# Patient Record
Sex: Female | Born: 1955 | Race: White | Hispanic: No | Smoking: Never smoker
Health system: Southern US, Community
[De-identification: ages and names within clinical notes are randomized; demographics above are authoritative.]

## PROBLEM LIST (undated history)

## (undated) DIAGNOSIS — C801 Malignant (primary) neoplasm, unspecified: Secondary | ICD-10-CM

## (undated) DIAGNOSIS — I10 Essential (primary) hypertension: Secondary | ICD-10-CM

## (undated) DIAGNOSIS — F329 Major depressive disorder, single episode, unspecified: Secondary | ICD-10-CM

## (undated) DIAGNOSIS — G459 Transient cerebral ischemic attack, unspecified: Secondary | ICD-10-CM

## (undated) DIAGNOSIS — M8580 Other specified disorders of bone density and structure, unspecified site: Secondary | ICD-10-CM

## (undated) DIAGNOSIS — F1910 Other psychoactive substance abuse, uncomplicated: Secondary | ICD-10-CM

## (undated) DIAGNOSIS — H9190 Unspecified hearing loss, unspecified ear: Secondary | ICD-10-CM

## (undated) DIAGNOSIS — F419 Anxiety disorder, unspecified: Secondary | ICD-10-CM

## (undated) DIAGNOSIS — F32A Depression, unspecified: Secondary | ICD-10-CM

## (undated) HISTORY — PX: BUNIONECTOMY: SHX129

## (undated) HISTORY — DX: Transient cerebral ischemic attack, unspecified: G45.9

## (undated) HISTORY — DX: Major depressive disorder, single episode, unspecified: F32.9

## (undated) HISTORY — DX: Other specified disorders of bone density and structure, unspecified site: M85.80

## (undated) HISTORY — DX: Anxiety disorder, unspecified: F41.9

## (undated) HISTORY — DX: Other psychoactive substance abuse, uncomplicated: F19.10

## (undated) HISTORY — DX: Depression, unspecified: F32.A

## (undated) HISTORY — PX: BREAST ENHANCEMENT SURGERY: SHX7

## (undated) HISTORY — DX: Malignant (primary) neoplasm, unspecified: C80.1

## (undated) HISTORY — PX: TONSILLECTOMY: SUR1361

## (undated) HISTORY — DX: Unspecified hearing loss, unspecified ear: H91.90

## (undated) HISTORY — DX: Essential (primary) hypertension: I10

## (undated) HISTORY — PX: NOSE SURGERY: SHX723

---

## 1986-07-10 HISTORY — PX: AUGMENTATION MAMMAPLASTY: SUR837

## 2000-02-10 ENCOUNTER — Encounter: Admission: RE | Admit: 2000-02-10 | Discharge: 2000-02-10 | Payer: Self-pay | Admitting: *Deleted

## 2000-02-10 ENCOUNTER — Encounter: Payer: Self-pay | Admitting: *Deleted

## 2000-08-13 ENCOUNTER — Encounter (HOSPITAL_COMMUNITY): Payer: Self-pay | Admitting: Dentistry

## 2000-08-13 ENCOUNTER — Observation Stay (HOSPITAL_COMMUNITY): Admission: AD | Admit: 2000-08-13 | Discharge: 2000-08-14 | Payer: Self-pay | Admitting: Family Medicine

## 2001-04-07 ENCOUNTER — Emergency Department (HOSPITAL_COMMUNITY): Admission: EM | Admit: 2001-04-07 | Discharge: 2001-04-07 | Payer: Self-pay

## 2002-02-20 ENCOUNTER — Encounter: Admission: RE | Admit: 2002-02-20 | Discharge: 2002-02-20 | Payer: Self-pay | Admitting: Obstetrics and Gynecology

## 2002-02-20 ENCOUNTER — Encounter: Payer: Self-pay | Admitting: Obstetrics and Gynecology

## 2002-08-21 ENCOUNTER — Inpatient Hospital Stay (HOSPITAL_COMMUNITY): Admission: EM | Admit: 2002-08-21 | Discharge: 2002-08-23 | Payer: Self-pay

## 2002-09-19 ENCOUNTER — Inpatient Hospital Stay (HOSPITAL_COMMUNITY): Admission: EM | Admit: 2002-09-19 | Discharge: 2002-09-23 | Payer: Self-pay | Admitting: Psychiatry

## 2002-10-30 ENCOUNTER — Inpatient Hospital Stay (HOSPITAL_COMMUNITY): Admission: EM | Admit: 2002-10-30 | Discharge: 2002-11-03 | Payer: Self-pay | Admitting: Psychiatry

## 2002-11-14 ENCOUNTER — Inpatient Hospital Stay (HOSPITAL_COMMUNITY): Admission: AD | Admit: 2002-11-14 | Discharge: 2002-11-17 | Payer: Self-pay | Admitting: Family Medicine

## 2003-03-10 ENCOUNTER — Encounter: Payer: Self-pay | Admitting: Obstetrics and Gynecology

## 2003-03-10 ENCOUNTER — Encounter: Admission: RE | Admit: 2003-03-10 | Discharge: 2003-03-10 | Payer: Self-pay | Admitting: Obstetrics and Gynecology

## 2004-03-17 ENCOUNTER — Encounter: Admission: RE | Admit: 2004-03-17 | Discharge: 2004-03-17 | Payer: Self-pay | Admitting: Obstetrics and Gynecology

## 2004-05-23 ENCOUNTER — Ambulatory Visit: Payer: Self-pay | Admitting: Family Medicine

## 2004-05-26 ENCOUNTER — Encounter: Admission: RE | Admit: 2004-05-26 | Discharge: 2004-05-26 | Payer: Self-pay | Admitting: Internal Medicine

## 2005-01-16 ENCOUNTER — Ambulatory Visit: Payer: Self-pay | Admitting: Family Medicine

## 2005-03-30 ENCOUNTER — Encounter: Admission: RE | Admit: 2005-03-30 | Discharge: 2005-03-30 | Payer: Self-pay | Admitting: Obstetrics and Gynecology

## 2005-04-25 ENCOUNTER — Ambulatory Visit: Payer: Self-pay | Admitting: Family Medicine

## 2005-04-28 ENCOUNTER — Emergency Department (HOSPITAL_COMMUNITY): Admission: EM | Admit: 2005-04-28 | Discharge: 2005-04-28 | Payer: Self-pay | Admitting: Emergency Medicine

## 2005-05-01 ENCOUNTER — Ambulatory Visit (HOSPITAL_COMMUNITY): Admission: RE | Admit: 2005-05-01 | Discharge: 2005-05-01 | Payer: Self-pay | Admitting: Otolaryngology

## 2005-05-01 ENCOUNTER — Ambulatory Visit (HOSPITAL_BASED_OUTPATIENT_CLINIC_OR_DEPARTMENT_OTHER): Admission: RE | Admit: 2005-05-01 | Discharge: 2005-05-01 | Payer: Self-pay | Admitting: Otolaryngology

## 2005-05-04 ENCOUNTER — Ambulatory Visit: Payer: Self-pay | Admitting: Family Medicine

## 2005-05-11 ENCOUNTER — Emergency Department (HOSPITAL_COMMUNITY): Admission: EM | Admit: 2005-05-11 | Discharge: 2005-05-11 | Payer: Self-pay | Admitting: Emergency Medicine

## 2005-05-22 ENCOUNTER — Ambulatory Visit: Payer: Self-pay | Admitting: Family Medicine

## 2005-06-05 ENCOUNTER — Ambulatory Visit: Payer: Self-pay | Admitting: Family Medicine

## 2005-06-21 ENCOUNTER — Ambulatory Visit: Payer: Self-pay | Admitting: Family Medicine

## 2005-11-29 ENCOUNTER — Ambulatory Visit: Payer: Self-pay | Admitting: Internal Medicine

## 2005-12-05 ENCOUNTER — Encounter: Payer: Self-pay | Admitting: Family Medicine

## 2005-12-05 ENCOUNTER — Ambulatory Visit: Payer: Self-pay | Admitting: Family Medicine

## 2005-12-05 ENCOUNTER — Other Ambulatory Visit: Admission: RE | Admit: 2005-12-05 | Discharge: 2005-12-05 | Payer: Self-pay | Admitting: Family Medicine

## 2005-12-13 ENCOUNTER — Ambulatory Visit: Payer: Self-pay | Admitting: Family Medicine

## 2006-03-14 ENCOUNTER — Ambulatory Visit: Payer: Self-pay | Admitting: Family Medicine

## 2006-04-02 ENCOUNTER — Encounter: Admission: RE | Admit: 2006-04-02 | Discharge: 2006-04-02 | Payer: Self-pay | Admitting: Family Medicine

## 2007-04-08 ENCOUNTER — Encounter: Admission: RE | Admit: 2007-04-08 | Discharge: 2007-04-08 | Payer: Self-pay | Admitting: Family Medicine

## 2007-04-11 ENCOUNTER — Encounter (INDEPENDENT_AMBULATORY_CARE_PROVIDER_SITE_OTHER): Payer: Self-pay | Admitting: *Deleted

## 2007-10-22 ENCOUNTER — Ambulatory Visit: Payer: Self-pay | Admitting: Family Medicine

## 2007-10-22 ENCOUNTER — Other Ambulatory Visit: Admission: RE | Admit: 2007-10-22 | Discharge: 2007-10-22 | Payer: Self-pay | Admitting: Family Medicine

## 2007-10-22 ENCOUNTER — Encounter: Payer: Self-pay | Admitting: Family Medicine

## 2007-10-22 DIAGNOSIS — I10 Essential (primary) hypertension: Secondary | ICD-10-CM | POA: Insufficient documentation

## 2007-10-23 ENCOUNTER — Encounter (INDEPENDENT_AMBULATORY_CARE_PROVIDER_SITE_OTHER): Payer: Self-pay | Admitting: *Deleted

## 2007-10-24 ENCOUNTER — Encounter (INDEPENDENT_AMBULATORY_CARE_PROVIDER_SITE_OTHER): Payer: Self-pay | Admitting: *Deleted

## 2007-10-28 ENCOUNTER — Encounter (INDEPENDENT_AMBULATORY_CARE_PROVIDER_SITE_OTHER): Payer: Self-pay | Admitting: *Deleted

## 2007-10-28 ENCOUNTER — Ambulatory Visit: Payer: Self-pay | Admitting: Gastroenterology

## 2007-11-04 ENCOUNTER — Encounter: Payer: Self-pay | Admitting: Gastroenterology

## 2007-11-04 ENCOUNTER — Ambulatory Visit: Payer: Self-pay | Admitting: Gastroenterology

## 2007-11-04 ENCOUNTER — Encounter: Payer: Self-pay | Admitting: Family Medicine

## 2007-11-05 ENCOUNTER — Encounter: Payer: Self-pay | Admitting: Gastroenterology

## 2007-11-08 ENCOUNTER — Telehealth: Payer: Self-pay | Admitting: Gastroenterology

## 2007-11-11 ENCOUNTER — Ambulatory Visit: Payer: Self-pay | Admitting: Family Medicine

## 2007-11-19 ENCOUNTER — Telehealth (INDEPENDENT_AMBULATORY_CARE_PROVIDER_SITE_OTHER): Payer: Self-pay | Admitting: *Deleted

## 2007-11-19 LAB — CONVERTED CEMR LAB
ALT: 23 units/L (ref 0–35)
Albumin: 4.2 g/dL (ref 3.5–5.2)
Alkaline Phosphatase: 53 units/L (ref 39–117)
Bilirubin, Direct: 0.1 mg/dL (ref 0.0–0.3)
GGT: 96 units/L — ABNORMAL HIGH (ref 7–51)

## 2007-11-23 LAB — CONVERTED CEMR LAB
Hep A IgM: NEGATIVE
Hep B C IgM: NEGATIVE

## 2007-11-25 ENCOUNTER — Encounter (INDEPENDENT_AMBULATORY_CARE_PROVIDER_SITE_OTHER): Payer: Self-pay | Admitting: *Deleted

## 2007-12-03 ENCOUNTER — Ambulatory Visit: Payer: Self-pay | Admitting: Gastroenterology

## 2007-12-03 DIAGNOSIS — R933 Abnormal findings on diagnostic imaging of other parts of digestive tract: Secondary | ICD-10-CM | POA: Insufficient documentation

## 2008-02-26 ENCOUNTER — Ambulatory Visit: Payer: Self-pay | Admitting: Family Medicine

## 2008-04-08 ENCOUNTER — Encounter: Admission: RE | Admit: 2008-04-08 | Discharge: 2008-04-08 | Payer: Self-pay | Admitting: Family Medicine

## 2008-04-13 ENCOUNTER — Encounter (INDEPENDENT_AMBULATORY_CARE_PROVIDER_SITE_OTHER): Payer: Self-pay | Admitting: *Deleted

## 2008-12-02 ENCOUNTER — Telehealth: Payer: Self-pay | Admitting: Family Medicine

## 2009-04-05 ENCOUNTER — Telehealth: Payer: Self-pay | Admitting: Family Medicine

## 2009-04-09 ENCOUNTER — Encounter: Admission: RE | Admit: 2009-04-09 | Discharge: 2009-04-09 | Payer: Self-pay | Admitting: Family Medicine

## 2009-04-12 ENCOUNTER — Telehealth: Payer: Self-pay | Admitting: Family Medicine

## 2009-04-21 ENCOUNTER — Ambulatory Visit: Payer: Self-pay | Admitting: Family Medicine

## 2009-04-21 DIAGNOSIS — R809 Proteinuria, unspecified: Secondary | ICD-10-CM | POA: Insufficient documentation

## 2009-04-23 ENCOUNTER — Telehealth: Payer: Self-pay | Admitting: Family Medicine

## 2009-04-23 LAB — CONVERTED CEMR LAB
ALT: 43 units/L — ABNORMAL HIGH (ref 0–35)
Alkaline Phosphatase: 68 units/L (ref 39–117)
BUN: 6 mg/dL (ref 6–23)
Basophils Absolute: 0 10*3/uL (ref 0.0–0.1)
Basophils Relative: 0.7 % (ref 0.0–3.0)
Eosinophils Absolute: 0.1 10*3/uL (ref 0.0–0.7)
Eosinophils Relative: 3.9 % (ref 0.0–5.0)
GFR calc non Af Amer: 79.97 mL/min (ref >60)
Lymphocytes Relative: 32.9 % (ref 12.0–46.0)
MCV: 110.7 fL — ABNORMAL HIGH (ref 78.0–100.0)
Monocytes Absolute: 0.6 10*3/uL (ref 0.1–1.0)
Neutro Abs: 1.2 10*3/uL — ABNORMAL LOW (ref 1.4–7.7)
Sodium: 135 meq/L (ref 135–145)
Total Bilirubin: 0.7 mg/dL (ref 0.3–1.2)
Total CHOL/HDL Ratio: 2
Triglycerides: 72 mg/dL (ref 0.0–149.0)
VLDL: 14.4 mg/dL (ref 0.0–40.0)

## 2009-04-30 ENCOUNTER — Encounter: Payer: Self-pay | Admitting: Family Medicine

## 2009-10-22 ENCOUNTER — Ambulatory Visit: Payer: Self-pay | Admitting: Family Medicine

## 2009-10-22 ENCOUNTER — Telehealth (INDEPENDENT_AMBULATORY_CARE_PROVIDER_SITE_OTHER): Payer: Self-pay | Admitting: *Deleted

## 2009-10-22 DIAGNOSIS — R300 Dysuria: Secondary | ICD-10-CM | POA: Insufficient documentation

## 2009-10-22 LAB — CONVERTED CEMR LAB
Glucose, Urine, Semiquant: NEGATIVE
Ketones, urine, test strip: NEGATIVE
Urobilinogen, UA: 0.2
pH: 5

## 2009-10-23 ENCOUNTER — Encounter: Payer: Self-pay | Admitting: Family Medicine

## 2009-10-25 ENCOUNTER — Telehealth (INDEPENDENT_AMBULATORY_CARE_PROVIDER_SITE_OTHER): Payer: Self-pay | Admitting: *Deleted

## 2009-11-02 ENCOUNTER — Encounter (INDEPENDENT_AMBULATORY_CARE_PROVIDER_SITE_OTHER): Payer: Self-pay | Admitting: *Deleted

## 2009-11-02 ENCOUNTER — Other Ambulatory Visit: Admission: RE | Admit: 2009-11-02 | Discharge: 2009-11-02 | Payer: Self-pay | Admitting: Family Medicine

## 2009-11-02 ENCOUNTER — Ambulatory Visit: Payer: Self-pay | Admitting: Family Medicine

## 2009-11-02 LAB — HM PAP SMEAR

## 2009-11-04 ENCOUNTER — Encounter (INDEPENDENT_AMBULATORY_CARE_PROVIDER_SITE_OTHER): Payer: Self-pay | Admitting: *Deleted

## 2009-11-09 ENCOUNTER — Ambulatory Visit: Payer: Self-pay | Admitting: Family Medicine

## 2009-11-10 ENCOUNTER — Telehealth (INDEPENDENT_AMBULATORY_CARE_PROVIDER_SITE_OTHER): Payer: Self-pay | Admitting: *Deleted

## 2009-11-10 LAB — CONVERTED CEMR LAB
ALT: 37 units/L — ABNORMAL HIGH (ref 0–35)
AST: 71 units/L — ABNORMAL HIGH (ref 0–37)
Albumin: 5.1 g/dL (ref 3.5–5.2)
BUN: 4 mg/dL — ABNORMAL LOW (ref 6–23)
CO2: 28 meq/L (ref 19–32)
Calcium: 10.2 mg/dL (ref 8.4–10.5)
Direct LDL: 135.9 mg/dL
Eosinophils Relative: 3.1 % (ref 0.0–5.0)
GFR calc non Af Amer: 93.09 mL/min (ref >60)
Glucose, Bld: 81 mg/dL (ref 70–99)
HCT: 35.3 % — ABNORMAL LOW (ref 36.0–46.0)
Monocytes Absolute: 0.5 10*3/uL (ref 0.1–1.0)
Monocytes Relative: 10.4 % (ref 3.0–12.0)
Neutro Abs: 2.8 10*3/uL (ref 1.4–7.7)
Potassium: 5.2 meq/L — ABNORMAL HIGH (ref 3.5–5.1)
RBC: 3.23 M/uL — ABNORMAL LOW (ref 3.87–5.11)
RDW: 13.4 % (ref 11.5–14.6)
Sodium: 132 meq/L — ABNORMAL LOW (ref 135–145)
Total CHOL/HDL Ratio: 2
Total Protein: 7.8 g/dL (ref 6.0–8.3)
VLDL: 11.4 mg/dL (ref 0.0–40.0)
WBC: 4.4 10*3/uL — ABNORMAL LOW (ref 4.5–10.5)

## 2009-11-11 ENCOUNTER — Ambulatory Visit: Payer: Self-pay | Admitting: Family Medicine

## 2009-11-15 ENCOUNTER — Encounter (INDEPENDENT_AMBULATORY_CARE_PROVIDER_SITE_OTHER): Payer: Self-pay | Admitting: *Deleted

## 2010-04-14 ENCOUNTER — Encounter: Admission: RE | Admit: 2010-04-14 | Discharge: 2010-04-14 | Payer: Self-pay | Admitting: Family Medicine

## 2010-07-29 ENCOUNTER — Encounter: Payer: Self-pay | Admitting: Family Medicine

## 2010-08-07 LAB — CONVERTED CEMR LAB
BUN: 5 mg/dL — ABNORMAL LOW (ref 6–23)
Basophils Absolute: 0 10*3/uL (ref 0.0–0.1)
Creatinine, Ser: 0.7 mg/dL (ref 0.4–1.2)
Direct LDL: 115.1 mg/dL
Eosinophils Absolute: 0.1 10*3/uL (ref 0.0–0.7)
Eosinophils Relative: 2.4 % (ref 0.0–5.0)
Glucose, Bld: 74 mg/dL (ref 70–99)
HCT: 39.6 % (ref 36.0–46.0)
HDL: 138.2 mg/dL (ref >39.0)
Lymphocytes Relative: 15.4 % (ref 12.0–46.0)
Monocytes Relative: 5.9 % (ref 3.0–12.0)
Neutro Abs: 3.2 10*3/uL (ref 1.4–7.7)
Neutrophils Relative %: 76.3 % (ref 43.0–77.0)
Nitrite: NEGATIVE
Potassium: 4.7 meq/L (ref 3.5–5.1)
Sodium: 132 meq/L — ABNORMAL LOW (ref 135–145)
Specific Gravity, Urine: <1.005
Total Bilirubin: 0.8 mg/dL (ref 0.3–1.2)
VLDL: 12 mg/dL (ref 0–40)
WBC Urine, dipstick: NEGATIVE
WBC: 4.3 10*3/uL — ABNORMAL LOW (ref 4.5–10.5)
pH: 7

## 2010-08-09 NOTE — Progress Notes (Signed)
Summary: Lab Results   Phone Note Outgoing Call   Call placed by: Army Fossa CMA,  October 25, 2009 1:35 PM Summary of Call: Regarding lab results, LMTCB:  + UTI treated with Cipro Signed by Loreen Freud DO on 10/25/2009 at 12:58 PM   Follow-up for Phone Call        Pt is aware. Army Fossa CMA  October 26, 2009 4:01 PM

## 2010-08-09 NOTE — Letter (Signed)
Summary: Results Follow up Letter   at [place]  [address] [place], [place] [address]   Phone: [phone]  Fax: [phone]    11/04/2009 MRN: [id number]  [name] [address] [place], [place]  [address]  Dear [name],  The following are the results of your recent test(s):  Test         Result    Pap Smear:        Normal __X___  Not Normal _____ Comments: ______________________________________________________ Cholesterol: LDL(Bad cholesterol):         Your goal is less than:         HDL (Good cholesterol):       Your goal is more than: Comments:  ______________________________________________________ Mammogram:        Normal _____  Not Normal _____ Comments:  ___________________________________________________________________ Hemoccult:        Normal _____  Not normal _______ Comments:    _____________________________________________________________________ Other Tests:    We routinely do not discuss normal results over the telephone.  If you desire a copy of the results, or you have any questions about this information we can discuss them at your next office visit.   Sincerely,    Army Fossa CMA  November 04, 2009 2:30 PM

## 2010-08-09 NOTE — Progress Notes (Signed)
Summary: PT GOING TO LAB  Phone Note Call from Patient   Caller: Patient Summary of Call: pt left VM that she thinks that she may have a UTI and would like to drop sample off and get a antibiotics rx. left message to call  office.....................Marland KitchenFelecia Deloach CMA  October 22, 2009 9:31 AM   Follow-up for Phone Call        PATIENT TRIED TO LEAVE A URINE SAMPLE IN A PILL CONTAINER---TOLD HER SHE NEEDED TO GO TO LAB TO LEAVE A SAMPLE IN A STERILE CONTAINER---SAID SHE COULD NOT EVEN GO TO LAB, THAT SHE HAD TO GET TO WORK--SAID SHE WOULD RETURN THIS AFTERNOON IF SHE COULD--WAS TOLD THAT, IF SHE WANTED ANTIBIOTIC, SHE WOULD NEED TO SEE DOCTOR TODAY; IF NOT, SAMPLE WOULD BE CULTURED AND SHE WOULD GET A CALL NEXT WEEK WITH RESULTS   Follow-up by: Jerolyn Shin,  October 22, 2009 3:51 PM  Additional Follow-up for Phone Call Additional follow up Details #1::        PATIENT CAME IN AT 3:40PM AND WAS ADDED TO THE LAB SCHEDULE FOR A URINE CULTURE--LEFT CONTACT PHONE = [phone]   Additional Follow-up by: Jerolyn Shin,  October 22, 2009 3:56 PM    Additional Follow-up for Phone Call Additional follow up Details #2::    pt had OV due to UA results...............Marland KitchenFelecia Deloach CMA  October 22, 2009 5:29 PM

## 2010-08-09 NOTE — Progress Notes (Signed)
Summary: Lab Results (lmom 5/4,5/6, 6/7)  Phone Note Outgoing Call   Call placed by: Army Fossa CMA,  Nov 10, 2009 10:27 AM Reason for Call: Discuss lab or test results Summary of Call: Regarding lab results, tried to call pt no answer, no machine:  overall good-----HDL ( good cholesterol) excellent----  protects heart LDL ( bad cholesterol)  ----ideally should be < 100 LFT elevated----any alcohol or tylenol or other otc meds--herbal meds?----- if yes stop--- recheck 2 weeks   790.4  hep, ggt, acute hep, bmp Signed by Loreen Freud DO on 11/10/2009 at 10:16 AM  Follow-up for Phone Call        [facility]. Army Fossa CMA  Nov 12, 2009 8:52 AM left message on machine for pt to return call Shary Decamp  Nov 13, 2009 11:47 AM    Additional Follow-up for Phone Call Additional follow up Details #1::        mailed pt a letter to contact office. Army Fossa CMA  Nov 15, 2009 11:43 AM

## 2010-08-09 NOTE — Letter (Signed)
Summary: Unable To Reach-Consult Scheduled  [facility] at [place]  [address] [place], [place] [address]   Phone: [phone]  Fax: [phone]    11/15/2009 MRN: [id number]    Dear Ms. [name],   We have been unable to reach you by phone.  Please contact our office with an updated phone number.      Thank you,  Army Fossa CMA  Nov 15, 2009 11:44 AM

## 2010-08-09 NOTE — Assessment & Plan Note (Signed)
Summary: pap smear/kdc   Vital Signs:  Patient profile:   55 year old female Weight:      108 pounds Pulse rate:   82 / minute Pulse rhythm:   regular BP sitting:   118 / 80  (left arm) Cuff size:   regular  Vitals Entered By: Army Fossa CMA (November 02, 2009 10:14 AM) CC: Pap only   History of Present Illness: Pt here for pap only----  no labs pt is not fasting.    Preventive Screening-Counseling & Management  Alcohol-Tobacco     Alcohol drinks/day: <1     Alcohol type: 2-3 beers on weekend     Alcohol Counseling: to STOP drinking     Smoking Status: never  Caffeine-Diet-Exercise     Caffeine use/day: 1     Does Patient Exercise: yes     Type of exercise: gym--treadmill, weights     Times/week: 7  Hep-HIV-STD-Contraception     HIV Risk: no     Dental Visit-last 6 months yes     Dental Care Counseling: to seek dental care; no dental care within six months     SBE monthly: no     SBE Education/Counseling: to perform regular SBE  Safety-Violence-Falls     Seat Belt Use: 100      Drug Use:  never.    Current Medications (verified): 1)  Zestril 10 Mg Tabs (Lisinopril) .Marland Kitchen.. 1 By Mouth Once Daily  Allergies: 1)  ! * Codiene  Past History:  Past Medical History: Last updated: 10/22/2007 Hypertension  Past Surgical History: Last updated: 10/22/2007 nasal surgery -- epistaxis  Family History: Last updated: 11/02/2009 Family History High cholesterol Family History Hypertension No FH of Colon Cancer: Family History Kidney disease---mom  Social History: Last updated: 10/22/2007 Occupation:[employer] Never Smoked Alcohol use-yes Drug use-no Regular exercise-yes  Risk Factors: Alcohol Use: <1 (11/02/2009) Caffeine Use: 1 (11/02/2009) Exercise: yes (11/02/2009)  Risk Factors: Smoking Status: never (11/02/2009)  Family History: Reviewed history from 12/03/2007 and no changes required. Family History High  cholesterol Family History Hypertension No FH of Colon Cancer: Family History Kidney disease---mom  Social History: Reviewed history from 10/22/2007 and no changes required. Occupation:[employer] Never Smoked Alcohol use-yes Drug use-no Regular exercise-yes Dental Care w/in 6 mos.:  yes Drug Use:  never  Review of Systems      See HPI  Physical Exam  General:  Well-developed,well-nourished,in no acute distress; alert,appropriate and cooperative throughout examination Neck:  No deformities, masses, or tenderness noted.no carotid bruits.   Chest Wall:  No deformities, masses, or tenderness noted. Breasts:  No mass, nodules, thickening, tenderness, bulging, retraction, inflamation, nipple discharge or skin changes noted.   Lungs:  Normal respiratory effort, chest expands symmetrically. Lungs are clear to auscultation, no crackles or wheezes. Heart:  normal rate and no murmur.   Abdomen:  Bowel sounds positive,abdomen soft and non-tender without masses, organomegaly or hernias noted. Rectal:  No external abnormalities noted. Normal sphincter tone. No rectal masses or tenderness. Genitalia:  Pelvic Exam:        External: normal female genitalia without lesions or masses        Vagina: normal without lesions or masses        Cervix: normal without lesions or masses        Adnexa: normal bimanual exam without masses or fullness        Uterus: normal by palpation  Pap smear: performed Extremities:  No clubbing, cyanosis, edema, or deformity noted with normal full range of motion of all joints.   Skin:  Intact without suspicious lesions or rashes Psych:  Oriented X3 and normally interactive.     Impression & Recommendations:  Problem # 1:  ROUTINE GYNECOLOGICAL EXAMINATION (ICD-V72.31) ghm utd check fasting labs  Problem # 2:  HYPERTENSION (ICD-401.9)  Her updated medication list for this problem includes:    Zestril 10 Mg Tabs (Lisinopril)  .Marland Kitchen... 1 by mouth once daily  BP today: 118/80 Prior BP: 130/86 (10/22/2009)  Labs Reviewed: K+: 4.3 (04/21/2009) Creat: : 0.8 (04/21/2009)   Chol: 277 (04/21/2009)   HDL: 111.60 (04/21/2009)   LDL: DEL (10/22/2007)   TG: 72.0 (04/21/2009)  Complete Medication List: 1)  Zestril 10 Mg Tabs (Lisinopril) .Marland Kitchen.. 1 by mouth once daily  Other Orders: TwinRix 1ml ( Hep A&B Adult dose) ([address]) Admin 1st Vaccine (60454)  Patient Instructions: 1)  v70.0  401.9   cbcd, hep, lipid, tsh, bmp--fasting labs        Immunizations Administered:  TwinRix # 1:    Vaccine Type: TwinRix    Site: left deltoid    Mfr: GlaxoSmithKline    Dose: 1.0 ml    Route: IM    Given by: Army Fossa CMA    Exp. Date: 09/18/2011    Lot #: [id number]   Immunizations Administered:  TwinRix # 1:    Vaccine Type: TwinRix    Site: left deltoid    Mfr: GlaxoSmithKline    Dose: 1.0 ml    Route: IM    Given by: Army Fossa CMA    Exp. Date: 09/18/2011    Lot #: [id number]

## 2010-08-09 NOTE — Letter (Signed)
Summary: [facility]: Immunoassay Fecal Occult Blood (iFOB) Order Form  [facility] at [place]  [address] [place], [place] [address]   Phone: [phone]  Fax: [phone]      [facility]: Immunoassay Fecal Occult Blood (iFOB) Order Form   November 02, 2009 MRN: [id number]   MARYLYNNE KEELIN 1955/08/26   Physicican Name:______Yvonne Lowne,DO___________________  Diagnosis Code:____v76.51______________________      Army Fossa CMA

## 2010-08-09 NOTE — Assessment & Plan Note (Signed)
Summary: PER RIGINA, PT HAS UTI///SPH   Vital Signs:  Patient profile:   55 year old female Weight:      109 pounds Pulse rate:   80 / minute Pulse rhythm:   regular BP sitting:   130 / 86  (left arm) Cuff size:   regular  Vitals Entered By: Army Fossa CMA (October 22, 2009 4:06 PM) CC: Pt here for UTI- UA is in lab visit. She is urinating more frequently and burning. No discharge. , Dysuria Comments Culture has been sent. She had a large amount of blood.   History of Present Illness:  Dysuria      This is a 55 year old woman who presents with Dysuria.  The symptoms began 3 days ago.  The patient complains of burning with urination and urinary frequency, but denies urgency, hematuria, vaginal discharge, vaginal itching, vaginal sores, and penile discharge.  The patient denies the following associated symptoms: nausea, vomiting, fever, shaking chills, flank pain, abdominal pain, back pain, pelvic pain, and arthralgias.  The patient denies the following risk factors: diabetes, prior antibiotics, immunosuppression, history of GU anomaly, history of pyelonephritis, pregnancy, history of STD, and analgesic abuse.  History is significant for no urinary tract problems.    Allergies: 1)  ! * Codiene  Past History:  Past medical, surgical, family and social histories (including risk factors) reviewed for relevance to current acute and chronic problems.  Past Medical History: Reviewed history from 10/22/2007 and no changes required. Hypertension  Past Surgical History: Reviewed history from 10/22/2007 and no changes required. nasal surgery -- epistaxis  Family History: Reviewed history from 12/03/2007 and no changes required. Family History High cholesterol Family History Hypertension No FH of Colon Cancer:  Social History: Reviewed history from 10/22/2007 and no changes required. Occupation:[employer] Never Smoked Alcohol use-yes Drug  use-no Regular exercise-yes  Review of Systems      See HPI  Physical Exam  General:  Well-developed,well-nourished,in no acute distress; alert,appropriate and cooperative throughout examination Abdomen:  + suprapubic tenderness no flank pain Psych:  Oriented X3 and normally interactive.     Impression & Recommendations:  Problem # 1:  DYSURIA (ICD-788.1)  Her updated medication list for this problem includes:    Cipro 500 Mg Tabs (Ciprofloxacin hcl) .Marland Kitchen... 1 by mouth two times a day  Orders: UA Dipstick w/o Micro (manual) (04540)  Encouraged to push clear liquids, get enough rest, and take acetaminophen as needed. To be seen in 10 days if no improvement, sooner if worse.  Complete Medication List: 1)  Zestril 10 Mg Tabs (Lisinopril) .Marland Kitchen.. 1 by mouth once daily 2)  Cipro 500 Mg Tabs (Ciprofloxacin hcl) .Marland Kitchen.. 1 by mouth two times a day Prescriptions: CIPRO 500 MG TABS (CIPROFLOXACIN HCL) 1 by mouth two times a day  #10 x 0   Entered and Authorized by:   Loreen Freud DO   Signed by:   Loreen Freud DO on 10/22/2009   Method used:   Electronically to        [facility] (retail)       [address]       [place], [place]  [address]       Ph: [phone]       Fax: [phone]   RxID:   [id number]

## 2010-08-25 NOTE — Letter (Signed)
Summary: [facility]  [facility]   Imported By: Maryln Gottron 08/15/2010 12:58:48  _____________________________________________________________________  External Attachment:    Type:   Image     Comment:   External Document

## 2010-10-13 ENCOUNTER — Other Ambulatory Visit: Payer: Self-pay | Admitting: Family Medicine

## 2010-11-25 NOTE — H&P (Signed)
[name], [name]                          ACCOUNT NO.:  [id number]   MEDICAL RECORD NO.:  [id number]                   PATIENT TYPE:  IPS   LOCATION:  0508                                 FACILITY:  [facility]   PHYSICIAN:  Geoffery Lyons, M.D.                   DATE OF BIRTH:  07-15-1955   DATE OF ADMISSION:  10/30/2002  DATE OF DISCHARGE:                         PSYCHIATRIC ADMISSION ASSESSMENT   IDENTIFYING INFORMATION:  A 55 year old divorced white female, voluntarily  admitted on October 30, 2002.   HISTORY OF PRESENT ILLNESS:  The patient presents with a history of alcohol  abuse.  She relapsed after 2 weeks of being detoxed in February 2004.  She  states she came for help because she has been having problems with vomiting  and a decreased appetite with a 10 pound weight loss over the past 6 months.  She states she drinks when she is stressed.  She has been having problems  with her ex-husband and no job, although she is going to school for medical  coding.  She is motivated to stay sober.  She wants to attend ADS program.  She denies any depression, feels anxious.  No suicidal or homicidal ideation  or psychosis, currently feeling nauseated.   PAST PSYCHIATRIC HISTORY:  Was here 2 months ago for alcohol detox, first  detox, and no suicide attempt.   SOCIAL HISTORY:  She is a 55 year old divorced white female, divorced for 10  years, has 2 children ages 56 and 49.  She lives with her children.  She has  joint custody with her husband.  Unemployed for 2 years.  She was a Advertising account executive.  No legal problems.   FAMILY HISTORY:  Unknown.   ALCOHOL DRUG HISTORY:  She is a nonsmoker.  Her last drink was on Thursday  a.m. with no blackouts.  She has been drinking since the age of 36.  No  DUIs, no drug use.   PAST MEDICAL HISTORY:  Primary care Herley Bernardini is Dr. Ruthine Dose at [facility].  Medical problems are hypertension.   MEDICATIONS:  Reports she has been on Atenolol but has  been noncompliant  with the medication.  Her pharmacy was called to resume medications, but the  patient has not been on medication since January 2004.  The patient reports  that she took a benzodiazepine to help her sleep prior.   DRUG ALLERGIES:  CODEINE.   PHYSICAL EXAMINATION:  Done at [facility] Emergency Department.  The  patient appears as a well-nourished female in no acute distress, nicely  dressed.  CBC:  RBC was 3.68, MCV was 104, neutrophils were 84, lymphocytes 8.   MENTAL STATUS EXAM:  She is an alert, nicely dressed, middle-aged female,  cooperative, fair eye contact.  Speech is good.  Mood is anxious and the  patient feels nausea.  She also appears somewhat anxious.  Thought processes  are coherent with no evidence of psychosis.  Cognitive function intact.  Judgment is fair, insight is limited.   ADMISSION DIAGNOSES:   AXIS I:  Alcohol abuse, rule out dependence.   AXIS II:  Deferred.   AXIS III:  Hypertension.   AXIS IV:  Problems with primary support group, occupation and other  psychosocial problems.   AXIS V:  Current is 35, estimated this past year 77.   PLAN:  Voluntary admission for alcohol abuse and dependence.  Contract for  safety, check every 15 minutes.  Will initiate the low-dose Librium to detox  safely, contact pharmacy to clarify medications.  Encourage fluids.  Will  offer an antidepressant to decrease depressive and anxious symptoms.  The  patient remains uncertain as to whether she needs medications.  Medication  compliance was discussed.  Remain alcohol free.  To attend ADS.   TENTATIVE LENGTH OF CARE:  3-4 days.      Landry Corporal, N.P.                       Geoffery Lyons, M.D.    JO/MEDQ  D:  10/31/2002  T:  11/03/2002  Job:  [id number]

## 2010-11-25 NOTE — H&P (Signed)
NAME:  [name], [name]                          ACCOUNT NO.:  [id number]   MEDICAL RECORD NO.:  [id number]                   PATIENT TYPE:  EMS   LOCATION:  MAJO                                 FACILITY:  [facility]   PHYSICIAN:  John C. Madilyn Fireman, M.D.                 DATE OF BIRTH:  1956/01/23   DATE OF ADMISSION:  08/21/2002  DATE OF DISCHARGE:                                HISTORY & PHYSICAL   CHIEF COMPLAINT:  Vomiting blood.   HISTORY OF ILLNESS:  The patient is a 55 year old white female who presents  with intermittent vomiting for one week with blood seen in her vomitus for  two days, and more frank hematemesis in the last 24 hours.  She denies any  weakness, dizziness, lightheadedness, or near syncope.  She also denies any  melena, hematochezia, or abdominal pain, and has not had any prior history  of GI bleeding.  She vomited frank blood twice in the emergency room.   PAST MEDICAL HISTORY:  Hypertension.   SURGERIES:  Foot surgery x2, a broken arm repair as a child.   MEDICATIONS:  1. Atenolol 50 mg daily.  2. Hydrochlorothiazide 25 mg daily.  3. She does take ibuprofen about five times a week for headache.  4. She takes occasional Alka-Seltzer.   SOCIAL HISTORY:  The patient is divorced.  She is unemployed.  She has two  children.  She denies tobacco use, and states she drinks about two drinks 3-  4 nights a week.   FAMILY HISTORY:  Mother and father in generally good health.  No family  history of GI malignancy or peptic ulcer disease or chronic liver disease.   ALLERGIES:  CODEINE AND AMOXICILLIN.   PHYSICAL EXAMINATION:  GENERAL:  Well-developed, well-nourished, white  female in no acute distress.  There is an NG tube in the left nostril with  some dark red blood in it.  VITAL SIGNS:  Blood pressure 151/99, temperature 97.9, heart rate 90,  respirations 20.  HEENT:  Unremarkable.  HEART:  Regular rate and rhythm without murmur.  LUNGS:  Clear.  ABDOMEN:  Soft,  nondistended, with normoactive bowel sounds.  No  hepatosplenomegaly, mass, or guarding.   LABORATORY DATA:  PT 13.2, hemoglobin 13.5, hematocrit 39, platelets  230,000.  BUN 10, creatinine 0.9, SGOT 115, SGPT 67, bilirubin 2.4, alkaline  phosphatase 97, amylase 64.    IMPRESSION:  Upper gastrointestinal bleeding, non-destabilizing at present.   PLAN:  Will proceed with EGD.  The patient will probably need admission.                                               John C. Madilyn Fireman, M.D.    JCH/MEDQ  D:  08/21/2002  T:  08/21/2002  Job:  [id number]   cc:   Angelena Sole, M.D. [facility]

## 2010-11-25 NOTE — Op Note (Signed)
NAMEMAYLEEN, BORRERO                ACCOUNT NO.:  [id number]   MEDICAL RECORD NO.:  [id number]          PATIENT TYPE:  AMB   LOCATION:  DSC                          FACILITY:  [facility]   PHYSICIAN:  Christopher E. Ezzard Standing, M.D.DATE OF BIRTH:  1956/05/06   DATE OF PROCEDURE:  05/01/2005  DATE OF DISCHARGE:                                 OPERATIVE REPORT   PREOPERATIVE DIAGNOSIS:  Recurrent right-sided epistaxis.   POSTOPERATIVE DIAGNOSIS:  Recurrent right-sided epistaxis.   OPERATION:  Endoscopic cauterization of right nasal epistaxis.   SURGEON:  Kristine Garbe. Ezzard Standing, M.D.   ANESTHESIA:  General endotracheal anesthesia.   COMPLICATIONS:  None.   BRIEF CLINICAL NOTE:  Cherice Glennie is a 55 year old female who has had  recurrent right-sided nose bleeds for the last week.  She was seen in the  office initially six days ago and had an area __________ on the septum  cauterized but then had recurrent posterior bleeding and had a Merocel pack  placed.  She has continued to have some intermittent bleeding around the  Merocel pack and is taken to the operating room at this time for removal of  nasal packing and cauterization of epistaxis.  Of note, her hemoglobin at  the time of surgery is 12.9.   DESCRIPTION OF PROCEDURE:  After adequate endotracheal anesthesia, the  Merocel pack was removed.  There were a few areas of bleeding along the  anterior septum anteriorly and then also around the middle turbinate more  posteriorly.  The middle meatus was examined with a 30 degree scope and did  not find any obvious site of bleeding from the middle meatus.  The inferior  turbinate had a few areas of just small oozing from where the Merocel pack  was removed and these were cauterized with suction cautery.  There was also  some bleeding around the middle turbinate.  I could not adequately visualize  the superior nasal turbinate or high posteriorly or superiorly because of  the narrowness between  the middle turbinate and the septum.  I did not  identify any active bleeding coming from this region.  The area of bleeding  around the middle turbinate was cauterized with suction cautery as was the  anterior septal area also cauterized with suction cautery.  This completed  the procedure.  Brendia was awoken from anesthesia and transferred to the  recovery room postoperatively doing well.   DISPOSITION:  Shaniqwa is discharged home later this morning.  Will have her  follow up in my office in four days for recheck.  She is instructed not to  blow her nose for the next three days.  Instructed on Tylenol p.r.n. pain.  Will continue with her blood pressure medication, Atenolol.           ______________________________  Kristine Garbe. Ezzard Standing, M.D.     CEN/MEDQ  D:  05/01/2005  T:  05/01/2005  Job:  [id number]   cc:   Loreen Freud, M.D.  [address]  [place] [address]

## 2010-11-25 NOTE — Op Note (Signed)
NAME:  [name], [name]                          ACCOUNT NO.:  [id number]   MEDICAL RECORD NO.:  [id number]                   PATIENT TYPE:  EMS   LOCATION:  MAJO                                 FACILITY:  [facility]   PHYSICIAN:  John C. Madilyn Fireman, M.D.                 DATE OF BIRTH:  [birth year]   DATE OF PROCEDURE:  08/21/2002  DATE OF DISCHARGE:                                 OPERATIVE REPORT   PROCEDURE:  Esophagogastroduodenoscopy with cautery of bleeding vessel.   INDICATION FOR PROCEDURE:  Upper GI bleeding.   PROCEDURE IN DETAIL:  The patient was placed in the left lateral decubitus  position and placed on the pulse monitor with continuous low-flow oxygen  delivered by nasal cannula.  She was sedated with 100 mcg of IV fentanyl and  10 mg of IV Versed.  The Olympus video endoscope was advanced under direct  vision through the oropharynx and the esophagus.  The esophagus was straight  and of normal caliber at the squamocolumnar line at 38 cm.  There was active  bleeding near the GE junction.  I passed this area and went into the stomach  where there was a small amount of clotted blood in the stomach which was  suctioned away, and there were no underlying lesions.  The fundus, body,  antrum and pylorus all appeared normal.  The duodenum was entered and both  the bulb and second portion were well inspected and appeared to be within  normal limits.  The scope was withdrawn back to the GE junction.  There was  active bleeding just above the GE junction that appeared to be from a  Mallory-Weiss tear, although there was a small bulge there; and I could not  rule out an isolated varix but felt this was more likely from a tear.  I  injected epinephrine into the area, a total of 3 mL, but the bleeding did  not stop.  I then directed an Endoclip toward the area where there appeared  to be active bleeding, but it misfired and did not stop the bleeding.  A  second Endoclip was closer to the area  of active bleeding, but the bleeding  still persisted.  A third Endoclip was deployed, and it seemed to deploy  just below the bleeding site, and it continued to bleed.  At this point, I  considered that this might possibly represent an isolated varix, although I  saw no other varices; but due to the clips, it did not appear possible to  band this area, and I simply decided to terminate the procedure and to re-  inspect the area in approximately 6-12 hours to see if it was still  bleeding.  The scope was then withdrawn, and the patient returned to the  recovery room in stable condition.  She tolerated the procedure well, and  there were no immediate complications.  IMPRESSION:  Active bleeding at the gastroesophageal junction presumably  from Mallory-Weiss tear, can not rule out a vascular lesion.   PLAN:  Supportive care and will repeat EGD in approximately 6-12 hours.                                               John C. Madilyn Fireman, M.D.    JCH/MEDQ  D:  08/21/2002  T:  08/21/2002  Job:  [id number]   cc:   Angelena Sole, M.D. [facility]

## 2010-11-25 NOTE — Discharge Summary (Signed)
[name], [name]                          ACCOUNT NO.:  [id number]   MEDICAL RECORD NO.:  [id number]                   PATIENT TYPE:  IPS   LOCATION:  0508                                 FACILITY[facility]  [facility]   PHYSICIAN:  Geoffery Lyons, M.D.                   DATE OF BIRTH:  30-Dec-1955   DATE OF ADMISSION:  10/30/2002  DATE OF DISCHARGE:  11/03/2002                                 DISCHARGE SUMMARY   CHIEF COMPLAINT AND PRESENT ILLNESS:  This was the second admission to [facility] for this 55 year old divorced white female,  voluntarily admitted.  History of alcohol abuse.  Relapsed after two weeks  of being detoxed.  Came for help.  Had been having problem with vomiting and  a decrease appetite with a 10-pound weight loss.  Has had a difficult time  with her ex-husband and no job.  Is going to school for medical coding.  She  is motivated to stay sober.  Wanted to go to [facility] program.   PAST PSYCHIATRIC HISTORY:  Was here two months prior to this admission for  alcohol detox.   ALCOHOL/DRUG HISTORY:  Nonsmoker.  Last drink Saturday night before this  admission.  Drinking since the age of 19.  No drug use.   PAST MEDICAL HISTORY:  Hypertension.   MEDICATIONS:  Was on atenolol.  Has not been compliant.  Took  benzodiazepines to help her sleep terror.   PHYSICAL EXAMINATION:  Performed and failed to show any acute findings.   MENTAL STATUS EXAM:  The patient is an alert, nicely-dressed, middle-aged  female.  Cooperative.  Fair eye contact.  Speech is good.  Goal-oriented.  Mood is anxious and feeling nauseated.  Aggressive and anxious.  Thought  processes are coherent.  No evidence of psychosis.  Cognition well-  preserved.   ADMISSION DIAGNOSES:   AXIS I:  Alcohol abuse; rule out dependence.   AXIS II:  Deferred.   AXIS III:  Hypertension.   AXIS IV:  Moderate.   AXIS V:  Global Assessment of Functioning upon admission 35; highest Global  Assessment of  Functioning in the last year 65.   LABORATORY DATA:  Thyroid profile within normal limits.  Other findings  within normal limits.   HOSPITAL COURSE:  She was admitted and started intensive individual and  group psychotherapy.  She was detoxified using Librium.  She was given some  trazodone for sleep, some Protonix for her stomach.  Initially more  Phenergan was needed; later she needed less and less.  Continued to  experience nausea, vomiting, tremors, not sleeping well.  Worked on a  relapse prevention plan.  The detoxification went uneventfully.  There was  some tremulousness.  Blood pressure decreased.  She continued to evidence  anxiety.  Slowly, she started sleeping better but continued to be nauseated.  Decrease in the tremors.  Continued  the detox and, on November 03, 2002, she  was better, had worked on herself, on coping skills, her relapse prevention  plan.  No suicidal ideation.  No homicidal ideation.  Was going to [facility] for  further treatment.   DISCHARGE DIAGNOSES:   AXIS I:  1. Major depression.  2. Alcohol dependence.   AXIS II:  No diagnosis.   AXIS III:  Hypertension.   AXIS IV:  Moderate.   AXIS V:  Global Assessment of Functioning upon discharge 60.   DISCHARGE MEDICATIONS:  1. Protonix 40 mg per day.  2. Trazodone 50 mg at bedtime for sleep.   FOLLOW UP:  [facility] outpatient clinic.                                               Geoffery Lyons, M.D.    IL/MEDQ  D:  11/26/2002  T:  11/26/2002  Job:  [id number]

## 2010-11-25 NOTE — Consult Note (Signed)
NAMEORENA, CAVAZOS                ACCOUNT NO.:  [id number]   MEDICAL RECORD NO.:  [id number]          PATIENT TYPE:  EMS   LOCATION:  MAJO                         FACILITY:  [facility]   PHYSICIAN:  Kristine Garbe. Ezzard Standing, M.D.DATE OF BIRTH:  1955-07-11   DATE OF CONSULTATION:  05/11/2005  DATE OF DISCHARGE:  05/11/2005                                   CONSULTATION   REASON FOR EMERGENCY ROOM CONSULTATION:  Right-sided epistaxis.   BRIEF HISTORY:  [name] is a 55 year old female who has had a history  of nosebleeds, especially from the right side.  She was taken to the  operating room and cauterized about a week and a half ago and earlier today  developed some bleeding from the right side of her nose that has been  persistent, and she presents to the emergency room because of persistent  right-sided epistaxis.   On exam in the ER, she had a large amount of blood clot in both nasal  passages.  This was cleaned from the nose first.  The area of bleeding  seemed to be just at the posterior portion of the anterior septal  cauterization anteriorly or just posteriorly superior to the area of  cauterization.  This was recauterized with silver nitrate in the emergency  room and the nose was packed with a Merocel pack.   IMPRESSION:  Right-sided epistaxis from midposterior right septal vessel.   RECOMMENDATIONS:  This was cauterized and packed in the emergency room.  We  will have her follow up in the office in four days to have the nasal packing  removed.           ______________________________  Kristine Garbe Ezzard Standing, M.D.     CEN/MEDQ  D:  05/12/2005  T:  05/12/2005  Job:  [id number]
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
## 2010-11-25 NOTE — Op Note (Signed)
NAME:  [name], [name]                          ACCOUNT NO.:  [id number]   MEDICAL RECORD NO.:  [id number]                   PATIENT TYPE:  INP   LOCATION:  3313                                 FACILITY:  [facility]   PHYSICIAN:  John C. Madilyn Fireman, M.D.                 DATE OF BIRTH:  1955/07/24   DATE OF PROCEDURE:  08/21/2002  DATE OF DISCHARGE:                                 OPERATIVE REPORT   PROCEDURE:  Esophagoscopy.   INDICATIONS FOR PROCEDURE:  The patient had upper GI bleeding and underwent  an EGD about 8 hours ago, had active bleeding at the GE junction, which did  not cease after epinephrine injection and three Endoclip placements.  This  lesion was felt to be a Mallory-Weiss tear, but a small vascular lesion  could not be ruled out.  It was decided to go back for a second look to see  if the bleeding had stopped.  She has been clinically stable since the first  endoscopy.   PROCEDURE IN DETAIL:  The patient was placed in the left lateral decubitus  position and placed on the pulse monitor with continuous low-flow oxygen,  delivered by nasal cannula.  She was sedated with 100 mcg IV fentanyl and 8  mg IV Versed.  The Olympus video endoscope was advanced under direct vision  into the lower pharynx and esophagus.  Due the previous bleeding and the  clips placed, I advanced very cautiously to the GE junction.  I saw no blood  in the proximal or middle esophagus.  One clip was seen proximally, and I  carefully advanced the scope further, and at the GE junction, there was some  clotted blood that was adherent to the mucosa, but there was no active  oozing. There was still some old blood in the stomach, which I visualized  briefly and then withdrew the scope.  I saw a more distal clip at the level  of the clot, and with water lavage, there appeared to be no active bleeding  around it.  The third clip that was placed was not seen, and it may have  fallen off.  After satisfying myself  that there was no active bleeding at  the GE junction, I decided to terminate the procedure.  The scope was then  withdrawn, and the patient returned to the recovery room in stable  condition.  She tolerated the procedure well, and there were no immediate  complications.   IMPRESSION:  No active bleeding from the gastroesophageal junction,  definitive previous presumed Mallory-Weiss tear.   PLAN:  Continue supportive care and anti-peptic medication, and we will  discontinue her octreotide.  John C. Madilyn Fireman, M.D.    JCH/MEDQ  D:  08/21/2002  T:  08/21/2002  Job:  [id number]

## 2010-11-25 NOTE — Discharge Summary (Signed)
NAME:  [name], [name]                          ACCOUNT NO.:  [id number]   MEDICAL RECORD NO.:  [id number]                   PATIENT TYPE:  IPS   LOCATION:  0503                                 FACILITY:  [facility]   PHYSICIAN:  Geoffery Lyons, M.D.                   DATE OF BIRTH:  03/25/1956   DATE OF ADMISSION:  09/19/2002  DATE OF DISCHARGE:  09/23/2002                                 DISCHARGE SUMMARY   CHIEF COMPLAINT AND PRESENT ILLNESS:  This was the first admission to [facility] for this 55 year old white divorced female  voluntarily admitted.  Presented to the emergency room requesting help.  Severe vomiting with blood alcohol of 50.  Referred by emergency room  physician.  Last drank on September 17, 2002.  Claimed no more than two glasses  of wine for last 2-3 weeks but she has been drinking since age 54.  Increasing since she lost her job in 2002.  Much worse in the past six  months secondary to conflict with the husband.  Endorsed shakes when she  tried to quit.   PAST PSYCHIATRIC HISTORY:  Denies any prior mood problems.   ALCOHOL/DRUG HISTORY:  Ongoing use of alcohol.  Denies any other substances.   PAST MEDICAL HISTORY:  Hypertension, Mallory-Weiss tears.   MEDICATIONS:  Atenolol 50 mg in the morning, hydrochlorothiazide 25 mg.   PHYSICAL EXAMINATION:  Performed and failed to show any acute findings.   MENTAL STATUS EXAM:  Small, petite, tremulous female with cool, moist palms,  in full detox.  Alert and anxious.  Tremulous voice.  Mood depressed.  Strong denial of the extent of her alcohol use.  Thought processes with  minimization, denial.  No suicidal ideation.  No homicidal ideation.  No  psychosis.  Cognition well-preserved.   ADMISSION DIAGNOSES:   AXIS I:  Alcohol dependence.   AXIS II:  No diagnosis.   AXIS III:  1. Hypertension.  2. History of Mallory-Weiss tears.   AXIS IV:  Moderate.   AXIS V:  Global Assessment of Functioning upon  admission 25; highest Global  Assessment of Functioning in the last year 62.   HOSPITAL COURSE:  She was admitted and started intensive individual and  group psychotherapy.  She was maintained on her atenolol,  hydrochlorothiazide.  She was given Librium for detox.  Continued to  minimize the amount of alcohol she was drinking.  Claimed that she got  depressed due to the loss of her job.  Labs did show increased SGOT,  increased SGPT and increased MCV.  Gradually, she started admitting that she  was drinking more than she cared to admit.  Mood was depressed.  Affect was  depressed and anxious.  Thoughts were feeling very overwhelmed.  Minimizing  initially the amount of alcohol but later more accurate in terms of the way  she was drinking.  A family session with the parents turned positive.  They  were supportive.  She continued to be detoxed.  Continued to experience  difficulty with sleep.  She was willing to come to [facility] once she was  discharged.  On September 23, 2002, she was in full contact with reality.  Fully  detoxed.  No suicidal ideation.  No homicidal ideation.  She was willing to  come to the [facility] and continue to work on long-term abstinence.  We had  worked on a relapse prevention plan.  She had increased understanding and  insight in terms of alcohol dependency and the need to abstain.   DISCHARGE DIAGNOSES:   AXIS I:  Alcohol dependence.   AXIS II:  No diagnosis.   AXIS III:  1. Hypertension.  2. History of Mallory-Weiss tears.   AXIS IV:  Moderate.   AXIS V:  Global Assessment of Functioning upon discharge 55.   DISCHARGE MEDICATIONS:  1. Protonix 40 mg daily.  2. Trazodone 50 mg at bedtime for sleep.   [facility]                                               Geoffery Lyons, M.D.    IL/MEDQ  D:  10/20/2002  T:  10/21/2002  Job:  [id number]

## 2010-11-25 NOTE — Discharge Summary (Signed)
[name], [name]                          ACCOUNT NO.:  [id number]   MEDICAL RECORD NO.:  [id number]                   PATIENT TYPE:  INP   LOCATION:  0483                                 FACILITY:  [facility]   PHYSICIAN:  Rene Paci, M.D. [facility]          DATE OF BIRTH:  1956-06-27   DATE OF ADMISSION:  11/14/2002  DATE OF DISCHARGE:  11/17/2002                                 DISCHARGE SUMMARY   DISCHARGE DIAGNOSES:  1. Alcohol withdrawal and delirium tremens, resolved.  2. Nausea and vomiting secondary to above.  3. Hypokalemia secondary to above, resolved.   DISCHARGE MEDICATIONS:  1. Ativan 1 mg tablets 1/2 to full tablet q.12 h. p.r.n. anxiety, nerves, or     agitation symptoms; one tablet p.o. q.h.s. p.r.n. insomnia symptoms; max     3 tablets p.o. daily.  2. The patient is also encouraged to take folic acid 1 mg daily, plus     multivitamin, plus thiamin 100 mg p.o. daily.   DISPOSITION:  The patient is being discharged from [facility]  today to follow up with [facility] outpatient therapy for continued alcohol rehab.  This has been arranged by her family and is not a coordinated facility to  facility transfer.  The patient was evaluated by psychiatry while inpatient  during her withdrawal who recommended this outpatient therapy followup.  The  patient will also call her primary care physician, Loreen Freud, M.D. for  follow up in one to two weeks completing rehab therapy or as needed for  further problems as they arise.   CONDITION ON DISCHARGE:  Medically stable and improved.   BRIEF HOSPITAL COURSE:  This is a pleasant 55 year old woman with several  admissions in the past for alcohol withdrawal symptoms and dehydration who  presented to office day of admission secondary to recurrent DT's.  The  patient's father prompted the patient's office evaluation and after a  discussion with the outpatient physician it was determined that the patient  would be better  served as an inpatient on a Librium protocol.  She was  accordingly admitted to [facility] and begun on a Librium  protocol.  Psychiatry consult was obtained who recommended continuation of  benzodiazepines over the 72 hours and then discharge to [facility].  Arrangements  directly for discharge to [facility] could not be arranged secondary to insurance  issues.  The patient's father has arranged for the patient to be admitted to  [facility] as an outpatient for the same rehab therapy treatment.  This patient is  now on p.r.n. medications only and it is felt that she may continue her  Ativan p.r.n. as stated above for symptoms of nervousness and shakiness to  continue her rehab as an outpatient.  The patient is also welcome to return  to the emergency room if there are issues with establishment of [facility]  rehabilitation as planned.  The patient is tolerating p.o.  oral.  Her nausea  and vomiting has resolved and all electrolytes are normal.                                               Rene Paci, M.D. [facility]    VL/MEDQ  D:  11/17/2002  T:  11/17/2002  Job:  [id number]

## 2010-11-25 NOTE — Discharge Summary (Signed)
[place]. [facility]  Patient:    [name], [name]                       MRN: [id number] Adm. Date:  [id number] Disc. Date: [id number] Attending:  Angelena Sole CC:         Angelena Sole, M.D. [facility]   Discharge Summary  24-HOUR EVALUATION  ADMISSION DIAGNOSIS:  Dehydration.  HISTORY OF PRESENT ILLNESS:  The patient is a 55 year old white female being treated for one week with amoxicillin for bronchitis.  She has been vomiting three to four times daily for the past five days and has had four to five loose watery stools daily.  The patient has not been able to take any p.o. solids and very few fluids.  In the office, she was noted by Angelena Sole, M.D., to have nausea.  She was shaky and not feeling well.  She felt that her face was swollen.  She had no shortness of breath.  It was felt that her bronchitis was improving.  In the office, the patient was Phenergan.  She was given Benadryl 50 mg IM for facial swelling and given 1 L of normal saline, but she continued to be shaky and ill.  For this reason, she was admitted for IV hydration.  PAST SURGICAL HISTORY:  Complex knee and foot surgery.  PAST MEDICAL HISTORY:  Notable for hypertension.  DRUG ALLERGIES:  CODEINE.  MEDICATIONS AT ADMISSION: 1. Atenolol 50 mg daily. 2. OCPs daily. 3. Multivitamins daily.  ADMITTING PHYSICAL EXAMINATION:  Per Angelena Sole, M.D., the blood pressure was 180/100, respirations were 18, and the heart rate was 80.  HEENT:  Exam was unremarkable.  Her mucous membranes were dry.  She had no exudates.  She had no sinus tenderness.  NECK:  Supple.  There were no nodes.  HEART:  The heart rate was regular with an S1 and S2.  The patients supine bp was 180/100 with a heart rate of 80.  Standing, it was 180/100 with a heart rate of 84.  LUNGS:  Clear to auscultation and percussion.  HOSPITAL COURSE:  The patient was admitted to the hospital and started on IV fluids.  On  August 13, 2000, her sodium was 121, her potassium was 3.3, and her chloride was 83.  The glucose was 91, BUN 6, and creatinine 0.9.  The SGOT was 113 and the SGPT was 84.  CBC with a white count of 6900, hemoglobin 13.8, hematocrit 39.4, and MCV 100.3.  She had 69% segs, 15% lymphs, and 9% monos.  The patient was doing well after 24 hours of IV hydration.  Her initial labs were consistent with significant fluid depletion with low potassium and sodium.  Repeat labs were ordered, but are pending at the time of the discharge dictation.  I suspect they will be significantly improved with the patient having received significant amounts of IV normal saline.  DISCHARGE PHYSICAL EXAMINATION:  The temperature was not recorded, blood pressure 132/78, heart rate 77, respirations 16, and O2 saturation was 97% on room air.  HEENT:  Exam was unremarkable.  CHEST:  Clear with good breath sounds.  ABDOMEN:  Soft with no tenderness.  DISPOSITION:  The patient is discharged home.  DISCHARGE MEDICATIONS:  She is to take potassium 20 mEq daily.  FOLLOW-UP:  The patient is instructed to see Angelena Sole, M.D., in one week for follow-up.  Would recommend that she have  a BMET and liver function studies at that time to make sure laboratory has returned to normal.  The patient does have Phenergan at home which she may take on a p.r.n. basis.  She does take lorazepam at home, which she may continue.  CONDITION ON DISCHARGE:  The patients condition at the time of discharge dictation is stable and improved. DD:  08/14/00 TD:  08/15/00 Job: [id number]

## 2011-04-17 ENCOUNTER — Other Ambulatory Visit: Payer: Self-pay | Admitting: Family Medicine

## 2011-04-17 DIAGNOSIS — Z1231 Encounter for screening mammogram for malignant neoplasm of breast: Secondary | ICD-10-CM

## 2011-04-19 ENCOUNTER — Ambulatory Visit: Payer: Self-pay

## 2011-08-02 ENCOUNTER — Ambulatory Visit: Payer: Self-pay

## 2011-08-09 ENCOUNTER — Encounter: Payer: Self-pay | Admitting: Family Medicine

## 2011-08-09 ENCOUNTER — Ambulatory Visit
Admission: RE | Admit: 2011-08-09 | Discharge: 2011-08-09 | Disposition: A | Discharge status: Home / Self Care | Payer: 59 | Source: Ambulatory Visit | Attending: Family Medicine | Admitting: Family Medicine

## 2011-08-09 DIAGNOSIS — Z1231 Encounter for screening mammogram for malignant neoplasm of breast: Secondary | ICD-10-CM

## 2011-09-04 ENCOUNTER — Encounter: Payer: Self-pay | Admitting: Family Medicine

## 2011-09-05 ENCOUNTER — Encounter: Payer: Self-pay | Admitting: Family Medicine

## 2011-09-13 ENCOUNTER — Other Ambulatory Visit (HOSPITAL_COMMUNITY)
Admission: RE | Admit: 2011-09-13 | Discharge: 2011-09-13 | Disposition: A | Discharge status: Home / Self Care | Payer: 59 | Source: Ambulatory Visit | Attending: Family Medicine | Admitting: Family Medicine

## 2011-09-13 ENCOUNTER — Encounter: Payer: Self-pay | Admitting: Family Medicine

## 2011-09-13 ENCOUNTER — Ambulatory Visit (INDEPENDENT_AMBULATORY_CARE_PROVIDER_SITE_OTHER): Payer: 59 | Admitting: Family Medicine

## 2011-09-13 VITALS — BP 130/80 | HR 81 | Temp 98.7°F | Ht 60.5 in | Wt 111.0 lb

## 2011-09-13 DIAGNOSIS — F1011 Alcohol abuse, in remission: Secondary | ICD-10-CM

## 2011-09-13 DIAGNOSIS — Z0000 Encounter for general adult medical examination without abnormal findings: Secondary | ICD-10-CM

## 2011-09-13 DIAGNOSIS — R319 Hematuria, unspecified: Secondary | ICD-10-CM

## 2011-09-13 DIAGNOSIS — Z124 Encounter for screening for malignant neoplasm of cervix: Secondary | ICD-10-CM

## 2011-09-13 DIAGNOSIS — I10 Essential (primary) hypertension: Secondary | ICD-10-CM

## 2011-09-13 DIAGNOSIS — Z01419 Encounter for gynecological examination (general) (routine) without abnormal findings: Secondary | ICD-10-CM | POA: Insufficient documentation

## 2011-09-13 LAB — HEPATIC FUNCTION PANEL
AST: 23 U/L (ref 0–37)
Alkaline Phosphatase: 61 U/L (ref 39–117)
Bilirubin, Direct: 0.1 mg/dL (ref 0.0–0.3)

## 2011-09-13 LAB — BASIC METABOLIC PANEL
Calcium: 9.7 mg/dL (ref 8.4–10.5)
Chloride: 95 mEq/L — ABNORMAL LOW (ref 96–112)
Creatinine, Ser: 0.8 mg/dL (ref 0.4–1.2)
GFR: 82.82 mL/min (ref >60.00)
Glucose, Bld: 75 mg/dL (ref 70–99)

## 2011-09-13 LAB — CBC WITH DIFFERENTIAL/PLATELET
Basophils Absolute: 0.1 10*3/uL (ref 0.0–0.1)
Basophils Relative: 1 % (ref 0.0–3.0)
Eosinophils Relative: 2.9 % (ref 0.0–5.0)
Hemoglobin: 13.6 g/dL (ref 12.0–15.0)
Lymphs Abs: 1.1 10*3/uL (ref 0.7–4.0)
Monocytes Relative: 11.7 % (ref 3.0–12.0)
Neutro Abs: 3.6 10*3/uL (ref 1.4–7.7)
RBC: 3.98 Mil/uL (ref 3.87–5.11)
RDW: 12 % (ref 11.5–14.6)
WBC: 5.6 10*3/uL (ref 4.5–10.5)

## 2011-09-13 LAB — POCT URINALYSIS DIPSTICK
Glucose, UA: NEGATIVE
Ketones, UA: NEGATIVE
Nitrite, UA: NEGATIVE
Urobilinogen, UA: 0.2
pH, UA: 7

## 2011-09-13 LAB — LIPID PANEL
Cholesterol: 199 mg/dL (ref 0–200)
HDL: 70 mg/dL (ref >39.00)
Total CHOL/HDL Ratio: 3
VLDL: 11 mg/dL (ref 0.0–40.0)

## 2011-09-13 LAB — TSH: TSH: 0.91 u[IU]/mL (ref 0.35–5.50)

## 2011-09-13 MED ORDER — LISINOPRIL 10 MG PO TABS: ORAL_TABLET | ORAL | Status: DC

## 2011-09-13 NOTE — Progress Notes (Signed)
Subjective:     [name] is a 56 y.o. female and is here for a comprehensive physical exam. The patient reports no problems.  History   Social History  . Marital Status: Divorced    Spouse Name: N/A    Number of Children: N/A  . Years of Education: N/A   Occupational History  . [employer]    Social History Main Topics  . Smoking status: Never Smoker   . Smokeless tobacco: Never Used  . Alcohol Use: No     no drink in 3 weeks  . Drug Use: No  . Sexually Active: Not Currently -- Female partner(s)   Other Topics Concern  . Not on file   Social History Narrative   Exercise--- gyn 5x a week   Health Maintenance  Topic Date Due  . Influenza Vaccine  04/14/2012  . Mammogram  08/08/2013  . Tetanus/tdap  10/20/2013  . Pap Smear  09/13/2014  . Colonoscopy  11/03/2017    The following portions of the patient's history were reviewed and updated as appropriate: allergies, current medications, past family history, past medical history, past social history, past surgical history and problem list.  Review of Systems Review of Systems  Constitutional: Negative for activity change, appetite change and fatigue.  HENT: Negative for hearing loss, congestion, tinnitus and ear discharge.  dentist q28m Eyes: Negative for visual disturbance (see optho q1y -- vision corrected to 20/20 with glasses).  Respiratory: Negative for cough, chest tightness and shortness of breath.   Cardiovascular: Negative for chest pain, palpitations and leg swelling.  Gastrointestinal: Negative for abdominal pain, diarrhea, constipation and abdominal distention.  Genitourinary: Negative for urgency, frequency, decreased urine volume and difficulty urinating.  Musculoskeletal: Negative for back pain, arthralgias and gait problem.  Skin: Negative for color change, pallor and rash.  Neurological: Negative for dizziness, light-headedness, numbness and headaches.  Hematological: Negative for adenopathy. Does not  bruise/bleed easily.  Psychiatric/Behavioral: Negative for suicidal ideas, confusion, sleep disturbance, self-injury, dysphoric mood, decreased concentration and agitation.       Objective:    BP 130/80  Pulse 81  Temp(Src) 98.7 F (37.1 C) (Oral)  Ht 5' 0.5" (1.537 m)  Wt 111 lb (50.349 kg)  BMI 21.32 kg/m2  SpO2 99% General appearance: alert, cooperative, appears stated age and no distress Head: Normocephalic, without obvious abnormality, atraumatic Eyes: conjunctivae/corneas clear. PERRL, EOM's intact. Fundi benign. Ears: normal TM's and external ear canals both ears Nose: Nares normal. Septum midline. Mucosa normal. No drainage or sinus tenderness. Throat: lips, mucosa, and tongue normal; teeth and gums normal Neck: no adenopathy, supple, symmetrical, trachea midline and thyroid not enlarged, symmetric, no tenderness/mass/nodules Back: symmetric, no curvature. ROM normal. No CVA tenderness. Lungs: clear to auscultation bilaterally Breasts: normal appearance, no masses or tenderness, + implants Heart: regular rate and rhythm, S1, S2 normal, no murmur, click, rub or gallop Abdomen: soft, non-tender; bowel sounds normal; no masses,  no organomegaly Pelvic: cervix normal in appearance, external genitalia normal, no adnexal masses or tenderness, no cervical motion tenderness, rectovaginal septum normal, uterus normal size, shape, and consistency and vagina normal without discharge--pap done, heme neg brown stool Extremities: extremities normal, atraumatic, no cyanosis or edema Pulses: 2+ and symmetric Skin: Skin color, texture, turgor normal. No rashes or lesions Lymph nodes: Cervical, supraclavicular, and axillary nodes normal. Neurologic: Alert and oriented X 3, normal strength and tone. Normal symmetric reflexes. Normal coordination and gait psych--+ flat affect    Assessment:    Healthy  female exam.     Htn-- con't meds Hx etoh abuse---- con't AA,  Pt quit again 3 weeks  ago  Plan:  ghm utd Check fasting labs    See After Visit Summary for Counseling Recommendations

## 2011-09-13 NOTE — Progress Notes (Signed)
Addended by: Jerrika Ledlow D on: 09/13/2011 01:32 PM   Modules accepted: Orders  

## 2011-09-13 NOTE — Patient Instructions (Signed)
Preventive Care for Adults, Female A healthy lifestyle and preventive care can promote health and wellness. Preventive health guidelines for women include the following key practices.  A routine yearly physical is a good way to check with your caregiver about your health and preventive screening. It is a chance to share any concerns and updates on your health, and to receive a thorough exam.   Visit your dentist for a routine exam and preventive care every 6 months. Brush your teeth twice a day and floss once a day. Good oral hygiene prevents tooth decay and gum disease.   The frequency of eye exams is based on your age, health, family medical history, use of contact lenses, and other factors. Follow your caregiver's recommendations for frequency of eye exams.   Eat a healthy diet. Foods like vegetables, fruits, whole grains, low-fat dairy products, and lean protein foods contain the nutrients you need without too many calories. Decrease your intake of foods high in solid fats, added sugars, and salt. Eat the right amount of calories for you.Get information about a proper diet from your caregiver, if necessary.   Regular physical exercise is one of the most important things you can do for your health. Most adults should get at least 150 minutes of moderate-intensity exercise (any activity that increases your heart rate and causes you to sweat) each week. In addition, most adults need muscle-strengthening exercises on 2 or more days a week.   Maintain a healthy weight. The body mass index (BMI) is a screening tool to identify possible weight problems. It provides an estimate of body fat based on height and weight. Your caregiver can help determine your BMI, and can help you achieve or maintain a healthy weight.For adults 20 years and older:   A BMI below 18.5 is considered underweight.   A BMI of 18.5 to 24.9 is normal.   A BMI of 25 to 29.9 is considered overweight.   A BMI of 30 and above is  considered obese.   Maintain normal blood lipids and cholesterol levels by exercising and minimizing your intake of saturated fat. Eat a balanced diet with plenty of fruit and vegetables. Blood tests for lipids and cholesterol should begin at age 20 and be repeated every 5 years. If your lipid or cholesterol levels are high, you are over 50, or you are at high risk for heart disease, you may need your cholesterol levels checked more frequently.Ongoing high lipid and cholesterol levels should be treated with medicines if diet and exercise are not effective.   If you smoke, find out from your caregiver how to quit. If you do not use tobacco, do not start.   If you are pregnant, do not drink alcohol. If you are breastfeeding, be very cautious about drinking alcohol. If you are not pregnant and choose to drink alcohol, do not exceed 1 drink per day. One drink is considered to be 12 ounces (355 mL) of beer, 5 ounces (148 mL) of wine, or 1.5 ounces (44 mL) of liquor.   Avoid use of street drugs. Do not share needles with anyone. Ask for help if you need support or instructions about stopping the use of drugs.   High blood pressure causes heart disease and increases the risk of stroke. Your blood pressure should be checked at least every 1 to 2 years. Ongoing high blood pressure should be treated with medicines if weight loss and exercise are not effective.   If you are 55 to 55   years old, ask your caregiver if you should take aspirin to prevent strokes.   Diabetes screening involves taking a blood sample to check your fasting blood sugar level. This should be done once every 3 years, after age 45, if you are within normal weight and without risk factors for diabetes. Testing should be considered at a younger age or be carried out more frequently if you are overweight and have at least 1 risk factor for diabetes.   Breast cancer screening is essential preventive care for women. You should practice "breast  self-awareness." This means understanding the normal appearance and feel of your breasts and may include breast self-examination. Any changes detected, no matter how small, should be reported to a caregiver. Women in their 20s and 30s should have a clinical breast exam (CBE) by a caregiver as part of a regular health exam every 1 to 3 years. After age 40, women should have a CBE every year. Starting at age 40, women should consider having a mammography (breast X-ray test) every year. Women who have a family history of breast cancer should talk to their caregiver about genetic screening. Women at a high risk of breast cancer should talk to their caregivers about having magnetic resonance imaging (MRI) and a mammography every year.   The Pap test is a screening test for cervical cancer. A Pap test can show cell changes on the cervix that might become cervical cancer if left untreated. A Pap test is a procedure in which cells are obtained and examined from the lower end of the uterus (cervix).   Women should have a Pap test starting at age 21.   Between ages 21 and 29, Pap tests should be repeated every 2 years.   Beginning at age 30, you should have a Pap test every 3 years as long as the past 3 Pap tests have been normal.   Some women have medical problems that increase the chance of getting cervical cancer. Talk to your caregiver about these problems. It is especially important to talk to your caregiver if a new problem develops soon after your last Pap test. In these cases, your caregiver may recommend more frequent screening and Pap tests.   The above recommendations are the same for women who have or have not gotten the vaccine for human papillomavirus (HPV).   If you had a hysterectomy for a problem that was not cancer or a condition that could lead to cancer, then you no longer need Pap tests. Even if you no longer need a Pap test, a regular exam is a good idea to make sure no other problems are  starting.   If you are between ages 65 and 70, and you have had normal Pap tests going back 10 years, you no longer need Pap tests. Even if you no longer need a Pap test, a regular exam is a good idea to make sure no other problems are starting.   If you have had past treatment for cervical cancer or a condition that could lead to cancer, you need Pap tests and screening for cancer for at least 20 years after your treatment.   If Pap tests have been discontinued, risk factors (such as a new sexual partner) need to be reassessed to determine if screening should be resumed.   The HPV test is an additional test that may be used for cervical cancer screening. The HPV test looks for the virus that can cause the cell changes on the cervix.   The cells collected during the Pap test can be tested for HPV. The HPV test could be used to screen women aged 30 years and older, and should be used in women of any age who have unclear Pap test results. After the age of 30, women should have HPV testing at the same frequency as a Pap test.   Colorectal cancer can be detected and often prevented. Most routine colorectal cancer screening begins at the age of 50 and continues through age 75. However, your caregiver may recommend screening at an earlier age if you have risk factors for colon cancer. On a yearly basis, your caregiver may provide home test kits to check for hidden blood in the stool. Use of a small camera at the end of a tube, to directly examine the colon (sigmoidoscopy or colonoscopy), can detect the earliest forms of colorectal cancer. Talk to your caregiver about this at age 50, when routine screening begins. Direct examination of the colon should be repeated every 5 to 10 years through age 75, unless early forms of pre-cancerous polyps or small growths are found.   Hepatitis C blood testing is recommended for all people born from 1945 through 1965 and any individual with known risks for hepatitis C.    Practice safe sex. Use condoms and avoid high-risk sexual practices to reduce the spread of sexually transmitted infections (STIs). STIs include gonorrhea, chlamydia, syphilis, trichomonas, herpes, HPV, and human immunodeficiency virus (HIV). Herpes, HIV, and HPV are viral illnesses that have no cure. They can result in disability, cancer, and death. Sexually active women aged 25 and younger should be checked for chlamydia. Older women with new or multiple partners should also be tested for chlamydia. Testing for other STIs is recommended if you are sexually active and at increased risk.   Osteoporosis is a disease in which the bones lose minerals and strength with aging. This can result in serious bone fractures. The risk of osteoporosis can be identified using a bone density scan. Women ages 65 and over and women at risk for fractures or osteoporosis should discuss screening with their caregivers. Ask your caregiver whether you should take a calcium supplement or vitamin D to reduce the rate of osteoporosis.   Menopause can be associated with physical symptoms and risks. Hormone replacement therapy is available to decrease symptoms and risks. You should talk to your caregiver about whether hormone replacement therapy is right for you.   Use sunscreen with sun protection factor (SPF) of 30 or more. Apply sunscreen liberally and repeatedly throughout the day. You should seek shade when your shadow is shorter than you. Protect yourself by wearing long sleeves, pants, a wide-brimmed hat, and sunglasses year round, whenever you are outdoors.   Once a month, do a whole body skin exam, using a mirror to look at the skin on your back. Notify your caregiver of new moles, moles that have irregular borders, moles that are larger than a pencil eraser, or moles that have changed in shape or color.   Stay current with required immunizations.   Influenza. You need a dose every fall (or winter). The composition of  the flu vaccine changes each year, so being vaccinated once is not enough.   Pneumococcal polysaccharide. You need 1 to 2 doses if you smoke cigarettes or if you have certain chronic medical conditions. You need 1 dose at age 65 (or older) if you have never been vaccinated.   Tetanus, diphtheria, pertussis (Tdap, Td). Get 1 dose of   Tdap vaccine if you are younger than age 65, are over 65 and have contact with an infant, are a healthcare worker, are pregnant, or simply want to be protected from whooping cough. After that, you need a Td booster dose every 10 years. Consult your caregiver if you have not had at least 3 tetanus and diphtheria-containing shots sometime in your life or have a deep or dirty wound.   HPV. You need this vaccine if you are a woman age 26 or younger. The vaccine is given in 3 doses over 6 months.   Measles, mumps, rubella (MMR). You need at least 1 dose of MMR if you were born in 1957 or later. You may also need a second dose.   Meningococcal. If you are age 19 to 21 and a first-year college student living in a residence hall, or have one of several medical conditions, you need to get vaccinated against meningococcal disease. You may also need additional booster doses.   Zoster (shingles). If you are age 60 or older, you should get this vaccine.   Varicella (chickenpox). If you have never had chickenpox or you were vaccinated but received only 1 dose, talk to your caregiver to find out if you need this vaccine.   Hepatitis A. You need this vaccine if you have a specific risk factor for hepatitis A virus infection or you simply wish to be protected from this disease. The vaccine is usually given as 2 doses, 6 to 18 months apart.   Hepatitis B. You need this vaccine if you have a specific risk factor for hepatitis B virus infection or you simply wish to be protected from this disease. The vaccine is given in 3 doses, usually over 6 months.  Preventive Services /  Frequency Ages 19 to 39  Blood pressure check.** / Every 1 to 2 years.   Lipid and cholesterol check.** / Every 5 years beginning at age 20.   Clinical breast exam.** / Every 3 years for women in their 20s and 30s.   Pap test.** / Every 2 years from ages 21 through 29. Every 3 years starting at age 30 through age 65 or 70 with a history of 3 consecutive normal Pap tests.   HPV screening.** / Every 3 years from ages 30 through ages 65 to 70 with a history of 3 consecutive normal Pap tests.   Hepatitis C blood test.** / For any individual with known risks for hepatitis C.   Skin self-exam. / Monthly.   Influenza immunization.** / Every year.   Pneumococcal polysaccharide immunization.** / 1 to 2 doses if you smoke cigarettes or if you have certain chronic medical conditions.   Tetanus, diphtheria, pertussis (Tdap, Td) immunization. / A one-time dose of Tdap vaccine. After that, you need a Td booster dose every 10 years.   HPV immunization. / 3 doses over 6 months, if you are 26 and younger.   Measles, mumps, rubella (MMR) immunization. / You need at least 1 dose of MMR if you were born in 1957 or later. You may also need a second dose.   Meningococcal immunization. / 1 dose if you are age 19 to 21 and a first-year college student living in a residence hall, or have one of several medical conditions, you need to get vaccinated against meningococcal disease. You may also need additional booster doses.   Varicella immunization.** / Consult your caregiver.   Hepatitis A immunization.** / Consult your caregiver. 2 doses, 6 to 18 months   apart.   Hepatitis B immunization.** / Consult your caregiver. 3 doses usually over 6 months.  Ages 40 to 64  Blood pressure check.** / Every 1 to 2 years.   Lipid and cholesterol check.** / Every 5 years beginning at age 20.   Clinical breast exam.** / Every year after age 40.   Mammogram.** / Every year beginning at age 40 and continuing for as  long as you are in good health. Consult with your caregiver.   Pap test.** / Every 3 years starting at age 30 through age 65 or 70 with a history of 3 consecutive normal Pap tests.   HPV screening.** / Every 3 years from ages 30 through ages 65 to 70 with a history of 3 consecutive normal Pap tests.   Fecal occult blood test (FOBT) of stool. / Every year beginning at age 50 and continuing until age 75. You may not need to do this test if you get a colonoscopy every 10 years.   Flexible sigmoidoscopy or colonoscopy.** / Every 5 years for a flexible sigmoidoscopy or every 10 years for a colonoscopy beginning at age 50 and continuing until age 75.   Hepatitis C blood test.** / For all people born from 1945 through 1965 and any individual with known risks for hepatitis C.   Skin self-exam. / Monthly.   Influenza immunization.** / Every year.   Pneumococcal polysaccharide immunization.** / 1 to 2 doses if you smoke cigarettes or if you have certain chronic medical conditions.   Tetanus, diphtheria, pertussis (Tdap, Td) immunization.** / A one-time dose of Tdap vaccine. After that, you need a Td booster dose every 10 years.   Measles, mumps, rubella (MMR) immunization. / You need at least 1 dose of MMR if you were born in 1957 or later. You may also need a second dose.   Varicella immunization.** / Consult your caregiver.   Meningococcal immunization.** / Consult your caregiver.   Hepatitis A immunization.** / Consult your caregiver. 2 doses, 6 to 18 months apart.   Hepatitis B immunization.** / Consult your caregiver. 3 doses, usually over 6 months.  Ages 65 and over  Blood pressure check.** / Every 1 to 2 years.   Lipid and cholesterol check.** / Every 5 years beginning at age 20.   Clinical breast exam.** / Every year after age 40.   Mammogram.** / Every year beginning at age 40 and continuing for as long as you are in good health. Consult with your caregiver.   Pap test.** /  Every 3 years starting at age 30 through age 65 or 70 with a 3 consecutive normal Pap tests. Testing can be stopped between 65 and 70 with 3 consecutive normal Pap tests and no abnormal Pap or HPV tests in the past 10 years.   HPV screening.** / Every 3 years from ages 30 through ages 65 or 70 with a history of 3 consecutive normal Pap tests. Testing can be stopped between 65 and 70 with 3 consecutive normal Pap tests and no abnormal Pap or HPV tests in the past 10 years.   Fecal occult blood test (FOBT) of stool. / Every year beginning at age 50 and continuing until age 75. You may not need to do this test if you get a colonoscopy every 10 years.   Flexible sigmoidoscopy or colonoscopy.** / Every 5 years for a flexible sigmoidoscopy or every 10 years for a colonoscopy beginning at age 50 and continuing until age 75.   Hepatitis   C blood test.** / For all people born from 1945 through 1965 and any individual with known risks for hepatitis C.   Osteoporosis screening.** / A one-time screening for women ages 65 and over and women at risk for fractures or osteoporosis.   Skin self-exam. / Monthly.   Influenza immunization.** / Every year.   Pneumococcal polysaccharide immunization.** / 1 dose at age 65 (or older) if you have never been vaccinated.   Tetanus, diphtheria, pertussis (Tdap, Td) immunization. / A one-time dose of Tdap vaccine if you are over 65 and have contact with an infant, are a healthcare worker, or simply want to be protected from whooping cough. After that, you need a Td booster dose every 10 years.   Varicella immunization.** / Consult your caregiver.   Meningococcal immunization.** / Consult your caregiver.   Hepatitis A immunization.** / Consult your caregiver. 2 doses, 6 to 18 months apart.   Hepatitis B immunization.** / Check with your caregiver. 3 doses, usually over 6 months.  ** Family history and personal history of risk and conditions may change your caregiver's  recommendations. Document Released: 08/22/2001 Document Revised: 06/15/2011 Document Reviewed: 11/21/2010 [employer] Patient Information 2012 [employer]. 

## 2011-09-15 LAB — URINE CULTURE: Colony Count: 5000

## 2011-09-18 ENCOUNTER — Telehealth: Payer: Self-pay

## 2011-09-18 NOTE — Telephone Encounter (Signed)
Patient returning your call regarding results

## 2011-09-19 NOTE — Telephone Encounter (Signed)
msg left to call the office     KP 

## 2012-01-25 DIAGNOSIS — H524 Presbyopia: Secondary | ICD-10-CM | POA: Insufficient documentation

## 2012-02-29 DIAGNOSIS — H5210 Myopia, unspecified eye: Secondary | ICD-10-CM | POA: Insufficient documentation

## 2012-06-14 ENCOUNTER — Telehealth: Payer: Self-pay | Admitting: *Deleted

## 2012-06-14 ENCOUNTER — Encounter: Payer: Self-pay | Admitting: Family

## 2012-06-14 ENCOUNTER — Ambulatory Visit (INDEPENDENT_AMBULATORY_CARE_PROVIDER_SITE_OTHER): Payer: 59 | Admitting: Family

## 2012-06-14 ENCOUNTER — Ambulatory Visit (HOSPITAL_BASED_OUTPATIENT_CLINIC_OR_DEPARTMENT_OTHER)
Admission: RE | Admit: 2012-06-14 | Discharge: 2012-06-14 | Disposition: A | Discharge status: Home / Self Care | Payer: 59 | Source: Ambulatory Visit | Attending: Family | Admitting: Family

## 2012-06-14 VITALS — BP 150/92 | HR 82 | Temp 99.2°F | Resp 16 | Wt 113.1 lb

## 2012-06-14 DIAGNOSIS — R062 Wheezing: Secondary | ICD-10-CM | POA: Insufficient documentation

## 2012-06-14 DIAGNOSIS — I10 Essential (primary) hypertension: Secondary | ICD-10-CM

## 2012-06-14 DIAGNOSIS — R05 Cough: Secondary | ICD-10-CM

## 2012-06-14 DIAGNOSIS — R059 Cough, unspecified: Secondary | ICD-10-CM

## 2012-06-14 DIAGNOSIS — J40 Bronchitis, not specified as acute or chronic: Secondary | ICD-10-CM

## 2012-06-14 MED ORDER — ALBUTEROL SULFATE HFA 108 (90 BASE) MCG/ACT IN AERS: 2.0000 | INHALATION_SPRAY | Freq: Four times a day (QID) | RESPIRATORY_TRACT | Status: DC | PRN

## 2012-06-14 MED ORDER — AZITHROMYCIN 250 MG PO TABS: ORAL_TABLET | ORAL | Status: DC

## 2012-06-14 NOTE — Progress Notes (Signed)
  Subjective:    Patient ID: [name], female    DOB: May 02, 1956, 56 y.o.   MRN: [id number]  HPI  [name] is a 56 yr old female who presents today with chief complaint of sore throat. Started 3 days ago.  Now has chest congestion (coughing up green/yellow).  Mild myalgia.  Sore throat is resolved.  Energy is poor.  She did have flu shot this season >1 month ago.  She reports some sick contacts at work.    Review of Systems See HPI  Past Medical History  Diagnosis Date  . Hypertension   . Anxiety   . Depression   . Substance abuse     History   Social History  . Marital Status: Divorced    Spouse Name: N/A    Number of Children: N/A  . Years of Education: N/A   Occupational History  . [employer]    Social History Main Topics  . Smoking status: Never Smoker   . Smokeless tobacco: Never Used  . Alcohol Use: No     Comment: no drink in 3 weeks  . Drug Use: No  . Sexually Active: Not Currently -- Female partner(s)   Other Topics Concern  . Not on file   Social History Narrative   Exercise--- gyn 5x a week    Past Surgical History  Procedure Date  . Nose surgery     Epistaxis    Family History  Problem Relation Age of Onset  . Hypertension Mother   . Kidney disease Mother     dialysis  . Hyperlipidemia Father     Allergies  Allergen Reactions  . Codeine     REACTION: insomnia and anxious    Current Outpatient Prescriptions on File Prior to Visit  Medication Sig Dispense Refill  . lisinopril (PRINIVIL,ZESTRIL) 10 MG tablet 1 po qd  90 tablet  3    BP 150/92  Pulse 82  Temp 99.2 F (37.3 C) (Oral)  Resp 16  Wt 113 lb 1.3 oz (51.293 kg)  SpO2 99%       Objective:   Physical Exam  Constitutional: She is oriented to person, place, and time. She appears well-developed and well-nourished. No distress.  HENT:  Head: Normocephalic and atraumatic.  Right Ear: Tympanic membrane and ear canal normal.  Left Ear: Tympanic membrane and ear  canal normal.  Mouth/Throat: Posterior oropharyngeal erythema present. No oropharyngeal exudate or posterior oropharyngeal edema.  Cardiovascular: Normal rate and regular rhythm.   No murmur heard. Pulmonary/Chest: Effort normal and breath sounds normal. No respiratory distress. She has no wheezes. She has no rales. She exhibits no tenderness.  Musculoskeletal: She exhibits no edema.  Neurological: She is alert and oriented to person, place, and time.  Psychiatric: She has a normal mood and affect. Her behavior is normal. Judgment and thought content normal.          Assessment & Plan:

## 2012-06-14 NOTE — Assessment & Plan Note (Signed)
CXR is negative for pneumonia.  Left detailed message on answering machine re: neg chest x-ray and that we sent rx for zithromax to her pharmacy to treat her for bronchitis.

## 2012-06-14 NOTE — Telephone Encounter (Signed)
Received call from pt requesting cxr results. Per verbal from Provider, advised pt that cxr negative for pneumonia. She has sent zpack and albuterol to treat her for bronchitis. Pt voices understanding.

## 2012-06-14 NOTE — Assessment & Plan Note (Signed)
BP is up today. She reports + compliance with lisinopril, white coat htn and use of sudafed a few days ago.  I have asked her to follow up with Dr. Laury Axon in 1 month for BP check.

## 2012-06-14 NOTE — Patient Instructions (Addendum)
Please complete your chest x-ray on the first floor.  Call if symptoms worsen or if no improvement in 2-3 days. Follow up with Dr. Laury Axon in 1 month for BP check.

## 2012-07-15 ENCOUNTER — Other Ambulatory Visit: Payer: Self-pay | Admitting: Family Medicine

## 2012-07-15 DIAGNOSIS — Z1231 Encounter for screening mammogram for malignant neoplasm of breast: Secondary | ICD-10-CM

## 2012-07-15 DIAGNOSIS — Z9882 Breast implant status: Secondary | ICD-10-CM

## 2012-08-21 ENCOUNTER — Ambulatory Visit
Admission: RE | Admit: 2012-08-21 | Discharge: 2012-08-21 | Disposition: A | Discharge status: Home / Self Care | Payer: 59 | Source: Ambulatory Visit | Attending: Family Medicine | Admitting: Family Medicine

## 2012-08-21 DIAGNOSIS — Z9882 Breast implant status: Secondary | ICD-10-CM

## 2012-08-21 DIAGNOSIS — Z1231 Encounter for screening mammogram for malignant neoplasm of breast: Secondary | ICD-10-CM

## 2012-09-12 ENCOUNTER — Other Ambulatory Visit: Payer: Self-pay | Admitting: Family Medicine

## 2012-09-24 DIAGNOSIS — Z8669 Personal history of other diseases of the nervous system and sense organs: Secondary | ICD-10-CM | POA: Insufficient documentation

## 2012-09-24 DIAGNOSIS — H179 Unspecified corneal scar and opacity: Secondary | ICD-10-CM | POA: Insufficient documentation

## 2012-12-04 ENCOUNTER — Other Ambulatory Visit: Payer: Self-pay | Admitting: Family Medicine

## 2012-12-04 NOTE — Telephone Encounter (Signed)
Letter mailed to schedule CPE.    KP 

## 2013-02-15 ENCOUNTER — Other Ambulatory Visit: Payer: Self-pay | Admitting: Family Medicine

## 2013-03-17 ENCOUNTER — Encounter: Payer: 59 | Admitting: Family Medicine

## 2013-03-27 ENCOUNTER — Telehealth: Payer: Self-pay

## 2013-03-27 NOTE — Telephone Encounter (Signed)
LVM for call back

## 2013-03-28 ENCOUNTER — Ambulatory Visit (INDEPENDENT_AMBULATORY_CARE_PROVIDER_SITE_OTHER): Payer: BC Managed Care – PPO | Admitting: Family Medicine

## 2013-03-28 ENCOUNTER — Encounter: Payer: Self-pay | Admitting: Family Medicine

## 2013-03-28 ENCOUNTER — Other Ambulatory Visit (HOSPITAL_COMMUNITY)
Admission: RE | Admit: 2013-03-28 | Discharge: 2013-03-28 | Disposition: A | Discharge status: Home / Self Care | Payer: BC Managed Care – PPO | Source: Ambulatory Visit | Attending: Family Medicine | Admitting: Family Medicine

## 2013-03-28 VITALS — BP 120/76 | HR 79 | Temp 98.1°F | Ht 61.0 in | Wt 109.8 lb

## 2013-03-28 DIAGNOSIS — Z01419 Encounter for gynecological examination (general) (routine) without abnormal findings: Secondary | ICD-10-CM | POA: Insufficient documentation

## 2013-03-28 DIAGNOSIS — I10 Essential (primary) hypertension: Secondary | ICD-10-CM

## 2013-03-28 DIAGNOSIS — Z0000 Encounter for general adult medical examination without abnormal findings: Secondary | ICD-10-CM

## 2013-03-28 DIAGNOSIS — Z23 Encounter for immunization: Secondary | ICD-10-CM

## 2013-03-28 DIAGNOSIS — Z124 Encounter for screening for malignant neoplasm of cervix: Secondary | ICD-10-CM

## 2013-03-28 DIAGNOSIS — Z1151 Encounter for screening for human papillomavirus (HPV): Secondary | ICD-10-CM | POA: Insufficient documentation

## 2013-03-28 DIAGNOSIS — Z1331 Encounter for screening for depression: Secondary | ICD-10-CM

## 2013-03-28 LAB — HEPATIC FUNCTION PANEL
ALT: 12 U/L (ref 0–35)
Albumin: 4.5 g/dL (ref 3.5–5.2)
Total Protein: 7.3 g/dL (ref 6.0–8.3)

## 2013-03-28 LAB — CBC WITH DIFFERENTIAL/PLATELET
Basophils Absolute: 0 10*3/uL (ref 0.0–0.1)
Eosinophils Absolute: 0.2 10*3/uL (ref 0.0–0.7)
Hemoglobin: 14 g/dL (ref 12.0–15.0)
Lymphocytes Relative: 25.5 % (ref 12.0–46.0)
Monocytes Relative: 9.4 % (ref 3.0–12.0)
Neutro Abs: 3.1 10*3/uL (ref 1.4–7.7)
Neutrophils Relative %: 60.7 % (ref 43.0–77.0)
RBC: 4.01 Mil/uL (ref 3.87–5.11)
RDW: 12 % (ref 11.5–14.6)

## 2013-03-28 LAB — MICROALBUMIN / CREATININE URINE RATIO
Creatinine,U: 51.5 mg/dL
Microalb Creat Ratio: 0.4 mg/g (ref 0.0–30.0)
Microalb, Ur: 0.2 mg/dL (ref 0.0–1.9)

## 2013-03-28 LAB — POCT URINALYSIS DIPSTICK
Bilirubin, UA: NEGATIVE
Blood, UA: NEGATIVE
Leukocytes, UA: NEGATIVE
Nitrite, UA: NEGATIVE
Protein, UA: NEGATIVE
Urobilinogen, UA: 0.2
pH, UA: 6.5

## 2013-03-28 LAB — BASIC METABOLIC PANEL
Calcium: 9.7 mg/dL (ref 8.4–10.5)
Creatinine, Ser: 0.8 mg/dL (ref 0.4–1.2)
GFR: 82.35 mL/min (ref >60.00)
Glucose, Bld: 83 mg/dL (ref 70–99)
Sodium: 130 mEq/L — ABNORMAL LOW (ref 135–145)

## 2013-03-28 LAB — LDL CHOLESTEROL, DIRECT: Direct LDL: 127.6 mg/dL

## 2013-03-28 LAB — LIPID PANEL
Cholesterol: 205 mg/dL — ABNORMAL HIGH (ref 0–200)
HDL: 64.6 mg/dL (ref >39.00)
Triglycerides: 72 mg/dL (ref 0.0–149.0)

## 2013-03-28 MED ORDER — LISINOPRIL 10 MG PO TABS: ORAL_TABLET | ORAL | Status: DC

## 2013-03-28 NOTE — Addendum Note (Signed)
Addended by: Arnette Norris on: 03/28/2013 01:42 PM   Modules accepted: Orders

## 2013-03-28 NOTE — Assessment & Plan Note (Signed)
Stable   meds refilled

## 2013-03-28 NOTE — Telephone Encounter (Signed)
Patient did not return previsit call.

## 2013-03-28 NOTE — Patient Instructions (Addendum)
Preventive Care for Adults, Female A healthy lifestyle and preventive care can promote health and wellness. Preventive health guidelines for women include the following key practices.  A routine yearly physical is a good way to check with your caregiver about your health and preventive screening. It is a chance to share any concerns and updates on your health, and to receive a thorough exam.  Visit your dentist for a routine exam and preventive care every 6 months. Brush your teeth twice a day and floss once a day. Good oral hygiene prevents tooth decay and gum disease.  The frequency of eye exams is based on your age, health, family medical history, use of contact lenses, and other factors. Follow your caregiver's recommendations for frequency of eye exams.  Eat a healthy diet. Foods like vegetables, fruits, whole grains, low-fat dairy products, and lean protein foods contain the nutrients you need without too many calories. Decrease your intake of foods high in solid fats, added sugars, and salt. Eat the right amount of calories for you.Get information about a proper diet from your caregiver, if necessary.  Regular physical exercise is one of the most important things you can do for your health. Most adults should get at least 150 minutes of moderate-intensity exercise (any activity that increases your heart rate and causes you to sweat) each week. In addition, most adults need muscle-strengthening exercises on 2 or more days a week.  Maintain a healthy weight. The body mass index (BMI) is a screening tool to identify possible weight problems. It provides an estimate of body fat based on height and weight. Your caregiver can help determine your BMI, and can help you achieve or maintain a healthy weight.For adults 20 years and older:  A BMI below 18.5 is considered underweight.  A BMI of 18.5 to 24.9 is normal.  A BMI of 25 to 29.9 is considered overweight.  A BMI of 30 and above is  considered obese.  Maintain normal blood lipids and cholesterol levels by exercising and minimizing your intake of saturated fat. Eat a balanced diet with plenty of fruit and vegetables. Blood tests for lipids and cholesterol should begin at age 20 and be repeated every 5 years. If your lipid or cholesterol levels are high, you are over 50, or you are at high risk for heart disease, you may need your cholesterol levels checked more frequently.Ongoing high lipid and cholesterol levels should be treated with medicines if diet and exercise are not effective.  If you smoke, find out from your caregiver how to quit. If you do not use tobacco, do not start.  If you are pregnant, do not drink alcohol. If you are breastfeeding, be very cautious about drinking alcohol. If you are not pregnant and choose to drink alcohol, do not exceed 1 drink per day. One drink is considered to be 12 ounces (355 mL) of beer, 5 ounces (148 mL) of wine, or 1.5 ounces (44 mL) of liquor.  Avoid use of street drugs. Do not share needles with anyone. Ask for help if you need support or instructions about stopping the use of drugs.  High blood pressure causes heart disease and increases the risk of stroke. Your blood pressure should be checked at least every 1 to 2 years. Ongoing high blood pressure should be treated with medicines if weight loss and exercise are not effective.  If you are 55 to 56 years old, ask your caregiver if you should take aspirin to prevent strokes.  Diabetes   screening involves taking a blood sample to check your fasting blood sugar level. This should be done once every 3 years, after age 45, if you are within normal weight and without risk factors for diabetes. Testing should be considered at a younger age or be carried out more frequently if you are overweight and have at least 1 risk factor for diabetes.  Breast cancer screening is essential preventive care for women. You should practice "breast  self-awareness." This means understanding the normal appearance and feel of your breasts and may include breast self-examination. Any changes detected, no matter how small, should be reported to a caregiver. Women in their 20s and 30s should have a clinical breast exam (CBE) by a caregiver as part of a regular health exam every 1 to 3 years. After age 40, women should have a CBE every year. Starting at age 40, women should consider having a mammography (breast X-ray test) every year. Women who have a family history of breast cancer should talk to their caregiver about genetic screening. Women at a high risk of breast cancer should talk to their caregivers about having magnetic resonance imaging (MRI) and a mammography every year.  The Pap test is a screening test for cervical cancer. A Pap test can show cell changes on the cervix that might become cervical cancer if left untreated. A Pap test is a procedure in which cells are obtained and examined from the lower end of the uterus (cervix).  Women should have a Pap test starting at age 21.  Between ages 21 and 29, Pap tests should be repeated every 2 years.  Beginning at age 30, you should have a Pap test every 3 years as long as the past 3 Pap tests have been normal.  Some women have medical problems that increase the chance of getting cervical cancer. Talk to your caregiver about these problems. It is especially important to talk to your caregiver if a new problem develops soon after your last Pap test. In these cases, your caregiver may recommend more frequent screening and Pap tests.  The above recommendations are the same for women who have or have not gotten the vaccine for human papillomavirus (HPV).  If you had a hysterectomy for a problem that was not cancer or a condition that could lead to cancer, then you no longer need Pap tests. Even if you no longer need a Pap test, a regular exam is a good idea to make sure no other problems are  starting.  If you are between ages 65 and 70, and you have had normal Pap tests going back 10 years, you no longer need Pap tests. Even if you no longer need a Pap test, a regular exam is a good idea to make sure no other problems are starting.  If you have had past treatment for cervical cancer or a condition that could lead to cancer, you need Pap tests and screening for cancer for at least 20 years after your treatment.  If Pap tests have been discontinued, risk factors (such as a new sexual partner) need to be reassessed to determine if screening should be resumed.  The HPV test is an additional test that may be used for cervical cancer screening. The HPV test looks for the virus that can cause the cell changes on the cervix. The cells collected during the Pap test can be tested for HPV. The HPV test could be used to screen women aged 30 years and older, and should   be used in women of any age who have unclear Pap test results. After the age of 30, women should have HPV testing at the same frequency as a Pap test.  Colorectal cancer can be detected and often prevented. Most routine colorectal cancer screening begins at the age of 50 and continues through age 75. However, your caregiver may recommend screening at an earlier age if you have risk factors for colon cancer. On a yearly basis, your caregiver may provide home test kits to check for hidden blood in the stool. Use of a small camera at the end of a tube, to directly examine the colon (sigmoidoscopy or colonoscopy), can detect the earliest forms of colorectal cancer. Talk to your caregiver about this at age 50, when routine screening begins. Direct examination of the colon should be repeated every 5 to 10 years through age 75, unless early forms of pre-cancerous polyps or small growths are found.  Hepatitis C blood testing is recommended for all people born from 1945 through 1965 and any individual with known risks for hepatitis C.  Practice  safe sex. Use condoms and avoid high-risk sexual practices to reduce the spread of sexually transmitted infections (STIs). STIs include gonorrhea, chlamydia, syphilis, trichomonas, herpes, HPV, and human immunodeficiency virus (HIV). Herpes, HIV, and HPV are viral illnesses that have no cure. They can result in disability, cancer, and death. Sexually active women aged 25 and younger should be checked for chlamydia. Older women with new or multiple partners should also be tested for chlamydia. Testing for other STIs is recommended if you are sexually active and at increased risk.  Osteoporosis is a disease in which the bones lose minerals and strength with aging. This can result in serious bone fractures. The risk of osteoporosis can be identified using a bone density scan. Women ages 65 and over and women at risk for fractures or osteoporosis should discuss screening with their caregivers. Ask your caregiver whether you should take a calcium supplement or vitamin D to reduce the rate of osteoporosis.  Menopause can be associated with physical symptoms and risks. Hormone replacement therapy is available to decrease symptoms and risks. You should talk to your caregiver about whether hormone replacement therapy is right for you.  Use sunscreen with sun protection factor (SPF) of 30 or more. Apply sunscreen liberally and repeatedly throughout the day. You should seek shade when your shadow is shorter than you. Protect yourself by wearing long sleeves, pants, a wide-brimmed hat, and sunglasses year round, whenever you are outdoors.  Once a month, do a whole body skin exam, using a mirror to look at the skin on your back. Notify your caregiver of new moles, moles that have irregular borders, moles that are larger than a pencil eraser, or moles that have changed in shape or color.  Stay current with required immunizations.  Influenza. You need a dose every fall (or winter). The composition of the flu vaccine  changes each year, so being vaccinated once is not enough.  Pneumococcal polysaccharide. You need 1 to 2 doses if you smoke cigarettes or if you have certain chronic medical conditions. You need 1 dose at age 65 (or older) if you have never been vaccinated.  Tetanus, diphtheria, pertussis (Tdap, Td). Get 1 dose of Tdap vaccine if you are younger than age 65, are over 65 and have contact with an infant, are a healthcare worker, are pregnant, or simply want to be protected from whooping cough. After that, you need a Td   booster dose every 10 years. Consult your caregiver if you have not had at least 3 tetanus and diphtheria-containing shots sometime in your life or have a deep or dirty wound.  HPV. You need this vaccine if you are a woman age 26 or younger. The vaccine is given in 3 doses over 6 months.  Measles, mumps, rubella (MMR). You need at least 1 dose of MMR if you were born in 1957 or later. You may also need a second dose.  Meningococcal. If you are age 19 to 21 and a first-year college student living in a residence hall, or have one of several medical conditions, you need to get vaccinated against meningococcal disease. You may also need additional booster doses.  Zoster (shingles). If you are age 60 or older, you should get this vaccine.  Varicella (chickenpox). If you have never had chickenpox or you were vaccinated but received only 1 dose, talk to your caregiver to find out if you need this vaccine.  Hepatitis A. You need this vaccine if you have a specific risk factor for hepatitis A virus infection or you simply wish to be protected from this disease. The vaccine is usually given as 2 doses, 6 to 18 months apart.  Hepatitis B. You need this vaccine if you have a specific risk factor for hepatitis B virus infection or you simply wish to be protected from this disease. The vaccine is given in 3 doses, usually over 6 months. Preventive Services / Frequency Ages 19 to 39  Blood  pressure check.** / Every 1 to 2 years.  Lipid and cholesterol check.** / Every 5 years beginning at age 20.  Clinical breast exam.** / Every 3 years for women in their 20s and 30s.  Pap test.** / Every 2 years from ages 21 through 29. Every 3 years starting at age 30 through age 65 or 70 with a history of 3 consecutive normal Pap tests.  HPV screening.** / Every 3 years from ages 30 through ages 65 to 70 with a history of 3 consecutive normal Pap tests.  Hepatitis C blood test.** / For any individual with known risks for hepatitis C.  Skin self-exam. / Monthly.  Influenza immunization.** / Every year.  Pneumococcal polysaccharide immunization.** / 1 to 2 doses if you smoke cigarettes or if you have certain chronic medical conditions.  Tetanus, diphtheria, pertussis (Tdap, Td) immunization. / A one-time dose of Tdap vaccine. After that, you need a Td booster dose every 10 years.  HPV immunization. / 3 doses over 6 months, if you are 26 and younger.  Measles, mumps, rubella (MMR) immunization. / You need at least 1 dose of MMR if you were born in 1957 or later. You may also need a second dose.  Meningococcal immunization. / 1 dose if you are age 19 to 21 and a first-year college student living in a residence hall, or have one of several medical conditions, you need to get vaccinated against meningococcal disease. You may also need additional booster doses.  Varicella immunization.** / Consult your caregiver.  Hepatitis A immunization.** / Consult your caregiver. 2 doses, 6 to 18 months apart.  Hepatitis B immunization.** / Consult your caregiver. 3 doses usually over 6 months. Ages 40 to 64  Blood pressure check.** / Every 1 to 2 years.  Lipid and cholesterol check.** / Every 5 years beginning at age 20.  Clinical breast exam.** / Every year after age 40.  Mammogram.** / Every year beginning at age 40   and continuing for as long as you are in good health. Consult with your  caregiver.  Pap test.** / Every 3 years starting at age 30 through age 65 or 70 with a history of 3 consecutive normal Pap tests.  HPV screening.** / Every 3 years from ages 30 through ages 65 to 70 with a history of 3 consecutive normal Pap tests.  Fecal occult blood test (FOBT) of stool. / Every year beginning at age 50 and continuing until age 75. You may not need to do this test if you get a colonoscopy every 10 years.  Flexible sigmoidoscopy or colonoscopy.** / Every 5 years for a flexible sigmoidoscopy or every 10 years for a colonoscopy beginning at age 50 and continuing until age 75.  Hepatitis C blood test.** / For all people born from 1945 through 1965 and any individual with known risks for hepatitis C.  Skin self-exam. / Monthly.  Influenza immunization.** / Every year.  Pneumococcal polysaccharide immunization.** / 1 to 2 doses if you smoke cigarettes or if you have certain chronic medical conditions.  Tetanus, diphtheria, pertussis (Tdap, Td) immunization.** / A one-time dose of Tdap vaccine. After that, you need a Td booster dose every 10 years.  Measles, mumps, rubella (MMR) immunization. / You need at least 1 dose of MMR if you were born in 1957 or later. You may also need a second dose.  Varicella immunization.** / Consult your caregiver.  Meningococcal immunization.** / Consult your caregiver.  Hepatitis A immunization.** / Consult your caregiver. 2 doses, 6 to 18 months apart.  Hepatitis B immunization.** / Consult your caregiver. 3 doses, usually over 6 months. Ages 65 and over  Blood pressure check.** / Every 1 to 2 years.  Lipid and cholesterol check.** / Every 5 years beginning at age 20.  Clinical breast exam.** / Every year after age 40.  Mammogram.** / Every year beginning at age 40 and continuing for as long as you are in good health. Consult with your caregiver.  Pap test.** / Every 3 years starting at age 30 through age 65 or 70 with a 3  consecutive normal Pap tests. Testing can be stopped between 65 and 70 with 3 consecutive normal Pap tests and no abnormal Pap or HPV tests in the past 10 years.  HPV screening.** / Every 3 years from ages 30 through ages 65 or 70 with a history of 3 consecutive normal Pap tests. Testing can be stopped between 65 and 70 with 3 consecutive normal Pap tests and no abnormal Pap or HPV tests in the past 10 years.  Fecal occult blood test (FOBT) of stool. / Every year beginning at age 50 and continuing until age 75. You may not need to do this test if you get a colonoscopy every 10 years.  Flexible sigmoidoscopy or colonoscopy.** / Every 5 years for a flexible sigmoidoscopy or every 10 years for a colonoscopy beginning at age 50 and continuing until age 75.  Hepatitis C blood test.** / For all people born from 1945 through 1965 and any individual with known risks for hepatitis C.  Osteoporosis screening.** / A one-time screening for women ages 65 and over and women at risk for fractures or osteoporosis.  Skin self-exam. / Monthly.  Influenza immunization.** / Every year.  Pneumococcal polysaccharide immunization.** / 1 dose at age 65 (or older) if you have never been vaccinated.  Tetanus, diphtheria, pertussis (Tdap, Td) immunization. / A one-time dose of Tdap vaccine if you are over   65 and have contact with an infant, are a healthcare worker, or simply want to be protected from whooping cough. After that, you need a Td booster dose every 10 years.  Varicella immunization.** / Consult your caregiver.  Meningococcal immunization.** / Consult your caregiver.  Hepatitis A immunization.** / Consult your caregiver. 2 doses, 6 to 18 months apart.  Hepatitis B immunization.** / Check with your caregiver. 3 doses, usually over 6 months. ** Family history and personal history of risk and conditions may change your caregiver's recommendations. Document Released: 08/22/2001 Document Revised: 09/18/2011  Document Reviewed: 11/21/2010 [employer] Patient Information 2014 [employer].  

## 2013-03-28 NOTE — Progress Notes (Signed)
Subjective:     [name] is a 57 y.o. female and is here for a comprehensive physical exam. The patient reports no problems.  History   Social History  . Marital Status: Divorced    Spouse Name: N/A    Number of Children: N/A  . Years of Education: N/A   Occupational History  . [employer]    Social History Main Topics  . Smoking status: Never Smoker   . Smokeless tobacco: Never Used  . Alcohol Use: No     Comment: rare-- 2 x a month  . Drug Use: No  . Sexual Activity: Not Currently    Partners: Male   Other Topics Concern  . Not on file   Social History Narrative   Exercise--- gym 6x a week   Health Maintenance  Topic Date Due  . Tetanus/tdap  10/20/2013  . Influenza Vaccine  02/07/2014  . Mammogram  08/21/2014  . Pap Smear  03/28/2016  . Colonoscopy  11/03/2017    The following portions of the patient's history were reviewed and updated as appropriate:  She  has a past medical history of Hypertension; Anxiety; Depression; and Substance abuse. She  does not have any pertinent problems on file. She  has past surgical history that includes Nose surgery. Her family history includes Alzheimer's disease in her mother; Hyperlipidemia in her father; Hypertension in her mother; Kidney disease in her mother. She  reports that she has never smoked. She has never used smokeless tobacco. She reports that she does not drink alcohol or use illicit drugs. She has a current medication list which includes the following prescription(s): lisinopril. No current outpatient prescriptions on file prior to visit.   No current facility-administered medications on file prior to visit.   She is allergic to codeine..  Review of Systems Review of Systems  Constitutional: Negative for activity change, appetite change and fatigue.  HENT: positive for hearing loss,  Neg congestion, tinnitus and ear discharge.  dentist q57m Eyes: Negative for visual disturbance (see optho q1y --  vision corrected to 20/20 with glasses).  Respiratory: Negative for cough, chest tightness and shortness of breath.   Cardiovascular: Negative for chest pain, palpitations and leg swelling.  Gastrointestinal: Negative for abdominal pain, diarrhea, constipation and abdominal distention.  Genitourinary: Negative for urgency, frequency, decreased urine volume and difficulty urinating.  Musculoskeletal: Negative for back pain, arthralgias and gait problem.  Skin: Negative for color change, pallor and rash.  Neurological: Negative for dizziness, light-headedness, numbness and headaches.  Hematological: Negative for adenopathy. Does not bruise/bleed easily.  Psychiatric/Behavioral: Negative for suicidal ideas, confusion, sleep disturbance, self-injury, dysphoric mood, decreased concentration and agitation.       Objective:    BP 120/76  Pulse 79  Temp(Src) 98.1 F (36.7 C) (Oral)  Ht 5\' 1"  (1.549 m)  Wt 109 lb 12.8 oz (49.805 kg)  BMI 20.76 kg/m2 General appearance: alert, cooperative, appears stated age and no distress Head: Normocephalic, without obvious abnormality, atraumatic Eyes: conjunctivae/corneas clear. PERRL, EOM's intact. Fundi benign. Ears: normal TM's and external ear canals both ears Nose: Nares normal. Septum midline. Mucosa normal. No drainage or sinus tenderness. Throat: lips, mucosa, and tongue normal; teeth and gums normal Neck: no adenopathy, no carotid bruit, no JVD, supple, symmetrical, trachea midline and thyroid not enlarged, symmetric, no tenderness/mass/nodules Back: symmetric, no curvature. ROM normal. No CVA tenderness. Lungs: clear to auscultation bilaterally Breasts: normal appearance, no masses or tenderness Heart: regular rate and rhythm, S1, S2 normal,  no murmur, click, rub or gallop Abdomen: soft, non-tender; bowel sounds normal; no masses,  no organomegaly Pelvic: cervix normal in appearance, external genitalia normal, no adnexal masses or tenderness,  no cervical motion tenderness, rectovaginal septum normal, uterus normal size, shape, and consistency, vagina normal without discharge and pap done--rectal heme - brown stool Extremities: extremities normal, atraumatic, no cyanosis or edema Pulses: 2+ and symmetric Skin: Skin color, texture, turgor normal. No rashes or lesions Lymph nodes: Cervical, supraclavicular, and axillary nodes normal. Neurologic: Alert and oriented X 3, normal strength and tone. Normal symmetric reflexes. Normal coordination and gait Psych-- no depression, anxiety      Assessment:    Healthy female exam.      Plan:    ghm utd Check labs See After Visit Summary for Counseling Recommendations

## 2013-05-07 ENCOUNTER — Encounter: Payer: BC Managed Care – PPO | Admitting: *Deleted

## 2013-05-07 NOTE — Progress Notes (Deleted)
Patient was unable to receive TB test today due to her not able to return on the day that it is suppose to be read. Patient states that she will reschedule for one day next week.

## 2013-05-12 ENCOUNTER — Ambulatory Visit (INDEPENDENT_AMBULATORY_CARE_PROVIDER_SITE_OTHER): Payer: BC Managed Care – PPO | Admitting: *Deleted

## 2013-05-12 DIAGNOSIS — IMO0001 Reserved for inherently not codable concepts without codable children: Secondary | ICD-10-CM

## 2013-05-12 DIAGNOSIS — A184 Tuberculosis of skin and subcutaneous tissue: Secondary | ICD-10-CM

## 2013-05-15 ENCOUNTER — Encounter: Payer: Self-pay | Admitting: *Deleted

## 2013-08-06 ENCOUNTER — Other Ambulatory Visit: Payer: Self-pay

## 2013-08-06 DIAGNOSIS — Z1231 Encounter for screening mammogram for malignant neoplasm of breast: Secondary | ICD-10-CM

## 2013-09-08 ENCOUNTER — Ambulatory Visit
Admission: RE | Admit: 2013-09-08 | Discharge: 2013-09-08 | Disposition: A | Discharge status: Home / Self Care | Payer: BC Managed Care – PPO | Source: Ambulatory Visit

## 2013-09-08 ENCOUNTER — Other Ambulatory Visit: Payer: Self-pay

## 2013-09-08 DIAGNOSIS — Z1231 Encounter for screening mammogram for malignant neoplasm of breast: Secondary | ICD-10-CM

## 2014-02-16 ENCOUNTER — Telehealth: Payer: Self-pay

## 2014-02-16 NOTE — Telephone Encounter (Signed)
F/u with her tomorrow-- if she was put on meds-- f/u here in 2--3 weeks -- if no meds started F/u here in next 2 days

## 2014-02-16 NOTE — Telephone Encounter (Signed)
RN from [facility] called to tell us about [name] blood pressure readings while she was having her physical for new job. Blood Pressure machine 178/104, 171/105 and manual was 180/108, she also stated that it was high couple weeks ago when she had some dental surgery. I spoke with Caryl Pina and she told me to see if any appointments were available for [name] and if there were not any to send her to Urgent Care. There were not any appointments so she is going Urgent Care

## 2014-02-16 NOTE — Telephone Encounter (Signed)
FYI for MD

## 2014-02-17 ENCOUNTER — Telehealth: Payer: Self-pay | Admitting: Family Medicine

## 2014-02-17 NOTE — Telephone Encounter (Signed)
[phone]   Pt returned call.  Please call back before 12:10.   If not, patient gets off work at 2:30.

## 2014-02-17 NOTE — Telephone Encounter (Signed)
Pt is requesting a letter for pre employment regarding blood pressure stating she is okay to return back to work, pt was seen in urgent care 02/16/14 due to her blood pressure being so high. Please advise

## 2014-02-17 NOTE — Telephone Encounter (Signed)
MSG left to call the office      KP 

## 2014-02-17 NOTE — Telephone Encounter (Signed)
Spoke with patient and she can only come in after 2:30, apt scheduled 02/27/14 at 2:30. KP

## 2014-02-17 NOTE — Telephone Encounter (Signed)
Spoke with patient and she can only come in after 2:30, apt scheduled 02/27/14 at 2:30.     KP

## 2014-02-17 NOTE — Telephone Encounter (Signed)
Rosalita Chessman, DO at 02/16/2014 5:40 PM     Status: Signed        F/u with her tomorrow-- if she was put on meds-- f/u here in 2--3 weeks -- if no meds started  F/u here in next 2 days

## 2014-02-17 NOTE — Telephone Encounter (Signed)
The patient has not been seen since 03/2013 so she would need to get a letter from the urgent care or we would be more than happy to see her for a BP follow up.     KP

## 2014-02-26 ENCOUNTER — Telehealth: Payer: Self-pay

## 2014-02-26 NOTE — Telephone Encounter (Signed)
At scheduled for tomorrow.     KP

## 2014-02-26 NOTE — Telephone Encounter (Signed)
Received BMP results from [facility] and the the patient's sodium was low. Dr.Lowne wants to repeat her BMP for low sodium. MSG left to call the office    KP

## 2014-02-27 ENCOUNTER — Encounter: Payer: Self-pay | Admitting: Family Medicine

## 2014-02-27 ENCOUNTER — Telehealth: Payer: Self-pay

## 2014-02-27 ENCOUNTER — Ambulatory Visit (INDEPENDENT_AMBULATORY_CARE_PROVIDER_SITE_OTHER): Payer: BC Managed Care – PPO | Admitting: Family Medicine

## 2014-02-27 VITALS — BP 174/100 | HR 77 | Temp 98.0°F | Wt 111.4 lb

## 2014-02-27 DIAGNOSIS — E871 Hypo-osmolality and hyponatremia: Secondary | ICD-10-CM

## 2014-02-27 DIAGNOSIS — I10 Essential (primary) hypertension: Secondary | ICD-10-CM

## 2014-02-27 MED ORDER — LISINOPRIL 20 MG PO TABS: 20.0000 mg | ORAL_TABLET | Freq: Every day | ORAL | Status: DC

## 2014-02-27 MED ORDER — LISINOPRIL 40 MG PO TABS: 40.0000 mg | ORAL_TABLET | Freq: Every day | ORAL | Status: DC

## 2014-02-27 NOTE — Progress Notes (Signed)
  Subjective:    Patient here for follow-up of elevated blood pressure.  She is exercising and is adherent to a low-salt diet.  Blood pressure is not well controlled at home. Cardiac symptoms: none. Patient denies: chest pain, chest pressure/discomfort, claudication, dyspnea, exertional chest pressure/discomfort, fatigue, irregular heart beat, lower extremity edema, near-syncope, orthopnea, palpitations, paroxysmal nocturnal dyspnea, syncope and tachypnea. Cardiovascular risk factors: hypertension. Use of agents associated with hypertension: none. History of target organ damage: none.  The following portions of the patient's history were reviewed and updated as appropriate: allergies, current medications, past family history, past medical history, past social history, past surgical history and problem list.  Review of Systems Pertinent items are noted in HPI.     Objective:    BP 174/100  Pulse 77  Temp(Src) 98 F (36.7 C) (Oral)  Wt 111 lb 6.4 oz (50.531 kg)  SpO2 97% General appearance: alert, cooperative, appears stated age and no distress Neck: no adenopathy, supple, symmetrical, trachea midline and thyroid not enlarged, symmetric, no tenderness/mass/nodules Lungs: clear to auscultation bilaterally Heart: S1, S2 normal Extremities: extremities normal, atraumatic, no cyanosis or edema    Assessment:    Hypertension, . Evidence of target organ damage: none.    Plan:    Medication: increase to lisinopril 40 mg. Dietary sodium restriction. Regular aerobic exercise. Check blood pressures 2-3 times weekly and record. Follow up: 2 weeks and as needed.   1. Essential hypertension  - Basic metabolic panel - lisinopril (PRINIVIL,ZESTRIL) 40 MG tablet; Take 1 tablet (40 mg total) by mouth daily.  Dispense: 90 tablet; Refill: 3  2. Hyponatremia  Secondary to drinking a lot of water prior to the test and takeing a diuretic - Basic metabolic panel

## 2014-02-27 NOTE — Progress Notes (Signed)
Pre visit review using our clinic review tool, if applicable. No additional management support is needed unless otherwise documented below in the visit note. 

## 2014-02-27 NOTE — Telephone Encounter (Signed)
Spoke with Nicki Reaper and he will cancel the 20 mg Rx.      KP

## 2014-02-27 NOTE — Telephone Encounter (Signed)
Message copied by Ewing Schlein on Fri Feb 27, 2014  3:50 PM ------      Message from: Rosalita Chessman      Created: Fri Feb 27, 2014  3:23 PM       Please cancel lisinopril 20 mg at [facility] [address] ------

## 2014-02-27 NOTE — Patient Instructions (Signed)

## 2014-02-28 LAB — BASIC METABOLIC PANEL
BUN: 9 mg/dL (ref 6–23)
CALCIUM: 9.6 mg/dL (ref 8.4–10.5)
CO2: 26 meq/L (ref 19–32)
CREATININE: 0.73 mg/dL (ref 0.50–1.10)
Chloride: 93 mEq/L — ABNORMAL LOW (ref 96–112)
Glucose, Bld: 82 mg/dL (ref 70–99)
Potassium: 4.3 mEq/L (ref 3.5–5.3)
Sodium: 130 mEq/L — ABNORMAL LOW (ref 135–145)

## 2014-03-02 ENCOUNTER — Telehealth: Payer: Self-pay | Admitting: Family Medicine

## 2014-03-02 NOTE — Telephone Encounter (Signed)
Relevant patient education assigned to patient using Emmi. ° °

## 2014-03-13 ENCOUNTER — Encounter: Payer: Self-pay | Admitting: Family Medicine

## 2014-03-13 ENCOUNTER — Ambulatory Visit (INDEPENDENT_AMBULATORY_CARE_PROVIDER_SITE_OTHER): Payer: BC Managed Care – PPO | Admitting: Family Medicine

## 2014-03-13 VITALS — BP 142/96 | HR 81 | Temp 98.3°F | Wt 112.2 lb

## 2014-03-13 DIAGNOSIS — I10 Essential (primary) hypertension: Secondary | ICD-10-CM

## 2014-03-13 DIAGNOSIS — Z23 Encounter for immunization: Secondary | ICD-10-CM

## 2014-03-13 NOTE — Progress Notes (Signed)
Pre visit review using our clinic review tool, if applicable. No additional management support is needed unless otherwise documented below in the visit note. 

## 2014-03-13 NOTE — Progress Notes (Signed)
  Subjective:    Patient here for follow-up of elevated blood pressure.  She is exercising and is adherent to a low-salt diet.  Blood pressure is well controlled at home. Cardiac symptoms: none. Patient denies: chest pain, chest pressure/discomfort, claudication, dyspnea, exertional chest pressure/discomfort, fatigue, irregular heart beat, lower extremity edema, near-syncope, orthopnea, palpitations, paroxysmal nocturnal dyspnea, syncope and tachypnea. Cardiovascular risk factors: hypertension. Use of agents associated with hypertension: none. History of target organ damage: none.  The following portions of the patient's history were reviewed and updated as appropriate: allergies, current medications, past family history, past medical history, past social history, past surgical history and problem list.  Review of Systems Pertinent items are noted in HPI.     Objective:    BP 142/96  Pulse 81  Temp(Src) 98.3 F (36.8 C) (Oral)  Wt 112 lb 3.4 oz (50.9 kg)  SpO2 99% General appearance: alert, cooperative, appears stated age and no distress Neck: no adenopathy, supple, symmetrical, trachea midline and thyroid not enlarged, symmetric, no tenderness/mass/nodules Lungs: clear to auscultation bilaterally Heart: S1, S2 normal Extremities: extremities normal, atraumatic, no cyanosis or edema    Assessment:    Hypertension, normal blood pressure slightly high. Evidence of target organ damage: none.    Plan:    Medication: no change. Dietary sodium restriction. Regular aerobic exercise. Follow up: 3 months and as needed.

## 2014-03-13 NOTE — Addendum Note (Signed)
Addended by: Ewing Schlein on: 03/13/2014 08:46 AM   Modules accepted: Orders

## 2014-03-13 NOTE — Patient Instructions (Signed)

## 2014-03-17 ENCOUNTER — Other Ambulatory Visit: Payer: Self-pay

## 2014-03-17 DIAGNOSIS — I10 Essential (primary) hypertension: Secondary | ICD-10-CM

## 2014-03-20 ENCOUNTER — Other Ambulatory Visit: Payer: Self-pay

## 2014-03-20 DIAGNOSIS — I10 Essential (primary) hypertension: Secondary | ICD-10-CM

## 2014-03-20 MED ORDER — LISINOPRIL 40 MG PO TABS: 40.0000 mg | ORAL_TABLET | Freq: Every day | ORAL | Status: DC

## 2014-04-09 DIAGNOSIS — C801 Malignant (primary) neoplasm, unspecified: Secondary | ICD-10-CM

## 2014-04-09 HISTORY — DX: Malignant (primary) neoplasm, unspecified: C80.1

## 2014-05-13 ENCOUNTER — Encounter: Payer: Self-pay | Admitting: Family Medicine

## 2014-05-21 ENCOUNTER — Encounter: Payer: BC Managed Care – PPO | Admitting: Family Medicine

## 2014-05-29 ENCOUNTER — Other Ambulatory Visit (HOSPITAL_COMMUNITY)
Admission: RE | Admit: 2014-05-29 | Discharge: 2014-05-29 | Disposition: A | Discharge status: Home / Self Care | Payer: BC Managed Care – PPO | Source: Ambulatory Visit | Attending: Family Medicine | Admitting: Family Medicine

## 2014-05-29 ENCOUNTER — Encounter: Payer: Self-pay | Admitting: Family Medicine

## 2014-05-29 ENCOUNTER — Ambulatory Visit (INDEPENDENT_AMBULATORY_CARE_PROVIDER_SITE_OTHER): Payer: BC Managed Care – PPO | Admitting: Family Medicine

## 2014-05-29 VITALS — BP 170/106 | HR 74 | Temp 98.0°F | Ht 61.0 in | Wt 112.8 lb

## 2014-05-29 DIAGNOSIS — Z1151 Encounter for screening for human papillomavirus (HPV): Secondary | ICD-10-CM | POA: Insufficient documentation

## 2014-05-29 DIAGNOSIS — Z111 Encounter for screening for respiratory tuberculosis: Secondary | ICD-10-CM

## 2014-05-29 DIAGNOSIS — R319 Hematuria, unspecified: Secondary | ICD-10-CM

## 2014-05-29 DIAGNOSIS — Z01419 Encounter for gynecological examination (general) (routine) without abnormal findings: Secondary | ICD-10-CM | POA: Insufficient documentation

## 2014-05-29 DIAGNOSIS — Z0000 Encounter for general adult medical examination without abnormal findings: Secondary | ICD-10-CM

## 2014-05-29 DIAGNOSIS — Z124 Encounter for screening for malignant neoplasm of cervix: Secondary | ICD-10-CM

## 2014-05-29 DIAGNOSIS — Z23 Encounter for immunization: Secondary | ICD-10-CM

## 2014-05-29 DIAGNOSIS — I10 Essential (primary) hypertension: Secondary | ICD-10-CM

## 2014-05-29 DIAGNOSIS — R8290 Unspecified abnormal findings in urine: Secondary | ICD-10-CM

## 2014-05-29 LAB — BASIC METABOLIC PANEL
BUN: 10 mg/dL (ref 6–23)
CALCIUM: 10 mg/dL (ref 8.4–10.5)
CO2: 26 mEq/L (ref 19–32)
Chloride: 92 mEq/L — ABNORMAL LOW (ref 96–112)
Creatinine, Ser: 0.8 mg/dL (ref 0.4–1.2)
GFR: 83.25 mL/min (ref >60.00)
GLUCOSE: 84 mg/dL (ref 70–99)
Potassium: 4.3 mEq/L (ref 3.5–5.1)
SODIUM: 126 meq/L — AB (ref 135–145)

## 2014-05-29 LAB — CBC WITH DIFFERENTIAL/PLATELET
BASOS ABS: 0 10*3/uL (ref 0.0–0.1)
Basophils Relative: 0.6 % (ref 0.0–3.0)
Eosinophils Absolute: 0.1 10*3/uL (ref 0.0–0.7)
Eosinophils Relative: 1 % (ref 0.0–5.0)
HCT: 41.7 % (ref 36.0–46.0)
HEMOGLOBIN: 14.1 g/dL (ref 12.0–15.0)
LYMPHS PCT: 18 % (ref 12.0–46.0)
Lymphs Abs: 1.3 10*3/uL (ref 0.7–4.0)
MCHC: 33.8 g/dL (ref 30.0–36.0)
MCV: 103.5 fl — ABNORMAL HIGH (ref 78.0–100.0)
MONOS PCT: 9.7 % (ref 3.0–12.0)
Monocytes Absolute: 0.7 10*3/uL (ref 0.1–1.0)
NEUTROS ABS: 5 10*3/uL (ref 1.4–7.7)
NEUTROS PCT: 70.7 % (ref 43.0–77.0)
Platelets: 290 10*3/uL (ref 150.0–400.0)
RBC: 4.03 Mil/uL (ref 3.87–5.11)
RDW: 12.3 % (ref 11.5–15.5)
WBC: 7.1 10*3/uL (ref 4.0–10.5)

## 2014-05-29 LAB — MICROALBUMIN / CREATININE URINE RATIO
CREATININE, U: 17.5 mg/dL
MICROALB UR: 1 mg/dL (ref 0.0–1.9)
Microalb Creat Ratio: 5.7 mg/g (ref 0.0–30.0)

## 2014-05-29 LAB — HEPATIC FUNCTION PANEL
ALK PHOS: 61 U/L (ref 39–117)
ALT: 18 U/L (ref 0–35)
AST: 28 U/L (ref 0–37)
Albumin: 4.8 g/dL (ref 3.5–5.2)
BILIRUBIN DIRECT: 0 mg/dL (ref 0.0–0.3)
BILIRUBIN TOTAL: 1 mg/dL (ref 0.2–1.2)
Total Protein: 7.6 g/dL (ref 6.0–8.3)

## 2014-05-29 LAB — POCT URINALYSIS DIPSTICK
Bilirubin, UA: NEGATIVE
Glucose, UA: NEGATIVE
Ketones, UA: NEGATIVE
NITRITE UA: NEGATIVE
PH UA: 6
PROTEIN UA: NEGATIVE
Spec Grav, UA: 1.015
Urobilinogen, UA: 0.2

## 2014-05-29 LAB — LIPID PANEL
CHOL/HDL RATIO: 3
Cholesterol: 204 mg/dL — ABNORMAL HIGH (ref 0–200)
HDL: 77.8 mg/dL (ref >39.00)
LDL CALC: 106 mg/dL — AB (ref 0–99)
NONHDL: 126.2
Triglycerides: 101 mg/dL (ref 0.0–149.0)
VLDL: 20.2 mg/dL (ref 0.0–40.0)

## 2014-05-29 LAB — TSH: TSH: 0.98 u[IU]/mL (ref 0.35–4.50)

## 2014-05-29 MED ORDER — METOPROLOL SUCCINATE ER 25 MG PO TB24: 25.0000 mg | ORAL_TABLET | Freq: Every day | ORAL | Status: DC

## 2014-05-29 NOTE — Addendum Note (Signed)
Addended by: Harl Bowie on: 05/29/2014 04:08 PM   Modules accepted: Orders

## 2014-05-29 NOTE — Progress Notes (Signed)
Pre visit review using our clinic review tool, if applicable. No additional management support is needed unless otherwise documented below in the visit note. 

## 2014-05-29 NOTE — Progress Notes (Signed)
Subjective:     [name] is a 58 y.o. female and is here for a comprehensive physical exam. The patient reports no problems.  History   Social History  . Marital Status: Divorced    Spouse Name: N/A    Number of Children: N/A  . Years of Education: N/A   Occupational History  . [employer]    Social History Main Topics  . Smoking status: Never Smoker   . Smokeless tobacco: Never Used  . Alcohol Use: No     Comment: rare-- 2 x a month  . Drug Use: No  . Sexual Activity:    Partners: Male   Other Topics Concern  . Not on file   Social History Narrative   Exercise--- gym 6x a week   Health Maintenance  Topic Date Due  . MAMMOGRAM  08/21/2014  . INFLUENZA VACCINE  02/08/2015  . PAP SMEAR  03/28/2016  . COLONOSCOPY  11/03/2017  . TETANUS/TDAP  05/29/2024    The following portions of the patient's history were reviewed and updated as appropriate:  She  has a past medical history of Hypertension; Anxiety; Depression; Substance abuse; and Cancer (04/2014). She  does not have any pertinent problems on file. She  has past surgical history that includes Nose surgery. Her family history includes Alzheimer's disease in her mother; Hyperlipidemia in her father; Hypertension in her mother; Kidney disease in her mother. She  reports that she has never smoked. She has never used smokeless tobacco. She reports that she does not drink alcohol or use illicit drugs. She has a current medication list which includes the following prescription(s): lisinopril and metoprolol succinate. Current Outpatient Prescriptions on File Prior to Visit  Medication Sig Dispense Refill  . lisinopril (PRINIVIL,ZESTRIL) 40 MG tablet Take 1 tablet (40 mg total) by mouth daily. 90 tablet 3   No current facility-administered medications on file prior to visit.   She is allergic to codeine..  Review of Systems Review of Systems  Constitutional: Negative for activity change, appetite change and  fatigue.  HENT: Negative for hearing loss, congestion, tinnitus and ear discharge.  dentist q1m Eyes: Negative for visual disturbance (see optho q1y -- vision corrected to 20/20 with glasses).  Respiratory: Negative for cough, chest tightness and shortness of breath.   Cardiovascular: Negative for chest pain, palpitations and leg swelling.  Gastrointestinal: Negative for abdominal pain, diarrhea, constipation and abdominal distention.  Genitourinary: Negative for urgency, frequency, decreased urine volume and difficulty urinating.  Musculoskeletal: Negative for back pain, arthralgias and gait problem.  Skin: Negative for color change, pallor and rash.  Neurological: Negative for dizziness, light-headedness, numbness and headaches.  Hematological: Negative for adenopathy. Does not bruise/bleed easily.  Psychiatric/Behavioral: Negative for suicidal ideas, confusion, sleep disturbance, self-injury, dysphoric mood, decreased concentration and agitation.       Objective:    BP 170/106 mmHg  Pulse 74  Temp(Src) 98 F (36.7 C) (Oral)  Ht 5\' 1"  (1.549 m)  Wt 112 lb 12.8 oz (51.166 kg)  BMI 21.32 kg/m2  SpO2 100% General appearance: alert, cooperative, appears stated age and no distress Head: Normocephalic, without obvious abnormality, atraumatic Eyes: conjunctivae/corneas clear. PERRL, EOM's intact. Fundi benign. Ears: normal TM's and external ear canals both ears Nose: Nares normal. Septum midline. Mucosa normal. No drainage or sinus tenderness. Throat: lips, mucosa, and tongue normal; teeth and gums normal Neck: no adenopathy, no carotid bruit, no JVD, supple, symmetrical, trachea midline and thyroid not enlarged, symmetric, no tenderness/mass/nodules Back:  symmetric, no curvature. ROM normal. No CVA tenderness. Lungs: clear to auscultation bilaterally Breasts: normal appearance, no masses or tenderness Heart: S1, S2 normal Abdomen: soft, non-tender; bowel sounds normal; no masses,   no organomegaly Pelvic: cervix normal in appearance, external genitalia normal, no adnexal masses or tenderness, no cervical motion tenderness, uterus normal size, shape, and consistency, vagina normal without discharge and pap done Extremities: extremities normal, atraumatic, no cyanosis or edema Pulses: 2+ and symmetric Skin: Skin color, texture, turgor normal. No rashes or lesions Lymph nodes: Cervical, supraclavicular, and axillary nodes normal. Neurologic: Alert and oriented X 3, normal strength and tone. Normal symmetric reflexes. Normal coordination and gait Psych-- no depression      Assessment:    Healthy female exam.       Plan:    ghm utd Check labs See After Visit Summary for Counseling Recommendations    1. Need for diphtheria-tetanus-pertussis (Tdap) vaccine, adult/adolescent   - Tdap vaccine greater than or equal to 7yo IM  2. Screening-pulmonary TB   - TB Skin Test  3. Essential hypertension Add toprol  - metoprolol succinate (TOPROL-XL) 25 MG 24 hr tablet; Take 1 tablet (25 mg total) by mouth daily.  Dispense: 90 tablet; Refill: 3 - Basic metabolic panel - CBC with Differential - Hepatic function panel - Lipid panel - POCT urinalysis dipstick - Microalbumin / creatinine urine ratio - TSH  4. Preventative health care  - Basic metabolic panel - CBC with Differential - Hepatic function panel - Lipid panel - POCT urinalysis dipstick - Microalbumin / creatinine urine ratio - TSH - Cytology - PAP  5. Screening for malignant neoplasm of cervix   - Cytology - PAP

## 2014-05-29 NOTE — Patient Instructions (Signed)
Preventive Care for Adults A healthy lifestyle and preventive care can promote health and wellness. Preventive health guidelines for women include the following key practices.  A routine yearly physical is a good way to check with your health care provider about your health and preventive screening. It is a chance to share any concerns and updates on your health and to receive a thorough exam.  Visit your dentist for a routine exam and preventive care every 6 months. Brush your teeth twice a day and floss once a day. Good oral hygiene prevents tooth decay and gum disease.  The frequency of eye exams is based on your age, health, family medical history, use of contact lenses, and other factors. Follow your health care provider's recommendations for frequency of eye exams.  Eat a healthy diet. Foods like vegetables, fruits, whole grains, low-fat dairy products, and lean protein foods contain the nutrients you need without too many calories. Decrease your intake of foods high in solid fats, added sugars, and salt. Eat the right amount of calories for you.Get information about a proper diet from your health care provider, if necessary.  Regular physical exercise is one of the most important things you can do for your health. Most adults should get at least 150 minutes of moderate-intensity exercise (any activity that increases your heart rate and causes you to sweat) each week. In addition, most adults need muscle-strengthening exercises on 2 or more days a week.  Maintain a healthy weight. The body mass index (BMI) is a screening tool to identify possible weight problems. It provides an estimate of body fat based on height and weight. Your health care provider can find your BMI and can help you achieve or maintain a healthy weight.For adults 20 years and older:  A BMI below 18.5 is considered underweight.  A BMI of 18.5 to 24.9 is normal.  A BMI of 25 to 29.9 is considered overweight.  A BMI of  30 and above is considered obese.  Maintain normal blood lipids and cholesterol levels by exercising and minimizing your intake of saturated fat. Eat a balanced diet with plenty of fruit and vegetables. Blood tests for lipids and cholesterol should begin at age 76 and be repeated every 5 years. If your lipid or cholesterol levels are high, you are over 50, or you are at high risk for heart disease, you may need your cholesterol levels checked more frequently.Ongoing high lipid and cholesterol levels should be treated with medicines if diet and exercise are not working.  If you smoke, find out from your health care provider how to quit. If you do not use tobacco, do not start.  Lung cancer screening is recommended for adults aged 22-80 years who are at high risk for developing lung cancer because of a history of smoking. A yearly low-dose CT scan of the lungs is recommended for people who have at least a 30-pack-year history of smoking and are a current smoker or have quit within the past 15 years. A pack year of smoking is smoking an average of 1 pack of cigarettes a day for 1 year (for example: 1 pack a day for 30 years or 2 packs a day for 15 years). Yearly screening should continue until the smoker has stopped smoking for at least 15 years. Yearly screening should be stopped for people who develop a health problem that would prevent them from having lung cancer treatment.  If you are pregnant, do not drink alcohol. If you are breastfeeding,  be very cautious about drinking alcohol. If you are not pregnant and choose to drink alcohol, do not have more than 1 drink per day. One drink is considered to be 12 ounces (355 mL) of beer, 5 ounces (148 mL) of wine, or 1.5 ounces (44 mL) of liquor.  Avoid use of street drugs. Do not share needles with anyone. Ask for help if you need support or instructions about stopping the use of drugs.  High blood pressure causes heart disease and increases the risk of  stroke. Your blood pressure should be checked at least every 1 to 2 years. Ongoing high blood pressure should be treated with medicines if weight loss and exercise do not work.  If you are 75-52 years old, ask your health care provider if you should take aspirin to prevent strokes.  Diabetes screening involves taking a blood sample to check your fasting blood sugar level. This should be done once every 3 years, after age 15, if you are within normal weight and without risk factors for diabetes. Testing should be considered at a younger age or be carried out more frequently if you are overweight and have at least 1 risk factor for diabetes.  Breast cancer screening is essential preventive care for women. You should practice "breast self-awareness." This means understanding the normal appearance and feel of your breasts and may include breast self-examination. Any changes detected, no matter how small, should be reported to a health care provider. Women in their 58s and 30s should have a clinical breast exam (CBE) by a health care provider as part of a regular health exam every 1 to 3 years. After age 16, women should have a CBE every year. Starting at age 53, women should consider having a mammogram (breast X-ray test) every year. Women who have a family history of breast cancer should talk to their health care provider about genetic screening. Women at a high risk of breast cancer should talk to their health care providers about having an MRI and a mammogram every year.  Breast cancer gene (BRCA)-related cancer risk assessment is recommended for women who have family members with BRCA-related cancers. BRCA-related cancers include breast, ovarian, tubal, and peritoneal cancers. Having family members with these cancers may be associated with an increased risk for harmful changes (mutations) in the breast cancer genes BRCA1 and BRCA2. Results of the assessment will determine the need for genetic counseling and  BRCA1 and BRCA2 testing.  Routine pelvic exams to screen for cancer are no longer recommended for nonpregnant women who are considered low risk for cancer of the pelvic organs (ovaries, uterus, and vagina) and who do not have symptoms. Ask your health care provider if a screening pelvic exam is right for you.  If you have had past treatment for cervical cancer or a condition that could lead to cancer, you need Pap tests and screening for cancer for at least 20 years after your treatment. If Pap tests have been discontinued, your risk factors (such as having a new sexual partner) need to be reassessed to determine if screening should be resumed. Some women have medical problems that increase the chance of getting cervical cancer. In these cases, your health care provider may recommend more frequent screening and Pap tests.  The HPV test is an additional test that may be used for cervical cancer screening. The HPV test looks for the virus that can cause the cell changes on the cervix. The cells collected during the Pap test can be  tested for HPV. The HPV test could be used to screen women aged 30 years and older, and should be used in women of any age who have unclear Pap test results. After the age of 30, women should have HPV testing at the same frequency as a Pap test.  Colorectal cancer can be detected and often prevented. Most routine colorectal cancer screening begins at the age of 50 years and continues through age 75 years. However, your health care provider may recommend screening at an earlier age if you have risk factors for colon cancer. On a yearly basis, your health care provider may provide home test kits to check for hidden blood in the stool. Use of a small camera at the end of a tube, to directly examine the colon (sigmoidoscopy or colonoscopy), can detect the earliest forms of colorectal cancer. Talk to your health care provider about this at age 50, when routine screening begins. Direct  exam of the colon should be repeated every 5-10 years through age 75 years, unless early forms of pre-cancerous polyps or small growths are found.  People who are at an increased risk for hepatitis B should be screened for this virus. You are considered at high risk for hepatitis B if:  You were born in a country where hepatitis B occurs often. Talk with your health care provider about which countries are considered high risk.  Your parents were born in a high-risk country and you have not received a shot to protect against hepatitis B (hepatitis B vaccine).  You have HIV or AIDS.  You use needles to inject street drugs.  You live with, or have sex with, someone who has hepatitis B.  You get hemodialysis treatment.  You take certain medicines for conditions like cancer, organ transplantation, and autoimmune conditions.  Hepatitis C blood testing is recommended for all people born from 1945 through 1965 and any individual with known risks for hepatitis C.  Practice safe sex. Use condoms and avoid high-risk sexual practices to reduce the spread of sexually transmitted infections (STIs). STIs include gonorrhea, chlamydia, syphilis, trichomonas, herpes, HPV, and human immunodeficiency virus (HIV). Herpes, HIV, and HPV are viral illnesses that have no cure. They can result in disability, cancer, and death.  You should be screened for sexually transmitted illnesses (STIs) including gonorrhea and chlamydia if:  You are sexually active and are younger than 24 years.  You are older than 24 years and your health care provider tells you that you are at risk for this type of infection.  Your sexual activity has changed since you were last screened and you are at an increased risk for chlamydia or gonorrhea. Ask your health care provider if you are at risk.  If you are at risk of being infected with HIV, it is recommended that you take a prescription medicine daily to prevent HIV infection. This is  called preexposure prophylaxis (PrEP). You are considered at risk if:  You are a heterosexual woman, are sexually active, and are at increased risk for HIV infection.  You take drugs by injection.  You are sexually active with a partner who has HIV.  Talk with your health care provider about whether you are at high risk of being infected with HIV. If you choose to begin PrEP, you should first be tested for HIV. You should then be tested every 3 months for as long as you are taking PrEP.  Osteoporosis is a disease in which the bones lose minerals and strength   with aging. This can result in serious bone fractures or breaks. The risk of osteoporosis can be identified using a bone density scan. Women ages 65 years and over and women at risk for fractures or osteoporosis should discuss screening with their health care providers. Ask your health care provider whether you should take a calcium supplement or vitamin D to reduce the rate of osteoporosis.  Menopause can be associated with physical symptoms and risks. Hormone replacement therapy is available to decrease symptoms and risks. You should talk to your health care provider about whether hormone replacement therapy is right for you.  Use sunscreen. Apply sunscreen liberally and repeatedly throughout the day. You should seek shade when your shadow is shorter than you. Protect yourself by wearing long sleeves, pants, a wide-brimmed hat, and sunglasses year round, whenever you are outdoors.  Once a month, do a whole body skin exam, using a mirror to look at the skin on your back. Tell your health care provider of new moles, moles that have irregular borders, moles that are larger than a pencil eraser, or moles that have changed in shape or color.  Stay current with required vaccines (immunizations).  Influenza vaccine. All adults should be immunized every year.  Tetanus, diphtheria, and acellular pertussis (Td, Tdap) vaccine. Pregnant women should  receive 1 dose of Tdap vaccine during each pregnancy. The dose should be obtained regardless of the length of time since the last dose. Immunization is preferred during the 27th-36th week of gestation. An adult who has not previously received Tdap or who does not know her vaccine status should receive 1 dose of Tdap. This initial dose should be followed by tetanus and diphtheria toxoids (Td) booster doses every 10 years. Adults with an unknown or incomplete history of completing a 3-dose immunization series with Td-containing vaccines should begin or complete a primary immunization series including a Tdap dose. Adults should receive a Td booster every 10 years.  Varicella vaccine. An adult without evidence of immunity to varicella should receive 2 doses or a second dose if she has previously received 1 dose. Pregnant females who do not have evidence of immunity should receive the first dose after pregnancy. This first dose should be obtained before leaving the health care facility. The second dose should be obtained 4-8 weeks after the first dose.  Human papillomavirus (HPV) vaccine. Females aged 13-26 years who have not received the vaccine previously should obtain the 3-dose series. The vaccine is not recommended for use in pregnant females. However, pregnancy testing is not needed before receiving a dose. If a female is found to be pregnant after receiving a dose, no treatment is needed. In that case, the remaining doses should be delayed until after the pregnancy. Immunization is recommended for any person with an immunocompromised condition through the age of 26 years if she did not get any or all doses earlier. During the 3-dose series, the second dose should be obtained 4-8 weeks after the first dose. The third dose should be obtained 24 weeks after the first dose and 16 weeks after the second dose.  Zoster vaccine. One dose is recommended for adults aged 60 years or older unless certain conditions are  present.  Measles, mumps, and rubella (MMR) vaccine. Adults born before 1957 generally are considered immune to measles and mumps. Adults born in 1957 or later should have 1 or more doses of MMR vaccine unless there is a contraindication to the vaccine or there is laboratory evidence of immunity to   each of the three diseases. A routine second dose of MMR vaccine should be obtained at least 28 days after the first dose for students attending postsecondary schools, health care workers, or international travelers. People who received inactivated measles vaccine or an unknown type of measles vaccine during 1963-1967 should receive 2 doses of MMR vaccine. People who received inactivated mumps vaccine or an unknown type of mumps vaccine before 1979 and are at high risk for mumps infection should consider immunization with 2 doses of MMR vaccine. For females of childbearing age, rubella immunity should be determined. If there is no evidence of immunity, females who are not pregnant should be vaccinated. If there is no evidence of immunity, females who are pregnant should delay immunization until after pregnancy. Unvaccinated health care workers born before 1957 who lack laboratory evidence of measles, mumps, or rubella immunity or laboratory confirmation of disease should consider measles and mumps immunization with 2 doses of MMR vaccine or rubella immunization with 1 dose of MMR vaccine.  Pneumococcal 13-valent conjugate (PCV13) vaccine. When indicated, a person who is uncertain of her immunization history and has no record of immunization should receive the PCV13 vaccine. An adult aged 19 years or older who has certain medical conditions and has not been previously immunized should receive 1 dose of PCV13 vaccine. This PCV13 should be followed with a dose of pneumococcal polysaccharide (PPSV23) vaccine. The PPSV23 vaccine dose should be obtained at least 8 weeks after the dose of PCV13 vaccine. An adult aged 19  years or older who has certain medical conditions and previously received 1 or more doses of PPSV23 vaccine should receive 1 dose of PCV13. The PCV13 vaccine dose should be obtained 1 or more years after the last PPSV23 vaccine dose.  Pneumococcal polysaccharide (PPSV23) vaccine. When PCV13 is also indicated, PCV13 should be obtained first. All adults aged 65 years and older should be immunized. An adult younger than age 65 years who has certain medical conditions should be immunized. Any person who resides in a nursing home or long-term care facility should be immunized. An adult smoker should be immunized. People with an immunocompromised condition and certain other conditions should receive both PCV13 and PPSV23 vaccines. People with human immunodeficiency virus (HIV) infection should be immunized as soon as possible after diagnosis. Immunization during chemotherapy or radiation therapy should be avoided. Routine use of PPSV23 vaccine is not recommended for American Indians, [place] Natives, or people younger than 65 years unless there are medical conditions that require PPSV23 vaccine. When indicated, people who have unknown immunization and have no record of immunization should receive PPSV23 vaccine. One-time revaccination 5 years after the first dose of PPSV23 is recommended for people aged 19-64 years who have chronic kidney failure, nephrotic syndrome, asplenia, or immunocompromised conditions. People who received 1-2 doses of PPSV23 before age 65 years should receive another dose of PPSV23 vaccine at age 65 years or later if at least 5 years have passed since the previous dose. Doses of PPSV23 are not needed for people immunized with PPSV23 at or after age 65 years.  Meningococcal vaccine. Adults with asplenia or persistent complement component deficiencies should receive 2 doses of quadrivalent meningococcal conjugate (MenACWY-D) vaccine. The doses should be obtained at least 2 months apart.  Microbiologists working with certain meningococcal bacteria, military recruits, people at risk during an outbreak, and people who travel to or live in countries with a high rate of meningitis should be immunized. A first-year college student up through age   21 years who is living in a residence hall should receive a dose if she did not receive a dose on or after her 16th birthday. Adults who have certain high-risk conditions should receive one or more doses of vaccine.  Hepatitis A vaccine. Adults who wish to be protected from this disease, have certain high-risk conditions, work with hepatitis A-infected animals, work in hepatitis A research labs, or travel to or work in countries with a high rate of hepatitis A should be immunized. Adults who were previously unvaccinated and who anticipate close contact with an international adoptee during the first 60 days after arrival in the [place] from a country with a high rate of hepatitis A should be immunized.  Hepatitis B vaccine. Adults who wish to be protected from this disease, have certain high-risk conditions, may be exposed to blood or other infectious body fluids, are household contacts or sex partners of hepatitis B positive people, are clients or workers in certain care facilities, or travel to or work in countries with a high rate of hepatitis B should be immunized.  Haemophilus influenzae type b (Hib) vaccine. A previously unvaccinated person with asplenia or sickle cell disease or having a scheduled splenectomy should receive 1 dose of Hib vaccine. Regardless of previous immunization, a recipient of a hematopoietic stem cell transplant should receive a 3-dose series 6-12 months after her successful transplant. Hib vaccine is not recommended for adults with HIV infection. Preventive Services / Frequency Ages 64 to 68 years  Blood pressure check.** / Every 1 to 2 years.  Lipid and cholesterol check.** / Every 5 years beginning at age  22.  Clinical breast exam.** / Every 3 years for women in their 88s and 53s.  BRCA-related cancer risk assessment.** / For women who have family members with a BRCA-related cancer (breast, ovarian, tubal, or peritoneal cancers).  Pap test.** / Every 2 years from ages 90 through 51. Every 3 years starting at age 21 through age 56 or 3 with a history of 3 consecutive normal Pap tests.  HPV screening.** / Every 3 years from ages 24 through ages 1 to 46 with a history of 3 consecutive normal Pap tests.  Hepatitis C blood test.** / For any individual with known risks for hepatitis C.  Skin self-exam. / Monthly.  Influenza vaccine. / Every year.  Tetanus, diphtheria, and acellular pertussis (Tdap, Td) vaccine.** / Consult your health care provider. Pregnant women should receive 1 dose of Tdap vaccine during each pregnancy. 1 dose of Td every 10 years.  Varicella vaccine.** / Consult your health care provider. Pregnant females who do not have evidence of immunity should receive the first dose after pregnancy.  HPV vaccine. / 3 doses over 6 months, if 72 and younger. The vaccine is not recommended for use in pregnant females. However, pregnancy testing is not needed before receiving a dose.  Measles, mumps, rubella (MMR) vaccine.** / You need at least 1 dose of MMR if you were born in 1957 or later. You may also need a 2nd dose. For females of childbearing age, rubella immunity should be determined. If there is no evidence of immunity, females who are not pregnant should be vaccinated. If there is no evidence of immunity, females who are pregnant should delay immunization until after pregnancy.  Pneumococcal 13-valent conjugate (PCV13) vaccine.** / Consult your health care provider.  Pneumococcal polysaccharide (PPSV23) vaccine.** / 1 to 2 doses if you smoke cigarettes or if you have certain conditions.  Meningococcal vaccine.** /  1 dose if you are age 19 to 21 years and a first-year college  student living in a residence hall, or have one of several medical conditions, you need to get vaccinated against meningococcal disease. You may also need additional booster doses.  Hepatitis A vaccine.** / Consult your health care provider.  Hepatitis B vaccine.** / Consult your health care provider.  Haemophilus influenzae type b (Hib) vaccine.** / Consult your health care provider. Ages 40 to 64 years  Blood pressure check.** / Every 1 to 2 years.  Lipid and cholesterol check.** / Every 5 years beginning at age 20 years.  Lung cancer screening. / Every year if you are aged 55-80 years and have a 30-pack-year history of smoking and currently smoke or have quit within the past 15 years. Yearly screening is stopped once you have quit smoking for at least 15 years or develop a health problem that would prevent you from having lung cancer treatment.  Clinical breast exam.** / Every year after age 40 years.  BRCA-related cancer risk assessment.** / For women who have family members with a BRCA-related cancer (breast, ovarian, tubal, or peritoneal cancers).  Mammogram.** / Every year beginning at age 40 years and continuing for as long as you are in good health. Consult with your health care provider.  Pap test.** / Every 3 years starting at age 30 years through age 65 or 70 years with a history of 3 consecutive normal Pap tests.  HPV screening.** / Every 3 years from ages 30 years through ages 65 to 70 years with a history of 3 consecutive normal Pap tests.  Fecal occult blood test (FOBT) of stool. / Every year beginning at age 50 years and continuing until age 75 years. You may not need to do this test if you get a colonoscopy every 10 years.  Flexible sigmoidoscopy or colonoscopy.** / Every 5 years for a flexible sigmoidoscopy or every 10 years for a colonoscopy beginning at age 50 years and continuing until age 75 years.  Hepatitis C blood test.** / For all people born from 1945 through  1965 and any individual with known risks for hepatitis C.  Skin self-exam. / Monthly.  Influenza vaccine. / Every year.  Tetanus, diphtheria, and acellular pertussis (Tdap/Td) vaccine.** / Consult your health care provider. Pregnant women should receive 1 dose of Tdap vaccine during each pregnancy. 1 dose of Td every 10 years.  Varicella vaccine.** / Consult your health care provider. Pregnant females who do not have evidence of immunity should receive the first dose after pregnancy.  Zoster vaccine.** / 1 dose for adults aged 60 years or older.  Measles, mumps, rubella (MMR) vaccine.** / You need at least 1 dose of MMR if you were born in 1957 or later. You may also need a 2nd dose. For females of childbearing age, rubella immunity should be determined. If there is no evidence of immunity, females who are not pregnant should be vaccinated. If there is no evidence of immunity, females who are pregnant should delay immunization until after pregnancy.  Pneumococcal 13-valent conjugate (PCV13) vaccine.** / Consult your health care provider.  Pneumococcal polysaccharide (PPSV23) vaccine.** / 1 to 2 doses if you smoke cigarettes or if you have certain conditions.  Meningococcal vaccine.** / Consult your health care provider.  Hepatitis A vaccine.** / Consult your health care provider.  Hepatitis B vaccine.** / Consult your health care provider.  Haemophilus influenzae type b (Hib) vaccine.** / Consult your health care provider. Ages 65   years and over  Blood pressure check.** / Every 1 to 2 years.  Lipid and cholesterol check.** / Every 5 years beginning at age 22 years.  Lung cancer screening. / Every year if you are aged 73-80 years and have a 30-pack-year history of smoking and currently smoke or have quit within the past 15 years. Yearly screening is stopped once you have quit smoking for at least 15 years or develop a health problem that would prevent you from having lung cancer  treatment.  Clinical breast exam.** / Every year after age 4 years.  BRCA-related cancer risk assessment.** / For women who have family members with a BRCA-related cancer (breast, ovarian, tubal, or peritoneal cancers).  Mammogram.** / Every year beginning at age 40 years and continuing for as long as you are in good health. Consult with your health care provider.  Pap test.** / Every 3 years starting at age 9 years through age 34 or 91 years with 3 consecutive normal Pap tests. Testing can be stopped between 65 and 70 years with 3 consecutive normal Pap tests and no abnormal Pap or HPV tests in the past 10 years.  HPV screening.** / Every 3 years from ages 57 years through ages 64 or 45 years with a history of 3 consecutive normal Pap tests. Testing can be stopped between 65 and 70 years with 3 consecutive normal Pap tests and no abnormal Pap or HPV tests in the past 10 years.  Fecal occult blood test (FOBT) of stool. / Every year beginning at age 15 years and continuing until age 17 years. You may not need to do this test if you get a colonoscopy every 10 years.  Flexible sigmoidoscopy or colonoscopy.** / Every 5 years for a flexible sigmoidoscopy or every 10 years for a colonoscopy beginning at age 86 years and continuing until age 71 years.  Hepatitis C blood test.** / For all people born from [birth year] through 1964 and any individual with known risks for hepatitis C.  Osteoporosis screening.** / A one-time screening for women ages 83 years and over and women at risk for fractures or osteoporosis.  Skin self-exam. / Monthly.  Influenza vaccine. / Every year.  Tetanus, diphtheria, and acellular pertussis (Tdap/Td) vaccine.** / 1 dose of Td every 10 years.  Varicella vaccine.** / Consult your health care provider.  Zoster vaccine.** / 1 dose for adults aged 61 years or older.  Pneumococcal 13-valent conjugate (PCV13) vaccine.** / Consult your health care provider.  Pneumococcal  polysaccharide (PPSV23) vaccine.** / 1 dose for all adults aged 28 years and older.  Meningococcal vaccine.** / Consult your health care provider.  Hepatitis A vaccine.** / Consult your health care provider.  Hepatitis B vaccine.** / Consult your health care provider.  Haemophilus influenzae type b (Hib) vaccine.** / Consult your health care provider. ** Family history and personal history of risk and conditions may change your health care provider's recommendations. Document Released: 08/22/2001 Document Revised: 11/10/2013 Document Reviewed: 11/21/2010 [facility] Patient Information 2015 [place], [place]. This information is not intended to replace advice given to you by your health care provider. Make sure you discuss any questions you have with your health care provider.

## 2014-05-31 ENCOUNTER — Other Ambulatory Visit: Payer: Self-pay | Admitting: Family Medicine

## 2014-05-31 DIAGNOSIS — E871 Hypo-osmolality and hyponatremia: Secondary | ICD-10-CM

## 2014-05-31 LAB — URINE CULTURE: Colony Count: >=100000

## 2014-06-01 ENCOUNTER — Other Ambulatory Visit: Payer: Self-pay

## 2014-06-01 DIAGNOSIS — E871 Hypo-osmolality and hyponatremia: Secondary | ICD-10-CM

## 2014-06-01 LAB — TB SKIN TEST
INDURATION: 0 mm
TB Skin Test: NEGATIVE

## 2014-06-02 LAB — CYTOLOGY - PAP

## 2014-06-03 ENCOUNTER — Other Ambulatory Visit: Payer: Self-pay | Admitting: Family Medicine

## 2014-06-03 DIAGNOSIS — E871 Hypo-osmolality and hyponatremia: Secondary | ICD-10-CM

## 2014-06-05 ENCOUNTER — Encounter: Payer: Self-pay | Admitting: Family Medicine

## 2014-06-16 ENCOUNTER — Encounter: Payer: Self-pay | Admitting: Family Medicine

## 2014-06-30 ENCOUNTER — Encounter: Payer: Self-pay | Admitting: Family Medicine

## 2014-06-30 ENCOUNTER — Ambulatory Visit (INDEPENDENT_AMBULATORY_CARE_PROVIDER_SITE_OTHER): Payer: BC Managed Care – PPO | Admitting: Family Medicine

## 2014-06-30 VITALS — BP 164/92 | HR 82 | Temp 99.0°F | Resp 14 | Wt 111.8 lb

## 2014-06-30 DIAGNOSIS — E871 Hypo-osmolality and hyponatremia: Secondary | ICD-10-CM

## 2014-06-30 DIAGNOSIS — I10 Essential (primary) hypertension: Secondary | ICD-10-CM

## 2014-06-30 DIAGNOSIS — Z23 Encounter for immunization: Secondary | ICD-10-CM

## 2014-06-30 LAB — BASIC METABOLIC PANEL
BUN: 9 mg/dL (ref 6–23)
CO2: 25 meq/L (ref 19–32)
CREATININE: 0.8 mg/dL (ref 0.4–1.2)
Calcium: 9.9 mg/dL (ref 8.4–10.5)
Chloride: 94 mEq/L — ABNORMAL LOW (ref 96–112)
GFR: 79.59 mL/min (ref >60.00)
Glucose, Bld: 77 mg/dL (ref 70–99)
Potassium: 5 mEq/L (ref 3.5–5.1)
Sodium: 128 mEq/L — ABNORMAL LOW (ref 135–145)

## 2014-06-30 MED ORDER — METOPROLOL SUCCINATE ER 50 MG PO TB24: 50.0000 mg | ORAL_TABLET | Freq: Every day | ORAL | Status: DC

## 2014-06-30 NOTE — Patient Instructions (Signed)
Hyponatremia  °Hyponatremia is when the amount of salt (sodium) in your blood is too low. When sodium levels are low, your cells will absorb extra water and swell. The swelling happens throughout the body, but it mostly affects the brain. Severe brain swelling (cerebral edema), seizures, or coma can happen.  °CAUSES  °· Heart, kidney, or liver problems. °· Thyroid problems. °· Adrenal gland problems. °· Severe vomiting and diarrhea. °· Certain medicines or illegal drugs. °· Dehydration. °· Drinking too much water. °· Low-sodium diet. °SYMPTOMS  °· Nausea and vomiting. °· Confusion. °· Lethargy. °· Agitation. °· Headache. °· Twitching or shaking (seizures). °· Unconsciousness. °· Appetite loss. °· Muscle weakness and cramping. °DIAGNOSIS  °Hyponatremia is identified by a simple blood test. Your caregiver will perform a history and physical exam to try to find the cause and type of hyponatremia. Other tests may be needed to measure the amount of sodium in your blood and urine. °TREATMENT  °Treatment will depend on the cause.  °· Fluids may be given through the vein (IV). °· Medicines may be used to correct the sodium imbalance. If medicines are causing the problem, they will need to be adjusted. °· Water or fluid intake may be restricted to restore proper balance. °The speed of correcting the sodium problem is very important. If the problem is corrected too fast, nerve damage (sometimes unchangeable) can happen. °HOME CARE INSTRUCTIONS  °· Only take medicines as directed by your caregiver. Many medicines can make hyponatremia worse. Discuss all your medicines with your caregiver. °· Carefully follow any recommended diet, including any fluid restrictions. °· You may be asked to repeat lab tests. Follow these directions. °· Avoid alcohol and recreational drugs. °SEEK MEDICAL CARE IF:  °· You develop worsening nausea, fatigue, headache, confusion, or weakness. °· Your original hyponatremia symptoms return. °· You have  problems following the recommended diet. °SEEK IMMEDIATE MEDICAL CARE IF:  °· You have a seizure. °· You faint. °· You have ongoing diarrhea or vomiting. °MAKE SURE YOU:  °· Understand these instructions. °· Will watch your condition. °· Will get help right away if you are not doing well or get worse. °Document Released: 06/16/2002 Document Revised: 09/18/2011 Document Reviewed: 12/11/2010 °[employer]® Patient Information ©2015 [employer]. This information is not intended to replace advice given to you by your health care provider. Make sure you discuss any questions you have with your health care provider. ° °

## 2014-06-30 NOTE — Progress Notes (Signed)
  Subjective:    Patient here for follow-up of elevated blood pressure.  She is not exercising and is not adherent to a low-salt diet because sodium is low.   Blood pressure is not well controlled at home. Cardiac symptoms: none. Patient denies: chest pain, chest pressure/discomfort, claudication, dyspnea, exertional chest pressure/discomfort, fatigue, irregular heart beat, lower extremity edema, near-syncope, orthopnea, palpitations, paroxysmal nocturnal dyspnea, syncope and tachypnea. Cardiovascular risk factors: hypertension and sedentary lifestyle. Use of agents associated with hypertension: none. History of target organ damage: none.  The following portions of the patient's history were reviewed and updated as appropriate: allergies, current medications, past family history, past medical history, past social history, past surgical history and problem list.  Review of Systems Pertinent items are noted in HPI.     Objective:    BP 164/92 mmHg  Pulse 82  Temp(Src) 99 F (37.2 C) (Oral)  Resp 14  Wt 111 lb 12.8 oz (50.712 kg)  SpO2 99% General appearance: alert, cooperative, appears stated age and no distress Neck: no adenopathy, supple, symmetrical, trachea midline and thyroid not enlarged, symmetric, no tenderness/mass/nodules Lungs: clear to auscultation bilaterally Heart: S1, S2 normal Extremities: extremities normal, atraumatic, no cyanosis or edema    Assessment:    Hypertension, elevated . Evidence of target organ damage: none.    Plan:    Medication: increase to toprol 50 mg. Check blood pressures 2-3 times weekly and record. Follow up: 3 months and as needed.    1. Hyponatremia Recheck today - Basic metabolic panel  2. Essential hypertension  - metoprolol succinate (TOPROL-XL) 50 MG 24 hr tablet; Take 1 tablet (50 mg total) by mouth daily. Take with or immediately following a meal.  Dispense: 90 tablet; Refill: 3  3. Immunization due  - Hepatitis A hepatitis B  combined vaccine IM

## 2014-06-30 NOTE — Progress Notes (Signed)
Pre visit review using our clinic review tool, if applicable. No additional management support is needed unless otherwise documented below in the visit note. 

## 2014-07-02 LAB — OSMOLALITY: OSMOLALITY: 268 mosm/kg — AB (ref 275–300)

## 2014-07-02 NOTE — Addendum Note (Signed)
Addended by: Modena Morrow D on: 07/02/2014 01:45 PM   Modules accepted: Orders

## 2014-07-10 DIAGNOSIS — G459 Transient cerebral ischemic attack, unspecified: Secondary | ICD-10-CM

## 2014-07-10 HISTORY — DX: Transient cerebral ischemic attack, unspecified: G45.9

## 2014-07-13 ENCOUNTER — Telehealth: Payer: Self-pay

## 2014-07-13 DIAGNOSIS — E871 Hypo-osmolality and hyponatremia: Secondary | ICD-10-CM

## 2014-07-13 NOTE — Telephone Encounter (Signed)
Spoke with the patient and the increase of the Metoprolol has done the trick, she said her BP is doing great. She said the bottom number is under 80 now. She said she will call back and schedule the lab apt when she knows what her schedule is.      KP

## 2014-07-13 NOTE — Telephone Encounter (Signed)
-----   Message from Rosalita Chessman, DO sent at 07/07/2014  7:25 PM EST ----- Sodium had improved slightly---how is bp? i would like to bring her back in in 4-6 weeks to recheck bmp-- hyponatremia---hopefully it will con't to improve

## 2014-07-13 NOTE — Telephone Encounter (Signed)
Great!

## 2014-08-11 ENCOUNTER — Other Ambulatory Visit: Payer: Self-pay

## 2014-08-11 ENCOUNTER — Telehealth: Payer: Self-pay | Admitting: Family Medicine

## 2014-08-11 DIAGNOSIS — E2839 Other primary ovarian failure: Secondary | ICD-10-CM

## 2014-08-11 DIAGNOSIS — R29890 Loss of height: Secondary | ICD-10-CM

## 2014-08-11 DIAGNOSIS — Z1231 Encounter for screening mammogram for malignant neoplasm of breast: Secondary | ICD-10-CM

## 2014-08-11 NOTE — Telephone Encounter (Signed)
Please advise if patient is age appropriate for BMD.      KP

## 2014-08-11 NOTE — Telephone Encounter (Signed)
Caller name: Mirakle, Tomlin Relation to pt: self  Call back number: [phone]   Reason for call:  Pt wanted to inform you she will be having her Mamo done March 3rd at [facility] and requesting a order for Bone Density.

## 2014-08-11 NOTE — Telephone Encounter (Signed)
She is postmenopausal and has hx height loss

## 2014-08-11 NOTE — Telephone Encounter (Signed)
Order has been placed. VM left making the patient aware.       KP

## 2014-08-31 ENCOUNTER — Other Ambulatory Visit: Payer: Self-pay

## 2014-09-01 ENCOUNTER — Other Ambulatory Visit (INDEPENDENT_AMBULATORY_CARE_PROVIDER_SITE_OTHER): Payer: BLUE CROSS/BLUE SHIELD

## 2014-09-01 ENCOUNTER — Ambulatory Visit (INDEPENDENT_AMBULATORY_CARE_PROVIDER_SITE_OTHER): Payer: BLUE CROSS/BLUE SHIELD

## 2014-09-01 DIAGNOSIS — E871 Hypo-osmolality and hyponatremia: Secondary | ICD-10-CM

## 2014-09-01 DIAGNOSIS — Z23 Encounter for immunization: Secondary | ICD-10-CM

## 2014-09-01 NOTE — Progress Notes (Signed)
Pre visit review using our clinic review tool, if applicable. No additional management support is needed unless otherwise documented below in the visit note. 

## 2014-09-02 LAB — BASIC METABOLIC PANEL
BUN: 11 mg/dL (ref 6–23)
CHLORIDE: 99 meq/L (ref 96–112)
CO2: 24 mEq/L (ref 19–32)
Calcium: 9.8 mg/dL (ref 8.4–10.5)
Creatinine, Ser: 0.8 mg/dL (ref 0.40–1.20)
GFR: 78.4 mL/min (ref >60.00)
Glucose, Bld: 76 mg/dL (ref 70–99)
Potassium: 4.3 mEq/L (ref 3.5–5.1)
Sodium: 132 mEq/L — ABNORMAL LOW (ref 135–145)

## 2014-09-03 ENCOUNTER — Encounter: Payer: Self-pay | Admitting: Family Medicine

## 2014-09-10 ENCOUNTER — Ambulatory Visit
Admission: RE | Admit: 2014-09-10 | Discharge: 2014-09-10 | Disposition: A | Discharge status: Home / Self Care | Payer: BLUE CROSS/BLUE SHIELD | Source: Ambulatory Visit

## 2014-09-10 DIAGNOSIS — Z1231 Encounter for screening mammogram for malignant neoplasm of breast: Secondary | ICD-10-CM

## 2014-09-11 ENCOUNTER — Ambulatory Visit
Admission: RE | Admit: 2014-09-11 | Discharge: 2014-09-11 | Disposition: A | Discharge status: Home / Self Care | Payer: BLUE CROSS/BLUE SHIELD | Source: Ambulatory Visit | Attending: Family Medicine | Admitting: Family Medicine

## 2014-09-11 DIAGNOSIS — R29890 Loss of height: Secondary | ICD-10-CM

## 2014-09-11 DIAGNOSIS — E2839 Other primary ovarian failure: Secondary | ICD-10-CM

## 2014-09-17 ENCOUNTER — Other Ambulatory Visit: Payer: Self-pay | Admitting: Family Medicine

## 2014-09-17 MED ORDER — ALENDRONATE SODIUM 70 MG PO TABS: 70.0000 mg | ORAL_TABLET | ORAL | Status: DC

## 2015-02-14 ENCOUNTER — Other Ambulatory Visit: Payer: Self-pay | Admitting: Family Medicine

## 2015-02-15 NOTE — Telephone Encounter (Signed)
90 day supply Lisinopril sent to pharmacy.  Pt is due for fasting office visit with Dr Etter Sjogren now.  Please call pt to arrange fasting f/u before further refills are due. Thanks!

## 2015-03-12 ENCOUNTER — Encounter: Payer: Self-pay | Admitting: Gastroenterology

## 2015-05-12 ENCOUNTER — Telehealth: Payer: Self-pay | Admitting: Family Medicine

## 2015-05-12 NOTE — Telephone Encounter (Signed)
Pt called requesting appt for tb skin test. Scheduled for 05/25/15.

## 2015-05-13 ENCOUNTER — Other Ambulatory Visit: Payer: Self-pay | Admitting: Family Medicine

## 2015-05-13 NOTE — Telephone Encounter (Signed)
Last seen 06/30/14. Please advise     KP

## 2015-05-13 NOTE — Telephone Encounter (Signed)
cpe is scheduled already for March 2017 (Tiff scheduled on 05/11/15)

## 2015-05-13 NOTE — Telephone Encounter (Signed)
Schedule a CPE.     KP

## 2015-05-13 NOTE — Telephone Encounter (Signed)
Ok to do but let pt know she is due for cpe

## 2015-05-25 ENCOUNTER — Ambulatory Visit: Payer: BLUE CROSS/BLUE SHIELD

## 2015-05-26 ENCOUNTER — Ambulatory Visit (INDEPENDENT_AMBULATORY_CARE_PROVIDER_SITE_OTHER): Payer: BLUE CROSS/BLUE SHIELD

## 2015-05-26 DIAGNOSIS — Z111 Encounter for screening for respiratory tuberculosis: Secondary | ICD-10-CM

## 2015-05-28 LAB — TB SKIN TEST
INDURATION: 0 mm
TB SKIN TEST: NEGATIVE

## 2015-06-13 ENCOUNTER — Inpatient Hospital Stay (HOSPITAL_COMMUNITY)
Admission: EM | Admit: 2015-06-13 | Discharge: 2015-06-15 | DRG: 069 | Disposition: A | Discharge status: Home Health | Payer: BLUE CROSS/BLUE SHIELD | Attending: Internal Medicine | Admitting: Internal Medicine

## 2015-06-13 ENCOUNTER — Emergency Department (HOSPITAL_COMMUNITY): Payer: BLUE CROSS/BLUE SHIELD

## 2015-06-13 ENCOUNTER — Inpatient Hospital Stay (HOSPITAL_COMMUNITY): Payer: BLUE CROSS/BLUE SHIELD

## 2015-06-13 ENCOUNTER — Encounter (HOSPITAL_COMMUNITY): Payer: Self-pay | Admitting: *Deleted

## 2015-06-13 DIAGNOSIS — G20 Parkinson's disease: Secondary | ICD-10-CM

## 2015-06-13 DIAGNOSIS — R270 Ataxia, unspecified: Secondary | ICD-10-CM | POA: Diagnosis present

## 2015-06-13 DIAGNOSIS — I10 Essential (primary) hypertension: Secondary | ICD-10-CM | POA: Diagnosis present

## 2015-06-13 DIAGNOSIS — F419 Anxiety disorder, unspecified: Secondary | ICD-10-CM | POA: Diagnosis present

## 2015-06-13 DIAGNOSIS — G8192 Hemiplegia, unspecified affecting left dominant side: Secondary | ICD-10-CM | POA: Diagnosis present

## 2015-06-13 DIAGNOSIS — I639 Cerebral infarction, unspecified: Secondary | ICD-10-CM | POA: Insufficient documentation

## 2015-06-13 DIAGNOSIS — R269 Unspecified abnormalities of gait and mobility: Secondary | ICD-10-CM

## 2015-06-13 DIAGNOSIS — E871 Hypo-osmolality and hyponatremia: Secondary | ICD-10-CM | POA: Diagnosis present

## 2015-06-13 DIAGNOSIS — E785 Hyperlipidemia, unspecified: Secondary | ICD-10-CM | POA: Diagnosis present

## 2015-06-13 DIAGNOSIS — G459 Transient cerebral ischemic attack, unspecified: Secondary | ICD-10-CM | POA: Diagnosis present

## 2015-06-13 DIAGNOSIS — R29898 Other symptoms and signs involving the musculoskeletal system: Secondary | ICD-10-CM

## 2015-06-13 DIAGNOSIS — G319 Degenerative disease of nervous system, unspecified: Secondary | ICD-10-CM | POA: Diagnosis present

## 2015-06-13 DIAGNOSIS — R29810 Facial weakness: Secondary | ICD-10-CM | POA: Diagnosis present

## 2015-06-13 DIAGNOSIS — G458 Other transient cerebral ischemic attacks and related syndromes: Secondary | ICD-10-CM | POA: Diagnosis not present

## 2015-06-13 DIAGNOSIS — F1010 Alcohol abuse, uncomplicated: Secondary | ICD-10-CM | POA: Diagnosis present

## 2015-06-13 DIAGNOSIS — G912 (Idiopathic) normal pressure hydrocephalus: Secondary | ICD-10-CM | POA: Diagnosis not present

## 2015-06-13 LAB — URINALYSIS, ROUTINE W REFLEX MICROSCOPIC
Bilirubin Urine: NEGATIVE
Glucose, UA: NEGATIVE mg/dL
KETONES UR: NEGATIVE mg/dL
Nitrite: NEGATIVE
PH: 7 (ref 5.0–8.0)
PROTEIN: NEGATIVE mg/dL
Specific Gravity, Urine: 1.005 (ref 1.005–1.030)

## 2015-06-13 LAB — CORTISOL: CORTISOL PLASMA: 13.2 ug/dL

## 2015-06-13 LAB — URINE MICROSCOPIC-ADD ON: BACTERIA UA: NONE SEEN

## 2015-06-13 LAB — OSMOLALITY: OSMOLALITY: 269 mosm/kg — AB (ref 275–295)

## 2015-06-13 LAB — DIFFERENTIAL
BASOS PCT: 1 %
Basophils Absolute: 0 10*3/uL (ref 0.0–0.1)
EOS ABS: 0.1 10*3/uL (ref 0.0–0.7)
Eosinophils Relative: 2 %
LYMPHS ABS: 1.4 10*3/uL (ref 0.7–4.0)
Lymphocytes Relative: 19 %
Monocytes Absolute: 0.8 10*3/uL (ref 0.1–1.0)
Monocytes Relative: 11 %
Neutro Abs: 5 10*3/uL (ref 1.7–7.7)
Neutrophils Relative %: 67 %

## 2015-06-13 LAB — CBC
HCT: 39.8 % (ref 36.0–46.0)
Hemoglobin: 13.8 g/dL (ref 12.0–15.0)
MCH: 35.7 pg — ABNORMAL HIGH (ref 26.0–34.0)
MCHC: 34.7 g/dL (ref 30.0–36.0)
MCV: 102.8 fL — ABNORMAL HIGH (ref 78.0–100.0)
Platelets: 267 10*3/uL (ref 150–400)
RBC: 3.87 MIL/uL (ref 3.87–5.11)
RDW: 11.9 % (ref 11.5–15.5)
WBC: 7.4 10*3/uL (ref 4.0–10.5)

## 2015-06-13 LAB — SODIUM, URINE, RANDOM: Sodium, Ur: 71 mmol/L

## 2015-06-13 LAB — COMPREHENSIVE METABOLIC PANEL
ALBUMIN: 4.4 g/dL (ref 3.5–5.0)
ALT: 26 U/L (ref 14–54)
AST: 43 U/L — ABNORMAL HIGH (ref 15–41)
Alkaline Phosphatase: 66 U/L (ref 38–126)
Anion gap: 10 (ref 5–15)
BILIRUBIN TOTAL: 0.8 mg/dL (ref 0.3–1.2)
BUN: 9 mg/dL (ref 6–20)
CHLORIDE: 94 mmol/L — AB (ref 101–111)
CO2: 23 mmol/L (ref 22–32)
Calcium: 9.5 mg/dL (ref 8.9–10.3)
Creatinine, Ser: 0.8 mg/dL (ref 0.44–1.00)
GFR calc Af Amer: >60 mL/min (ref >60)
GFR calc non Af Amer: >60 mL/min (ref >60)
GLUCOSE: 117 mg/dL — AB (ref 65–99)
POTASSIUM: 4 mmol/L (ref 3.5–5.1)
Sodium: 127 mmol/L — ABNORMAL LOW (ref 135–145)
Total Protein: 7.3 g/dL (ref 6.5–8.1)

## 2015-06-13 LAB — RAPID URINE DRUG SCREEN, HOSP PERFORMED
AMPHETAMINES: NOT DETECTED
BARBITURATES: NOT DETECTED
BENZODIAZEPINES: NOT DETECTED
COCAINE: NOT DETECTED
Opiates: NOT DETECTED
TETRAHYDROCANNABINOL: NOT DETECTED

## 2015-06-13 LAB — I-STAT CHEM 8, ED
BUN: 8 mg/dL (ref 6–20)
Calcium, Ion: 1.08 mmol/L — ABNORMAL LOW (ref 1.12–1.23)
Chloride: 94 mmol/L — ABNORMAL LOW (ref 101–111)
Creatinine, Ser: 0.8 mg/dL (ref 0.44–1.00)
Glucose, Bld: 116 mg/dL — ABNORMAL HIGH (ref 65–99)
HEMATOCRIT: 47 % — AB (ref 36.0–46.0)
HEMOGLOBIN: 16 g/dL — AB (ref 12.0–15.0)
Potassium: 3.9 mmol/L (ref 3.5–5.1)
Sodium: 129 mmol/L — ABNORMAL LOW (ref 135–145)
TCO2: 24 mmol/L (ref 0–100)

## 2015-06-13 LAB — I-STAT TROPONIN, ED: Troponin i, poc: 0 ng/mL (ref 0.00–0.08)

## 2015-06-13 LAB — OSMOLALITY, URINE: Osmolality, Ur: 227 mOsm/kg — ABNORMAL LOW (ref 300–900)

## 2015-06-13 LAB — APTT: APTT: 25 s (ref 24–37)

## 2015-06-13 LAB — ETHANOL: Alcohol, Ethyl (B): 49 mg/dL — ABNORMAL HIGH (ref <5)

## 2015-06-13 LAB — TSH: TSH: 2.76 u[IU]/mL (ref 0.350–4.500)

## 2015-06-13 LAB — PROTIME-INR
INR: 0.99 (ref 0.00–1.49)
Prothrombin Time: 13.3 seconds (ref 11.6–15.2)

## 2015-06-13 MED ORDER — SENNOSIDES-DOCUSATE SODIUM 8.6-50 MG PO TABS: 1.0000 | ORAL_TABLET | Freq: Every evening | ORAL | Status: DC | PRN

## 2015-06-13 MED ORDER — SODIUM CHLORIDE 0.9 % IV SOLN
INTRAVENOUS | Status: DC
  Administered 2015-06-13 – 2015-06-14 (×2): 75 mL/h via INTRAVENOUS

## 2015-06-13 MED ORDER — ASPIRIN EC 325 MG PO TBEC
325.0000 mg | DELAYED_RELEASE_TABLET | Freq: Every day | ORAL | Status: DC
  Administered 2015-06-13 – 2015-06-15 (×3): 325 mg via ORAL
  Filled 2015-06-13 (×3): qty 1

## 2015-06-13 MED ORDER — LORAZEPAM 2 MG/ML IJ SOLN
INTRAMUSCULAR | Status: AC
  Filled 2015-06-13: qty 1

## 2015-06-13 MED ORDER — LORAZEPAM 2 MG/ML IJ SOLN
1.0000 mg | Freq: Once | INTRAMUSCULAR | Status: AC
  Administered 2015-06-13: 1 mg via INTRAVENOUS

## 2015-06-13 MED ORDER — ENOXAPARIN SODIUM 40 MG/0.4ML ~~LOC~~ SOLN
40.0000 mg | SUBCUTANEOUS | Status: DC
  Administered 2015-06-13 – 2015-06-14 (×2): 40 mg via SUBCUTANEOUS
  Filled 2015-06-13 (×2): qty 0.4

## 2015-06-13 MED ORDER — HYDRALAZINE HCL 20 MG/ML IJ SOLN
10.0000 mg | Freq: Four times a day (QID) | INTRAMUSCULAR | Status: DC | PRN
  Administered 2015-06-14: 10 mg via INTRAVENOUS
  Filled 2015-06-13: qty 1

## 2015-06-13 MED ORDER — AMANTADINE HCL 100 MG PO CAPS
100.0000 mg | ORAL_CAPSULE | Freq: Two times a day (BID) | ORAL | Status: DC
  Administered 2015-06-14: 100 mg via ORAL
  Filled 2015-06-13 (×4): qty 1

## 2015-06-13 MED ORDER — ZOLPIDEM TARTRATE 5 MG PO TABS
5.0000 mg | ORAL_TABLET | Freq: Once | ORAL | Status: AC
  Administered 2015-06-13: 5 mg via ORAL
  Filled 2015-06-13: qty 1

## 2015-06-13 MED ORDER — STROKE: EARLY STAGES OF RECOVERY BOOK
Freq: Once | Status: AC
  Administered 2015-06-13: 20:00:00

## 2015-06-13 NOTE — ED Notes (Signed)
Pt alert    Swollen lt face droop    Alert oriented

## 2015-06-13 NOTE — ED Notes (Signed)
Pt passed swallow screen . i just came on duty.  When walking intoi the room  The pt had a cup oif water.  i asked if she had been drinking it and who gave it to her.  She reported some man gave her the water to drink.

## 2015-06-13 NOTE — Consult Note (Addendum)
NEURO HOSPITALIST CONSULT NOTE   Requestig physician: Dr.  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 She reported noticing left leg weakness and difficulty with gait this morning and she was doing her home health visits. Process time of this onset is unknown and she had the symptoms after she woke up and that she has been functional. No clear history of any vision or speech problems.  Patient reported having some occasional balance problems for the past several weeks denies any falls. She also reported having some mild memory problems as well but she is active and continues to work as a Chief Strategy Officer. She has urinary urgency, but denied any urinary accidents or incontinence.  Past Medical History  Diagnosis Date  . Hypertension   . Anxiety   . Depression   . Substance abuse   . Cancer ([place]) 04/2014    squamous cell carcinoma L inner thigh    Past Surgical History  Procedure Laterality Date  . Nose surgery      Epistaxis    Family History  Problem Relation Age of Onset  . Hypertension Mother   . Kidney disease Mother     dialysis  . Alzheimer's disease Mother   . Hyperlipidemia Father   . Stroke Paternal Grandmother     Social History:  reports that she has never smoked. She has never used smokeless tobacco. She reports that she does not drink alcohol or use illicit drugs.  Allergies  Allergen Reactions  . Codeine     REACTION: insomnia and anxious    MEDICATIONS:                                                                                                                      Current facility-administered medications:  .  0.9 %  sodium chloride infusion, , Intravenous, Continuous, Melton Alar, PA-C,  Last Rate: 75 mL/hr at 06/13/15 1948, 75 mL/hr at 06/13/15 1948 .  aspirin EC tablet  325 mg, 325 mg, Oral, Daily, Melton Alar, PA-C, 325 mg at 06/13/15 1635 .  enoxaparin (LOVENOX) injection 40 mg, 40 mg, Subcutaneous, Q24H, Marianne L York, PA-C, 40 mg at 06/13/15 1948 .  hydrALAZINE (APRESOLINE) injection 10 mg, 10 mg, Intravenous, Q6H PRN, Melton Alar, PA-C .  LORazepam (ATIVAN) 2 MG/ML injection, , , ,  .  senna-docusate (Senokot-S) tablet 1 tablet, 1 tablet, Oral, QHS PRN, Bobby Rumpf York, PA-C    ROS:                                                                                                                                       History obtained from the patient  General ROS: negative for - chills, fatigue, fever, night sweats, weight gain or weight loss Psychological ROS: negative for - behavioral disorder, hallucinations, memory difficulties, mood swings or suicidal ideation Ophthalmic ROS: negative for - blurry vision, double vision, eye pain or loss of vision ENT ROS: negative for - epistaxis, nasal discharge, oral lesions, sore throat, tinnitus or vertigo Allergy and Immunology ROS: negative for - hives or itchy/watery eyes Hematological and Lymphatic ROS: negative for - bleeding problems, bruising or swollen lymph nodes Endocrine ROS: negative for - galactorrhea, hair pattern changes, polydipsia/polyuria or temperature intolerance Respiratory ROS: negative for - cough, hemoptysis, shortness of breath or wheezing Cardiovascular ROS: negative for - chest pain, dyspnea on exertion, edema or irregular heartbeat Gastrointestinal ROS: negative for - abdominal pain, diarrhea, hematemesis, nausea/vomiting or stool incontinence Genito-Urinary ROS: negative for - dysuria, hematuria, incontinence or urinary frequency/urgency Musculoskeletal ROS: negative for - joint swelling or muscular weakness Neurological ROS: as noted in HPI Dermatological ROS: negative for rash and skin  lesion changes   Blood pressure 159/91, pulse 65, temperature 97.9 F (36.6 C), temperature source Oral, resp. rate 16, SpO2 97 %.   Neurologic Examination:                                                                                                        Neurological Examination Mental Status: Alert, oriented, thought content appropriate.  Speech showed hypophonia, but fluent without evidence of aphasia or dysarthria.  Able to follow 3 step commands without difficulty. Cranial Nerves: II:  Visual fields grossly normal, pupils equal, round, reactive to light and accommodation III,IV, VI: ptosis not present, extra-ocular motions intact bilaterally V,VII: smile symmetric, facial light touch sensation normal bilaterally. Has a masked face. VIII: hearing normal bilaterally IX,X: uvula rises symmetrically  XI: bilateral shoulder shrug XII: midline tongue extension Motor: Right : Upper extremity   5/5    Left:     Upper extremity   5/5  Lower extremity   5/5     Lower extremity   5/5 Moderate rigidity noted in the left upper and lower extremities, minimal rigidity in the right upper and lower extremities.  Sensory: Pinprick and light touch intact throughout, bilaterally Deep Tendon Reflexes: 3+ and symmetric throughout Plantars:Right: downgoing   Left: downgoing Cerebellar: normal finger-to-nose,.  Minimal intermittent resting tremor is seen bilaterally, slightly worse on the right Gait: She has severely reduced ground clearance, wide-based gait, suggestive for magnetic gait which is typically seen with normal pressure hydrocephalus. Moderate gait instability.    Lab Results: Basic Metabolic Panel:  Recent Labs Lab 06/13/15 1433 06/13/15 1438  NA 127* 129*  K 4.0 3.9  CL 94* 94*  CO2 23  --   GLUCOSE 117* 116*  BUN 9 8  CREATININE 0.80 0.80  CALCIUM 9.5  --     Liver Function Tests:  Recent Labs Lab 06/13/15 1433  AST 43*  ALT 26  ALKPHOS 66  BILITOT 0.8  PROT  7.3  ALBUMIN 4.4   No results for input(s): LIPASE, AMYLASE in the last 168 hours. No results for input(s): AMMONIA in the last 168 hours.  CBC:  Recent Labs Lab 06/13/15 1433 06/13/15 1438  WBC 7.4  --   NEUTROABS 5.0  --   HGB 13.8 16.0*  HCT 39.8 47.0*  MCV 102.8*  --   PLT 267  --     Cardiac Enzymes: No results for input(s): CKTOTAL, CKMB, CKMBINDEX, TROPONINI in the last 168 hours.  Lipid Panel: No results for input(s): CHOL, TRIG, HDL, CHOLHDL, VLDL, LDLCALC in the last 168 hours.  CBG: No results for input(s): GLUCAP in the last 168 hours.  Microbiology: Results for orders placed or performed in visit on 05/29/14  Urine Culture     Status: None   Collection Time: 05/29/14  4:08 PM  Result Value Ref Range Status   Colony Count >=100,000 COLONIES/ML  Final   Organism ID, Bacteria Multiple bacterial morphotypes present, none  Final   Organism ID, Bacteria predominant. Suggest appropriate recollection if   Final   Organism ID, Bacteria clinically indicated.  Final    Coagulation Studies:  Recent Labs  06/13/15 1433  LABPROT 13.3  INR 0.99    Imaging: Dg Chest 2 View  06/13/2015  CLINICAL DATA:  TIA EXAM: CHEST  2 VIEW COMPARISON:  06/14/2012 FINDINGS: Lungs are clear.  No pleural effusion or pneumothorax. The heart is normal in size. Mild degenerative changes of the visualized thoracolumbar spine. IMPRESSION: No evidence of acute cardiopulmonary disease. Electronically Signed   By: Julian Hy M.D.   On: 06/13/2015 18:16   Ct Head Wo Contrast  06/13/2015  CLINICAL DATA:  Left arm and left leg weakness today. Code stroke. Initial encounter. EXAM: CT HEAD WITHOUT CONTRAST TECHNIQUE: Contiguous axial images were obtained from the base of the skull through the vertex without intravenous contrast. COMPARISON:  Head CT 05/26/2004. FINDINGS: There is no evidence of acute intracranial hemorrhage, mass lesion, brain edema or extra-axial fluid collection. The  ventricles and subarachnoid spaces are prominent for age and mildly progressive. There is no CT evidence of acute cortical infarction. There is mildly progressive periventricular white matter disease, likely due to chronic small vessel ischemic changes. There is some encephalomalacia in the left parietal lobe. The visualized  paranasal sinuses, mastoid air cells and middle ears are clear. The calvarium is intact. IMPRESSION: Progressive atrophy and chronic small vessel ischemic changes. No CT evidence of acute stroke or hemorrhage. These results were called by telephone at the time of interpretation on 06/13/2015 at 2:56 pm to Lynwood Dawley, PA for stroke team, who verbally acknowledged these results. Electronically Signed   By: Richardean Sale M.D.   On: 06/13/2015 15:00   Mr Brain Wo Contrast  06/13/2015  CLINICAL DATA:  TIA. Hypertension. Left-sided weakness and facial droop EXAM: MRI HEAD WITHOUT CONTRAST MRA HEAD WITHOUT CONTRAST TECHNIQUE: Multiplanar, multiecho pulse sequences of the brain and surrounding structures were obtained without intravenous contrast. Angiographic images of the head were obtained using MRA technique without contrast. COMPARISON:  CT head 06/13/2015 FINDINGS: MRI HEAD FINDINGS Negative for acute infarct. Moderate atrophy.  Ventricular enlargement consistent with atrophy. Mild chronic microvascular ischemic change in the white matter and pons. Negative for hemorrhage or mass Pituitary normal in size. Normal orbit bilaterally. Mild mucosal edema in the paranasal sinuses without air-fluid level. MRA HEAD FINDINGS Left vertebral artery dominant and patent to the basilar. Left PICA patent. Right vertebral artery nondominant and ends in PICA. Basilar patent. Fetal origin right posterior cerebral artery. Hypoplastic right P1 segment. Posterior cerebral arteries are patent bilaterally. Left posterior communicating artery is patent. Internal carotid artery is patent bilaterally without  stenosis or aneurysm. Anterior and middle cerebral arteries patent bilaterally without stenosis or aneurysm. IMPRESSION: Atrophy and mild chronic microvascular ischemia Negative for acute infarct Negative MRA of the head Electronically Signed   By: Franchot Gallo M.D.   On: 06/13/2015 19:52   Mr Jodene Nam Head/brain Wo Cm  06/13/2015  CLINICAL DATA:  TIA. Hypertension. Left-sided weakness and facial droop EXAM: MRI HEAD WITHOUT CONTRAST MRA HEAD WITHOUT CONTRAST TECHNIQUE: Multiplanar, multiecho pulse sequences of the brain and surrounding structures were obtained without intravenous contrast. Angiographic images of the head were obtained using MRA technique without contrast. COMPARISON:  CT head 06/13/2015 FINDINGS: MRI HEAD FINDINGS Negative for acute infarct. Moderate atrophy.  Ventricular enlargement consistent with atrophy. Mild chronic microvascular ischemic change in the white matter and pons. Negative for hemorrhage or mass Pituitary normal in size. Normal orbit bilaterally. Mild mucosal edema in the paranasal sinuses without air-fluid level. MRA HEAD FINDINGS Left vertebral artery dominant and patent to the basilar. Left PICA patent. Right vertebral artery nondominant and ends in PICA. Basilar patent. Fetal origin right posterior cerebral artery. Hypoplastic right P1 segment. Posterior cerebral arteries are patent bilaterally. Left posterior communicating artery is patent. Internal carotid artery is patent bilaterally without stenosis or aneurysm. Anterior and middle cerebral arteries patent bilaterally without stenosis or aneurysm. IMPRESSION: Atrophy and mild chronic microvascular ischemia Negative for acute infarct Negative MRA of the head Electronically Signed   By: Franchot Gallo M.D.   On: 06/13/2015 19:52      Assessment/Plan:  59 year old female patient was brought into the ER with subjective symptoms of gait instability and left leg weakness noticed today by patient. Patient is a poor  historian. Her neurologic examination showed evidence of extrapyramidal symptoms with predominantly moderate rigidity in the left upper and lower extremities, intermittent resting tremor, significant bradykinesia and moderate gait instability with a typical magnetic gait, severely reduced ground clearance. CT of the head showed disproportionately moderate ventral megaly. 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 No evidence of an acute stroke noted clinically or radiologically. No further stroke workup is recommended.  Recommend admission to the hospitalist service for continued neuro diagnostic workup and monitoring, and physical therapy for gait and balance training for falls prevention.  MRI of the brain is recommended, which has been completed at the time of finalizing this note.  On my personal review of the MRI study, disproportionately moderate lateral and third ventricular megaly is seen, with evidence of transependymal CSF flow, which is typically seen with normal pressure hydrocephalus. Recommend neurosurgical consultation for further evaluation, and management options including shunt placement.   Recommend physical therapy to perform Tinetti balance score, which can be used for serial monitoring, if a high volume CSF tap is performed to evaluate for improvement of her gait.   Recommend starting amantadine 100 mg twice a day after breakfast and lunch to help with the extrapyramidal motor symptoms.   Neurology service will continue to follow up during her hospitalization. Please call for any further questions.

## 2015-06-13 NOTE — H&P (Signed)
[facility] History and Physical                                                                                    [name], is a 59 y.o. female  MRN: [id number]   DOB - 01/09/1956  Admit Date - 06/13/2015  Outpatient Primary MD for the patient is Nicole Koyanagi, DO  Referring Physician:  Dr. Stark Jock  Chief Complaint:   Chief Complaint  Patient presents with  . Facial Droop     HPI  [name]  is a 59 y.o. female, with hypertension, depression, history of substance abuse. 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 The patient reports feeling fine yesterday. She went to work this morning and at about 9:45 AM felt as though her left leg was stuck to the floor. She had great difficulty walking. She attempted to pick up a glass of ice water with her left arm and the water fell out of her hand.  She came to the emergency department and noticed left-sided facial droop. In the emergency department CT head is negative for acute stroke. Serologies show a sodium of 127-129, chloride 94. Urine is pending. UDS is clean.  Neurology was called by the EDP for possible code stroke.  Review of Systems  Eyes: Negative.   Respiratory: Negative.   Cardiovascular: Negative.   Gastrointestinal: Negative.   Genitourinary: Negative.   Musculoskeletal: Negative.   Skin: Positive for rash.  Neurological: Positive for dizziness, tremors, focal weakness, weakness and headaches.  Endo/Heme/Allergies: Negative.   Psychiatric/Behavioral: Negative.      Past Medical History  Past Medical History  Diagnosis Date  . Hypertension   . Anxiety   . Depression   . Substance abuse   . Cancer ([place]) 04/2014    squamous cell carcinoma L inner thigh    Past Surgical History  Procedure Laterality Date  . Nose surgery      Epistaxis      Social History Social History  Substance Use Topics  . Smoking status: Never Smoker   .  Smokeless tobacco: Never Used  . Alcohol Use: No     Comment: rare-- 2 x a month   patient lives at home alone. Is independent with ADLs. Works as a Quarry manager. Denies recreational drug use. Denies tobacco use. States she drinks maybe a glass of wine a week.   Family History Family History  Problem Relation Age of Onset  . Hypertension Mother   . Kidney disease Mother     dialysis  . Alzheimer's disease Mother   . Hyperlipidemia Father   paternal grandmother with CVA  Prior to Admission medications   Medication Sig Start Date End Date Taking? Authorizing Provider  alendronate (FOSAMAX) 70 MG tablet Take 1 tablet (70 mg total) by mouth once a week. Take with a full glass of water on an empty stomach. 09/17/14  Yes Yvonne R Lowne, DO  lisinopril (PRINIVIL,ZESTRIL) 40 MG tablet TAKE ONE TABLET BY MOUTH ONE TIME DAILY 05/13/15  Yes Alferd Apa Lowne, DO  metoprolol succinate (TOPROL-XL) 50 MG 24 hr tablet Take 1 tablet (  50 mg total) by mouth daily. Take with or immediately following a meal. 06/30/14  Yes Rosalita Chessman, DO    Allergies  Allergen Reactions  . Codeine     REACTION: insomnia and anxious    Physical Exam  Vitals  Blood pressure 175/106, pulse 71, temperature 98.2 F (36.8 C), temperature source Oral, resp. rate 18, SpO2 100 %.   General:   well-developed well-nourished female lying in bed. Noticeable tremor and left-sided facial droop   Psych:  Normal affect and insight, Not Suicidal or Homicidal, Awake Alert, Oriented X 3.  Neuro:   left-sided facial droop that does not affect her forehead, no tongue deviation, extremity strength and sensation is intact and symmetric. Patient dizzy and ataxic on standing and attempting to walk. +tremoring face and hands.  ENT:  Ears and Eyes appear Normal, Conjunctivae clear, PER. Moist oral mucosa without erythema or exudates.  Neck:  Supple, No lymphadenopathy appreciated  Respiratory:  Symmetrical chest wall movement, Good air movement  bilaterally, CTAB.  Cardiac:  RRR, No Murmurs, no LE edema noted, no JVD.    Abdomen:  Positive bowel sounds, Soft, Non tender, Non distended,  No masses appreciated  Skin:  No Cyanosis, Normal Skin Turgor, red flaky skin patches that extend from her left earlobe across her lower jaw  Extremities:  Able to move all 4. 5/5 strength in each,  no effusions.    Data Review  Wt Readings from Last 3 Encounters:  06/30/14 50.712 kg (111 lb 12.8 oz)  05/29/14 51.166 kg (112 lb 12.8 oz)  03/13/14 50.9 kg (112 lb 3.4 oz)    CBC  Recent Labs Lab 06/13/15 1433 06/13/15 1438  WBC 7.4  --   HGB 13.8 16.0*  HCT 39.8 47.0*  PLT 267  --   MCV 102.8*  --   MCH 35.7*  --   MCHC 34.7  --   RDW 11.9  --   LYMPHSABS 1.4  --   MONOABS 0.8  --   EOSABS 0.1  --   BASOSABS 0.0  --     Chemistries   Recent Labs Lab 06/13/15 1433 06/13/15 1438  NA 127* 129*  K 4.0 3.9  CL 94* 94*  CO2 23  --   GLUCOSE 117* 116*  BUN 9 8  CREATININE 0.80 0.80  CALCIUM 9.5  --   AST 43*  --   ALT 26  --   ALKPHOS 66  --   BILITOT 0.8  --       Coagulation profile  Recent Labs Lab 06/13/15 1433  INR 0.99     Urinalysis: Pending   Imaging results:   Ct Head Wo Contrast  06/13/2015  CLINICAL DATA:  Left arm and left leg weakness today. Code stroke. Initial encounter. EXAM: CT HEAD WITHOUT CONTRAST TECHNIQUE: Contiguous axial images were obtained from the base of the skull through the vertex without intravenous contrast. COMPARISON:  Head CT 05/26/2004. FINDINGS: There is no evidence of acute intracranial hemorrhage, mass lesion, brain edema or extra-axial fluid collection. The ventricles and subarachnoid spaces are prominent for age and mildly progressive. There is no CT evidence of acute cortical infarction. There is mildly progressive periventricular white matter disease, likely due to chronic small vessel ischemic changes. There is some encephalomalacia in the left parietal lobe. The  visualized paranasal sinuses, mastoid air cells and middle ears are clear. The calvarium is intact. IMPRESSION: Progressive atrophy and chronic small vessel ischemic changes. No CT evidence of  acute stroke or hemorrhage. These results were called by telephone at the time of interpretation on 06/13/2015 at 2:56 pm to Lynwood Dawley, PA for stroke team, who verbally acknowledged these results. Electronically Signed   By: Richardean Sale M.D.   On: 06/13/2015 15:00    My personal review of EKG: NSR, No  acuteST changes noted. QTC not prolonged.    Assessment & Plan  Principal Problem:   TIA (transient ischemic attack) Active Problems:   Essential hypertension   Left leg weakness   Hyponatremia   Left-sided deficits left arm and leg weakness appear to have resolved. Left-sided facial droop remains. No tongue deviation. Question TIA, possible Bell's palsy.  Patient is notably ataxic. Will admit for TIA workup. MRI/MRA/echo/carotid/PT/OT/speech evaluations are pending. We'll place on 325 mg aspirin empirically. Lipid panel will be drawn in the morning. Hold blood pressure medications to allow for permissive hypertension. Hydralazine when necessary  Hypertension Hold metoprolol and lisinopril to allow for permissive hypertension. We'll order hydralazine for systolic blood pressure greater than 200.  Hyponatremia Baseline sodium appears to be about 130 to 135. We'll check serum osmoles/urine osmoles/urine sodium. Place on gentle IV fluids.     Consultants Called:     neurology   Family Communication:      father at bedside   Code Status:    Full code  Condition:    Guarded  Potential Disposition:   To home in 24 - 48 hours.  Time spent in minutes : [place],  [place] on 06/13/2015 at 4:30 PM Between 7am to 7pm - Pager - [phone] After 7pm go to [email or web address] - password [email or web address] And look for the night coverage person covering me after hours

## 2015-06-13 NOTE — ED Provider Notes (Addendum)
59 year old female history of hypertension presents today with last known normal initially reported at 47 but patient now states it is at 11:00. She began noticing left facial droop. She presented to urgent care and was paged out as  Code stroke. She has a left facial droop and states that she was dragging her left leg. My evaluation at the bridge she has an obvious left facial droop-at this point, code stroke initiated.  Patient directly to scanner. Report from nurse that patient assigned to pod e  Pattricia Boss, MD 06/13/15 [place], MD 06/13/15 [phone]

## 2015-06-13 NOTE — ED Notes (Signed)
Per EMS: pt coming from UC with c/o left side facial droop. 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 LSN 0945 per EMS, pt states she felt her face different and dragging her left leg around 1100. Pt A&Ox4, respirations equal and unlabored, skin warm and dry

## 2015-06-13 NOTE — Progress Notes (Signed)
Received from ER via stretcher; transferred to bed; patient is alert and oriented; oriented to room and unit routine.

## 2015-06-13 NOTE — ED Provider Notes (Signed)
CSN: [id number]     Arrival date & time 06/13/15  1430 History   First MD Initiated Contact with Patient 06/13/15 1446     Chief Complaint  Patient presents with  . Facial Droop    An emergency department physician performed an initial assessment on this suspected stroke patient at 64. (Consider location/radiation/quality/duration/timing/severity/associated sxs/prior Treatment) HPI Comments: Patient is a 59 year old female with history of hypertension. She is brought by EMS for evaluation of a possible stroke. She apparently developed left leg heaviness and difficulty speaking at home this morning. EMS was called and the patient was transported here. She was said to have had a facial droop. A code stroke was initiated by EMS and patient arrived here. She tells me her symptoms began at approximately 11:00 this morning.  The history is provided by the patient.    Past Medical History  Diagnosis Date  . Hypertension   . Anxiety   . Depression   . Substance abuse   . Cancer ([place]) 04/2014    squamous cell carcinoma L inner thigh   Past Surgical History  Procedure Laterality Date  . Nose surgery      Epistaxis   Family History  Problem Relation Age of Onset  . Hypertension Mother   . Kidney disease Mother     dialysis  . Alzheimer's disease Mother   . Hyperlipidemia Father    Social History  Substance Use Topics  . Smoking status: Never Smoker   . Smokeless tobacco: Never Used  . Alcohol Use: No     Comment: rare-- 2 x a month   OB History    No data available     Review of Systems  All other systems reviewed and are negative.     Allergies  Codeine  Home Medications   Prior to Admission medications   Medication Sig Start Date End Date Taking? Authorizing Provider  alendronate (FOSAMAX) 70 MG tablet Take 1 tablet (70 mg total) by mouth once a week. Take with a full glass of water on an empty stomach. 09/17/14   Rosalita Chessman, DO  lisinopril (PRINIVIL,ZESTRIL)  40 MG tablet TAKE ONE TABLET BY MOUTH ONE TIME DAILY 05/13/15   Rosalita Chessman, DO  metoprolol succinate (TOPROL-XL) 50 MG 24 hr tablet Take 1 tablet (50 mg total) by mouth daily. Take with or immediately following a meal. 06/30/14   Alferd Apa Lowne, DO   BP 194/101 mmHg  Pulse 73  Temp(Src) 98.2 F (36.8 C) (Oral)  Resp 16  SpO2 100% Physical Exam  Constitutional: She is oriented to person, place, and time. She appears well-developed and well-nourished. No distress.  HENT:  Head: Normocephalic and atraumatic.  Neck: Normal range of motion. Neck supple.  Cardiovascular: Normal rate and regular rhythm.  Exam reveals no gallop and no friction rub.   No murmur heard. Pulmonary/Chest: Effort normal and breath sounds normal. No respiratory distress. She has no wheezes.  Abdominal: Soft. Bowel sounds are normal. She exhibits no distension. There is no tenderness.  Musculoskeletal: Normal range of motion.  Neurological: She is alert and oriented to person, place, and time. No cranial nerve deficit. Coordination normal.  She is noted to ambulate with a shuffling gait. Strength is 5 out of 5 in all 4 extremities. There is perhaps a slight facial droop on the left.  Skin: Skin is warm and dry. She is not diaphoretic.  Nursing note and vitals reviewed.   ED Course  Procedures (including critical care  time) Labs Review Labs Reviewed  ETHANOL - Abnormal; Notable for the following:    Alcohol, Ethyl (B) 49 (*)    All other components within normal limits  CBC - Abnormal; Notable for the following:    MCV 102.8 (*)    MCH 35.7 (*)    All other components within normal limits  COMPREHENSIVE METABOLIC PANEL - Abnormal; Notable for the following:    Sodium 127 (*)    Chloride 94 (*)    Glucose, Bld 117 (*)    AST 43 (*)    All other components within normal limits  I-STAT CHEM 8, ED - Abnormal; Notable for the following:    Sodium 129 (*)    Chloride 94 (*)    Glucose, Bld 116 (*)     Calcium, Ion 1.08 (*)    Hemoglobin 16.0 (*)    HCT 47.0 (*)    All other components within normal limits  PROTIME-INR  APTT  DIFFERENTIAL  URINE RAPID DRUG SCREEN, HOSP PERFORMED  URINALYSIS, ROUTINE W REFLEX MICROSCOPIC (NOT AT [facility])  I-STAT TROPOININ, ED    Imaging Review Ct Head Wo Contrast  06/13/2015  CLINICAL DATA:  Left arm and left leg weakness today. Code stroke. Initial encounter. EXAM: CT HEAD WITHOUT CONTRAST TECHNIQUE: Contiguous axial images were obtained from the base of the skull through the vertex without intravenous contrast. COMPARISON:  Head CT 05/26/2004. FINDINGS: There is no evidence of acute intracranial hemorrhage, mass lesion, brain edema or extra-axial fluid collection. The ventricles and subarachnoid spaces are prominent for age and mildly progressive. There is no CT evidence of acute cortical infarction. There is mildly progressive periventricular white matter disease, likely due to chronic small vessel ischemic changes. There is some encephalomalacia in the left parietal lobe. The visualized paranasal sinuses, mastoid air cells and middle ears are clear. The calvarium is intact. IMPRESSION: Progressive atrophy and chronic small vessel ischemic changes. No CT evidence of acute stroke or hemorrhage. These results were called by telephone at the time of interpretation on 06/13/2015 at 2:56 pm to Lynwood Dawley, PA for stroke team, who verbally acknowledged these results. Electronically Signed   By: Richardean Sale M.D.   On: 06/13/2015 15:00   I have personally reviewed and evaluated these images and lab results as part of my medical decision-making.   EKG Interpretation   Date/Time:  Sunday June 13 2015 15:20:49 EST Ventricular Rate:  68 PR Interval:  168 QRS Duration: 75 QT Interval:  389 QTC Calculation: 414 R Axis:     Text Interpretation:  Sinus rhythm Anterior infarct, old Confirmed by Haidan Nhan   MD, Asyria Kolander ([id number]) on 06/13/2015 3:25:47 PM      MDM    Final diagnoses:  None    Patient arrived here as a code stroke. She was evaluated immediately by neurology after undergoing a head CT. This shows no acute stroke, however neurology was concerned about the size of the ventricles. He suspects from her neuro exam findings that this is normal pressure hydrocephalus. He is recommending admission for MRI, physical therapy, and possibly neurosurgical consultation. I discussed the case with Patria Mane who will evaluate patient in the ER. She will be admitted to the hospitalist service.    Veryl Speak, MD 06/13/15 [phone]

## 2015-06-13 NOTE — ED Notes (Signed)
hosp at the  bs

## 2015-06-13 NOTE — ED Notes (Signed)
Report called to 5m 

## 2015-06-14 ENCOUNTER — Encounter (HOSPITAL_COMMUNITY): Payer: Self-pay | Admitting: General Practice

## 2015-06-14 ENCOUNTER — Inpatient Hospital Stay (HOSPITAL_COMMUNITY): Payer: BLUE CROSS/BLUE SHIELD

## 2015-06-14 DIAGNOSIS — I639 Cerebral infarction, unspecified: Secondary | ICD-10-CM

## 2015-06-14 DIAGNOSIS — I10 Essential (primary) hypertension: Secondary | ICD-10-CM

## 2015-06-14 DIAGNOSIS — E871 Hypo-osmolality and hyponatremia: Secondary | ICD-10-CM

## 2015-06-14 DIAGNOSIS — G459 Transient cerebral ischemic attack, unspecified: Secondary | ICD-10-CM

## 2015-06-14 LAB — LIPID PANEL
CHOL/HDL RATIO: 3.5 ratio
Cholesterol: 225 mg/dL — ABNORMAL HIGH (ref 0–200)
HDL: 65 mg/dL (ref >40)
LDL CALC: 141 mg/dL — AB (ref 0–99)
TRIGLYCERIDES: 96 mg/dL (ref <150)
VLDL: 19 mg/dL (ref 0–40)

## 2015-06-14 LAB — BASIC METABOLIC PANEL
Anion gap: 8 (ref 5–15)
BUN: 25 mg/dL — AB (ref 6–20)
CHLORIDE: 99 mmol/L — AB (ref 101–111)
CO2: 22 mmol/L (ref 22–32)
CREATININE: 0.84 mg/dL (ref 0.44–1.00)
Calcium: 9 mg/dL (ref 8.9–10.3)
GFR calc Af Amer: >60 mL/min (ref >60)
GLUCOSE: 122 mg/dL — AB (ref 65–99)
Potassium: 4 mmol/L (ref 3.5–5.1)
SODIUM: 129 mmol/L — AB (ref 135–145)

## 2015-06-14 LAB — OSMOLALITY, URINE: OSMOLALITY UR: 228 mosm/kg — AB (ref 300–900)

## 2015-06-14 MED ORDER — ZOLPIDEM TARTRATE 5 MG PO TABS
5.0000 mg | ORAL_TABLET | Freq: Every evening | ORAL | Status: DC | PRN
  Administered 2015-06-14: 5 mg via ORAL
  Filled 2015-06-14: qty 1

## 2015-06-14 MED ORDER — METOPROLOL SUCCINATE ER 25 MG PO TB24
50.0000 mg | ORAL_TABLET | Freq: Every day | ORAL | Status: DC
  Administered 2015-06-14 – 2015-06-15 (×2): 50 mg via ORAL
  Filled 2015-06-14 (×2): qty 2

## 2015-06-14 MED ORDER — LISINOPRIL 20 MG PO TABS
40.0000 mg | ORAL_TABLET | Freq: Every day | ORAL | Status: DC
  Administered 2015-06-14 – 2015-06-15 (×2): 40 mg via ORAL
  Filled 2015-06-14 (×2): qty 2

## 2015-06-14 MED ORDER — ATORVASTATIN CALCIUM 10 MG PO TABS
20.0000 mg | ORAL_TABLET | Freq: Every day | ORAL | Status: DC
  Administered 2015-06-14: 20 mg via ORAL
  Filled 2015-06-14: qty 2

## 2015-06-14 NOTE — Evaluation (Signed)
Physical Therapy Evaluation Patient Details Name: [name] MRN: [id number] DOB: Mar 16, 1956 Today's Date: 06/14/2015   History of Present Illness  Pt is an active 58 y/o female who has a PMH significant for HTN. Pt presents after an episode of L sided weakness including facial weakness. Neurosurgery was consulted and per MD MRI and CT are most consistent with ventriculomegaly ex vacuo.  Clinical Impression  Pt admitted with above diagnosis. Pt currently with functional limitations due to the deficits listed below (see PT Problem List). At the time of PT eval pt was able to perform transfers and ambulation with occasional min assist for balance and support. Pt reports she is "almost back to normal" but appears to have some continued difficulty walking. Recommended that pt return home with father so she will have some assistance for the next few days at least. 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 Pt was informed that MD will make final call on return to work. Pt will benefit from skilled PT to increase their independence and safety with mobility to allow discharge to the venue listed below.    Note: Pt was able to complete Berg Balance Scale during session with a score of 40. This is indicative of a significant risk for falls, and recommend use of SPC at d/c until balance improves.     Follow Up Recommendations Home health PT;Supervision for mobility/OOB    Equipment Recommendations  Cane    Recommendations for Other Services       Precautions / Restrictions Precautions Precautions: Fall Restrictions Weight Bearing Restrictions: No      Mobility  Bed Mobility Overal bed mobility: Modified Independent                Transfers Overall transfer level: Needs assistance Equipment used: Rolling walker (2  wheeled);None Transfers: Sit to/from Stand Sit to Stand: Supervision            Ambulation/Gait Ambulation/Gait assistance: Min guard;Min assist Ambulation Distance (Feet): 125 Feet Assistive device: Rolling walker (2 wheeled);None Gait Pattern/deviations: Step-through pattern;Decreased stride length;Wide base of support;Staggering left;Staggering right Gait velocity: Decreased Gait velocity interpretation: Below normal speed for age/gender General Gait Details: With RW, pt required cues for walker placement close to pt's body, and general safety. Without RW, pt required occasional assistance to maintain balance and recover from imbalances.   Stairs Stairs: Yes Stairs assistance: Min guard Stair Management: One rail Left;Alternating pattern;Step to pattern;Forwards Number of Stairs: 6 (2 steps x3) General stair comments: Pt was able to negotiate stairs well without assistance. Close guard provided for safety.   Wheelchair Mobility    Modified Rankin (Stroke Patients Only)       Balance Overall balance assessment: Needs assistance Sitting-balance support: Feet supported;No upper extremity supported Sitting balance-Leahy Scale: Good     Standing balance support: No upper extremity supported;During functional activity Standing balance-Leahy Scale: Poor Standing balance comment: Requires assist                 Standardized Balance Assessment Standardized Balance Assessment : Berg Balance Test Berg Balance Test Sit to Stand: Able to stand without using hands and stabilize independently Standing Unsupported: Able to stand 2 minutes with supervision Sitting with Back Unsupported but Feet Supported on Floor or Stool: Able to sit safely and securely 2 minutes Stand to Sit: Sits safely with minimal use  of hands Transfers: Able to transfer safely, minor use of hands Standing Unsupported with Eyes Closed: Able to stand 10 seconds with supervision Standing Ubsupported  with Feet Together: Able to place feet together independently but unable to hold for 30 seconds From Standing, Reach Forward with Outstretched Arm: Can reach forward >12 cm safely (5") From Standing Position, Pick up Object from Floor: Able to pick up shoe, needs supervision From Standing Position, Turn to Look Behind Over each Shoulder: Looks behind one side only/other side shows less weight shift Turn 360 Degrees: Able to turn 360 degrees safely but slowly Standing Unsupported, Alternately Place Feet on Step/Stool: Able to complete >2 steps/needs minimal assist Standing Unsupported, One Foot in Front: Able to plae foot ahead of the other independently and hold 30 seconds Standing on One Leg: Tries to lift leg/unable to hold 3 seconds but remains standing independently Total Score: 40         Pertinent Vitals/Pain Pain Assessment: No/denies pain    Home Living Family/patient expects to be discharged to:: Private residence Living Arrangements: Alone Available Help at Discharge: Family;Available 24 hours/day Type of Home: House Home Access: Level entry     Home Layout: Two level Home Equipment: Walker - 2 wheels;Cane - single point Additional Comments: Pt states she could go to her father's home where she would have 24 hour assist and no stairs to climb. He has a walk in shower but is unsure if there is a seat.    Prior Function Level of Independence: Independent               Hand Dominance   Dominant Hand: Left    Extremity/Trunk Assessment   Upper Extremity Assessment: Defer to OT evaluation           Lower Extremity Assessment: LLE deficits/detail   LLE Deficits / Details: Strength is equal 5/5 bilaterally. During functional mobility, pt appears very unsteady but unable to pinpoint if unsteadiness is on L side only or if it is bilateral.   Cervical / Trunk Assessment: Normal  Communication   Communication: No difficulties  Cognition Arousal/Alertness:  Awake/alert Behavior During Therapy: WFL for tasks assessed/performed Overall Cognitive Status: Within Functional Limits for tasks assessed                      General Comments      Exercises        Assessment/Plan    PT Assessment Patient needs continued PT services  PT Diagnosis Difficulty walking;Abnormality of gait   PT Problem List Decreased strength;Decreased range of motion;Decreased activity tolerance;Decreased balance;Decreased mobility;Decreased knowledge of use of DME;Decreased coordination;Decreased knowledge of precautions;Decreased safety awareness  PT Treatment Interventions DME instruction;Gait training;Stair training;Functional mobility training;Therapeutic activities;Therapeutic exercise;Neuromuscular re-education;Patient/family education   PT Goals (Current goals can be found in the Care Plan section) Acute Rehab PT Goals Patient Stated Goal: Get back to normal PT Goal Formulation: With patient Time For Goal Achievement: 06/28/15 Potential to Achieve Goals: Good    Frequency Min 4X/week   Barriers to discharge        Co-evaluation               End of Session Equipment Utilized During Treatment: Gait belt Activity Tolerance: Patient tolerated treatment well Patient left: in chair;with call bell/phone within reach;with chair alarm set Nurse Communication: Mobility status         Time: [id number] PT Time Calculation (min) (ACUTE ONLY): 40 min   Charges:  PT Evaluation $Initial PT Evaluation Tier I: 1 Procedure PT Treatments $Gait Training: 8-22 mins $Physical Performance Test: 8-22 mins   PT G Codes:        Rolinda Roan 2015-07-11, 12:09 PM  Rolinda Roan, PT, DPT Acute Rehabilitation Services Pager: [phone]

## 2015-06-14 NOTE — Progress Notes (Signed)
Subjective: Much improved   Exam: Filed Vitals:   06/14/15 0500 06/14/15 0940  BP: 169/90 153/95  Pulse: 67 63  Temp: 98.4 F (36.9 C) 98.4 F (36.9 C)  Resp: 18 18   Gen: In bed, NAD Resp: non-labored breathing, no acute distress Abd: soft, nt  Neuro: MS: awake, alert, interactive and appropriate CN:? assymetry of the face with left side slightly less prominent NL fold.  Motor: 5/5 in leg and arm.  Sensory:intact to LT  Pertinent Labs: Na 129  MRI reviewed - ? Mild haziness in the right basal ganglia on DWI  Impression: 59 yo F with transient left sided weakness. She still appears to have some mild deficits in walking and facial asymmetry. There is a haziness in the right basal ganglia which could be artficat, but I wonder if it represents a mostly resolved infarct.   If this did represent NPH, then I do not think that this would explain her transient left sided symptoms and therefore would recommend treating this event as TIA/stroke.   She had no complaints of walking dysfunction, memory difficulty or urinary incontenince prior to yesterday and I am therefore hesitant to pursue an NPH workup acutely. I would favor letting her recover from this acute event and then if she continues to have gait difficulty having an LP with PT evaluation pursued at that time.   Recommendations: 1) LDL 141, will need statin therapy.  2) Start antiplatelet tehrapy with ASA 3) A1C pending, treat DM if found.  4) echo, CArotid doppler.  5) will d/c amantadine, could try to look for theraputic effect at a later date when she has recovered from this acute event.  6) will follow.   Roland Rack, MD [facility] [phone]  If 7pm- 7am, please page neurology on call as listed in [place].

## 2015-06-14 NOTE — Consult Note (Signed)
Reason for Consult: Hydrocephalus Referring Physician: Dr. Narda Rutherford is an 59 y.o. female.  HPI: Patient is a 59 year old female who has past history only significant for hypertension who maintains a very active lifestyle exercising regularly who had an episode of left leg being heavy explored into her arm and ultimately involves her left face. She did notthe fascial involvement until somebody commented on it to her. She has some difficulty with weakness on the left side of her body both arm and leg with difficulty walking because of that. She now reports is significantly better her left arm and leg feel normal to her she still has left-sided facial weakness. She denies any other constitutional signs headaches nausea vomiting denies any memory difficulties incontinence or walking difficulties unrelated to this event.  Past Medical History  Diagnosis Date  . Hypertension   . Anxiety   . Depression   . Substance abuse   . Cancer ([place]) 04/2014    squamous cell carcinoma L inner thigh    Past Surgical History  Procedure Laterality Date  . Nose surgery      Epistaxis    Family History  Problem Relation Age of Onset  . Hypertension Mother   . Kidney disease Mother     dialysis  . Alzheimer's disease Mother   . Hyperlipidemia Father   . Stroke Paternal Grandmother     Social History:  reports that she has never smoked. She has never used smokeless tobacco. She reports that she does not drink alcohol or use illicit drugs.  Allergies:  Allergies  Allergen Reactions  . Codeine     REACTION: insomnia and anxious    Medications: I have reviewed the patient's current medications.  Results for orders placed or performed during the hospital encounter of 06/13/15 (from the past 48 hour(s))  Ethanol     Status: Abnormal   Collection Time: 06/13/15  2:33 PM  Result Value Ref Range   Alcohol, Ethyl (B) 49 (H) <5 mg/dL    Comment:        LOWEST DETECTABLE LIMIT  FOR SERUM ALCOHOL IS 5 mg/dL FOR MEDICAL PURPOSES ONLY   Protime-INR     Status: None   Collection Time: 06/13/15  2:33 PM  Result Value Ref Range   Prothrombin Time 13.3 11.6 - 15.2 seconds   INR 0.99 0.00 - 1.49  APTT     Status: None   Collection Time: 06/13/15  2:33 PM  Result Value Ref Range   aPTT 25 24 - 37 seconds  CBC     Status: Abnormal   Collection Time: 06/13/15  2:33 PM  Result Value Ref Range   WBC 7.4 4.0 - 10.5 K/uL   RBC 3.87 3.87 - 5.11 MIL/uL   Hemoglobin 13.8 12.0 - 15.0 g/dL   HCT 39.8 36.0 - 46.0 %   MCV 102.8 (H) 78.0 - 100.0 fL   MCH 35.7 (H) 26.0 - 34.0 pg   MCHC 34.7 30.0 - 36.0 g/dL   RDW 11.9 11.5 - 15.5 %   Platelets 267 150 - 400 K/uL  Differential     Status: None   Collection Time: 06/13/15  2:33 PM  Result Value Ref Range   Neutrophils Relative % 67 %   Neutro Abs 5.0 1.7 - 7.7 K/uL   Lymphocytes Relative 19 %   Lymphs Abs 1.4 0.7 - 4.0 K/uL   Monocytes Relative 11 %   Monocytes Absolute 0.8 0.1 - 1.0 K/uL  Eosinophils Relative 2 %   Eosinophils Absolute 0.1 0.0 - 0.7 K/uL   Basophils Relative 1 %   Basophils Absolute 0.0 0.0 - 0.1 K/uL  Comprehensive metabolic panel     Status: Abnormal   Collection Time: 06/13/15  2:33 PM  Result Value Ref Range   Sodium 127 (L) 135 - 145 mmol/L   Potassium 4.0 3.5 - 5.1 mmol/L   Chloride 94 (L) 101 - 111 mmol/L   CO2 23 22 - 32 mmol/L   Glucose, Bld 117 (H) 65 - 99 mg/dL   BUN 9 6 - 20 mg/dL   Creatinine, Ser 0.80 0.44 - 1.00 mg/dL   Calcium 9.5 8.9 - 10.3 mg/dL   Total Protein 7.3 6.5 - 8.1 g/dL   Albumin 4.4 3.5 - 5.0 g/dL   AST 43 (H) 15 - 41 U/L   ALT 26 14 - 54 U/L   Alkaline Phosphatase 66 38 - 126 U/L   Total Bilirubin 0.8 0.3 - 1.2 mg/dL   GFR calc non Af Amer >60 >60 mL/min   GFR calc Af Amer >60 >60 mL/min    Comment: (NOTE) The eGFR has been calculated using the CKD EPI equation. This calculation has not been validated in all clinical situations. eGFR's persistently <60  mL/min signify possible Chronic Kidney Disease.    Anion gap 10 5 - 15  I-stat troponin, ED (not at [facility], [facility])     Status: None   Collection Time: 06/13/15  2:37 PM  Result Value Ref Range   Troponin i, poc 0.00 0.00 - 0.08 ng/mL   Comment 3            Comment: Due to the release kinetics of cTnI, a negative result within the first hours of the onset of symptoms does not rule out myocardial infarction with certainty. If myocardial infarction is still suspected, repeat the test at appropriate intervals.   I-Stat Chem 8, ED  (not at [facility], [facility])     Status: Abnormal   Collection Time: 06/13/15  2:38 PM  Result Value Ref Range   Sodium 129 (L) 135 - 145 mmol/L   Potassium 3.9 3.5 - 5.1 mmol/L   Chloride 94 (L) 101 - 111 mmol/L   BUN 8 6 - 20 mg/dL   Creatinine, Ser 0.80 0.44 - 1.00 mg/dL   Glucose, Bld 116 (H) 65 - 99 mg/dL   Calcium, Ion 1.08 (L) 1.12 - 1.23 mmol/L   TCO2 24 0 - 100 mmol/L   Hemoglobin 16.0 (H) 12.0 - 15.0 g/dL   HCT 47.0 (H) 36.0 - 46.0 %  Urine rapid drug screen (hosp performed)not at [facility]     Status: None   Collection Time: 06/13/15  3:29 PM  Result Value Ref Range   Opiates NONE DETECTED NONE DETECTED   Cocaine NONE DETECTED NONE DETECTED   Benzodiazepines NONE DETECTED NONE DETECTED   Amphetamines NONE DETECTED NONE DETECTED   Tetrahydrocannabinol NONE DETECTED NONE DETECTED   Barbiturates NONE DETECTED NONE DETECTED    Comment:        DRUG SCREEN FOR MEDICAL PURPOSES ONLY.  IF CONFIRMATION IS NEEDED FOR ANY PURPOSE, NOTIFY LAB WITHIN 5 DAYS.        LOWEST DETECTABLE LIMITS FOR URINE DRUG SCREEN Drug Class       Cutoff (ng/mL) Amphetamine      1000 Barbiturate      200 Benzodiazepine   151 Tricyclics       761 Opiates  300 Cocaine          300 THC              50   Urinalysis, Routine w reflex microscopic (not at [facility])     Status: Abnormal   Collection Time: 06/13/15  3:29 PM  Result Value Ref Range   Color, Urine YELLOW YELLOW    APPearance CLEAR CLEAR   Specific Gravity, Urine 1.005 1.005 - 1.030   pH 7.0 5.0 - 8.0   Glucose, UA NEGATIVE NEGATIVE mg/dL   Hgb urine dipstick SMALL (A) NEGATIVE   Bilirubin Urine NEGATIVE NEGATIVE   Ketones, ur NEGATIVE NEGATIVE mg/dL   Protein, ur NEGATIVE NEGATIVE mg/dL   Nitrite NEGATIVE NEGATIVE   Leukocytes, UA SMALL (A) NEGATIVE  Urine microscopic-add on     Status: Abnormal   Collection Time: 06/13/15  3:29 PM  Result Value Ref Range   Squamous Epithelial / LPF 0-5 (A) NONE SEEN   WBC, UA 6-30 0 - 5 WBC/hpf    Comment: IN CLUMPS   RBC / HPF 0-5 0 - 5 RBC/hpf   Bacteria, UA NONE SEEN NONE SEEN  Sodium, urine, random     Status: None   Collection Time: 06/13/15  8:17 PM  Result Value Ref Range   Sodium, Ur 71 mmol/L  Osmolality, urine     Status: Abnormal   Collection Time: 06/13/15  8:17 PM  Result Value Ref Range   Osmolality, Ur 227 (L) 300 - 900 mOsm/kg  Osmolality     Status: Abnormal   Collection Time: 06/13/15  8:17 PM  Result Value Ref Range   Osmolality 269 (L) 275 - 295 mOsm/kg    Comment: Please note change in reference range.  Cortisol     Status: None   Collection Time: 06/13/15  8:17 PM  Result Value Ref Range   Cortisol, Plasma 13.2 ug/dL    Comment: (NOTE) AM    6.7 - 22.6 ug/dL PM   <10.0       ug/dL   TSH     Status: None   Collection Time: 06/13/15  8:17 PM  Result Value Ref Range   TSH 2.760 0.350 - 4.500 uIU/mL  Lipid panel     Status: Abnormal   Collection Time: 06/14/15  3:20 AM  Result Value Ref Range   Cholesterol 225 (H) 0 - 200 mg/dL   Triglycerides 96 <150 mg/dL   HDL 65 >40 mg/dL   Total CHOL/HDL Ratio 3.5 RATIO   VLDL 19 0 - 40 mg/dL   LDL Cholesterol 141 (H) 0 - 99 mg/dL    Comment:        Total Cholesterol/HDL:CHD Risk Coronary Heart Disease Risk Table                     Men   Women  1/2 Average Risk   3.4   3.3  Average Risk       5.0   4.4  2 X Average Risk   9.6   7.1  3 X Average Risk  23.4   11.0        Use  the calculated Patient Ratio above and the CHD Risk Table to determine the patient's CHD Risk.        ATP III CLASSIFICATION (LDL):  <100     mg/dL   Optimal  100-129  mg/dL   Near or Above  Optimal  130-159  mg/dL   Borderline  160-189  mg/dL   High  >190     mg/dL   Very High     Dg Chest 2 View  06/13/2015  CLINICAL DATA:  TIA EXAM: CHEST  2 VIEW COMPARISON:  06/14/2012 FINDINGS: Lungs are clear.  No pleural effusion or pneumothorax. The heart is normal in size. Mild degenerative changes of the visualized thoracolumbar spine. IMPRESSION: No evidence of acute cardiopulmonary disease. Electronically Signed   By: Julian Hy M.D.   On: 06/13/2015 18:16   Ct Head Wo Contrast  06/13/2015  CLINICAL DATA:  Left arm and left leg weakness today. Code stroke. Initial encounter. EXAM: CT HEAD WITHOUT CONTRAST TECHNIQUE: Contiguous axial images were obtained from the base of the skull through the vertex without intravenous contrast. COMPARISON:  Head CT 05/26/2004. FINDINGS: There is no evidence of acute intracranial hemorrhage, mass lesion, brain edema or extra-axial fluid collection. The ventricles and subarachnoid spaces are prominent for age and mildly progressive. There is no CT evidence of acute cortical infarction. There is mildly progressive periventricular white matter disease, likely due to chronic small vessel ischemic changes. There is some encephalomalacia in the left parietal lobe. The visualized paranasal sinuses, mastoid air cells and middle ears are clear. The calvarium is intact. IMPRESSION: Progressive atrophy and chronic small vessel ischemic changes. No CT evidence of acute stroke or hemorrhage. These results were called by telephone at the time of interpretation on 06/13/2015 at 2:56 pm to Lynwood Dawley, PA for stroke team, who verbally acknowledged these results. Electronically Signed   By: Richardean Sale M.D.   On: 06/13/2015 15:00   Mr Brain Wo  Contrast  06/13/2015  CLINICAL DATA:  TIA. Hypertension. Left-sided weakness and facial droop EXAM: MRI HEAD WITHOUT CONTRAST MRA HEAD WITHOUT CONTRAST TECHNIQUE: Multiplanar, multiecho pulse sequences of the brain and surrounding structures were obtained without intravenous contrast. Angiographic images of the head were obtained using MRA technique without contrast. COMPARISON:  CT head 06/13/2015 FINDINGS: MRI HEAD FINDINGS Negative for acute infarct. Moderate atrophy.  Ventricular enlargement consistent with atrophy. Mild chronic microvascular ischemic change in the white matter and pons. Negative for hemorrhage or mass Pituitary normal in size. Normal orbit bilaterally. Mild mucosal edema in the paranasal sinuses without air-fluid level. MRA HEAD FINDINGS Left vertebral artery dominant and patent to the basilar. Left PICA patent. Right vertebral artery nondominant and ends in PICA. Basilar patent. Fetal origin right posterior cerebral artery. Hypoplastic right P1 segment. Posterior cerebral arteries are patent bilaterally. Left posterior communicating artery is patent. Internal carotid artery is patent bilaterally without stenosis or aneurysm. Anterior and middle cerebral arteries patent bilaterally without stenosis or aneurysm. IMPRESSION: Atrophy and mild chronic microvascular ischemia Negative for acute infarct Negative MRA of the head Electronically Signed   By: Franchot Gallo M.D.   On: 06/13/2015 19:52   Mr Jodene Nam Head/brain Wo Cm  06/13/2015  CLINICAL DATA:  TIA. Hypertension. Left-sided weakness and facial droop EXAM: MRI HEAD WITHOUT CONTRAST MRA HEAD WITHOUT CONTRAST TECHNIQUE: Multiplanar, multiecho pulse sequences of the brain and surrounding structures were obtained without intravenous contrast. Angiographic images of the head were obtained using MRA technique without contrast. COMPARISON:  CT head 06/13/2015 FINDINGS: MRI HEAD FINDINGS Negative for acute infarct. Moderate atrophy.  Ventricular  enlargement consistent with atrophy. Mild chronic microvascular ischemic change in the white matter and pons. Negative for hemorrhage or mass Pituitary normal in size. Normal orbit bilaterally. Mild mucosal edema in the paranasal  sinuses without air-fluid level. MRA HEAD FINDINGS Left vertebral artery dominant and patent to the basilar. Left PICA patent. Right vertebral artery nondominant and ends in PICA. Basilar patent. Fetal origin right posterior cerebral artery. Hypoplastic right P1 segment. Posterior cerebral arteries are patent bilaterally. Left posterior communicating artery is patent. Internal carotid artery is patent bilaterally without stenosis or aneurysm. Anterior and middle cerebral arteries patent bilaterally without stenosis or aneurysm. IMPRESSION: Atrophy and mild chronic microvascular ischemia Negative for acute infarct Negative MRA of the head Electronically Signed   By: Franchot Gallo M.D.   On: 06/13/2015 19:52    Review of Systems  Constitutional: Negative.   HENT: Negative.   Eyes: Negative.   Respiratory: Negative.   Gastrointestinal: Negative.   Genitourinary: Negative.   Musculoskeletal: Positive for myalgias.  Skin: Negative.   Neurological: Positive for tingling, sensory change and focal weakness.  Psychiatric/Behavioral: Negative.    Blood pressure 153/95, pulse 63, temperature 98.4 F (36.9 C), temperature source Oral, resp. rate 18, height 5' 1"  (1.549 m), weight 51.302 kg (113 lb 1.6 oz), SpO2 100 %. Physical Exam  Constitutional: She is oriented to person, place, and time.  Neurological: She is alert and oriented to person, place, and time. She has normal strength. GCS eye subscore is 4. GCS verbal subscore is 5. GCS motor subscore is 6. She displays Babinski's sign on the right side. She displays Babinski's sign on the left side.  Reflex Scores:      Patellar reflexes are 3+ on the right side and 3+ on the left side. 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 Reflexes are brisk 3+ knee jerks but no clonus positive Babinskis bilaterally    Assessment/Plan: 59 year old female presents with an event that seems like a TIA possibly could be a seizure with a Todd's paralysis although there was no loss of consciousness associated with it. We have been consult for the question of hydrocephalus. I do not think that either her clinical presentation nor her imaging findings are consistent with hydrocephalus or normal pressure hydrocephalus. I think her MRI and CT are most consistent with ventriculomegaly ex vacuo. I see very little transependymal junction very minimal rounding of the third ventricle normal fourth commensurate with the remainder of the ventricular anatomy and her clinical presentation is in consistent with standard clinical presentation of NPH. I would not recommend any shunting or drainage procedure. However continue to work her up neurologically possibly including a spinal tap her lumbar puncture for chemistries cultures and protein levels. I see no neurosurgical intervention needed.  Sadey Yandell P 06/14/2015, 10:35 AM

## 2015-06-14 NOTE — Progress Notes (Signed)
VASCULAR LAB PRELIMINARY  PRELIMINARY  PRELIMINARY  PRELIMINARY  Carotid duplex  completed.    Preliminary report:  Bilateral:  1-39% ICA stenosis.  Vertebral artery flow is antegrade.      Mattias Walmsley, RVT 06/14/2015, 4:53 PM

## 2015-06-14 NOTE — Progress Notes (Signed)
  Echocardiogram 2D Echocardiogram has been performed.  [name] 06/14/2015, 3:36 PM

## 2015-06-14 NOTE — Progress Notes (Signed)
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 In the ED, CT head was negative for acute stroke. Admitted for stroke workup. Neurology was consulted.   Assessment/Plan:  Possible right basal ganglia stroke/infarct with left hemiparesis - Initial neurology consultation 06/13/15: Not felt to have stroke and recommended ruling out NPH - MRI brain: Reported as negative for acute infarct. Subsequent follow-up by a second neurologist on 12/5: Haziness in the right basal ganglia which could be artifact but wonders if it represents a mostly resolved infarct. NPH would not explain her transient left-sided symptoms and hence recommend treating this event is TIA/stroke. - MRA brain: Negative  - 2-D echo: Pending - Carotid Dopplers: Bilateral: 1-39 percent ICA stenosis. Vertebral artery flow is antegrade. - LDL: 141 - A1c: Pending  - Not candidate for TPA due to rapid resolution of symptoms - Aspirin for secondary prophylaxis. - PT, OT and ST evaluation - Recommend home health PT, cane. OT eval pending.  - Neurology has been discontinued amantadine  - Neurosurgery has evaluated and are not convinced off normal pressure hydrocephalus based on her clinical presentation or her imaging findings. No neurosurgical intervention recommended.   Essential hypertension  - allow for permissive hypertension.  - Resumed home metoprolol and lisinopril.  Hyponatremia, chronic - Unclear etiology. Sodium levels have fluctuated from 126-135 since 2009.  - Asymptomatic of same.  - Serum osmolarity 269. Follow urine osmolarity.   Hyperlipidemia - LDL 141, goal  <70 - Not on statin prior to admission. Started Lipitor 20 MG daily.   DVT prophylaxis: Lovenox  Code Status: Full Family Communication: none at bedside  Disposition Plan: DC home possibly 12/6    Consultants:  Neurology  Procedures:  Neurosurgery   Antibiotics:  None  Subjective: Left-sided weakness resolved. Denies any complaints. No history of urinary incontinence, memory deficits or gait imbalance.   Objective: Filed Vitals:   06/14/15 1610 06/14/15 1621 06/14/15 1731 06/14/15 1748  BP: 198/105 182/102 138/78 134/77  Pulse: 62 61 64 76  Temp:    98.2 F (36.8 C)  TempSrc:    Oral  Resp:    18  Height:      Weight:      SpO2:    100%    Intake/Output Summary (Last 24 hours) at 06/14/15 1811 Last data filed at 06/14/15 1300  Gross per 24 hour  Intake    480 ml  Output      0 ml  Net    480 ml   Filed Weights   06/13/15 2300  Weight: 51.302 kg (113 lb 1.6 oz)     Exam:  General exam: pleasant middle-aged female lying comfortably in bed.  Respiratory system: Clear. No increased work of breathing. Cardiovascular system: S1 & S2 heard, RRR. No JVD, murmurs, gallops, clicks or pedal edema. Telemetry: SB in the 50s-SR. Occasional sinus bradycardia in the 40s-probably during sleep.  Gastrointestinal system: Abdomen is nondistended, soft and nontender. Normal bowel sounds heard. Central nervous system: Alert and oriented. No focal neurological deficits. Extremities: Symmetric 5 x 5 power.   Data Reviewed: Basic Metabolic Panel:  Recent Labs Lab 06/13/15 1433 06/13/15 1438 06/14/15 1700  NA  127* 129* 129*  K 4.0 3.9 4.0  CL 94* 94* 99*  CO2 23  --  22  GLUCOSE 117* 116* 122*  BUN 9 8 25*  CREATININE 0.80 0.80 0.84  CALCIUM 9.5  --  9.0   Liver Function Tests:  Recent Labs Lab 06/13/15 1433  AST 43*  ALT 26  ALKPHOS 66  BILITOT 0.8  PROT 7.3  ALBUMIN 4.4   No results for input(s): LIPASE, AMYLASE in the last 168 hours. No results for  input(s): AMMONIA in the last 168 hours. CBC:  Recent Labs Lab 06/13/15 1433 06/13/15 1438  WBC 7.4  --   NEUTROABS 5.0  --   HGB 13.8 16.0*  HCT 39.8 47.0*  MCV 102.8*  --   PLT 267  --    Cardiac Enzymes: No results for input(s): CKTOTAL, CKMB, CKMBINDEX, TROPONINI in the last 168 hours. BNP (last 3 results) No results for input(s): PROBNP in the last 8760 hours. CBG: No results for input(s): GLUCAP in the last 168 hours.  No results found for this or any previous visit (from the past 240 hour(s)).         Studies: Dg Chest 2 View  06/13/2015  CLINICAL DATA:  TIA EXAM: CHEST  2 VIEW COMPARISON:  06/14/2012 FINDINGS: Lungs are clear.  No pleural effusion or pneumothorax. The heart is normal in size. Mild degenerative changes of the visualized thoracolumbar spine. IMPRESSION: No evidence of acute cardiopulmonary disease. Electronically Signed   By: Julian Hy M.D.   On: 06/13/2015 18:16   Ct Head Wo Contrast  06/13/2015  CLINICAL DATA:  Left arm and left leg weakness today. Code stroke. Initial encounter. EXAM: CT HEAD WITHOUT CONTRAST TECHNIQUE: Contiguous axial images were obtained from the base of the skull through the vertex without intravenous contrast. COMPARISON:  Head CT 05/26/2004. FINDINGS: There is no evidence of acute intracranial hemorrhage, mass lesion, brain edema or extra-axial fluid collection. The ventricles and subarachnoid spaces are prominent for age and mildly progressive. There is no CT evidence of acute cortical infarction. There is mildly progressive periventricular white matter disease, likely due to chronic small vessel ischemic changes. There is some encephalomalacia in the left parietal lobe. The visualized paranasal sinuses, mastoid air cells and middle ears are clear. The calvarium is intact. IMPRESSION: Progressive atrophy and chronic small vessel ischemic changes. No CT evidence of acute stroke or hemorrhage. These results were called by  telephone at the time of interpretation on 06/13/2015 at 2:56 pm to Lynwood Dawley, PA for stroke team, who verbally acknowledged these results. Electronically Signed   By: Richardean Sale M.D.   On: 06/13/2015 15:00   Mr Brain Wo Contrast  06/13/2015  CLINICAL DATA:  TIA. Hypertension. Left-sided weakness and facial droop EXAM: MRI HEAD WITHOUT CONTRAST MRA HEAD WITHOUT CONTRAST TECHNIQUE: Multiplanar, multiecho pulse sequences of the brain and surrounding structures were obtained without intravenous contrast. Angiographic images of the head were obtained using MRA technique without contrast. COMPARISON:  CT head 06/13/2015 FINDINGS: MRI HEAD FINDINGS Negative for acute infarct. Moderate atrophy.  Ventricular enlargement consistent with atrophy. Mild chronic microvascular ischemic change in the white matter and pons. Negative for hemorrhage or mass Pituitary normal in size. Normal orbit bilaterally. Mild mucosal edema in the paranasal sinuses without air-fluid level. MRA HEAD FINDINGS Left vertebral artery dominant and patent to the basilar. Left PICA patent. Right vertebral artery nondominant and ends in PICA. Basilar patent. Fetal origin right posterior cerebral artery. Hypoplastic right P1  segment. Posterior cerebral arteries are patent bilaterally. Left posterior communicating artery is patent. Internal carotid artery is patent bilaterally without stenosis or aneurysm. Anterior and middle cerebral arteries patent bilaterally without stenosis or aneurysm. IMPRESSION: Atrophy and mild chronic microvascular ischemia Negative for acute infarct Negative MRA of the head Electronically Signed   By: Franchot Gallo M.D.   On: 06/13/2015 19:52   Mr Jodene Nam Head/brain Wo Cm  06/13/2015  CLINICAL DATA:  TIA. Hypertension. Left-sided weakness and facial droop EXAM: MRI HEAD WITHOUT CONTRAST MRA HEAD WITHOUT CONTRAST TECHNIQUE: Multiplanar, multiecho pulse sequences of the brain and surrounding structures were obtained  without intravenous contrast. Angiographic images of the head were obtained using MRA technique without contrast. COMPARISON:  CT head 06/13/2015 FINDINGS: MRI HEAD FINDINGS Negative for acute infarct. Moderate atrophy.  Ventricular enlargement consistent with atrophy. Mild chronic microvascular ischemic change in the white matter and pons. Negative for hemorrhage or mass Pituitary normal in size. Normal orbit bilaterally. Mild mucosal edema in the paranasal sinuses without air-fluid level. MRA HEAD FINDINGS Left vertebral artery dominant and patent to the basilar. Left PICA patent. Right vertebral artery nondominant and ends in PICA. Basilar patent. Fetal origin right posterior cerebral artery. Hypoplastic right P1 segment. Posterior cerebral arteries are patent bilaterally. Left posterior communicating artery is patent. Internal carotid artery is patent bilaterally without stenosis or aneurysm. Anterior and middle cerebral arteries patent bilaterally without stenosis or aneurysm. IMPRESSION: Atrophy and mild chronic microvascular ischemia Negative for acute infarct Negative MRA of the head Electronically Signed   By: Franchot Gallo M.D.   On: 06/13/2015 19:52        Scheduled Meds: . aspirin EC  325 mg Oral Daily  . enoxaparin (LOVENOX) injection  40 mg Subcutaneous Q24H  . lisinopril  40 mg Oral Daily  . metoprolol succinate  50 mg Oral Daily   Continuous Infusions: . sodium chloride 75 mL/hr (06/14/15 0755)    Principal Problem:   TIA (transient ischemic attack) Active Problems:   Essential hypertension   Left leg weakness   Hyponatremia    Time spent: 30 minutes.    Vernell Leep, MD, FACP, FHM. [facility] Pager [phone]  If 7PM-7AM, please contact night-coverage [email or web address] Password [email or web address] 06/14/2015, 6:11 PM    LOS: 1 day

## 2015-06-14 NOTE — Progress Notes (Signed)
Patients father Jincy Konicek would like a call in the AM from day nurse or doctor in regard to discharge and results of Doppler and Echo done late today [phone].

## 2015-06-14 NOTE — Evaluation (Signed)
Speech Language Pathology Evaluation Patient Details Name: [name] MRN: [id number] DOB: Jun 22, 1956 Today's Date: 06/14/2015 Time: [id number] SLP Time Calculation (min) (ACUTE ONLY): 17 min  Problem List:  Patient Active Problem List   Diagnosis Date Noted  . Left leg weakness 06/13/2015  . TIA (transient ischemic attack) 06/13/2015  . Hyponatremia 06/13/2015  . Bronchitis 06/14/2012  . H/O ETOH abuse 09/13/2011  . DYSURIA 10/22/2009  . PROTEINURIA, MILD 04/21/2009  . ABNORMAL FINDINGS GI TRACT 12/03/2007  . Essential hypertension 10/22/2007   Past Medical History:  Past Medical History  Diagnosis Date  . Hypertension   . Anxiety   . Depression   . Substance abuse   . Cancer ([place]) 04/2014    squamous cell carcinoma L inner thigh   Past Surgical History:  Past Surgical History  Procedure Laterality Date  . Nose surgery      Epistaxis   HPI:  [name] is a 59 y.o. female, with hypertension, depression, history of substance abuse. 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 The patient reports feeling fine yesterday. She went to work this morning and at about 9:45 AM felt as though her left leg was stuck to the floor. She had great difficulty walking. She attempted to pick up a glass of ice water with her left arm and the water fell out of her hand. She came to the emergency department and noticed left-sided facial droop. In the emergency department CT head is negative for acute stroke. Serologies show a sodium of 127-129, chloride 94. Urine is pending. UDS is clean. Neurology was called by the EDP for possible code stroke.   Assessment / Plan / Recommendation Clinical Impression  Cognitive/linguistic and motor speech evaluation were completed.  The patient achieved a score of 28/30 on the MOCA-B.  Mild issues were noted for recall of 5 novel words given interference.  Immediate recall was good and  given semantic cue the patient was able to recall 5/5 items.  Oral motor skills and motor speech appeared to be within functional limits.  Receptive/expressive language skills appeared to be intact.   The patient was conversant at a conversational level.  Acute ST needs were not identified.  Thank you for the consult.      SLP Assessment  Patient does not need any further Speech Lanaguage Pathology Services             SLP Evaluation Prior Functioning  Cognitive/Linguistic Baseline: Within functional limits Type of Home: House Available Help at Discharge: Family;Available 24 hours/day Vocation: Full time employment (The patient works as a Consulting civil engineer.  )   Cognition  Overall Cognitive Status: Within Functional Limits for tasks assessed Arousal/Alertness: Awake/alert Orientation Level: Oriented X4 Attention: Sustained Sustained Attention: Appears intact Memory: Appears intact Awareness: Appears intact Problem Solving: Appears intact Executive Function: Sequencing Sequencing: Appears intact Safety/Judgment: Appears intact    Comprehension  Auditory Comprehension Overall Auditory Comprehension: Appears within functional limits for tasks assessed Yes/No Questions: Within Functional Limits Commands: Within Functional Limits Conversation: Complex    Expression Expression Primary Mode of Expression: Verbal Verbal Expression Overall Verbal Expression: Appears within functional limits for tasks assessed Initiation: No impairment Automatic Speech: Name;Social Response Level of Generative/Spontaneous Verbalization: Conversation Repetition: No impairment Naming: No impairment Pragmatics: No impairment Non-Verbal Means of Communication: Not applicable Written Expression Dominant Hand: Left Written Expression: Within Functional Limits   Oral / Motor Oral Motor/Sensory Function Overall Oral Motor/Sensory  Function: Within functional limits Motor Speech Overall Motor Speech: Appears  within functional limits for tasks assessed Respiration: Within functional limits Phonation: Normal Resonance: Within functional limits Articulation: Within functional limitis Intelligibility: Intelligible Motor Planning: Witnin functional limits Motor Speech Errors: Not applicable   Shelly Flatten, MA, CCC-SLP Acute Rehab SLP [phone] Lamar Sprinkles 06/14/2015, 2:02 PM

## 2015-06-15 ENCOUNTER — Telehealth: Payer: Self-pay | Admitting: Family

## 2015-06-15 DIAGNOSIS — G458 Other transient cerebral ischemic attacks and related syndromes: Secondary | ICD-10-CM

## 2015-06-15 LAB — BASIC METABOLIC PANEL
ANION GAP: 12 (ref 5–15)
BUN: 9 mg/dL (ref 6–20)
CHLORIDE: 95 mmol/L — AB (ref 101–111)
CO2: 21 mmol/L — ABNORMAL LOW (ref 22–32)
Calcium: 9.3 mg/dL (ref 8.9–10.3)
Creatinine, Ser: 0.75 mg/dL (ref 0.44–1.00)
GFR calc Af Amer: >60 mL/min (ref >60)
GFR calc non Af Amer: >60 mL/min (ref >60)
Glucose, Bld: 84 mg/dL (ref 65–99)
POTASSIUM: 4.3 mmol/L (ref 3.5–5.1)
SODIUM: 128 mmol/L — AB (ref 135–145)

## 2015-06-15 LAB — HEMOGLOBIN A1C
HEMOGLOBIN A1C: 5.2 % (ref 4.8–5.6)
Mean Plasma Glucose: 103 mg/dL

## 2015-06-15 MED ORDER — ASPIRIN 325 MG PO TBEC: 325.0000 mg | DELAYED_RELEASE_TABLET | Freq: Every day | ORAL | Status: DC

## 2015-06-15 MED ORDER — ATORVASTATIN CALCIUM 20 MG PO TABS: 20.0000 mg | ORAL_TABLET | Freq: Every day | ORAL | Status: DC

## 2015-06-15 NOTE — Discharge Summary (Signed)
Physician Discharge Summary  LUGENIA MACRI [id number] DOB: [birth year] DOA: 06/13/2015  PCP: Garnet Koyanagi, DO  Admit date: 06/13/2015 Discharge date: 06/15/2015  Time spent: Less than 30 minutes  Recommendations for Outpatient Follow-up:  1. Dr. Garnet Koyanagi, PCP in 5 days with repeat labs (BMP). 2. Neurology: Outpatient follow-up was requested from the hospital and the office should call her to set up appointment. 3. Home health PT and cane  Discharge Diagnoses:  Principal Problem:   TIA (transient ischemic attack) Active Problems:   Essential hypertension   Left leg weakness   Hyponatremia   Acute ischemic stroke [facility])   Discharge Condition: Improved & Stable  Diet recommendation: Heart healthy diet.  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 In the ED, CT head was negative for acute stroke. Admitted for stroke workup. Neurology was consulted.  Hospital Course:   Possible right basal ganglia stroke/infarct with left hemiparesis - Initial neurology consultation 06/13/15: Not felt to have stroke and recommended ruling out NPH - MRI brain: Reported as negative for acute infarct. Subsequent follow-up by a second neurologist on 12/5: Haziness in the right basal ganglia which could be artifact but wonders if it represents a mostly resolved infarct. NPH would not explain her transient left-sided symptoms and hence recommend treating this event is TIA/stroke. - MRA brain: Negative  - 2-D echo: LVEF 55-60 percent - Carotid Dopplers: Bilateral: 1-39 percent ICA stenosis. Vertebral artery flow is antegrade. - LDL: 141 - A1c: 5.2  - Not candidate for  TPA due to rapid resolution of symptoms - Aspirin for secondary prophylaxis. - PT, OT and ST evaluation - Recommend home health PT, cane. No OT recommended - Neurology has been discontinued amantadine  - Neurosurgery has evaluated and are not convinced off normal pressure hydrocephalus based on her clinical presentation or her imaging findings. No neurosurgical intervention recommended.  - As per neurology follow-up, suspect that the cause of her admission this time is vascular and would continue to treat it as such. Brain atrophy on imaging is likely secondary to history of heavy alcohol use. They recommended continued aspirin and statin therapy for secondary stroke prevention. Her gait does have some features concerning for possible parkinsonism and possibly a trial of medications in the future may be reasonable - Discussed with Dr. Leonel Ramsay: She may return to work on 06/21/15 provided she feels that her left-sided symptoms have completely resolved. She indicates that she is almost close to baseline. If not, she has been advised to follow home health PT and her PCP follow-up recommendations.  Essential hypertension  - allow for permissive hypertension.  - Resumed home metoprolol and lisinopril.  Hyponatremia, chronic - Unclear etiology. Sodium levels have fluctuated from 126-135 since 2009. Stable. - Asymptomatic of same.  - Serum osmolarity 269. Urine osmolarity: 228. ? Polydipsia. ? Ongoing alcohol abuse.? Need for fluid restriction - TSH: Normal - Patient states that she does not drink excessive amounts of water. She is aware of chronically for sodium and states that she normally consumes extra salt in her diet. She is advised to follow-up with her PCP regarding further evaluation and management as deemed necessary.  Hyperlipidemia -  LDL 141, goal <70 - Not on statin prior to admission. Started Lipitor 20 MG daily.  Alcohol use,? Abuse - She states that she drinks 2 beers up to  2-3 times per week. Noted macrocytosis on CBC.? Abuse. Moderation/abstinence counseled.   Discussed at length with patient's father at bedside after patient consented.   Consultants:  Neurology  Neurosurgery  Procedures: 2-D echo: Study Conclusions  - Left ventricle: The cavity size was normal. Wall thickness was increased in a pattern of mild LVH. Systolic function was normal. The estimated ejection fraction was in the range of 55% to 60%. Wall motion was normal; there were no regional wall motion abnormalities. Features are consistent with a pseudonormal left ventricular filling pattern, with concomitant abnormal relaxation and increased filling pressure (grade 2 diastolic dysfunction). - Aortic valve: There was no stenosis. - Mitral valve: There was no significant regurgitation. - Right ventricle: The cavity size was normal. Systolic function was normal. - Pulmonary arteries: No complete TR doppler jet so unable to estimate PA systolic pressure. - Inferior vena cava: The vessel was normal in size. The respirophasic diameter changes were in the normal range (= 50%), consistent with normal central venous pressure.  Impressions:  - Normal LV size with mild LV hypertrophy. EF 55-60%. Moderate diastolic dysfunction. Normal RV size and systolic function. No significant valvular abnormalities.  Antibiotics:  None   Discharge Exam:  Complaints: Continues to feel better. States that her left-sided weakness has almost resolved. Denies any other complaints.  Filed Vitals:   06/14/15 2149 06/15/15 0114 06/15/15 0530 06/15/15 0950  BP: 138/74 129/81 144/71 144/92  Pulse: 60 63 58 84  Temp: 98.2 F (36.8 C) 98.3 F (36.8 C) 98.5 F (36.9 C) 98.5 F (36.9 C)  TempSrc: Oral Oral Oral Oral  Resp: 18 20 20 20   Height:      Weight:      SpO2: 100% 100% 100% 100%    General exam: pleasant middle-aged female lying comfortably in bed.   Respiratory system: Clear. No increased work of breathing. Cardiovascular system: S1 & S2 heard, RRR. No JVD, murmurs, gallops, clicks or pedal edema. Telemetry: SB in the 50s-SR. Gastrointestinal system: Abdomen is nondistended, soft and nontender. Normal bowel sounds heard. Central nervous system: Alert and oriented. No focal neurological deficits. Extremities: Symmetric 5 x 5 power in all limbs except left lower extremity where grade 4+ by 5 power.  Discharge Instructions      Discharge Instructions    Ambulatory referral to Neurology    Complete by:  As directed   An appointment is requested in approximately: 4 - 6 weeks     Call MD for:    Complete by:  As directed   Persistent or worsening or new stroke like symptoms.     Diet - low sodium heart healthy    Complete by:  As directed      Increase activity slowly    Complete by:  As directed             Medication List    TAKE these medications        alendronate 70 MG tablet  Commonly known as:  FOSAMAX  Take 1 tablet (70 mg total) by mouth once a week. Take with a full glass of water on an empty stomach.     aspirin 325 MG EC tablet  Take 1 tablet (325 mg total) by mouth daily.     atorvastatin 20 MG tablet  Commonly known as:  LIPITOR  Take 1 tablet (20 mg total) by mouth daily at 6 PM.     lisinopril 40 MG tablet  Commonly known as:  PRINIVIL,ZESTRIL  TAKE ONE TABLET BY MOUTH ONE TIME DAILY     metoprolol succinate 50 MG 24 hr tablet  Commonly known as:  TOPROL-XL  Take 1 tablet (50 mg total) by mouth daily. Take with or immediately following a meal.       Follow-up Information    Follow up with Garnet Koyanagi, DO. Schedule an appointment as soon as possible for a visit in 5 days.   Specialty:  Family Medicine   Why:  To be seen with repeat labs (BMP)   Contact information:   [address] [place] [address] [phone]        The results of significant diagnostics from this  hospitalization (including imaging, microbiology, ancillary and laboratory) are listed below for reference.    Significant Diagnostic Studies: Dg Chest 2 View  06/13/2015  CLINICAL DATA:  TIA EXAM: CHEST  2 VIEW COMPARISON:  06/14/2012 FINDINGS: Lungs are clear.  No pleural effusion or pneumothorax. The heart is normal in size. Mild degenerative changes of the visualized thoracolumbar spine. IMPRESSION: No evidence of acute cardiopulmonary disease. Electronically Signed   By: Julian Hy M.D.   On: 06/13/2015 18:16   Ct Head Wo Contrast  06/13/2015  CLINICAL DATA:  Left arm and left leg weakness today. Code stroke. Initial encounter. EXAM: CT HEAD WITHOUT CONTRAST TECHNIQUE: Contiguous axial images were obtained from the base of the skull through the vertex without intravenous contrast. COMPARISON:  Head CT 05/26/2004. FINDINGS: There is no evidence of acute intracranial hemorrhage, mass lesion, brain edema or extra-axial fluid collection. The ventricles and subarachnoid spaces are prominent for age and mildly progressive. There is no CT evidence of acute cortical infarction. There is mildly progressive periventricular white matter disease, likely due to chronic small vessel ischemic changes. There is some encephalomalacia in the left parietal lobe. The visualized paranasal sinuses, mastoid air cells and middle ears are clear. The calvarium is intact. IMPRESSION: Progressive atrophy and chronic small vessel ischemic changes. No CT evidence of acute stroke or hemorrhage. These results were called by telephone at the time of interpretation on 06/13/2015 at 2:56 pm to Lynwood Dawley, PA for stroke team, who verbally acknowledged these results. Electronically Signed   By: Richardean Sale M.D.   On: 06/13/2015 15:00   Mr Brain Wo Contrast  06/13/2015  CLINICAL DATA:  TIA. Hypertension. Left-sided weakness and facial droop EXAM: MRI HEAD WITHOUT CONTRAST MRA HEAD WITHOUT CONTRAST TECHNIQUE: Multiplanar,  multiecho pulse sequences of the brain and surrounding structures were obtained without intravenous contrast. Angiographic images of the head were obtained using MRA technique without contrast. COMPARISON:  CT head 06/13/2015 FINDINGS: MRI HEAD FINDINGS Negative for acute infarct. Moderate atrophy.  Ventricular enlargement consistent with atrophy. Mild chronic microvascular ischemic change in the white matter and pons. Negative for hemorrhage or mass Pituitary normal in size. Normal orbit bilaterally. Mild mucosal edema in the paranasal sinuses without air-fluid level. MRA HEAD FINDINGS Left vertebral artery dominant and patent to the basilar. Left PICA patent. Right vertebral artery nondominant and ends in PICA. Basilar patent. Fetal origin right posterior cerebral artery. Hypoplastic right P1 segment. Posterior cerebral arteries are patent bilaterally. Left posterior communicating artery is patent. Internal carotid artery is patent bilaterally without stenosis or aneurysm. Anterior and middle cerebral arteries patent bilaterally without stenosis or aneurysm.  IMPRESSION: Atrophy and mild chronic microvascular ischemia Negative for acute infarct Negative MRA of the head Electronically Signed   By: Franchot Gallo M.D.   On: 06/13/2015 19:52   Mr Jodene Nam Head/brain Wo Cm  06/13/2015  CLINICAL DATA:  TIA. Hypertension. Left-sided weakness and facial droop EXAM: MRI HEAD WITHOUT CONTRAST MRA HEAD WITHOUT CONTRAST TECHNIQUE: Multiplanar, multiecho pulse sequences of the brain and surrounding structures were obtained without intravenous contrast. Angiographic images of the head were obtained using MRA technique without contrast. COMPARISON:  CT head 06/13/2015 FINDINGS: MRI HEAD FINDINGS Negative for acute infarct. Moderate atrophy.  Ventricular enlargement consistent with atrophy. Mild chronic microvascular ischemic change in the white matter and pons. Negative for hemorrhage or mass Pituitary normal in size. Normal orbit  bilaterally. Mild mucosal edema in the paranasal sinuses without air-fluid level. MRA HEAD FINDINGS Left vertebral artery dominant and patent to the basilar. Left PICA patent. Right vertebral artery nondominant and ends in PICA. Basilar patent. Fetal origin right posterior cerebral artery. Hypoplastic right P1 segment. Posterior cerebral arteries are patent bilaterally. Left posterior communicating artery is patent. Internal carotid artery is patent bilaterally without stenosis or aneurysm. Anterior and middle cerebral arteries patent bilaterally without stenosis or aneurysm. IMPRESSION: Atrophy and mild chronic microvascular ischemia Negative for acute infarct Negative MRA of the head Electronically Signed   By: Franchot Gallo M.D.   On: 06/13/2015 19:52    Microbiology: No results found for this or any previous visit (from the past 240 hour(s)).   Labs: Basic Metabolic Panel:  Recent Labs Lab 06/13/15 1433 06/13/15 1438 06/14/15 1700 06/15/15 0750  NA 127* 129* 129* 128*  K 4.0 3.9 4.0 4.3  CL 94* 94* 99* 95*  CO2 23  --  22 21*  GLUCOSE 117* 116* 122* 84  BUN 9 8 25* 9  CREATININE 0.80 0.80 0.84 0.75  CALCIUM 9.5  --  9.0 9.3   Liver Function Tests:  Recent Labs Lab 06/13/15 1433  AST 43*  ALT 26  ALKPHOS 66  BILITOT 0.8  PROT 7.3  ALBUMIN 4.4   No results for input(s): LIPASE, AMYLASE in the last 168 hours. No results for input(s): AMMONIA in the last 168 hours. CBC:  Recent Labs Lab 06/13/15 1433 06/13/15 1438  WBC 7.4  --   NEUTROABS 5.0  --   HGB 13.8 16.0*  HCT 39.8 47.0*  MCV 102.8*  --   PLT 267  --    Cardiac Enzymes: No results for input(s): CKTOTAL, CKMB, CKMBINDEX, TROPONINI in the last 168 hours. BNP: BNP (last 3 results) No results for input(s): BNP in the last 8760 hours.  ProBNP (last 3 results) No results for input(s): PROBNP in the last 8760 hours.  CBG: No results for input(s): GLUCAP in the last 168  hours.      Signed:  Vernell Leep, MD, FACP, FHM. [facility] Pager [phone]  If 7PM-7AM, please contact night-coverage [email or web address] Password [email or web address] 06/15/2015, 12:28 PM

## 2015-06-15 NOTE — Discharge Instructions (Signed)
Stroke Prevention Some medical conditions and behaviors are associated with an increased chance of having a stroke. You may prevent a stroke by making healthy choices and managing medical conditions. HOW CAN I REDUCE MY RISK OF HAVING A STROKE?   Stay physically active. Get at least 30 minutes of activity on most or all days.  Do not smoke. It may also be helpful to avoid exposure to secondhand smoke.  Limit alcohol use. Moderate alcohol use is considered to be:  No more than 2 drinks per day for men.  No more than 1 drink per day for nonpregnant women.  Eat healthy foods. This involves:  Eating 5 or more servings of fruits and vegetables a day.  Making dietary changes that address high blood pressure (hypertension), high cholesterol, diabetes, or obesity.  Manage your cholesterol levels.  Making food choices that are high in fiber and low in saturated fat, trans fat, and cholesterol may control cholesterol levels.  Take any prescribed medicines to control cholesterol as directed by your health care provider.  Manage your diabetes.  Controlling your carbohydrate and sugar intake is recommended to manage diabetes.  Take any prescribed medicines to control diabetes as directed by your health care provider.  Control your hypertension.  Making food choices that are low in salt (sodium), saturated fat, trans fat, and cholesterol is recommended to manage hypertension.  Ask your health care provider if you need treatment to lower your blood pressure. Take any prescribed medicines to control hypertension as directed by your health care provider.  If you are 75-81 years of age, have your blood pressure checked every 3-5 years. If you are 35 years of age or older, have your blood pressure checked every year.  Maintain a healthy weight.  Reducing calorie intake and making food choices that are low in sodium, saturated fat, trans fat, and cholesterol are recommended to manage  weight.  Stop drug abuse.  Avoid taking birth control pills.  Talk to your health care provider about the risks of taking birth control pills if you are over 67 years old, smoke, get migraines, or have ever had a blood clot.  Get evaluated for sleep disorders (sleep apnea).  Talk to your health care provider about getting a sleep evaluation if you snore a lot or have excessive sleepiness.  Take medicines only as directed by your health care provider.  For some people, aspirin or blood thinners (anticoagulants) are helpful in reducing the risk of forming abnormal blood clots that can lead to stroke. If you have the irregular heart rhythm of atrial fibrillation, you should be on a blood thinner unless there is a good reason you cannot take them.  Understand all your medicine instructions.  Make sure that other conditions (such as anemia or atherosclerosis) are addressed. SEEK IMMEDIATE MEDICAL CARE IF:   You have sudden weakness or numbness of the face, arm, or leg, especially on one side of the body.  Your face or eyelid droops to one side.  You have sudden confusion.  You have trouble speaking (aphasia) or understanding.  You have sudden trouble seeing in one or both eyes.  You have sudden trouble walking.  You have dizziness.  You have a loss of balance or coordination.  You have a sudden, severe headache with no known cause.  You have new chest pain or an irregular heartbeat. Any of these symptoms may represent a serious problem that is an emergency. Do not wait to see if the symptoms will  go away. Get medical help at once. Call your local emergency services (911 in [place] Do not drive yourself to the hospital.   This information is not intended to replace advice given to you by your health care provider. Make sure you discuss any questions you have with your health care provider.   Document Released: 08/03/2004 Document Revised: 07/17/2014 Document Reviewed:  12/27/2012 Elsevier Interactive Patient Education 2016 [employer].  Hyponatremia Hyponatremia is when the amount of salt (sodium) in your blood is too low. When sodium levels are low, your cells absorb extra water and they swell. The swelling happens throughout the body, but it mostly affects the brain. CAUSES This condition may be caused by:  Heart, kidney, or liver problems.  Thyroid problems.  Adrenal gland problems.  Metabolic conditions, such as syndrome of inappropriate antidiuretic hormone (SIADH).  Severe vomiting and diarrhea.  Certain medicines or illegal drugs.  Dehydration.  Drinking too much water.  Eating a diet that is low in sodium.  Large burns on your body.  Sweating. RISK FACTORS This condition is more likely to develop in people who:  Have long-term (chronic) kidney disease.  Have heart failure.  Have a medical condition that causes frequent or excessive diarrhea.  Have metabolic conditions, such as Addison disease or SIADH.  Take certain medicines that affect the sodium and fluid balance in the blood. Some of these medicine types include:  Diuretics.  NSAIDs.  Some opioid pain medicines.  Some antidepressants.  Some seizure prevention medicines. SYMPTOMS  Symptoms of this condition include:  Nausea and vomiting.  Confusion.  Lethargy.  Agitation.  Headache.  Seizures.  Unconsciousness.  Appetite loss.  Muscle weakness and cramping.  Feeling weak or light-headed.  Having a rapid heart rate.  Fainting, in severe cases. DIAGNOSIS This condition is diagnosed with a medical history and physical exam. You will also have other tests, including:  Blood tests.  Urine tests. TREATMENT Treatment for this condition depends on the cause. Treatment may include:  Fluids given through an IV tube that is inserted into one of your veins.  Medicines to correct the sodium imbalance. If medicines are causing the condition, the  medicines will need to be adjusted.  Limiting water or fluid intake to get the correct sodium balance. HOME CARE INSTRUCTIONS  Take medicines only as directed by your health care provider. Many medicines can make this condition worse. Talk with your health care provider about any medicines that you are currently taking.  Carefully follow a recommended diet as directed by your health care provider.  Carefully follow instructions from your health care provider about fluid restrictions.  Keep all follow-up visits as directed by your health care provider. This is important.  Do not drink alcohol. SEEK MEDICAL CARE IF:  You develop worsening nausea, fatigue, headache, confusion, or weakness.  Your symptoms go away and then return.  You have problems following the recommended diet. SEEK IMMEDIATE MEDICAL CARE IF:  You have a seizure.  You faint.  You have ongoing diarrhea or vomiting.   This information is not intended to replace advice given to you by your health care provider. Make sure you discuss any questions you have with your health care provider.   Document Released: 06/16/2002 Document Revised: 11/10/2014 Document Reviewed: 07/16/2014 Elsevier Interactive Patient Education [employer].

## 2015-06-15 NOTE — Care Management Note (Signed)
Case Management Note  Patient Details  Name: [name] MRN: [id number] Date of Birth: [birth year]  Subjective/Objective:                    Action/Plan: Patient discharging home today with home health orders. CM met with the patient and provided her a list of [facility] agencies in the [place] area. Patient selected [facility]. Karolee Stamps with [facility] notified and accepted the referral. Patient also ordered a cane for home use. Jermaine with [facility] notified and going to deliver the cane to the room. Bedside RN updated.   Expected Discharge Date:                  Expected Discharge Plan:  [place]  In-House Referral:     Discharge planning Services  CM Consult  Post Acute Care Choice:  Home Health, Durable Medical Equipment Choice offered to:  Patient  DME Arranged:  [facility]sen DME Agency:  [place] Arranged:  PT [place]:  [place]  Status of Service:  Completed, signed off  Medicare Important Message Given:    Date Medicare IM Given:    Medicare IM give by:    Date Additional Medicare IM Given:    Additional Medicare Important Message give by:     If discussed at [place] of Stay Meetings, dates discussed:    Additional Comments:  Pollie Friar, RN 06/15/2015, 2:12 PM

## 2015-06-15 NOTE — Progress Notes (Addendum)
Subjective: Feels great today with no weakness.   Exam: Filed Vitals:   06/15/15 0114 06/15/15 0530  BP: 129/81 144/71  Pulse: 63 58  Temp: 98.3 F (36.8 C) 98.5 F (36.9 C)  Resp: 20 20    HEENT-  Normocephalic, no lesions, without obvious abnormality.  Normal external eye and conjunctiva.  Normal TM's bilaterally.  Normal auditory canals and external ears. Normal external nose, mucus membranes and septum.  Normal pharynx. Cardiovascular- S1, S2 normal, pulses palpable throughout   Lungs- chest clear, no wheezing, rales, normal symmetric air entry Abdomen- normal findings: bowel sounds normal Extremities- no edema     Gen: In bed, NAD MS: alert and oriented, follows commands, speech clear CN: grossly intact with slight left NL fold decrease Motor: 5/5 throughout Sensory: intact to LT   Pertinent Labs: none  Carotid doppler and Echo WNL and unrevealing   Etta Quill PA-C [facility] [phone]  Impression: As stated prior "memory difficulty or urinary incontenince prior to yesterday and we are  hesitant to pursue an NPH workup acutely. I would favor letting her recover from this acute event and then if she continues to have gait difficulty having an LP with PT evaluation pursued at that time."   Recommendations: 1) Continue ASA and Statin therapy 2) Follow up outpatient neurology   Neurology S/O  06/15/2015, 8:41 AM  I have requested outpatient follow up and the office should call her to set up appointment. Her gait does have some features concerning for possible parkinsonism and possibly a trial of medication in the future may be reasonable.  I suspect that the cause of her admission this time, however, is vascular and would continue to treat it as such.   Her atrophy is likely secondary to a history(not current) of heavy etoh use.   I would continue asa and statin therapy for secondary stroke prevention, PT as recommended.   Roland Rack, MD [facility] [phone]  If 7pm- 7am, please page neurology on call as listed in [place].

## 2015-06-15 NOTE — Telephone Encounter (Signed)
Please contact pt to arrange hospital follow up in 5 days and complete transitional care call.

## 2015-06-15 NOTE — Progress Notes (Signed)
Discharge orders received, Pt for discharge home today with home health PT . IV d/c'd. D/c instructions and RX given with verbalized understanding. Family at bedside to assist patient with discharge. Staff bought pt downstairs via wheelchair. 06/15/15 1404

## 2015-06-15 NOTE — Evaluation (Signed)
Occupational Therapy Evaluation Patient Details Name: [name] MRN: [id number] DOB: October 10, 1955 Today's Date: 06/15/2015    History of Present Illness Pt is an active 59 y/o female who has a PMH significant for HTN. Pt presents after an episode of L sided weakness including facial weakness. Neurosurgery was consulted and per MD MRI and CT are most consistent with ventriculomegaly ex vacuo.   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  Presents with impaired balance requiring supervision for safety without a device.  No weakness, sensory or vision changes noted.  Pt uses compensatory strategies for memory at baseline.  Educated pt at length in safety related to her impaired balance. Agree with plan for post acute PT to address balance prior to pt returning to work and living independently. No further OT needs.    Follow Up Recommendations  No OT follow up    Equipment Recommendations  None recommended by OT    Recommendations for Other Services       Precautions / Restrictions Precautions Precautions: Fall Restrictions Weight Bearing Restrictions: No      Mobility Bed Mobility Overal bed mobility: Modified Independent                Transfers Overall transfer level: Needs assistance Equipment used: None   Sit to Stand: Supervision              Balance   Sitting-balance support: Feet supported Sitting balance-Leahy Scale: Good       Standing balance-Leahy Scale: Fair                              ADL Overall ADL's : Needs assistance/impaired Eating/Feeding: Independent;Sitting   Grooming: Wash/dry hands;Standing;Supervision/safety   Upper Body Bathing: Supervision/ safety;Standing   Lower Body Bathing: Supervison/ safety;Sit to/from stand   Upper Body Dressing : Supervision/safety;Standing   Lower Body Dressing: Supervision/safety;Sitting/lateral leans   Toilet Transfer:  Supervision/safety;Ambulation   Toileting- Clothing Manipulation and Hygiene: Supervision/safety;Sit to/from stand   Tub/ Shower Transfer: Supervision/safety;Ambulation;Tub transfer   Functional mobility during ADLs: Supervision/safety       Vision     Perception     Praxis      Pertinent Vitals/Pain Pain Assessment: No/denies pain     Hand Dominance Left   Extremity/Trunk Assessment Upper Extremity Assessment Upper Extremity Assessment: Overall WFL for tasks assessed   Lower Extremity Assessment Lower Extremity Assessment: Defer to PT evaluation   Cervical / Trunk Assessment Cervical / Trunk Assessment: Normal   Communication Communication Communication: No difficulties   Cognition Arousal/Alertness: Awake/alert Behavior During Therapy: WFL for tasks assessed/performed Overall Cognitive Status: Within Functional Limits for tasks assessed       Memory: Decreased short-term memory (at baseline, uses compensatory strategies)             General Comments       Exercises       Shoulder Instructions      Home Living Family/patient expects to be discharged to:: Private residence Living Arrangements: Alone Available Help at Discharge: Family;Available 24 hours/day Type of Home: House Home Access: Level entry     Home Layout: Two level Alternate Level Stairs-Number of Steps: Flight Alternate Level Stairs-Rails: Right;Left;Can reach both Bathroom Shower/Tub: Teacher, early years/pre: Standard     Home Equipment: Environmental consultant - 2 wheels;Cane - single point   Additional Comments: Pt plans to stay with  her father initially.  Pt is a Barrister's clerk.      Prior Functioning/Environment Level of Independence: Independent             OT Diagnosis: Generalized weakness   OT Problem List:     OT Treatment/Interventions:      OT Goals(Current goals can be found in the care plan section) Acute Rehab OT Goals Patient Stated Goal: return to  work  OT Frequency:     Barriers to D/C:            Co-evaluation              End of Session    Activity Tolerance: Patient tolerated treatment well Patient left: in bed;with call bell/phone within reach;with bed alarm set   Time: 1004-1027 OT Time Calculation (min): 23 min Charges:  OT General Charges $OT Visit: 1 Procedure OT Evaluation $Initial OT Evaluation Tier I: 1 Procedure OT Treatments $Self Care/Home Management : 8-22 mins G-Codes:    Malka So 06/15/2015, 10:34 AM  [phone]

## 2015-06-16 ENCOUNTER — Telehealth: Payer: Self-pay

## 2015-06-16 DIAGNOSIS — G459 Transient cerebral ischemic attack, unspecified: Secondary | ICD-10-CM

## 2015-06-16 DIAGNOSIS — R29898 Other symptoms and signs involving the musculoskeletal system: Secondary | ICD-10-CM

## 2015-06-16 DIAGNOSIS — G20 Parkinson's disease: Secondary | ICD-10-CM

## 2015-06-16 NOTE — Telephone Encounter (Signed)
Please see telephone note on 06/16/15 for TCM/Hospital Follow-up call.

## 2015-06-16 NOTE — Telephone Encounter (Signed)
Noted routed to Idelle Crouch, Therapist, sports.

## 2015-06-16 NOTE — Telephone Encounter (Signed)
Transition Care Management Follow-up Telephone Call  ADMISSION DATE: 06/13/15  DISCHARGE DATE: 06/15/15 Reason for Admission: TIA  How have you been since you were released from the hospital? Patient states she feels almost back to normal.Left leg feels a little weak.  Do you understand why you were in the hospital? YES  Do you understand the discharge instrcutions? Yes  Itemsa Reviewed:  Medications reviewed: Medications reviewed with patient  Allergies reviewed: Yes Codiene  Referrals reviewed: Dr. Etter Sjogren for Hospital Follow Up,Neurology ,Home Health for PT and [place]. Patient states she needs referral from Dr. Etter Sjogren for these.       Functional Questionnaire:   Activities of Daily Living (ADLs):  No help needed at this time  Any transportation issues/concerns?: No   Any patient concerns? None noted  Confirmed importance and date/time of follow-up visits scheduled: Yes  Confirmed with patient if condition begins to worsen call PCP or go to the ER. Yes   Patient was given the [place] line [phone]: Yes

## 2015-06-17 ENCOUNTER — Telehealth: Payer: Self-pay | Admitting: Family Medicine

## 2015-06-17 ENCOUNTER — Telehealth: Payer: Self-pay | Admitting: Internal Medicine

## 2015-06-17 NOTE — Telephone Encounter (Signed)
[facility] - Client TELEPHONE ADVICE RECORD   [facility]     Patient Name: [name] Client [facility] - Client    Client Site [facility] Day    Physician Garnet Koyanagi     Contact Type Call  Gender: Female Call Type Triage / Clinical  DOB: 02/14/1956  Relationship To Patient Self  Age: 59 Y 43 M 10 D Return Phone Number [phone] (Primary)  Return Phone Number: [phone] (Primary) Chief Complaint BLOOD PRESSURE LOW - Systolic (top number) 90 or less with dizzy or weak symptoms  Address:  Initial Comment Caller states mild TIA recently. Just got out of hospital 2 days ago. BP still not going down. BP 197/109 today. On meds for BP. Wanting to switch since not helping. Not having current symptoms though.  City/State/Zip: [place]  PreDisposition Call Doctor    Nurse Assessment  Nurse: Amalia Hailey, RN, Melissa Date/Time (Eastern Time): 06/17/2015 3:57:34 PM  Confirm and document reason for call. If symptomatic, describe symptoms. ---Caller states mild TIA recently. Just got out of hospital 2 days ago. BP still not going down. BP 197/109 today. On meds for BP. Wanting to switch since not helping. Not having current symptoms though.  Has the patient traveled out of the country within the last 30 days? ---Not Applicable  Does the patient have any new or worsening symptoms? ---Yes  Will a triage be completed? ---Yes  Related visit to physician within the last 2 weeks? ---Yes  Does the PT have any chronic conditions? (i.e. diabetes, asthma, etc.) ---Yes  List chronic conditions. ---hypertension, TIA, Losinopril 40 mg daily, Metoprolol Succ. 50 mg daily, high cholesterol, vitamin, coated ASA 325 mg  Is this a behavioral health or substance abuse call? ---No    Guidelines      Guideline Title Affirmed Question Affirmed Notes Nurse Date/Time (Eastern Time)  High Blood Pressure [1] BP ? 140/90 AND [2]  taking BP medications  Evans, RN, Melissa 06/17/2015 4:02:49 PM  Disp. Time Eilene Ghazi Time) Disposition Final User   06/17/2015 3:53:38 PM Send to Urgent Queue  May, Mandy   06/17/2015 4:11:06 PM Paged On Call back to Call [place], [place], Lenna Sciara   06/17/2015 4:29:23 PM Send To RN Personal  Amalia Hailey, RN, Melissa   06/17/2015 4:06:44 PM See PCP within 2 Weeks Yes Amalia Hailey, RN, Emelia [place] Understands: Yes   Disagree/Comply: Comply      Care Advice Given Per Guideline         SEE PCP WITHIN 2 WEEKS: You need an evaluation for this ongoing problem within the next 2 weeks. Call your doctor during regular office hours and make an appointment. REASSURANCE: * Your blood pressure is elevated but you have told me that you are not having any symptoms. * You might need to have an adjustment in your medication(s). * You become worse. CALL BACK IF: * Difficulty walking, difficulty talking, or severe headache occurs * Weakness or numbness of the face, arm or leg on one side of the body occurs * Your blood pressure is over 160/100 * Chest pain or difficulty breathing occurs * DECREASE SODIUM INTAKE: Aim to eat less than 2.4 g (100 mmol) of sodium each day. Unfortunately 75% of the salt in the average person's diet is in pre-processed foods. LIFESTYLE MODIFICATIONS - The following things can help you reduce your blood  pressure: * REDUCE STRESS: Find activities that help reduce your stress. Examples might include meditation, yoga, or even a restful walk in a park. * REDUCE WEIGHT AND WAIST LINE: It is important to maintain a normal body weight. The goal should be a BMI (body mass index) under 25 for men and women, a waist circumference under 40 inches (102 cm) in men, and a waist circumference under 35 inches (88 cm) in women. * EXERCISE, BE MORE PHYSICALLY ACTIVE: Do at least 30 minutes of aerobic exercise (e.g., brisk walking) most days of the week. Other examples of aerobic activities cycling, jogging, and  swimming. * LIMIT ALCOHOL: Limit alcohol to 0-2 standard drinks each day. Men should have less than 14 dinks per week. Women should have less than 9 drinks per week. A drink is 1.5 oz hard liquor (one shot or jigger; 45 ml), 5 oz wine (small glass; 150 ml), 12 oz beer (one can; 360 ml). * EAT HEALTHY: Eat a diet rich in fresh fruits and vegetables, dietary fiber, non-animal protein (e.g., soy), and low-fat dairy products. Avoid foods with a high content of saturated fat or cholesterol. * The goal of blood pressure treatment for most people with hypertension is to keep the blood pressure under 140/90. For people that are 60 years or older, your doctor may instead want to keep the blood pressure under 150/90.   After Care Instructions Given     Call Event Type User Date / Time Description               Referrals   REFERRED TO PCP OFFICE   Paging       DoctorName Phone DateTime Result/Outcome Message Type Notes  Cathlean Cower [id number] 06/17/2015 4:11:06 PM Called On Call Provider - Left Message Doctor Paged   Cathlean Cower  06/17/2015 4:28:57 PM Spoke with On Call - General Message Result informed of the pt BP readins of 187/109 this AM and 2 hrs ago 177/94, had TIA 3 days ago and was discharged from the hospital 2 days ago. Caller is concerned about her blood pressure and if a medication adjustment is needed, No symptoms. Recommended that the caller be seen in the office tomorrow to evaluate this BP. Caller was called back but voice mail picked up, left a message, will continue to attempt contact.

## 2015-06-17 NOTE — Telephone Encounter (Signed)
Called per [facility] on call nurse  Pt with SBP just over 180 twice today, asymptomatic, just 2 days post d/c for TIA, on metoprolol 50 qd, and lsinopril 40 qd  Chart review indicates HR 58 and 63 just prior to d/c  ACE is highest dose, and I would not feel comfortable with extra BB tonight due to HR  OK to cont to allow for some permissive HTN in this case but for short term only, and should be seen per PCP tomorrow  Will route to PCP

## 2015-06-18 ENCOUNTER — Telehealth: Payer: Self-pay | Admitting: Medical

## 2015-06-18 ENCOUNTER — Encounter: Payer: Self-pay | Admitting: Medical

## 2015-06-18 ENCOUNTER — Ambulatory Visit (INDEPENDENT_AMBULATORY_CARE_PROVIDER_SITE_OTHER): Payer: BLUE CROSS/BLUE SHIELD | Admitting: Medical

## 2015-06-18 ENCOUNTER — Ambulatory Visit: Payer: Self-pay | Admitting: Internal Medicine

## 2015-06-18 ENCOUNTER — Ambulatory Visit (HOSPITAL_BASED_OUTPATIENT_CLINIC_OR_DEPARTMENT_OTHER)
Admission: RE | Admit: 2015-06-18 | Discharge: 2015-06-18 | Disposition: A | Discharge status: Home / Self Care | Payer: BLUE CROSS/BLUE SHIELD | Source: Ambulatory Visit | Attending: Medical | Admitting: Medical

## 2015-06-18 VITALS — BP 155/80 | HR 62 | Temp 98.2°F | Ht 61.0 in | Wt 113.8 lb

## 2015-06-18 DIAGNOSIS — R269 Unspecified abnormalities of gait and mobility: Secondary | ICD-10-CM

## 2015-06-18 DIAGNOSIS — I10 Essential (primary) hypertension: Secondary | ICD-10-CM

## 2015-06-18 DIAGNOSIS — Z8673 Personal history of transient ischemic attack (TIA), and cerebral infarction without residual deficits: Secondary | ICD-10-CM

## 2015-06-18 DIAGNOSIS — E871 Hypo-osmolality and hyponatremia: Secondary | ICD-10-CM | POA: Diagnosis not present

## 2015-06-18 DIAGNOSIS — G319 Degenerative disease of nervous system, unspecified: Secondary | ICD-10-CM | POA: Diagnosis not present

## 2015-06-18 LAB — CBC WITH DIFFERENTIAL/PLATELET
BASOS PCT: 0.3 % (ref 0.0–3.0)
Basophils Absolute: 0 10*3/uL (ref 0.0–0.1)
EOS ABS: 0.1 10*3/uL (ref 0.0–0.7)
EOS PCT: 0.7 % (ref 0.0–5.0)
HEMATOCRIT: 42.8 % (ref 36.0–46.0)
HEMOGLOBIN: 14.6 g/dL (ref 12.0–15.0)
LYMPHS PCT: 12.1 % (ref 12.0–46.0)
Lymphs Abs: 1.2 10*3/uL (ref 0.7–4.0)
MCHC: 34 g/dL (ref 30.0–36.0)
MCV: 104.4 fl — ABNORMAL HIGH (ref 78.0–100.0)
Monocytes Absolute: 1.2 10*3/uL — ABNORMAL HIGH (ref 0.1–1.0)
Monocytes Relative: 11.8 % (ref 3.0–12.0)
NEUTROS ABS: 7.5 10*3/uL (ref 1.4–7.7)
Neutrophils Relative %: 75.1 % (ref 43.0–77.0)
PLATELETS: 290 10*3/uL (ref 150.0–400.0)
RBC: 4.1 Mil/uL (ref 3.87–5.11)
RDW: 12.5 % (ref 11.5–15.5)
WBC: 9.9 10*3/uL (ref 4.0–10.5)

## 2015-06-18 LAB — COMPREHENSIVE METABOLIC PANEL
ALBUMIN: 4.7 g/dL (ref 3.5–5.2)
ALT: 31 U/L (ref 0–35)
AST: 35 U/L (ref 0–37)
Alkaline Phosphatase: 76 U/L (ref 39–117)
BUN: 7 mg/dL (ref 6–23)
CALCIUM: 10.4 mg/dL (ref 8.4–10.5)
CHLORIDE: 86 meq/L — AB (ref 96–112)
CO2: 28 meq/L (ref 19–32)
CREATININE: 0.71 mg/dL (ref 0.40–1.20)
GFR: 89.72 mL/min (ref >60.00)
Glucose, Bld: 100 mg/dL — ABNORMAL HIGH (ref 70–99)
POTASSIUM: 4.4 meq/L (ref 3.5–5.1)
Sodium: 122 mEq/L — ABNORMAL LOW (ref 135–145)
Total Bilirubin: 1 mg/dL (ref 0.2–1.2)
Total Protein: 7.6 g/dL (ref 6.0–8.3)

## 2015-06-18 MED ORDER — AMLODIPINE BESYLATE 10 MG PO TABS: 10.0000 mg | ORAL_TABLET | Freq: Every day | ORAL | Status: DC

## 2015-06-18 NOTE — Progress Notes (Signed)
Pre visit review using our clinic review tool, if applicable. No additional management support is needed unless otherwise documented below in the visit note. 

## 2015-06-18 NOTE — Telephone Encounter (Signed)
Patient in for office visit today.  

## 2015-06-18 NOTE — Telephone Encounter (Signed)
error 

## 2015-06-18 NOTE — Patient Instructions (Addendum)
With recent high blood pressure and history of tia and minimal gait disturbance I do think it is best to get ct of the head stat without contrast.   I want you to continue lisinopril 40 mg a day and metoprolol 50mg  a day. I want you to start amlodipine tomorrow am.   With recent TIA this put you at risk of getting recurrent tia or stroke. So if bp spiking again despite med changes or neurologic signs symptoms/signs as discussed  then got to main ED over weekend since you may need ct of head and possibly.   Will get cbc and cmp stat.  Follow up on Monday or ED this weekend as explained above.

## 2015-06-18 NOTE — Progress Notes (Signed)
Subjective:    Patient ID: [name], female    DOB: 27-Jan-1956, 59 y.o.   MRN: [id number]  HPI  Pt it with very high blood pressure. Tells me she had TIA on Sunday. Discharged from hospital and since then her bp at hom have been 214/119 on 06-13-2015. Then since 06-15-2015  179/109, 158/98, 149/94, 215/104, 189/103,177/94, 163/97, 152/88, 206/102.  Pt is on lisinopril 40 mg a day. Pt is metoprolol 50 mg q day. Pt took extra 1/2 dosage extra of medication on both the 7th and the 8th.  Also some low sodium as well.  See ros neuro  Pt on Sunday had some numbness to her left leg. Pt states she felt like foot glued to the floor. Leg was weak. Pt states that even after she left hospital gait felt little off. By wed she felt like leg was normal.   Pt took extra bp medication this am as well.(took  doses around 7) am. She took extra 10 mg of lisinopril today. And extra 25 mg metoprolol.    Review of Systems  Constitutional: Negative for fever, chills and fatigue.  Respiratory: Negative for chest tightness and shortness of breath.   Cardiovascular: Negative for chest pain and palpitations.  Musculoskeletal: Negative for back pain.  Neurological: Negative for dizziness, tremors, syncope, facial asymmetry, speech difficulty, weakness, light-headedness, numbness and headaches.  Psychiatric/Behavioral: Negative for behavioral problems, confusion and sleep disturbance. The patient is not nervous/anxious.     Past Medical History  Diagnosis Date  . Hypertension   . Anxiety   . Depression   . Substance abuse   . Cancer ([place]) 04/2014    squamous cell carcinoma L inner thigh    Social History   Social History  . Marital Status: Divorced    Spouse Name: N/A  . Number of Children: N/A  . Years of Education: N/A   Occupational History  . good [employer]    Social History Main Topics  . Smoking status: Never Smoker   . Smokeless tobacco: Never Used  . Alcohol Use: No     Comment:  rare-- 2 x a month  . Drug Use: No  . Sexual Activity:    Partners: Male   Other Topics Concern  . Not on file   Social History Narrative   Exercise--- gym 6x a week    Past Surgical History  Procedure Laterality Date  . Nose surgery      Epistaxis    Family History  Problem Relation Age of Onset  . Hypertension Mother   . Kidney disease Mother     dialysis  . Alzheimer's disease Mother   . Hyperlipidemia Father   . Stroke Paternal Grandmother     Allergies  Allergen Reactions  . Codeine     REACTION: insomnia and anxious    Current Outpatient Prescriptions on File Prior to Visit  Medication Sig Dispense Refill  . alendronate (FOSAMAX) 70 MG tablet Take 1 tablet (70 mg total) by mouth once a week. Take with a full glass of water on an empty stomach. 4 tablet 11  . aspirin EC 325 MG EC tablet Take 1 tablet (325 mg total) by mouth daily. 30 tablet 0  . atorvastatin (LIPITOR) 20 MG tablet Take 1 tablet (20 mg total) by mouth daily at 6 PM. 30 tablet 0  . lisinopril (PRINIVIL,ZESTRIL) 40 MG tablet TAKE ONE TABLET BY MOUTH ONE TIME DAILY 90 tablet 0  . metoprolol succinate (TOPROL-XL) 50 MG  24 hr tablet Take 1 tablet (50 mg total) by mouth daily. Take with or immediately following a meal. 90 tablet 3   No current facility-administered medications on file prior to visit.    BP 204/101 mmHg  Pulse 62  Temp(Src) 98.2 F (36.8 C) (Oral)  Ht 5\' 1"  (1.549 m)  Wt 113 lb 12.8 oz (51.619 kg)  BMI 21.51 kg/m2  SpO2 100%       Objective:   Physical Exam  General Mental Status- Alert. General Appearance- Not in acute distress.   Skin General: Color- Normal Color. Moisture- Normal Moisture.  Neck Carotid Arteries- Normal color. Moisture- Normal Moisture. No carotid bruits. No JVD.  Chest and Lung Exam Auscultation: Breath Sounds:-Normal.  Cardiovascular Auscultation:Rythm- Regular. Murmurs & Other Heart Sounds:Auscultation of the heart reveals- No  Murmurs.  Abdomen Inspection:-Inspeection Normal. Palpation/Percussion:Note:No mass. Palpation and Percussion of the abdomen reveal- Non Tender, Non Distended + BS, no rebound or guarding.   Neurologic Cranial Nerve exam:- CN III-XII intact(No nystagmus), symmetric smile. Drift Test:- No drift. Romberg Exam:- Negative.  Heal to Toe Gait exam:-Appears off balance with rhomberg little/slight. Finger to Nose:- Normal/Intact Strength:- 5/5 equal and symmetric strength both upper and lower extremities.(Particularly lt left lower ext strength feel normal)      Assessment & Plan:  With recent high blood pressure and history of tia and minimal gait disturbance I do think it is best to get ct of the head stat without contrast.   I want you to continue lisinopril 40 mg a day and metoprolol 50mg  a day. I want you to start amlodipine tomorrow am.   With recent TIA this put you at risk of getting recurrent tia or stroke. So if bp spiking again despite med changes or neurologic signs symptoms/signs as discussed  then got to main ED over weekend since you may need ct of head and possibly.   Will get cbc and cmp stat.  Follow up on Monday or ED this weekend as explained above  Note pt extra doses at 7 am seems to be bringing bp down with my last check. She does clarify that she thinks her gait is getting better since discharge. This is my first time seeing her.

## 2015-06-18 NOTE — Telephone Encounter (Signed)
I called pt and felt like with na of 122 had to leave message. I advised her since 6 point drop in na best is for her to be seen in ED. Explained lethargy confusion can occur(low na can be dangerous). Pt has dropped 6 points. I cautioned her not be overhydrating with water. I advised her that I would try to call her and speak directly to her around 5 pm before leave for the weekend. In event she does not go to ED then I asked her to at least come by our office and get repeat cmp on Monday to  make sure na is not dropping further. Will send this message to [facility] as reminder that if she does not drop by on Monday to call [place].

## 2015-06-21 ENCOUNTER — Ambulatory Visit (INDEPENDENT_AMBULATORY_CARE_PROVIDER_SITE_OTHER): Payer: BLUE CROSS/BLUE SHIELD | Admitting: Medical

## 2015-06-21 ENCOUNTER — Encounter: Payer: Self-pay | Admitting: Medical

## 2015-06-21 ENCOUNTER — Ambulatory Visit: Payer: BLUE CROSS/BLUE SHIELD | Admitting: Family Medicine

## 2015-06-21 VITALS — BP 135/80 | HR 78 | Temp 98.1°F | Ht 61.0 in | Wt 112.4 lb

## 2015-06-21 DIAGNOSIS — I10 Essential (primary) hypertension: Secondary | ICD-10-CM

## 2015-06-21 DIAGNOSIS — E871 Hypo-osmolality and hyponatremia: Secondary | ICD-10-CM | POA: Diagnosis not present

## 2015-06-21 LAB — COMPREHENSIVE METABOLIC PANEL
ALBUMIN: 5.1 g/dL (ref 3.5–5.2)
ALT: 28 U/L (ref 0–35)
AST: 36 U/L (ref 0–37)
Alkaline Phosphatase: 87 U/L (ref 39–117)
BILIRUBIN TOTAL: 0.6 mg/dL (ref 0.2–1.2)
BUN: 6 mg/dL (ref 6–23)
CALCIUM: 10.7 mg/dL — AB (ref 8.4–10.5)
CO2: 27 mEq/L (ref 19–32)
CREATININE: 0.75 mg/dL (ref 0.40–1.20)
Chloride: 93 mEq/L — ABNORMAL LOW (ref 96–112)
GFR: 84.22 mL/min (ref >60.00)
Glucose, Bld: 95 mg/dL (ref 70–99)
Potassium: 4 mEq/L (ref 3.5–5.1)
Sodium: 130 mEq/L — ABNORMAL LOW (ref 135–145)
Total Protein: 8.2 g/dL (ref 6.0–8.3)

## 2015-06-21 NOTE — Patient Instructions (Addendum)
For your blood pressure, I want you to continue the same meds. No changes.   See neurologist upcoming week.   Keep appointment with Dr. Etter Sjogren if not direct conflict with neurologist appointment. We should get neruologist note.  We will get stat cmp today and notify you of your sodium results.  Follow up in 3 wks with Dr. Etter Sjogren or myself

## 2015-06-21 NOTE — Telephone Encounter (Signed)
Patient voices understanding.

## 2015-06-21 NOTE — Telephone Encounter (Signed)
Pt needs appointment

## 2015-06-21 NOTE — Telephone Encounter (Signed)
Pt was seen on 06/18/15 by Mackie Pai, PA-C.  She has a follow up appt with Percell Miller on 06/21/15 and an office visit scheduled with Dr. Etter Sjogren on 06/28/15.

## 2015-06-21 NOTE — Telephone Encounter (Addendum)
Neuro referral already placed.  Order for home health PT and cane placed.  Called patient to make her aware.  Left a message on phone for call back.

## 2015-06-21 NOTE — Telephone Encounter (Signed)
Please put referral in for neuro, home health

## 2015-06-21 NOTE — Progress Notes (Signed)
Pre visit review using our clinic review tool, if applicable. No additional management support is needed unless otherwise documented below in the visit note. 

## 2015-06-21 NOTE — Addendum Note (Signed)
Addended by: Rudene Anda on: 06/21/2015 10:10 AM   Modules accepted: Orders

## 2015-06-21 NOTE — Progress Notes (Signed)
Subjective:    Patient ID: [name], female    DOB: January 15, 1956, 59 y.o.   MRN: [id number]  HPI  Pt in for follow up. Pt bp are better than other day when she came in. The day I last saw her she had high level before the took extra dose of her meds. Now her bp level have improved with amlodipine 10 mg a day in addition to lisiniopril 40 mg a day. And taking toprol xl 50 mg a day. Pt has not cardiac or neurologic signs or symptoms. Pt thinks appointment with neurologist is June 28, 2015.  Over weekend her bp were 118/71, 117/75, 132/74, 128/80, 113/70, 134/82, 143/86, 162/88, and 148/86.  Pt had low na on Friday. I notified her after hours. I had to leave a message. Pt does drink fair amount of seltzer water. She did  back this weekend after my instruction. Pt feels some mild fatigue but no confusion.    Review of Systems  Constitutional: Positive for fatigue. Negative for fever and chills.       Faint mid fatigue.  HENT: Negative for congestion and drooling.   Respiratory: Negative for cough, chest tightness, shortness of breath and wheezing.   Cardiovascular: Negative for chest pain and palpitations.  Gastrointestinal: Negative for abdominal pain.  Musculoskeletal: Negative for back pain.  Neurological: Negative for dizziness and headaches.  Hematological: Negative for adenopathy. Does not bruise/bleed easily.  Psychiatric/Behavioral: Negative for behavioral problems and confusion.   Past Medical History  Diagnosis Date  . Hypertension   . Anxiety   . Depression   . Substance abuse   . Cancer ([place]) 04/2014    squamous cell carcinoma L inner thigh    Social History   Social History  . Marital Status: Divorced    Spouse Name: N/A  . Number of Children: N/A  . Years of Education: N/A   Occupational History  . [employer]    Social History Main Topics  . Smoking status: Never Smoker   . Smokeless tobacco: Never Used  . Alcohol Use: No     Comment: rare-- 2  x a month  . Drug Use: No  . Sexual Activity:    Partners: Male   Other Topics Concern  . Not on file   Social History Narrative   Exercise--- gym 6x a week    Past Surgical History  Procedure Laterality Date  . Nose surgery      Epistaxis    Family History  Problem Relation Age of Onset  . Hypertension Mother   . Kidney disease Mother     dialysis  . Alzheimer's disease Mother   . Hyperlipidemia Father   . Stroke Paternal Grandmother     Allergies  Allergen Reactions  . Codeine     REACTION: insomnia and anxious    Current Outpatient Prescriptions on File Prior to Visit  Medication Sig Dispense Refill  . alendronate (FOSAMAX) 70 MG tablet Take 1 tablet (70 mg total) by mouth once a week. Take with a full glass of water on an empty stomach. 4 tablet 11  . amLODipine (NORVASC) 10 MG tablet Take 1 tablet (10 mg total) by mouth daily. 30 tablet 0  . aspirin EC 325 MG EC tablet Take 1 tablet (325 mg total) by mouth daily. 30 tablet 0  . atorvastatin (LIPITOR) 20 MG tablet Take 1 tablet (20 mg total) by mouth daily at 6 PM. 30 tablet 0  . lisinopril (PRINIVIL,ZESTRIL) 40  MG tablet TAKE ONE TABLET BY MOUTH ONE TIME DAILY 90 tablet 0  . metoprolol succinate (TOPROL-XL) 50 MG 24 hr tablet Take 1 tablet (50 mg total) by mouth daily. Take with or immediately following a meal. 90 tablet 3   No current facility-administered medications on file prior to visit.    BP 135/80 mmHg  Pulse 78  Temp(Src) 98.1 F (36.7 C) (Oral)  Ht 5\' 1"  (1.549 m)  Wt 112 lb 6.4 oz (50.984 kg)  BMI 21.25 kg/m2  SpO2 99%       Objective:   Physical Exam  General Mental Status- Alert. General Appearance- Not in acute distress.   Skin General: Color- Normal Color. Moisture- Normal Moisture.  Neck Carotid Arteries- Normal color. Moisture- Normal Moisture. No carotid bruits. No JVD.  Chest and Lung Exam Auscultation: Breath Sounds:-Normal.  Cardiovascular Auscultation:Rythm-  Regular. Murmurs & Other Heart Sounds:Auscultation of the heart reveals- No Murmurs.  Abdomen Inspection:-Inspeection Normal. Palpation/Percussion:Note:No mass. Palpation and Percussion of the abdomen reveal- Non Tender, Non Distended + BS, no rebound or guarding.    Neurologic Cranial Nerve exam:- CN III-XII intact(No nystagmus), symmetric smile. Strength:- 5/5 equal and symmetric strength both upper and lower extremities.      Assessment & Plan:  For your blood pressure, I want you to continue the same meds. No changes.   See neurologist upcoming week.   Keep appointment with Dr. Etter Sjogren if not direct conflict with neurologist appointment. We should get neruologist note.  We will get stat cmp today and notify you of your sodium results.  Follow up in 3 wks with Dr. Etter Sjogren or myself

## 2015-06-28 ENCOUNTER — Encounter: Payer: Self-pay | Admitting: Neurology

## 2015-06-28 ENCOUNTER — Ambulatory Visit: Payer: BLUE CROSS/BLUE SHIELD | Admitting: Family Medicine

## 2015-06-28 ENCOUNTER — Ambulatory Visit (INDEPENDENT_AMBULATORY_CARE_PROVIDER_SITE_OTHER): Payer: BLUE CROSS/BLUE SHIELD | Admitting: Neurology

## 2015-06-28 VITALS — BP 118/80 | HR 87 | Ht 61.0 in | Wt 113.0 lb

## 2015-06-28 DIAGNOSIS — G451 Carotid artery syndrome (hemispheric): Secondary | ICD-10-CM

## 2015-06-28 DIAGNOSIS — F1010 Alcohol abuse, uncomplicated: Secondary | ICD-10-CM

## 2015-06-28 DIAGNOSIS — R270 Ataxia, unspecified: Secondary | ICD-10-CM

## 2015-06-28 DIAGNOSIS — G319 Degenerative disease of nervous system, unspecified: Secondary | ICD-10-CM | POA: Diagnosis not present

## 2015-06-28 NOTE — Progress Notes (Signed)
[name] was seen today in neurologic consultation at the request of Garnet Koyanagi, DO.    The patient presents for neurologic consultation today.  Multiple hospital records and physician records were reviewed.  Patient presented to the emergency room on 06/13/2015.  She had apparently gone to urgent care prior to going to the emergency room.  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  She had an MRI of the brain without gadolinium on 06/13/2015 that I had the opportunity to review.  There was severe atrophy and, in my opinion, the ventricle sizes were not out of proportion to degree of atrophy.  She had an MRA of the brain that was normal.  She had lab work in the emergency room.  She was hyponatremic, but she has been hyponatremic for years.  Her baseline sodium is between 127 and 129.  She had a urine drug screen that was negative.  She did have a blood alcohol level of 49 (patient reports that she came from work and did not drink alcohol that day).  She reports that she used to drink EtOH heavily 15 years ago but states that she "cut back" and now only drinks 3 beers per week but trying to cut back to 1 per week.  The rest of her lab work was unrevealing.  Her weakness of the arm and leg on the left apparently resolved in the emergency room but she did have some facial weakness that resolved while in the hospital.  She initially saw Dr. 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  He started her on amantadine.  Subsequently she was seen by neurosurgery who did not feel that she had NPH.  Dr. Leonel Ramsay then saw the patient for neurology and he agreed that the patient did not had NPH and he discontinued her amantadine.  She had a carotid ultrasound that was normal demonstrating 1-39% stenosis bilaterally.  She had an echocardiogram  demonstrating a normal left ventricular ejection fraction of 55-60% and mild left ventricular hypertrophy.  I was able to review a CT of her brain from 2005 which demonstrated advanced atrophy even then.   ALLERGIES:   Allergies  Allergen Reactions  . Codeine     REACTION: insomnia and anxious    CURRENT MEDICATIONS:  Outpatient Encounter Prescriptions as of 06/28/2015  Medication Sig  . alendronate (FOSAMAX) 70 MG tablet Take 1 tablet (70 mg total) by mouth once a week. Take with a full glass of water on an empty stomach.  Marland Kitchen amLODipine (NORVASC) 10 MG tablet Take 1 tablet (10 mg total) by mouth daily.  Marland Kitchen aspirin EC 325 MG EC tablet Take 1 tablet (325 mg total) by mouth daily.  Marland Kitchen atorvastatin (LIPITOR) 20 MG tablet Take 1 tablet (20 mg total) by mouth daily at 6 PM.  . lisinopril (PRINIVIL,ZESTRIL) 40 MG tablet TAKE ONE TABLET BY MOUTH ONE TIME DAILY  . metoprolol succinate (TOPROL-XL) 50 MG 24 hr tablet Take 1 tablet (50 mg total) by mouth daily. Take with or immediately following a meal.   No facility-administered encounter medications on file as of 06/28/2015.    PAST MEDICAL HISTORY:   Past Medical History  Diagnosis Date  . Hypertension   . Anxiety   . Depression   . Substance abuse   . Cancer ([place]) 04/2014  squamous cell carcinoma L inner thigh  . TIA (transient ischemic attack)     PAST SURGICAL HISTORY:   Past Surgical History  Procedure Laterality Date  . Nose surgery      Epistaxis  . Breast enhancement surgery    . Bunionectomy    . Tonsillectomy      SOCIAL HISTORY:   Social History   Social History  . Marital Status: Divorced    Spouse Name: N/A  . Number of Children: N/A  . Years of Education: N/A   Occupational History  . homehealth    Social History Main Topics  . Smoking status: Never Smoker   . Smokeless tobacco: Never Used  . Alcohol Use: No     Comment: 3 times a week, states hx of heavy use 15 years ago  . Drug Use: No  . Sexual  Activity:    Partners: Male   Other Topics Concern  . Not on file   Social History Narrative   Exercise--- gym 6x a week    FAMILY HISTORY:   Family Status  Relation Status Death Age  . Mother Deceased 65    alzheimers, HTN  . Father Alive     hyperlipidemia  . Son Alive     healthy  . Daughter Alive     healthy    ROS:  A complete 10 system review of systems was obtained and was unremarkable apart from what is mentioned above.  PHYSICAL EXAMINATION:    VITALS:   Filed Vitals:   06/28/15 1251  BP: 118/80  Pulse: 87  Height: 5\' 1"  (1.549 m)  Weight: 113 lb (51.256 kg)    GEN:  Normal appears female in no acute distress.  Appears stated age. HEENT:  Normocephalic, atraumatic. The mucous membranes are moist. The superficial temporal arteries are without ropiness or tenderness. Cardiovascular: Regular rate and rhythm. Lungs: Clear to auscultation bilaterally. Neck/Heme: There are no carotid bruits noted bilaterally.  NEUROLOGICAL: Orientation:  The patient is alert and oriented x 3.  Fund of knowledge is appropriate.  Cranial nerves: There is good facial symmetry. The pupils are equal round and reactive to light bilaterally. Fundoscopic exam reveals clear disc margin on the left.  Disc margin was not well visualized on the right. Extraocular muscles are intact and visual fields are full to confrontational testing. Speech is fluent but mildly slurred/dysarthric. Soft palate rises symmetrically and there is no tongue deviation. Hearing is intact to conversational tone. Tone: Tone is good throughout. Sensation: Sensation is intact to light touch and pinprick throughout (facial, trunk, extremities). Vibration is intact at the bilateral big toe but it is markedly decreased. There is no extinction with double simultaneous stimulation. There is no sensory dermatomal level identified. Coordination:  The patient has no difficulty with RAM's or FNF bilaterally. Motor: Strength is  5/5 in the bilateral upper and lower extremities.  Shoulder shrug is equal and symmetric. There is no pronator drift.  There are no fasciculations noted. DTR's: Deep tendon reflexes are 2+/4 at the bilateral biceps, triceps, brachioradialis, patella and achilles.  Plantar responses are downgoing bilaterally. Gait and Station: The patient is able to ambulate without difficulty.  She has a very wide-based and somewhat ataxic gait.  She is unable to ambulate in a tandem fashion.  She is able to heel toe walk.  She is able to stand in the Romberg position with eyes open and closed, but does sway with her eyes closed. Abnormal movements: She has mild  tremor of the outstretched hands.  She has minimal difficulty with Archimedes spirals.  The biggest difficulty is getting her hand on the paper to draw the spirals because she exhibits tremor then.    Chemistry      Component Value Date/Time   NA 130* 06/21/2015 1459   K 4.0 06/21/2015 1459   CL 93* 06/21/2015 1459   CO2 27 06/21/2015 1459   BUN 6 06/21/2015 1459   CREATININE 0.75 06/21/2015 1459   CREATININE 0.73 02/27/2014 1538      Component Value Date/Time   CALCIUM 10.7* 06/21/2015 1459   ALKPHOS 87 06/21/2015 1459   AST 36 06/21/2015 1459   ALT 28 06/21/2015 1459   BILITOT 0.6 06/21/2015 1459     Lab Results  Component Value Date   WBC 9.9 06/18/2015   HGB 14.6 06/18/2015   HCT 42.8 06/18/2015   MCV 104.4* 06/18/2015   PLT 290.0 06/18/2015      IMPRESSION/PLAN  1. TIA  -It seems evident that the patient did have a TIA on 06/13/2015.  She had an extensive workup.  Her MRI of the brain, MRA of the brain, carotid ultrasound and echocardiogram were unremarkable.  She was started on Lipitor in the hospital.  Her LDL is 141 with a goal of less than 70.  She is now on aspirin therapy.  Discussed signs and symptoms of stroke.  She should call 911 and go to the hospital immediately should she experience any of these  2.  Abnormal brain  scan  -The patient has very severe atrophy for her age.  This is not new and she had atrophy in 2005, although it has progressed since that time.  It is my suspicion that this is likely from alcohol.  She did have an alcohol level in the emergency room even though she reported that she had not drank alcohol and was coming from work as a Neurosurgeon.  She does have a history of abuse of alcohol in the past and while she reports that she has cut back to 3 drinks (beers) a week, I suspect it is much more.  I suspect that she potentially had EtOH in her system today.  Her labs show macrocytosis.  She and I discussed this in detail and I pulled up her MRI of the brain and I talked to her about potential future consequences of continued atrophy, including memory loss/alcohol dementia and continued progression of her ataxia.  I told her she needed to discontinue alcohol use.  3.  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  -I saw absolutely no evidence of any type of parkinsonism, including NPH.  4.  She will follow-up with me on an as-needed basis.  Much greater than 50% of this visit was spent in counseling and coordinating care.  Total time:  80 min.

## 2015-06-29 ENCOUNTER — Ambulatory Visit: Payer: BLUE CROSS/BLUE SHIELD | Admitting: Family Medicine

## 2015-07-02 NOTE — Telephone Encounter (Signed)
Pt was seen by Neuro on 06/28/15.

## 2015-07-10 ENCOUNTER — Other Ambulatory Visit: Payer: Self-pay | Admitting: Family Medicine

## 2015-07-11 ENCOUNTER — Other Ambulatory Visit: Payer: Self-pay | Admitting: Family Medicine

## 2015-07-11 ENCOUNTER — Other Ambulatory Visit: Payer: Self-pay | Admitting: Medical

## 2015-07-13 ENCOUNTER — Other Ambulatory Visit: Payer: Self-pay | Admitting: Medical

## 2015-07-13 ENCOUNTER — Encounter: Payer: Self-pay | Admitting: Medical

## 2015-07-13 MED ORDER — LISINOPRIL 40 MG PO TABS: 40.0000 mg | ORAL_TABLET | Freq: Every day | ORAL | Status: DC

## 2015-07-13 MED ORDER — ASPIRIN 325 MG PO TBEC: 325.0000 mg | DELAYED_RELEASE_TABLET | Freq: Every day | ORAL | Status: DC

## 2015-07-13 MED ORDER — ATORVASTATIN CALCIUM 20 MG PO TABS: 20.0000 mg | ORAL_TABLET | Freq: Every day | ORAL | Status: DC

## 2015-07-14 ENCOUNTER — Other Ambulatory Visit: Payer: Self-pay | Admitting: Family Medicine

## 2015-07-14 MED ORDER — AMLODIPINE BESYLATE 10 MG PO TABS: 10.0000 mg | ORAL_TABLET | Freq: Every day | ORAL | Status: DC

## 2015-07-14 MED ORDER — ASPIRIN 325 MG PO TBEC: 325.0000 mg | DELAYED_RELEASE_TABLET | Freq: Every day | ORAL | Status: DC

## 2015-07-14 NOTE — Telephone Encounter (Signed)
[name] is it ok to refill this medication for this patient?

## 2015-07-14 NOTE — Telephone Encounter (Signed)
Pt stated needed refill of her amlodipine. She states is working very well for her bp. She has physical in march with Dr. Etter Sjogren. I sent in 3 month rx today.

## 2015-07-25 ENCOUNTER — Encounter: Payer: Self-pay | Admitting: Medical

## 2015-08-04 NOTE — Telephone Encounter (Signed)
Please have pt come in for an appointment in the morning this week or next.

## 2015-08-04 NOTE — Telephone Encounter (Signed)
Pt bp l level  Pretty gon on review of her bp log. Most recently controlled and she states without newest med amlodipine. On 06-18-2015 visit with me her bp were quite high and she reported hx of TIA. So I would like her to have office visit and confirm these most recent readings. Doe she have machine that she uses to check bp or is it a manual cuff. If machine then I want her to bring it in. We will compare our reading to her machine readings on the day that she in for bp check. Give her early morning or early afternoon appointment. So chance less wait.

## 2015-09-09 ENCOUNTER — Encounter: Payer: Self-pay | Admitting: Family Medicine

## 2015-09-09 ENCOUNTER — Other Ambulatory Visit: Payer: Self-pay

## 2015-09-09 DIAGNOSIS — Z1231 Encounter for screening mammogram for malignant neoplasm of breast: Secondary | ICD-10-CM

## 2015-09-21 ENCOUNTER — Ambulatory Visit (INDEPENDENT_AMBULATORY_CARE_PROVIDER_SITE_OTHER): Payer: BLUE CROSS/BLUE SHIELD | Admitting: Family Medicine

## 2015-09-21 ENCOUNTER — Encounter: Payer: Self-pay | Admitting: Family Medicine

## 2015-09-21 VITALS — BP 140/89 | HR 68 | Temp 97.9°F | Ht 61.0 in | Wt 117.0 lb

## 2015-09-21 DIAGNOSIS — I10 Essential (primary) hypertension: Secondary | ICD-10-CM

## 2015-09-21 DIAGNOSIS — E785 Hyperlipidemia, unspecified: Secondary | ICD-10-CM

## 2015-09-21 DIAGNOSIS — Z0000 Encounter for general adult medical examination without abnormal findings: Secondary | ICD-10-CM

## 2015-09-21 DIAGNOSIS — M8580 Other specified disorders of bone density and structure, unspecified site: Secondary | ICD-10-CM

## 2015-09-21 DIAGNOSIS — R8299 Other abnormal findings in urine: Secondary | ICD-10-CM

## 2015-09-21 DIAGNOSIS — R82998 Other abnormal findings in urine: Secondary | ICD-10-CM

## 2015-09-21 DIAGNOSIS — R600 Localized edema: Secondary | ICD-10-CM

## 2015-09-21 LAB — LIPID PANEL
CHOL/HDL RATIO: 3
Cholesterol: 193 mg/dL (ref 0–200)
HDL: 69.8 mg/dL (ref >39.00)
LDL Cholesterol: 108 mg/dL — ABNORMAL HIGH (ref 0–99)
NonHDL: 122.78
TRIGLYCERIDES: 72 mg/dL (ref 0.0–149.0)
VLDL: 14.4 mg/dL (ref 0.0–40.0)

## 2015-09-21 LAB — COMPREHENSIVE METABOLIC PANEL
ALBUMIN: 4.8 g/dL (ref 3.5–5.2)
ALK PHOS: 67 U/L (ref 39–117)
ALT: 27 U/L (ref 0–35)
AST: 55 U/L — AB (ref 0–37)
BILIRUBIN TOTAL: 0.5 mg/dL (ref 0.2–1.2)
BUN: 9 mg/dL (ref 6–23)
CO2: 20 mEq/L (ref 19–32)
CREATININE: 0.75 mg/dL (ref 0.40–1.20)
Calcium: 10.4 mg/dL (ref 8.4–10.5)
Chloride: 99 mEq/L (ref 96–112)
GFR: 84.15 mL/min (ref >60.00)
Glucose, Bld: 94 mg/dL (ref 70–99)
POTASSIUM: 4.6 meq/L (ref 3.5–5.1)
SODIUM: 134 meq/L — AB (ref 135–145)
TOTAL PROTEIN: 8 g/dL (ref 6.0–8.3)

## 2015-09-21 LAB — TSH: TSH: 0.97 u[IU]/mL (ref 0.35–4.50)

## 2015-09-21 LAB — CBC WITH DIFFERENTIAL/PLATELET
BASOS ABS: 0 10*3/uL (ref 0.0–0.1)
Basophils Relative: 0.4 % (ref 0.0–3.0)
EOS ABS: 0.1 10*3/uL (ref 0.0–0.7)
Eosinophils Relative: 1.7 % (ref 0.0–5.0)
HEMATOCRIT: 39.3 % (ref 36.0–46.0)
HEMOGLOBIN: 13.4 g/dL (ref 12.0–15.0)
LYMPHS PCT: 20.6 % (ref 12.0–46.0)
Lymphs Abs: 1.4 10*3/uL (ref 0.7–4.0)
MCHC: 34.2 g/dL (ref 30.0–36.0)
MCV: 103.9 fl — ABNORMAL HIGH (ref 78.0–100.0)
Monocytes Absolute: 0.7 10*3/uL (ref 0.1–1.0)
Monocytes Relative: 10.1 % (ref 3.0–12.0)
NEUTROS ABS: 4.6 10*3/uL (ref 1.4–7.7)
Neutrophils Relative %: 67.2 % (ref 43.0–77.0)
PLATELETS: 276 10*3/uL (ref 150.0–400.0)
RBC: 3.78 Mil/uL — ABNORMAL LOW (ref 3.87–5.11)
RDW: 12.8 % (ref 11.5–15.5)
WBC: 6.8 10*3/uL (ref 4.0–10.5)

## 2015-09-21 LAB — POCT URINALYSIS DIPSTICK
Bilirubin, UA: NEGATIVE
Blood, UA: NEGATIVE
GLUCOSE UA: NEGATIVE
Ketones, UA: NEGATIVE
Nitrite, UA: NEGATIVE
Protein, UA: NEGATIVE
SPEC GRAV UA: 1.02
UROBILINOGEN UA: 0.2
pH, UA: 6

## 2015-09-21 MED ORDER — HYDROCHLOROTHIAZIDE 25 MG PO TABS: 25.0000 mg | ORAL_TABLET | Freq: Every day | ORAL | Status: DC

## 2015-09-21 MED ORDER — ALENDRONATE SODIUM 70 MG PO TABS: 70.0000 mg | ORAL_TABLET | ORAL | Status: DC

## 2015-09-21 NOTE — Patient Instructions (Signed)
Preventive Care for Adults, Female A healthy lifestyle and preventive care can promote health and wellness. Preventive health guidelines for women include the following key practices.  A routine yearly physical is a good way to check with your health care provider about your health and preventive screening. It is a chance to share any concerns and updates on your health and to receive a thorough exam.  Visit your dentist for a routine exam and preventive care every 6 months. Brush your teeth twice a day and floss once a day. Good oral hygiene prevents tooth decay and gum disease.  The frequency of eye exams is based on your age, health, family medical history, use of contact lenses, and other factors. Follow your health care provider's recommendations for frequency of eye exams.  Eat a healthy diet. Foods like vegetables, fruits, whole grains, low-fat dairy products, and lean protein foods contain the nutrients you need without too many calories. Decrease your intake of foods high in solid fats, added sugars, and salt. Eat the right amount of calories for you.Get information about a proper diet from your health care provider, if necessary.  Regular physical exercise is one of the most important things you can do for your health. Most adults should get at least 150 minutes of moderate-intensity exercise (any activity that increases your heart rate and causes you to sweat) each week. In addition, most adults need muscle-strengthening exercises on 2 or more days a week.  Maintain a healthy weight. The body mass index (BMI) is a screening tool to identify possible weight problems. It provides an estimate of body fat based on height and weight. Your health care provider can find your BMI and can help you achieve or maintain a healthy weight.For adults 20 years and older:  A BMI below 18.5 is considered underweight.  A BMI of 18.5 to 24.9 is normal.  A BMI of 25 to 29.9 is considered overweight.  A  BMI of 30 and above is considered obese.  Maintain normal blood lipids and cholesterol levels by exercising and minimizing your intake of saturated fat. Eat a balanced diet with plenty of fruit and vegetables. Blood tests for lipids and cholesterol should begin at age 45 and be repeated every 5 years. If your lipid or cholesterol levels are high, you are over 50, or you are at high risk for heart disease, you may need your cholesterol levels checked more frequently.Ongoing high lipid and cholesterol levels should be treated with medicines if diet and exercise are not working.  If you smoke, find out from your health care provider how to quit. If you do not use tobacco, do not start.  Lung cancer screening is recommended for adults aged 45-80 years who are at high risk for developing lung cancer because of a history of smoking. A yearly low-dose CT scan of the lungs is recommended for people who have at least a 30-pack-year history of smoking and are a current smoker or have quit within the past 15 years. A pack year of smoking is smoking an average of 1 pack of cigarettes a day for 1 year (for example: 1 pack a day for 30 years or 2 packs a day for 15 years). Yearly screening should continue until the smoker has stopped smoking for at least 15 years. Yearly screening should be stopped for people who develop a health problem that would prevent them from having lung cancer treatment.  If you are pregnant, do not drink alcohol. If you are  breastfeeding, be very cautious about drinking alcohol. If you are not pregnant and choose to drink alcohol, do not have more than 1 drink per day. One drink is considered to be 12 ounces (355 mL) of beer, 5 ounces (148 mL) of wine, or 1.5 ounces (44 mL) of liquor.  Avoid use of street drugs. Do not share needles with anyone. Ask for help if you need support or instructions about stopping the use of drugs.  High blood pressure causes heart disease and increases the risk  of stroke. Your blood pressure should be checked at least every 1 to 2 years. Ongoing high blood pressure should be treated with medicines if weight loss and exercise do not work.  If you are 55-79 years old, ask your health care provider if you should take aspirin to prevent strokes.  Diabetes screening is done by taking a blood sample to check your blood glucose level after you have not eaten for a certain period of time (fasting). If you are not overweight and you do not have risk factors for diabetes, you should be screened once every 3 years starting at age 45. If you are overweight or obese and you are 40-70 years of age, you should be screened for diabetes every year as part of your cardiovascular risk assessment.  Breast cancer screening is essential preventive care for women. You should practice "breast self-awareness." This means understanding the normal appearance and feel of your breasts and may include breast self-examination. Any changes detected, no matter how small, should be reported to a health care provider. Women in their 20s and 30s should have a clinical breast exam (CBE) by a health care provider as part of a regular health exam every 1 to 3 years. After age 40, women should have a CBE every year. Starting at age 40, women should consider having a mammogram (breast X-ray test) every year. Women who have a family history of breast cancer should talk to their health care provider about genetic screening. Women at a high risk of breast cancer should talk to their health care providers about having an MRI and a mammogram every year.  Breast cancer gene (BRCA)-related cancer risk assessment is recommended for women who have family members with BRCA-related cancers. BRCA-related cancers include breast, ovarian, tubal, and peritoneal cancers. Having family members with these cancers may be associated with an increased risk for harmful changes (mutations) in the breast cancer genes BRCA1 and  BRCA2. Results of the assessment will determine the need for genetic counseling and BRCA1 and BRCA2 testing.  Your health care provider may recommend that you be screened regularly for cancer of the pelvic organs (ovaries, uterus, and vagina). This screening involves a pelvic examination, including checking for microscopic changes to the surface of your cervix (Pap test). You may be encouraged to have this screening done every 3 years, beginning at age 21.  For women ages 30-65, health care providers may recommend pelvic exams and Pap testing every 3 years, or they may recommend the Pap and pelvic exam, combined with testing for human papilloma virus (HPV), every 5 years. Some types of HPV increase your risk of cervical cancer. Testing for HPV may also be done on women of any age with unclear Pap test results.  Other health care providers may not recommend any screening for nonpregnant women who are considered low risk for pelvic cancer and who do not have symptoms. Ask your health care provider if a screening pelvic exam is right for   you.  If you have had past treatment for cervical cancer or a condition that could lead to cancer, you need Pap tests and screening for cancer for at least 20 years after your treatment. If Pap tests have been discontinued, your risk factors (such as having a new sexual partner) need to be reassessed to determine if screening should resume. Some women have medical problems that increase the chance of getting cervical cancer. In these cases, your health care provider may recommend more frequent screening and Pap tests.  Colorectal cancer can be detected and often prevented. Most routine colorectal cancer screening begins at the age of 50 years and continues through age 75 years. However, your health care provider may recommend screening at an earlier age if you have risk factors for colon cancer. On a yearly basis, your health care provider may provide home test kits to check  for hidden blood in the stool. Use of a small camera at the end of a tube, to directly examine the colon (sigmoidoscopy or colonoscopy), can detect the earliest forms of colorectal cancer. Talk to your health care provider about this at age 50, when routine screening begins. Direct exam of the colon should be repeated every 5-10 years through age 75 years, unless early forms of precancerous polyps or small growths are found.  People who are at an increased risk for hepatitis B should be screened for this virus. You are considered at high risk for hepatitis B if:  You were born in a country where hepatitis B occurs often. Talk with your health care provider about which countries are considered high risk.  Your parents were born in a high-risk country and you have not received a shot to protect against hepatitis B (hepatitis B vaccine).  You have HIV or AIDS.  You use needles to inject street drugs.  You live with, or have sex with, someone who has hepatitis B.  You get hemodialysis treatment.  You take certain medicines for conditions like cancer, organ transplantation, and autoimmune conditions.  Hepatitis C blood testing is recommended for all people born from 1945 through 1965 and any individual with known risks for hepatitis C.  Practice safe sex. Use condoms and avoid high-risk sexual practices to reduce the spread of sexually transmitted infections (STIs). STIs include gonorrhea, chlamydia, syphilis, trichomonas, herpes, HPV, and human immunodeficiency virus (HIV). Herpes, HIV, and HPV are viral illnesses that have no cure. They can result in disability, cancer, and death.  You should be screened for sexually transmitted illnesses (STIs) including gonorrhea and chlamydia if:  You are sexually active and are younger than 24 years.  You are older than 24 years and your health care provider tells you that you are at risk for this type of infection.  Your sexual activity has changed  since you were last screened and you are at an increased risk for chlamydia or gonorrhea. Ask your health care provider if you are at risk.  If you are at risk of being infected with HIV, it is recommended that you take a prescription medicine daily to prevent HIV infection. This is called preexposure prophylaxis (PrEP). You are considered at risk if:  You are sexually active and do not regularly use condoms or know the HIV status of your partner(s).  You take drugs by injection.  You are sexually active with a partner who has HIV.  Talk with your health care provider about whether you are at high risk of being infected with HIV. If   you choose to begin PrEP, you should first be tested for HIV. You should then be tested every 3 months for as long as you are taking PrEP.  Osteoporosis is a disease in which the bones lose minerals and strength with aging. This can result in serious bone fractures or breaks. The risk of osteoporosis can be identified using a bone density scan. Women ages 67 years and over and women at risk for fractures or osteoporosis should discuss screening with their health care providers. Ask your health care provider whether you should take a calcium supplement or vitamin D to reduce the rate of osteoporosis.  Menopause can be associated with physical symptoms and risks. Hormone replacement therapy is available to decrease symptoms and risks. You should talk to your health care provider about whether hormone replacement therapy is right for you.  Use sunscreen. Apply sunscreen liberally and repeatedly throughout the day. You should seek shade when your shadow is shorter than you. Protect yourself by wearing long sleeves, pants, a wide-brimmed hat, and sunglasses year round, whenever you are outdoors.  Once a month, do a whole body skin exam, using a mirror to look at the skin on your back. Tell your health care provider of new moles, moles that have irregular borders, moles that  are larger than a pencil eraser, or moles that have changed in shape or color.  Stay current with required vaccines (immunizations).  Influenza vaccine. All adults should be immunized every year.  Tetanus, diphtheria, and acellular pertussis (Td, Tdap) vaccine. Pregnant women should receive 1 dose of Tdap vaccine during each pregnancy. The dose should be obtained regardless of the length of time since the last dose. Immunization is preferred during the 27th-36th week of gestation. An adult who has not previously received Tdap or who does not know her vaccine status should receive 1 dose of Tdap. This initial dose should be followed by tetanus and diphtheria toxoids (Td) booster doses every 10 years. Adults with an unknown or incomplete history of completing a 3-dose immunization series with Td-containing vaccines should begin or complete a primary immunization series including a Tdap dose. Adults should receive a Td booster every 10 years.  Varicella vaccine. An adult without evidence of immunity to varicella should receive 2 doses or a second dose if she has previously received 1 dose. Pregnant females who do not have evidence of immunity should receive the first dose after pregnancy. This first dose should be obtained before leaving the health care facility. The second dose should be obtained 4-8 weeks after the first dose.  Human papillomavirus (HPV) vaccine. Females aged 13-26 years who have not received the vaccine previously should obtain the 3-dose series. The vaccine is not recommended for use in pregnant females. However, pregnancy testing is not needed before receiving a dose. If a female is found to be pregnant after receiving a dose, no treatment is needed. In that case, the remaining doses should be delayed until after the pregnancy. Immunization is recommended for any person with an immunocompromised condition through the age of 61 years if she did not get any or all doses earlier. During the  3-dose series, the second dose should be obtained 4-8 weeks after the first dose. The third dose should be obtained 24 weeks after the first dose and 16 weeks after the second dose.  Zoster vaccine. One dose is recommended for adults aged 30 years or older unless certain conditions are present.  Measles, mumps, and rubella (MMR) vaccine. Adults born  before 1957 generally are considered immune to measles and mumps. Adults born in 1957 or later should have 1 or more doses of MMR vaccine unless there is a contraindication to the vaccine or there is laboratory evidence of immunity to each of the three diseases. A routine second dose of MMR vaccine should be obtained at least 28 days after the first dose for students attending postsecondary schools, health care workers, or international travelers. People who received inactivated measles vaccine or an unknown type of measles vaccine during 1963-1967 should receive 2 doses of MMR vaccine. People who received inactivated mumps vaccine or an unknown type of mumps vaccine before 1979 and are at high risk for mumps infection should consider immunization with 2 doses of MMR vaccine. For females of childbearing age, rubella immunity should be determined. If there is no evidence of immunity, females who are not pregnant should be vaccinated. If there is no evidence of immunity, females who are pregnant should delay immunization until after pregnancy. Unvaccinated health care workers born before 1957 who lack laboratory evidence of measles, mumps, or rubella immunity or laboratory confirmation of disease should consider measles and mumps immunization with 2 doses of MMR vaccine or rubella immunization with 1 dose of MMR vaccine.  Pneumococcal 13-valent conjugate (PCV13) vaccine. When indicated, a person who is uncertain of his immunization history and has no record of immunization should receive the PCV13 vaccine. All adults 65 years of age and older should receive this  vaccine. An adult aged 19 years or older who has certain medical conditions and has not been previously immunized should receive 1 dose of PCV13 vaccine. This PCV13 should be followed with a dose of pneumococcal polysaccharide (PPSV23) vaccine. Adults who are at high risk for pneumococcal disease should obtain the PPSV23 vaccine at least 8 weeks after the dose of PCV13 vaccine. Adults older than 59 years of age who have normal immune system function should obtain the PPSV23 vaccine dose at least 1 year after the dose of PCV13 vaccine.  Pneumococcal polysaccharide (PPSV23) vaccine. When PCV13 is also indicated, PCV13 should be obtained first. All adults aged 65 years and older should be immunized. An adult younger than age 65 years who has certain medical conditions should be immunized. Any person who resides in a nursing home or long-term care facility should be immunized. An adult smoker should be immunized. People with an immunocompromised condition and certain other conditions should receive both PCV13 and PPSV23 vaccines. People with human immunodeficiency virus (HIV) infection should be immunized as soon as possible after diagnosis. Immunization during chemotherapy or radiation therapy should be avoided. Routine use of PPSV23 vaccine is not recommended for American Indians, [place] Natives, or people younger than 65 years unless there are medical conditions that require PPSV23 vaccine. When indicated, people who have unknown immunization and have no record of immunization should receive PPSV23 vaccine. One-time revaccination 5 years after the first dose of PPSV23 is recommended for people aged 19-64 years who have chronic kidney failure, nephrotic syndrome, asplenia, or immunocompromised conditions. People who received 1-2 doses of PPSV23 before age 65 years should receive another dose of PPSV23 vaccine at age 65 years or later if at least 5 years have passed since the previous dose. Doses of PPSV23 are not  needed for people immunized with PPSV23 at or after age 65 years.  Meningococcal vaccine. Adults with asplenia or persistent complement component deficiencies should receive 2 doses of quadrivalent meningococcal conjugate (MenACWY-D) vaccine. The doses should be obtained   at least 2 months apart. Microbiologists working with certain meningococcal bacteria, [place] recruits, people at risk during an outbreak, and people who travel to or live in countries with a high rate of meningitis should be immunized. A first-year college student up through age 34 years who is living in a residence hall should receive a dose if she did not receive a dose on or after her 16th birthday. Adults who have certain high-risk conditions should receive one or more doses of vaccine.  Hepatitis A vaccine. Adults who wish to be protected from this disease, have certain high-risk conditions, work with hepatitis A-infected animals, work in hepatitis A research labs, or travel to or work in countries with a high rate of hepatitis A should be immunized. Adults who were previously unvaccinated and who anticipate close contact with an international adoptee during the first 60 days after arrival in the [place] from a country with a high rate of hepatitis A should be immunized.  Hepatitis B vaccine. Adults who wish to be protected from this disease, have certain high-risk conditions, may be exposed to blood or other infectious body fluids, are household contacts or sex partners of hepatitis B positive people, are clients or workers in certain care facilities, or travel to or work in countries with a high rate of hepatitis B should be immunized.  Haemophilus influenzae type b (Hib) vaccine. A previously unvaccinated person with asplenia or sickle cell disease or having a scheduled splenectomy should receive 1 dose of Hib vaccine. Regardless of previous immunization, a recipient of a hematopoietic stem cell transplant should receive a  3-dose series 6-12 months after her successful transplant. Hib vaccine is not recommended for adults with HIV infection. Preventive Services / Frequency Ages 35 to 4 years  Blood pressure check.** / Every 3-5 years.  Lipid and cholesterol check.** / Every 5 years beginning at age 60.  Clinical breast exam.** / Every 3 years for women in their 71s and 10s.  BRCA-related cancer risk assessment.** / For women who have family members with a BRCA-related cancer (breast, ovarian, tubal, or peritoneal cancers).  Pap test.** / Every 2 years from ages 76 through 26. Every 3 years starting at age 61 through age 76 or 93 with a history of 3 consecutive normal Pap tests.  HPV screening.** / Every 3 years from ages 37 through ages 60 to 51 with a history of 3 consecutive normal Pap tests.  Hepatitis C blood test.** / For any individual with known risks for hepatitis C.  Skin self-exam. / Monthly.  Influenza vaccine. / Every year.  Tetanus, diphtheria, and acellular pertussis (Tdap, Td) vaccine.** / Consult your health care provider. Pregnant women should receive 1 dose of Tdap vaccine during each pregnancy. 1 dose of Td every 10 years.  Varicella vaccine.** / Consult your health care provider. Pregnant females who do not have evidence of immunity should receive the first dose after pregnancy.  HPV vaccine. / 3 doses over 6 months, if 93 and younger. The vaccine is not recommended for use in pregnant females. However, pregnancy testing is not needed before receiving a dose.  Measles, mumps, rubella (MMR) vaccine.** / You need at least 1 dose of MMR if you were born in 1957 or later. You may also need a 2nd dose. For females of childbearing age, rubella immunity should be determined. If there is no evidence of immunity, females who are not pregnant should be vaccinated. If there is no evidence of immunity, females who are  pregnant should delay immunization until after pregnancy.  Pneumococcal  13-valent conjugate (PCV13) vaccine.** / Consult your health care provider.  Pneumococcal polysaccharide (PPSV23) vaccine.** / 1 to 2 doses if you smoke cigarettes or if you have certain conditions.  Meningococcal vaccine.** / 1 dose if you are age 68 to 8 years and a Market researcher living in a residence hall, or have one of several medical conditions, you need to get vaccinated against meningococcal disease. You may also need additional booster doses.  Hepatitis A vaccine.** / Consult your health care provider.  Hepatitis B vaccine.** / Consult your health care provider.  Haemophilus influenzae type b (Hib) vaccine.** / Consult your health care provider. Ages 7 to 53 years  Blood pressure check.** / Every year.  Lipid and cholesterol check.** / Every 5 years beginning at age 25 years.  Lung cancer screening. / Every year if you are aged 11-80 years and have a 30-pack-year history of smoking and currently smoke or have quit within the past 15 years. Yearly screening is stopped once you have quit smoking for at least 15 years or develop a health problem that would prevent you from having lung cancer treatment.  Clinical breast exam.** / Every year after age 48 years.  BRCA-related cancer risk assessment.** / For women who have family members with a BRCA-related cancer (breast, ovarian, tubal, or peritoneal cancers).  Mammogram.** / Every year beginning at age 41 years and continuing for as long as you are in good health. Consult with your health care provider.  Pap test.** / Every 3 years starting at age 65 years through age 37 or 70 years with a history of 3 consecutive normal Pap tests.  HPV screening.** / Every 3 years from ages 72 years through ages 60 to 40 years with a history of 3 consecutive normal Pap tests.  Fecal occult blood test (FOBT) of stool. / Every year beginning at age 21 years and continuing until age 5 years. You may not need to do this test if you get  a colonoscopy every 10 years.  Flexible sigmoidoscopy or colonoscopy.** / Every 5 years for a flexible sigmoidoscopy or every 10 years for a colonoscopy beginning at age 35 years and continuing until age 48 years.  Hepatitis C blood test.** / For all people born from [birth year] through 1965 and any individual with known risks for hepatitis C.  Skin self-exam. / Monthly.  Influenza vaccine. / Every year.  Tetanus, diphtheria, and acellular pertussis (Tdap/Td) vaccine.** / Consult your health care provider. Pregnant women should receive 1 dose of Tdap vaccine during each pregnancy. 1 dose of Td every 10 years.  Varicella vaccine.** / Consult your health care provider. Pregnant females who do not have evidence of immunity should receive the first dose after pregnancy.  Zoster vaccine.** / 1 dose for adults aged 30 years or older.  Measles, mumps, rubella (MMR) vaccine.** / You need at least 1 dose of MMR if you were born in 1957 or later. You may also need a second dose. For females of childbearing age, rubella immunity should be determined. If there is no evidence of immunity, females who are not pregnant should be vaccinated. If there is no evidence of immunity, females who are pregnant should delay immunization until after pregnancy.  Pneumococcal 13-valent conjugate (PCV13) vaccine.** / Consult your health care provider.  Pneumococcal polysaccharide (PPSV23) vaccine.** / 1 to 2 doses if you smoke cigarettes or if you have certain conditions.  Meningococcal vaccine.** /  Consult your health care provider.  Hepatitis A vaccine.** / Consult your health care provider.  Hepatitis B vaccine.** / Consult your health care provider.  Haemophilus influenzae type b (Hib) vaccine.** / Consult your health care provider. Ages 64 years and over  Blood pressure check.** / Every year.  Lipid and cholesterol check.** / Every 5 years beginning at age 23 years.  Lung cancer screening. / Every year if you  are aged 16-80 years and have a 30-pack-year history of smoking and currently smoke or have quit within the past 15 years. Yearly screening is stopped once you have quit smoking for at least 15 years or develop a health problem that would prevent you from having lung cancer treatment.  Clinical breast exam.** / Every year after age 74 years.  BRCA-related cancer risk assessment.** / For women who have family members with a BRCA-related cancer (breast, ovarian, tubal, or peritoneal cancers).  Mammogram.** / Every year beginning at age 44 years and continuing for as long as you are in good health. Consult with your health care provider.  Pap test.** / Every 3 years starting at age 58 years through age 22 or 39 years with 3 consecutive normal Pap tests. Testing can be stopped between 65 and 70 years with 3 consecutive normal Pap tests and no abnormal Pap or HPV tests in the past 10 years.  HPV screening.** / Every 3 years from ages 64 years through ages 70 or 61 years with a history of 3 consecutive normal Pap tests. Testing can be stopped between 65 and 70 years with 3 consecutive normal Pap tests and no abnormal Pap or HPV tests in the past 10 years.  Fecal occult blood test (FOBT) of stool. / Every year beginning at age 40 years and continuing until age 27 years. You may not need to do this test if you get a colonoscopy every 10 years.  Flexible sigmoidoscopy or colonoscopy.** / Every 5 years for a flexible sigmoidoscopy or every 10 years for a colonoscopy beginning at age 7 years and continuing until age 32 years.  Hepatitis C blood test.** / For all people born from [birth year] through 1964 and any individual with known risks for hepatitis C.  Osteoporosis screening.** / A one-time screening for women ages 30 years and over and women at risk for fractures or osteoporosis.  Skin self-exam. / Monthly.  Influenza vaccine. / Every year.  Tetanus, diphtheria, and acellular pertussis (Tdap/Td)  vaccine.** / 1 dose of Td every 10 years.  Varicella vaccine.** / Consult your health care provider.  Zoster vaccine.** / 1 dose for adults aged 35 years or older.  Pneumococcal 13-valent conjugate (PCV13) vaccine.** / Consult your health care provider.  Pneumococcal polysaccharide (PPSV23) vaccine.** / 1 dose for all adults aged 46 years and older.  Meningococcal vaccine.** / Consult your health care provider.  Hepatitis A vaccine.** / Consult your health care provider.  Hepatitis B vaccine.** / Consult your health care provider.  Haemophilus influenzae type b (Hib) vaccine.** / Consult your health care provider. ** Family history and personal history of risk and conditions may change your health care provider's recommendations.   This information is not intended to replace advice given to you by your health care provider. Make sure you discuss any questions you have with your health care provider.   Document Released: 08/22/2001 Document Revised: 07/17/2014 Document Reviewed: 11/21/2010 Elsevier Interactive Patient Education [employer].

## 2015-09-21 NOTE — Progress Notes (Signed)
Subjective:     [name] is a 60 y.o. female and is here for a comprehensive physical exam. The patient reports problems - swelling with the norvasc 10 -- otherwise no complaints.  Social History   Social History  . Marital Status: Divorced    Spouse Name: N/A  . Number of Children: N/A  . Years of Education: N/A   Occupational History  . homehealth    Social History Main Topics  . Smoking status: Never Smoker   . Smokeless tobacco: Never Used  . Alcohol Use: No     Comment: 3 times a week, states hx of heavy use 15 years ago  . Drug Use: No  . Sexual Activity:    Partners: Male   Other Topics Concern  . Not on file   Social History Narrative   Exercise--- gym 6x a week   Health Maintenance  Topic Date Due  . INFLUENZA VACCINE  02/08/2016  . MAMMOGRAM  09/09/2016  . DEXA SCAN  09/10/2016  . PAP SMEAR  05/29/2017  . COLONOSCOPY  11/03/2017  . TETANUS/TDAP  05/29/2024  . Hepatitis C Screening  Completed  . HIV Screening  Completed    The following portions of the patient's history were reviewed and updated as appropriate:  She  has a past medical history of Hypertension; Anxiety; Depression; Substance abuse; Cancer ([place]) (04/2014); and TIA (transient ischemic attack). She  does not have any pertinent problems on file. She  has past surgical history that includes Nose surgery; Breast enhancement surgery; Bunionectomy; and Tonsillectomy. Her family history includes Alzheimer's disease in her mother; Hyperlipidemia in her father; Hypertension in her mother; Kidney disease in her mother; Stroke in her paternal grandmother. She  reports that she has never smoked. She has never used smokeless tobacco. She reports that she does not drink alcohol or use illicit drugs. She has a current medication list which includes the following prescription(s): alendronate, amlodipine, aspirin, atorvastatin, coq10, lisinopril, metoprolol succinate, fish oil, and  hydrochlorothiazide. Current Outpatient Prescriptions on File Prior to Visit  Medication Sig Dispense Refill  . amLODipine (NORVASC) 10 MG tablet Take 1 tablet (10 mg total) by mouth daily. 90 tablet 0  . atorvastatin (LIPITOR) 20 MG tablet Take 1 tablet (20 mg total) by mouth daily at 6 PM. 30 tablet 2  . lisinopril (PRINIVIL,ZESTRIL) 40 MG tablet Take 1 tablet (40 mg total) by mouth daily. 90 tablet 0  . metoprolol succinate (TOPROL-XL) 50 MG 24 hr tablet TAKE ONE TABLET BY MOUTH DAILY WITH OR IMMEDIATELY FOLLOWING A MEAL. 90 tablet 0   No current facility-administered medications on file prior to visit.   She is allergic to codeine..  Review of Systems Review of Systems  Constitutional: Negative for activity change, appetite change and fatigue.  HENT: Negative for hearing loss, congestion, tinnitus and ear discharge.  dentist [id number]: Negative for visual disturbance (see optho q1y -- vision corrected to 20/20 with glasses).  Respiratory: Negative for cough, chest tightness and shortness of breath.   Cardiovascular: Negative for chest pain, palpitations and leg swelling.  Gastrointestinal: Negative for abdominal pain, diarrhea, constipation and abdominal distention.  Genitourinary: Negative for urgency, frequency, decreased urine volume and difficulty urinating.  Musculoskeletal: Negative for back pain, arthralgias and gait problem.  Skin: Negative for color change, pallor and rash.  Neurological: Negative for dizziness, light-headedness, numbness and headaches.  Hematological: Negative for adenopathy. Does not bruise/bleed easily.  Psychiatric/Behavioral: Negative for suicidal ideas, confusion, sleep disturbance, self-injury, dysphoric  mood, decreased concentration and agitation.       Objective:    BP 140/89 mmHg  Pulse 68  Temp(Src) 97.9 F (36.6 C) (Oral)  Ht _0  (1.549 m)  Wt 117 lb (53.071 kg)  BMI 22.12 kg/m2  SpO2 100% General appearance: alert, cooperative,  appears stated age and no distress Head: Normocephalic, without obvious abnormality, atraumatic Eyes: conjunctivae/corneas clear. PERRL, EOM's intact. Fundi benign. Ears: normal TM's and external ear canals both ears Nose: Nares normal. Septum midline. Mucosa normal. No drainage or sinus tenderness. Throat: lips, mucosa, and tongue normal; teeth and gums normal Neck: no adenopathy, no carotid bruit, no JVD, supple, symmetrical, trachea midline and thyroid not enlarged, symmetric, no tenderness/mass/nodules Back: symmetric, no curvature. ROM normal. No CVA tenderness. Lungs: clear to auscultation bilaterally Breasts: normal appearance, no masses or tenderness Heart: regular rate and rhythm, S1, S2 normal, no murmur, click, rub or gallop Abdomen: soft, non-tender; bowel sounds normal; no masses,  no organomegaly Pelvic: deferred Extremities: edema +1 pitting edema in both legs Pulses: 2+ and symmetric Skin: Skin color, texture, turgor normal. No rashes or lesions Lymph nodes: Cervical, supraclavicular, and axillary nodes normal. Neurologic: Alert and oriented X 3, normal strength and tone. Normal symmetric reflexes. Normal coordination and gait Psych- no depression, no anxiety      Assessment:    Healthy female exam.      Plan:    ghm utd Check labs See After Visit Summary for Counseling Recommendations    1. Bilateral edema of lower extremity Compression socks Elevated legs - hydrochlorothiazide (HYDRODIURIL) 25 MG tablet; Take 1 tablet (25 mg total) by mouth daily.  Dispense: 30 tablet; Refill: 11  2. Essential hypertension Stable con't lisinopril and norvasc Add hctz - CBC with Differential/Platelet - POCT urinalysis dipstick  3. Hyperlipidemia Check labs Pt off meds - Lipid panel - Comp Met (CMET)  4. Preventative health care See above - CBC with Differential/Platelet - Lipid panel - TSH - POCT urinalysis dipstick - Comp Met (CMET)  5. Osteopenia   -  alendronate (FOSAMAX) 70 MG tablet; Take 1 tablet (70 mg total) by mouth once a week. Take with a full glass of water on an empty stomach.  Dispense: 12 tablet; Refill: 3

## 2015-09-21 NOTE — Progress Notes (Signed)
Pre visit review using our clinic review tool, if applicable. No additional management support is needed unless otherwise documented below in the visit note. 

## 2015-09-22 ENCOUNTER — Other Ambulatory Visit: Payer: Self-pay | Admitting: Family Medicine

## 2015-09-22 ENCOUNTER — Encounter: Payer: Self-pay | Admitting: Family Medicine

## 2015-09-23 LAB — URINE CULTURE: Colony Count: 30000

## 2015-10-01 ENCOUNTER — Ambulatory Visit: Payer: BLUE CROSS/BLUE SHIELD

## 2015-10-06 ENCOUNTER — Ambulatory Visit (INDEPENDENT_AMBULATORY_CARE_PROVIDER_SITE_OTHER): Payer: BLUE CROSS/BLUE SHIELD | Admitting: Physician Assistant

## 2015-10-06 ENCOUNTER — Encounter: Payer: Self-pay | Admitting: Physician Assistant

## 2015-10-06 VITALS — BP 126/68 | HR 70 | Temp 97.9°F | Ht 61.0 in | Wt 119.8 lb

## 2015-10-06 DIAGNOSIS — R197 Diarrhea, unspecified: Secondary | ICD-10-CM

## 2015-10-06 DIAGNOSIS — A09 Infectious gastroenteritis and colitis, unspecified: Secondary | ICD-10-CM | POA: Diagnosis not present

## 2015-10-06 MED ORDER — CIPROFLOXACIN HCL 500 MG PO TABS: 500.0000 mg | ORAL_TABLET | Freq: Two times a day (BID) | ORAL | Status: DC

## 2015-10-06 NOTE — Progress Notes (Signed)
Patient presents to clinic today c/o 2 weeks of nausea and diarrhea x 2 weeks. Endorses averaging about 6-7 bowel movements per day. Denies melena or hematochezia. Denies vomiting or abdominal pain. Denies fever, chills. Denies recent travel. Denies abnormal food or water source. Endorses she has only had 1 stool today.   Past Medical History  Diagnosis Date  . Hypertension   . Anxiety   . Depression   . Substance abuse   . Cancer ([place]) 04/2014    squamous cell carcinoma L inner thigh  . TIA (transient ischemic attack)     Current Outpatient Prescriptions on File Prior to Visit  Medication Sig Dispense Refill  . alendronate (FOSAMAX) 70 MG tablet Take 1 tablet (70 mg total) by mouth once a week. Take with a full glass of water on an empty stomach. 12 tablet 3  . amLODipine (NORVASC) 10 MG tablet Take 1 tablet (10 mg total) by mouth daily. 90 tablet 0  . aspirin 81 MG tablet Take 81 mg by mouth daily.    . Coenzyme Q10 (COQ10) 100 MG CAPS Take 1 tablet by mouth daily.     Marland Kitchen lisinopril (PRINIVIL,ZESTRIL) 40 MG tablet Take 1 tablet (40 mg total) by mouth daily. 90 tablet 0  . metoprolol succinate (TOPROL-XL) 50 MG 24 hr tablet TAKE ONE TABLET BY MOUTH DAILY WITH OR IMMEDIATELY FOLLOWING A MEAL. (Patient taking differently: TAKE 50 MG BY MOUTH DAILY WITH OR IMMEDIATELY FOLLOWING A MEAL.) 90 tablet 0  . Omega-3 Fatty Acids (FISH OIL) 1200 MG CAPS Take 1,200 mg by mouth daily.      No current facility-administered medications on file prior to visit.    Allergies  Allergen Reactions  . Codeine     REACTION: insomnia and anxious    Family History  Problem Relation Age of Onset  . Hypertension Mother   . Kidney disease Mother     dialysis  . Alzheimer's disease Mother   . Hyperlipidemia Father   . Stroke Paternal Grandmother     Social History   Social History  . Marital Status: Divorced    Spouse Name: N/A  . Number of Children: N/A  . Years of Education: N/A    Occupational History  . homehealth    Social History Main Topics  . Smoking status: Never Smoker   . Smokeless tobacco: Never Used  . Alcohol Use: No     Comment: 3 times a week, states hx of heavy use 15 years ago  . Drug Use: No  . Sexual Activity:    Partners: Male   Other Topics Concern  . None   Social History Narrative   Exercise--- gym 6x a week   Review of Systems - See HPI.  All other ROS are negative.  BP 126/68 mmHg  Pulse 70  Temp(Src) 97.9 F (36.6 C) (Oral)  Ht _0  (1.549 m)  Wt 119 lb 12.8 oz (54.341 kg)  BMI 22.65 kg/m2  SpO2 99%  Physical Exam  Constitutional: She is oriented to person, place, and time and well-developed, well-nourished, and in no distress.  HENT:  Head: Normocephalic and atraumatic.  Eyes: Conjunctivae are normal.  Cardiovascular: Normal rate, regular rhythm, normal heart sounds and intact distal pulses.   Pulmonary/Chest: Effort normal and breath sounds normal. No respiratory distress. She has no wheezes. She has no rales. She exhibits no tenderness.  Abdominal: Soft. Bowel sounds are normal. She exhibits no distension and no mass. There is no tenderness. There  is no rebound and no guarding.  Neurological: She is alert and oriented to person, place, and time.  Skin: Skin is warm and dry. No rash noted.  Psychiatric: Affect normal.  Vitals reviewed.   Recent Results (from the past 2160 hour(s))  CBC with Differential/Platelet     Status: Abnormal   Collection Time: 09/21/15 11:50 AM  Result Value Ref Range   WBC 6.8 4.0 - 10.5 K/uL   RBC 3.78 (L) 3.87 - 5.11 Mil/uL   Hemoglobin 13.4 12.0 - 15.0 g/dL   HCT 39.3 36.0 - 46.0 %   MCV 103.9 (H) 78.0 - 100.0 fl   MCHC 34.2 30.0 - 36.0 g/dL   RDW 12.8 11.5 - 15.5 %   Platelets 276.0 150.0 - 400.0 K/uL   Neutrophils Relative % 67.2 43.0 - 77.0 %   Lymphocytes Relative 20.6 12.0 - 46.0 %   Monocytes Relative 10.1 3.0 - 12.0 %   Eosinophils Relative 1.7 0.0 - 5.0 %   Basophils  Relative 0.4 0.0 - 3.0 %   Neutro Abs 4.6 1.4 - 7.7 K/uL   Lymphs Abs 1.4 0.7 - 4.0 K/uL   Monocytes Absolute 0.7 0.1 - 1.0 K/uL   Eosinophils Absolute 0.1 0.0 - 0.7 K/uL   Basophils Absolute 0.0 0.0 - 0.1 K/uL  Lipid panel     Status: Abnormal   Collection Time: 09/21/15 11:50 AM  Result Value Ref Range   Cholesterol 193 0 - 200 mg/dL    Comment: ATP III Classification       Desirable:  < 200 mg/dL               Borderline High:  200 - 239 mg/dL          High:  > = 240 mg/dL   Triglycerides 72.0 0.0 - 149.0 mg/dL    Comment: Normal:  <150 mg/dLBorderline High:  150 - 199 mg/dL   HDL 69.80 >39.00 mg/dL   VLDL 14.4 0.0 - 40.0 mg/dL   LDL Cholesterol 108 (H) 0 - 99 mg/dL   Total CHOL/HDL Ratio 3     Comment:                Men          Women1/2 Average Risk     3.4          3.3Average Risk          5.0          4.42X Average Risk          9.6          7.13X Average Risk          15.0          11.0                       NonHDL 122.78     Comment: NOTE:  Non-HDL goal should be 30 mg/dL higher than patient's LDL goal (i.e. LDL goal of < 70 mg/dL, would have non-HDL goal of < 100 mg/dL)  TSH     Status: None   Collection Time: 09/21/15 11:50 AM  Result Value Ref Range   TSH 0.97 0.35 - 4.50 uIU/mL  Comp Met (CMET)     Status: Abnormal   Collection Time: 09/21/15 11:50 AM  Result Value Ref Range   Sodium 134 (L) 135 - 145 mEq/L   Potassium 4.6 3.5 - 5.1 mEq/L   Chloride 99  96 - 112 mEq/L   CO2 20 19 - 32 mEq/L   Glucose, Bld 94 70 - 99 mg/dL   BUN 9 6 - 23 mg/dL   Creatinine, Ser 0.75 0.40 - 1.20 mg/dL   Total Bilirubin 0.5 0.2 - 1.2 mg/dL   Alkaline Phosphatase 67 39 - 117 U/L   AST 55 (H) 0 - 37 U/L   ALT 27 0 - 35 U/L   Total Protein 8.0 6.0 - 8.3 g/dL   Albumin 4.8 3.5 - 5.2 g/dL   Calcium 10.4 8.4 - 10.5 mg/dL   GFR 84.15 >60.00 mL/min  POCT urinalysis dipstick     Status: Abnormal   Collection Time: 09/21/15  4:49 PM  Result Value Ref Range   Color, UA yellow    Clarity,  UA clear    Glucose, UA Neg    Bilirubin, UA Neg    Ketones, UA Neg    Spec Grav, UA 1.020    Blood, UA Neg    pH, UA 6.0    Protein, UA Neg    Urobilinogen, UA 0.2    Nitrite, UA neg    Leukocytes, UA Trace (A) Negative  Urine culture     Status: None   Collection Time: 09/21/15  4:56 PM  Result Value Ref Range   Colony Count 30,000 COLONIES/ML    Organism ID, Bacteria Multiple bacterial morphotypes present, none    Organism ID, Bacteria predominant. Suggest appropriate recollection if     Organism ID, Bacteria clinically indicated.   CBC w/Diff     Status: Abnormal   Collection Time: 10/06/15  4:08 PM  Result Value Ref Range   WBC 10.9 (H) 4.0 - 10.5 K/uL   RBC 3.93 3.87 - 5.11 Mil/uL   Hemoglobin 13.7 12.0 - 15.0 g/dL   HCT 39.4 36.0 - 46.0 %   MCV 100.3 (H) 78.0 - 100.0 fl   MCHC 34.8 30.0 - 36.0 g/dL   RDW 12.2 11.5 - 15.5 %   Platelets 262.0 150.0 - 400.0 K/uL   Neutrophils Relative % 70.2 43.0 - 77.0 %   Lymphocytes Relative 16.7 12.0 - 46.0 %   Monocytes Relative 10.4 3.0 - 12.0 %   Eosinophils Relative 1.7 0.0 - 5.0 %   Basophils Relative 1.0 0.0 - 3.0 %   Neutro Abs 7.7 1.4 - 7.7 K/uL   Lymphs Abs 1.8 0.7 - 4.0 K/uL   Monocytes Absolute 1.1 (H) 0.1 - 1.0 K/uL   Eosinophils Absolute 0.2 0.0 - 0.7 K/uL   Basophils Absolute 0.1 0.0 - 0.1 K/uL  Basic Metabolic Panel (BMET)     Status: Abnormal   Collection Time: 10/06/15  4:08 PM  Result Value Ref Range   Sodium 115 (LL) 135 - 145 mEq/L   Potassium 4.5 3.5 - 5.1 mEq/L   Chloride 80 (L) 96 - 112 mEq/L   CO2 27 19 - 32 mEq/L   Glucose, Bld 113 (H) 70 - 99 mg/dL   BUN 6 6 - 23 mg/dL   Creatinine, Ser 0.79 0.40 - 1.20 mg/dL   Calcium 10.0 8.4 - 10.5 mg/dL   GFR 79.24 >60.00 mL/min  Ova and parasite examination     Status: None   Collection Time: 10/07/15  8:09 AM  Result Value Ref Range   OP No Ova or Parasites Seen    Stool culture     Status: None (Preliminary result)   Collection Time: 10/07/15  8:09 AM    Result Value Ref  Range   Preliminary Report No Suspicious Colonies, Continuing to Hold     Comment: Reduced Normal Flora present   Preliminary Report Moderate Yeast   C. difficile GDH and Toxin A/B     Status: None   Collection Time: 10/07/15  8:09 AM  Result Value Ref Range   C. difficile GDH Not Detected    C. difficile Toxin A/B Not Detected     Comment: No Toxigenic C. difficile Detected   Source Stool   Basic metabolic panel     Status: Abnormal   Collection Time: 10/07/15  2:50 PM  Result Value Ref Range   Sodium 116 (LL) 135 - 145 mmol/L    Comment: CRITICAL RESULT CALLED TO, READ BACK BY AND VERIFIED WITH: CALLED TO KELLY CONNOR RN AT 1543 BY S.ROY ON [id number] REPEATED TO VERIFY    Potassium 4.0 3.5 - 5.1 mmol/L   Chloride 78 (L) 101 - 111 mmol/L   CO2 25 22 - 32 mmol/L   Glucose, Bld 111 (H) 65 - 99 mg/dL   BUN 9 6 - 20 mg/dL   Creatinine, Ser 0.84 0.44 - 1.00 mg/dL   Calcium 9.7 8.9 - 10.3 mg/dL   GFR calc non Af Amer >60 >60 mL/min   GFR calc Af Amer >60 >60 mL/min    Comment: (NOTE) The eGFR has been calculated using the CKD EPI equation. This calculation has not been validated in all clinical situations. eGFR's persistently <60 mL/min signify possible Chronic Kidney Disease.    Anion gap 13 5 - 15  CBC with Differential/Platelet     Status: Abnormal   Collection Time: 10/07/15  2:50 PM  Result Value Ref Range   WBC 9.8 4.0 - 10.5 K/uL   RBC 3.99 3.87 - 5.11 MIL/uL   Hemoglobin 14.2 12.0 - 15.0 g/dL   HCT 37.9 36.0 - 46.0 %   MCV 95.0 78.0 - 100.0 fL   MCH 35.6 (H) 26.0 - 34.0 pg   MCHC 37.5 (H) 30.0 - 36.0 g/dL    Comment: RULED OUT INTERFERING SUBSTANCES   RDW 10.4 (L) 11.5 - 15.5 %   Platelets 251 150 - 400 K/uL   Neutrophils Relative % 74 %   Lymphocytes Relative 14 %   Monocytes Relative 11 %   Eosinophils Relative 1 %   Basophils Relative 0 %   Neutro Abs 7.2 1.7 - 7.7 K/uL   Lymphs Abs 1.4 0.7 - 4.0 K/uL   Monocytes Absolute 1.1 (H) 0.1 - 1.0  K/uL   Eosinophils Absolute 0.1 0.0 - 0.7 K/uL   Basophils Absolute 0.0 0.0 - 0.1 K/uL   RBC Morphology STOMATOCYTES   C difficile quick scan w PCR reflex     Status: None   Collection Time: 10/07/15  3:30 PM  Result Value Ref Range   C Diff antigen NEGATIVE NEGATIVE   C Diff toxin NEGATIVE NEGATIVE   C Diff interpretation Negative for toxigenic C. difficile   Basic metabolic panel     Status: Abnormal   Collection Time: 10/07/15  8:02 PM  Result Value Ref Range   Sodium 122 (L) 135 - 145 mmol/L   Potassium 3.5 3.5 - 5.1 mmol/L   Chloride 89 (L) 101 - 111 mmol/L   CO2 22 22 - 32 mmol/L   Glucose, Bld 91 65 - 99 mg/dL   BUN 7 6 - 20 mg/dL   Creatinine, Ser 0.58 0.44 - 1.00 mg/dL   Calcium 8.5 (L) 8.9 - 10.3  mg/dL   GFR calc non Af Amer >60 >60 mL/min   GFR calc Af Amer >60 >60 mL/min    Comment: (NOTE) The eGFR has been calculated using the CKD EPI equation. This calculation has not been validated in all clinical situations. eGFR's persistently <60 mL/min signify possible Chronic Kidney Disease.    Anion gap 11 5 - 15  Hepatic function panel     Status: None   Collection Time: 10/07/15  8:02 PM  Result Value Ref Range   Total Protein 6.7 6.5 - 8.1 g/dL   Albumin 4.0 3.5 - 5.0 g/dL   AST 32 15 - 41 U/L   ALT 23 14 - 54 U/L   Alkaline Phosphatase 70 38 - 126 U/L   Total Bilirubin 0.8 0.3 - 1.2 mg/dL   Bilirubin, Direct 0.1 0.1 - 0.5 mg/dL   Indirect Bilirubin 0.7 0.3 - 0.9 mg/dL  Lipase, blood     Status: None   Collection Time: 10/07/15  8:02 PM  Result Value Ref Range   Lipase 29 11 - 51 U/L  Troponin I     Status: None   Collection Time: 10/07/15  8:02 PM  Result Value Ref Range   Troponin I <0.03 <0.031 ng/mL    Comment:        NO INDICATION OF MYOCARDIAL INJURY.   Ethanol     Status: Abnormal   Collection Time: 10/07/15  8:02 PM  Result Value Ref Range   Alcohol, Ethyl (B) 48 (H) <5 mg/dL    Comment:        LOWEST DETECTABLE LIMIT FOR SERUM ALCOHOL IS 5  mg/dL FOR MEDICAL PURPOSES ONLY   TSH     Status: None   Collection Time: 10/07/15  8:31 PM  Result Value Ref Range   TSH 1.386 0.350 - 4.500 uIU/mL  Lactic acid, plasma     Status: None   Collection Time: 10/07/15  8:34 PM  Result Value Ref Range   Lactic Acid, Venous 1.2 0.5 - 2.0 mmol/L  Urine rapid drug screen (hosp performed)     Status: None   Collection Time: 10/07/15  9:39 PM  Result Value Ref Range   Opiates NONE DETECTED NONE DETECTED   Cocaine NONE DETECTED NONE DETECTED   Benzodiazepines NONE DETECTED NONE DETECTED   Amphetamines NONE DETECTED NONE DETECTED   Tetrahydrocannabinol NONE DETECTED NONE DETECTED   Barbiturates NONE DETECTED NONE DETECTED    Comment:        DRUG SCREEN FOR MEDICAL PURPOSES ONLY.  IF CONFIRMATION IS NEEDED FOR ANY PURPOSE, NOTIFY LAB WITHIN 5 DAYS.        LOWEST DETECTABLE LIMITS FOR URINE DRUG SCREEN Drug Class       Cutoff (ng/mL) Amphetamine      1000 Barbiturate      200 Benzodiazepine   131 Tricyclics       438 Opiates          300 Cocaine          300 THC              50   Urinalysis, Routine w reflex microscopic (not at [facility])     Status: Abnormal   Collection Time: 10/07/15  9:39 PM  Result Value Ref Range   Color, Urine YELLOW YELLOW   APPearance CLEAR CLEAR   Specific Gravity, Urine 1.003 (L) 1.005 - 1.030   pH 6.5 5.0 - 8.0   Glucose, UA NEGATIVE NEGATIVE mg/dL  Hgb urine dipstick NEGATIVE NEGATIVE   Bilirubin Urine NEGATIVE NEGATIVE   Ketones, ur NEGATIVE NEGATIVE mg/dL   Protein, ur NEGATIVE NEGATIVE mg/dL   Nitrite NEGATIVE NEGATIVE   Leukocytes, UA TRACE (A) NEGATIVE  Urine microscopic-add on     Status: Abnormal   Collection Time: 10/07/15  9:39 PM  Result Value Ref Range   Squamous Epithelial / LPF 0-5 (A) NONE SEEN   WBC, UA 0-5 0 - 5 WBC/hpf   RBC / HPF NONE SEEN 0 - 5 RBC/hpf   Bacteria, UA NONE SEEN NONE SEEN  Sodium, urine, random     Status: None   Collection Time: 10/07/15  9:39 PM  Result Value  Ref Range   Sodium, Ur 31 mmol/L    Comment: Performed at [facility]  Creatinine, urine, random     Status: None   Collection Time: 10/07/15  9:39 PM  Result Value Ref Range   Creatinine, Urine 10.18 mg/dL    Comment: Performed at [facility]  Lactic acid, plasma     Status: None   Collection Time: 10/07/15 11:25 PM  Result Value Ref Range   Lactic Acid, Venous 1.4 0.5 - 2.0 mmol/L  CBC     Status: Abnormal   Collection Time: 10/08/15  5:15 AM  Result Value Ref Range   WBC 6.6 4.0 - 10.5 K/uL   RBC 3.41 (L) 3.87 - 5.11 MIL/uL   Hemoglobin 12.0 12.0 - 15.0 g/dL   HCT 33.2 (L) 36.0 - 46.0 %   MCV 97.4 78.0 - 100.0 fL   MCH 35.2 (H) 26.0 - 34.0 pg   MCHC 36.1 (H) 30.0 - 36.0 g/dL   RDW 11.7 11.5 - 15.5 %   Platelets 234 150 - 400 K/uL  Basic metabolic panel     Status: Abnormal   Collection Time: 10/09/15  9:38 AM  Result Value Ref Range   Sodium 127 (L) 135 - 145 mmol/L   Potassium 3.7 3.5 - 5.1 mmol/L   Chloride 94 (L) 101 - 111 mmol/L   CO2 24 22 - 32 mmol/L   Glucose, Bld 114 (H) 65 - 99 mg/dL   BUN 7 6 - 20 mg/dL   Creatinine, Ser 0.72 0.44 - 1.00 mg/dL   Calcium 9.1 8.9 - 10.3 mg/dL   GFR calc non Af Amer >60 >60 mL/min   GFR calc Af Amer >60 >60 mL/min    Comment: (NOTE) The eGFR has been calculated using the CKD EPI equation. This calculation has not been validated in all clinical situations. eGFR's persistently <60 mL/min signify possible Chronic Kidney Disease.    Anion gap 9 5 - 15    Assessment/Plan: Diarrhea > 1 week symptoms. Presumed infectious. Will begin Cipro empirically. Start Probiotic and BRAT diet. Will obtain CBC, CMP and stool studies.

## 2015-10-06 NOTE — Progress Notes (Signed)
Pre visit review using our clinic review tool, if applicable. No additional management support is needed unless otherwise documented below in the visit note. 

## 2015-10-06 NOTE — Patient Instructions (Signed)
Your exam looks good today. Bowel sounds are normal and no significant tenderness was noted. You may have fought off this infection on your own. Start a probiotic (Align, Culturellle, Digestive Advantage) to help populate your gut with good bacteria. Stay hydrated. Eat a bland diet (following the instructions below).  Go to the lab for blood work and stool studies. I will call with your results and we will go from there.  I will start a low dose antibiotic for now since symptoms have been present for greater than 1 week. If results normal we will stop.

## 2015-10-07 ENCOUNTER — Telehealth: Payer: Self-pay | Admitting: *Deleted

## 2015-10-07 ENCOUNTER — Observation Stay (HOSPITAL_BASED_OUTPATIENT_CLINIC_OR_DEPARTMENT_OTHER)
Admission: EM | Admit: 2015-10-07 | Discharge: 2015-10-09 | Disposition: A | Discharge status: Home / Self Care | Payer: BLUE CROSS/BLUE SHIELD | Attending: Internal Medicine | Admitting: Internal Medicine

## 2015-10-07 ENCOUNTER — Encounter (HOSPITAL_BASED_OUTPATIENT_CLINIC_OR_DEPARTMENT_OTHER): Payer: Self-pay

## 2015-10-07 DIAGNOSIS — F1010 Alcohol abuse, uncomplicated: Secondary | ICD-10-CM

## 2015-10-07 DIAGNOSIS — E871 Hypo-osmolality and hyponatremia: Secondary | ICD-10-CM | POA: Diagnosis not present

## 2015-10-07 DIAGNOSIS — I10 Essential (primary) hypertension: Secondary | ICD-10-CM | POA: Diagnosis not present

## 2015-10-07 DIAGNOSIS — Z7983 Long term (current) use of bisphosphonates: Secondary | ICD-10-CM | POA: Diagnosis not present

## 2015-10-07 DIAGNOSIS — F10239 Alcohol dependence with withdrawal, unspecified: Secondary | ICD-10-CM | POA: Insufficient documentation

## 2015-10-07 DIAGNOSIS — Z7982 Long term (current) use of aspirin: Secondary | ICD-10-CM | POA: Diagnosis not present

## 2015-10-07 DIAGNOSIS — Z79899 Other long term (current) drug therapy: Secondary | ICD-10-CM | POA: Insufficient documentation

## 2015-10-07 DIAGNOSIS — R112 Nausea with vomiting, unspecified: Secondary | ICD-10-CM | POA: Insufficient documentation

## 2015-10-07 DIAGNOSIS — Z8673 Personal history of transient ischemic attack (TIA), and cerebral infarction without residual deficits: Secondary | ICD-10-CM | POA: Insufficient documentation

## 2015-10-07 DIAGNOSIS — R197 Diarrhea, unspecified: Secondary | ICD-10-CM | POA: Insufficient documentation

## 2015-10-07 DIAGNOSIS — F1011 Alcohol abuse, in remission: Secondary | ICD-10-CM | POA: Diagnosis present

## 2015-10-07 DIAGNOSIS — F10939 Alcohol use, unspecified with withdrawal, unspecified: Secondary | ICD-10-CM

## 2015-10-07 DIAGNOSIS — F419 Anxiety disorder, unspecified: Secondary | ICD-10-CM | POA: Diagnosis not present

## 2015-10-07 DIAGNOSIS — F32A Depression, unspecified: Secondary | ICD-10-CM | POA: Diagnosis present

## 2015-10-07 LAB — LACTIC ACID, PLASMA: Lactic Acid, Venous: 1.2 mmol/L (ref 0.5–2.0)

## 2015-10-07 LAB — RAPID URINE DRUG SCREEN, HOSP PERFORMED
Amphetamines: NOT DETECTED
BARBITURATES: NOT DETECTED
BENZODIAZEPINES: NOT DETECTED
Cocaine: NOT DETECTED
Opiates: NOT DETECTED
Tetrahydrocannabinol: NOT DETECTED

## 2015-10-07 LAB — CBC WITH DIFFERENTIAL/PLATELET
BASOS ABS: 0.1 10*3/uL (ref 0.0–0.1)
BASOS PCT: 0 %
Basophils Absolute: 0 10*3/uL (ref 0.0–0.1)
Basophils Relative: 1 % (ref 0.0–3.0)
EOS PCT: 1.7 % (ref 0.0–5.0)
EOS PCT: 1 %
Eosinophils Absolute: 0.2 10*3/uL (ref 0.0–0.7)
Eosinophils Absolute: 0.1 10*3/uL (ref 0.0–0.7)
HEMATOCRIT: 39.4 % (ref 36.0–46.0)
HEMATOCRIT: 37.9 % (ref 36.0–46.0)
HEMOGLOBIN: 13.7 g/dL (ref 12.0–15.0)
HEMOGLOBIN: 14.2 g/dL (ref 12.0–15.0)
LYMPHS PCT: 16.7 % (ref 12.0–46.0)
LYMPHS PCT: 14 %
Lymphs Abs: 1.8 10*3/uL (ref 0.7–4.0)
Lymphs Abs: 1.4 10*3/uL (ref 0.7–4.0)
MCH: 35.6 pg — AB (ref 26.0–34.0)
MCHC: 34.8 g/dL (ref 30.0–36.0)
MCHC: 37.5 g/dL — ABNORMAL HIGH (ref 30.0–36.0)
MCV: 100.3 fl — AB (ref 78.0–100.0)
MCV: 95 fL (ref 78.0–100.0)
MONO ABS: 1.1 10*3/uL — AB (ref 0.1–1.0)
MONOS PCT: 10.4 % (ref 3.0–12.0)
Monocytes Absolute: 1.1 10*3/uL — ABNORMAL HIGH (ref 0.1–1.0)
Monocytes Relative: 11 %
NEUTROS PCT: 74 %
Neutro Abs: 7.7 10*3/uL (ref 1.4–7.7)
Neutro Abs: 7.2 10*3/uL (ref 1.7–7.7)
Neutrophils Relative %: 70.2 % (ref 43.0–77.0)
Platelets: 262 10*3/uL (ref 150.0–400.0)
Platelets: 251 10*3/uL (ref 150–400)
RBC: 3.93 Mil/uL (ref 3.87–5.11)
RBC: 3.99 MIL/uL (ref 3.87–5.11)
RDW: 12.2 % (ref 11.5–15.5)
RDW: 10.4 % — ABNORMAL LOW (ref 11.5–15.5)
WBC: 10.9 10*3/uL — ABNORMAL HIGH (ref 4.0–10.5)
WBC: 9.8 10*3/uL (ref 4.0–10.5)

## 2015-10-07 LAB — C DIFFICILE QUICK SCREEN W PCR REFLEX
C Diff antigen: NEGATIVE
C Diff interpretation: NEGATIVE
C Diff toxin: NEGATIVE

## 2015-10-07 LAB — BASIC METABOLIC PANEL
ANION GAP: 11 (ref 5–15)
Anion gap: 13 (ref 5–15)
BUN: 6 mg/dL (ref 6–23)
BUN: 9 mg/dL (ref 6–20)
BUN: 7 mg/dL (ref 6–20)
CALCIUM: 10 mg/dL (ref 8.4–10.5)
CALCIUM: 8.5 mg/dL — AB (ref 8.9–10.3)
CHLORIDE: 80 meq/L — AB (ref 96–112)
CO2: 27 meq/L (ref 19–32)
CO2: 25 mmol/L (ref 22–32)
CO2: 22 mmol/L (ref 22–32)
CREATININE: 0.79 mg/dL (ref 0.40–1.20)
CREATININE: 0.84 mg/dL (ref 0.44–1.00)
CREATININE: 0.58 mg/dL (ref 0.44–1.00)
Calcium: 9.7 mg/dL (ref 8.9–10.3)
Chloride: 78 mmol/L — ABNORMAL LOW (ref 101–111)
Chloride: 89 mmol/L — ABNORMAL LOW (ref 101–111)
GFR calc Af Amer: >60 mL/min (ref >60)
GFR calc non Af Amer: >60 mL/min (ref >60)
GFR calc non Af Amer: >60 mL/min (ref >60)
GFR: 79.24 mL/min (ref >60.00)
GLUCOSE: 113 mg/dL — AB (ref 70–99)
GLUCOSE: 91 mg/dL (ref 65–99)
Glucose, Bld: 111 mg/dL — ABNORMAL HIGH (ref 65–99)
Potassium: 4.5 mEq/L (ref 3.5–5.1)
Potassium: 4 mmol/L (ref 3.5–5.1)
Potassium: 3.5 mmol/L (ref 3.5–5.1)
Sodium: 115 mEq/L — CL (ref 135–145)
Sodium: 116 mmol/L — CL (ref 135–145)
Sodium: 122 mmol/L — ABNORMAL LOW (ref 135–145)

## 2015-10-07 LAB — URINALYSIS, ROUTINE W REFLEX MICROSCOPIC
BILIRUBIN URINE: NEGATIVE
Glucose, UA: NEGATIVE mg/dL
HGB URINE DIPSTICK: NEGATIVE
KETONES UR: NEGATIVE mg/dL
Nitrite: NEGATIVE
PROTEIN: NEGATIVE mg/dL
Specific Gravity, Urine: 1.003 — ABNORMAL LOW (ref 1.005–1.030)
pH: 6.5 (ref 5.0–8.0)

## 2015-10-07 LAB — TSH: TSH: 1.386 u[IU]/mL (ref 0.350–4.500)

## 2015-10-07 LAB — URINE MICROSCOPIC-ADD ON
BACTERIA UA: NONE SEEN
RBC / HPF: NONE SEEN RBC/hpf (ref 0–5)

## 2015-10-07 LAB — LIPASE, BLOOD: Lipase: 29 U/L (ref 11–51)

## 2015-10-07 LAB — HEPATIC FUNCTION PANEL
ALT: 23 U/L (ref 14–54)
AST: 32 U/L (ref 15–41)
Albumin: 4 g/dL (ref 3.5–5.0)
Alkaline Phosphatase: 70 U/L (ref 38–126)
Bilirubin, Direct: 0.1 mg/dL (ref 0.1–0.5)
Indirect Bilirubin: 0.7 mg/dL (ref 0.3–0.9)
Total Bilirubin: 0.8 mg/dL (ref 0.3–1.2)
Total Protein: 6.7 g/dL (ref 6.5–8.1)

## 2015-10-07 LAB — TROPONIN I

## 2015-10-07 LAB — ETHANOL: Alcohol, Ethyl (B): 48 mg/dL — ABNORMAL HIGH (ref <5)

## 2015-10-07 MED ORDER — CIPROFLOXACIN HCL 500 MG PO TABS
500.0000 mg | ORAL_TABLET | Freq: Two times a day (BID) | ORAL | Status: DC
  Administered 2015-10-07 – 2015-10-08 (×2): 500 mg via ORAL
  Filled 2015-10-07 (×2): qty 1

## 2015-10-07 MED ORDER — ASPIRIN 81 MG PO TABS: 81.0000 mg | ORAL_TABLET | Freq: Every day | ORAL | Status: DC

## 2015-10-07 MED ORDER — AMLODIPINE BESYLATE 10 MG PO TABS
10.0000 mg | ORAL_TABLET | Freq: Every day | ORAL | Status: DC
  Administered 2015-10-07 – 2015-10-09 (×3): 10 mg via ORAL
  Filled 2015-10-07 (×3): qty 1

## 2015-10-07 MED ORDER — ASPIRIN EC 81 MG PO TBEC
81.0000 mg | DELAYED_RELEASE_TABLET | Freq: Every day | ORAL | Status: DC
  Administered 2015-10-07 – 2015-10-09 (×3): 81 mg via ORAL
  Filled 2015-10-07 (×3): qty 1

## 2015-10-07 MED ORDER — VITAMIN B-1 100 MG PO TABS
100.0000 mg | ORAL_TABLET | Freq: Every day | ORAL | Status: DC
  Filled 2015-10-07: qty 1

## 2015-10-07 MED ORDER — ZOLPIDEM TARTRATE 5 MG PO TABS
5.0000 mg | ORAL_TABLET | Freq: Once | ORAL | Status: AC
  Administered 2015-10-07: 5 mg via ORAL
  Filled 2015-10-07: qty 1

## 2015-10-07 MED ORDER — SODIUM CHLORIDE 0.9 % IV BOLUS (SEPSIS)
1000.0000 mL | Freq: Once | INTRAVENOUS | Status: AC
  Administered 2015-10-07: 1000 mL via INTRAVENOUS

## 2015-10-07 MED ORDER — SODIUM CHLORIDE 0.9 % IV SOLN
INTRAVENOUS | Status: AC
Start: 2015-10-07 — End: 2015-10-08
  Administered 2015-10-08: via INTRAVENOUS

## 2015-10-07 MED ORDER — ADULT MULTIVITAMIN W/MINERALS CH
1.0000 | ORAL_TABLET | Freq: Every day | ORAL | Status: DC
  Administered 2015-10-07 – 2015-10-09 (×3): 1 via ORAL
  Filled 2015-10-07 (×3): qty 1

## 2015-10-07 MED ORDER — VITAMIN B-1 100 MG PO TABS
100.0000 mg | ORAL_TABLET | Freq: Every day | ORAL | Status: DC
  Administered 2015-10-07 – 2015-10-09 (×3): 100 mg via ORAL
  Filled 2015-10-07 (×3): qty 1

## 2015-10-07 MED ORDER — ONDANSETRON HCL 4 MG PO TABS: 4.0000 mg | ORAL_TABLET | Freq: Four times a day (QID) | ORAL | Status: DC | PRN

## 2015-10-07 MED ORDER — SODIUM CHLORIDE 0.9 % IV SOLN
INTRAVENOUS | Status: AC
  Administered 2015-10-07: 17:00:00 via INTRAVENOUS

## 2015-10-07 MED ORDER — THIAMINE HCL 100 MG/ML IJ SOLN: 100.0000 mg | Freq: Every day | INTRAMUSCULAR | Status: DC

## 2015-10-07 MED ORDER — ONDANSETRON HCL 4 MG/2ML IJ SOLN: 4.0000 mg | Freq: Four times a day (QID) | INTRAMUSCULAR | Status: DC | PRN

## 2015-10-07 MED ORDER — THIAMINE HCL 100 MG/ML IJ SOLN
100.0000 mg | Freq: Every day | INTRAMUSCULAR | Status: DC
  Filled 2015-10-07 (×3): qty 1

## 2015-10-07 NOTE — Telephone Encounter (Signed)
Attempted to call patient 3 times on number listed.  Unable to reach patient.  Left a message on voicemail for call back.  Called father, as he is listed on patient's DPR.  Unable to reach him as well.  No message left.  Will continue to try to reach patient.

## 2015-10-07 NOTE — Telephone Encounter (Signed)
Patient returning your call confirm # as being [phone]

## 2015-10-07 NOTE — H&P (Signed)
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  Pt has been having diarrhea for 2 weeks nonbloody with vomiting.  Still drinking several beers a day.  Denies any abd pain or fever.  Denies any dizziness or weakness upon standing.  Pt found to have na level of 112.  Transferred from med center for hyponatremia.  Given on liter ivf bolus there.  Review of Systems:  Positive and negative as per HPI otherwise all other systems are negative  Past Medical History: Past Medical History  Diagnosis Date  . Hypertension   . Anxiety   . Depression   . Substance abuse   . Cancer ([place]) 04/2014    squamous cell carcinoma L inner thigh  . TIA (transient ischemic attack)    Past Surgical History  Procedure Laterality Date  . Nose surgery      Epistaxis  . Breast enhancement surgery    . Bunionectomy    . Tonsillectomy      Medications: Prior to Admission medications   Medication Sig Start Date End Date Taking? Authorizing Provider  alendronate (FOSAMAX) 70 MG tablet Take 1 tablet (70 mg total) by mouth once a week. Take with a full glass of water on an empty stomach. 09/21/15   Alferd Apa Lowne Chase, DO  amLODipine (NORVASC) 10 MG tablet Take 1 tablet (10 mg total) by mouth daily. 07/14/15   Mackie Pai, PA-C  aspirin 81 MG tablet Take 81 mg by mouth daily.    Historical Provider, MD  ciprofloxacin (CIPRO) 500 MG tablet Take 1 tablet (500 mg total) by mouth 2 (two) times daily. 10/06/15   Brunetta Jeans, PA-C  Coenzyme Q10 (COQ10) 100 MG CAPS Take by mouth.    Historical Provider, MD  hydrochlorothiazide (HYDRODIURIL) 25 MG tablet Take 1 tablet (25 mg total) by mouth daily. 09/21/15   Rosalita Chessman Chase, DO  lisinopril (PRINIVIL,ZESTRIL) 40 MG tablet Take 1 tablet (40 mg total) by mouth daily. 07/13/15   Rosalita Chessman Chase, DO  metoprolol  succinate (TOPROL-XL) 50 MG 24 hr tablet TAKE ONE TABLET BY MOUTH DAILY WITH OR IMMEDIATELY FOLLOWING A MEAL. 07/14/15   Rosalita Chessman Chase, DO  Omega-3 Fatty Acids (FISH OIL) 1200 MG CAPS Take by mouth.    Historical Provider, MD    Allergies:   Allergies  Allergen Reactions  . Codeine     REACTION: insomnia and anxious    Social History:  reports that she has never smoked. She has never used smokeless tobacco. She reports that she does not drink alcohol or use illicit drugs.  Family History: Family History  Problem Relation Age of Onset  . Hypertension Mother   . Kidney disease Mother     dialysis  . Alzheimer's disease Mother   . Hyperlipidemia Father   . Stroke Paternal Grandmother     Physical Exam: Filed Vitals:   10/07/15 1402 10/07/15 1704 10/07/15 1800 10/07/15 1913  BP: 116/79 105/69 97/59 118/66  Pulse: 73 70  65  Temp: 98 F (36.7 C)   98.3 F (36.8 C)  TempSrc: Oral   Oral  Resp: 18 12 14 16   Height: 5\' 1"  (1.549 m)   5\' 1"  (1.549 m)  Weight: 53.978 kg (119 lb)   53 kg (116 lb 13.5 oz)  SpO2: 100% 100%  100%   General appearance: alert, cooperative and no distress Head: Normocephalic, without obvious abnormality, atraumatic Eyes: negative Nose: Nares normal. Septum midline. Mucosa normal. No drainage or sinus tenderness. Neck: no JVD and supple, symmetrical, trachea midline Lungs: clear to auscultation bilaterally Heart: regular rate and rhythm, S1, S2 normal, no murmur, click, rub or gallop Abdomen: soft, non-tender; bowel sounds normal; no masses,  no organomegaly Extremities: extremities normal, atraumatic, no cyanosis or edema Pulses: 2+ and symmetric Skin: Skin color, texture, turgor normal. No rashes or lesions Neurologic: Grossly normal  Labs on Admission:   Recent Labs  10/06/15 1608 10/07/15 1450  NA 115* 116*  K 4.5 4.0  CL 80* 78*  CO2 27 25  GLUCOSE 113* 111*  BUN 6 9  CREATININE 0.79 0.84  CALCIUM 10.0 9.7    Recent Labs   10/06/15 1608 10/07/15 1450  WBC 10.9* 9.8  NEUTROABS 7.7 7.2  HGB 13.7 14.2  HCT 39.4 37.9  MCV 100.3* 95.0  PLT 262.0 251   Radiological Exams on Admission: No results found.  Assessment/Plan  60 yo female with n/v/d x 2 weeks, etoh abuse, hctz use with hyponatremia  Principal Problem:   Hyponatremia-  Check ua.  Repeat bmp now, pt mildly hypovolemic but with combo of hctz and etoh abuse exacerated low level.  Place on ivf ns 100cc /hour.  Hold hctz.  No mental status changes.  Active Problems:   Essential hypertension   H/O ETOH abuse   Nausea, vomiting and diarrhea-  No n/v/d since arrival.   cdiff is pending however.   Anxiety  obs on tele.   Full code.   DAVID,RACHAL A 10/07/2015, 7:46 PM

## 2015-10-07 NOTE — ED Provider Notes (Signed)
CSN: [id number]     Arrival date & time 10/07/15  1356 History   First MD Initiated Contact with Patient 10/07/15 1507     Chief Complaint  Patient presents with  . Abnormal Lab     (Consider location/radiation/quality/duration/timing/severity/associated sxs/prior Treatment) The history is provided by the patient.  Patient w 2 week hx diarrhea, 5-7 times per day, watery. Symptoms moderate, persistent/episodic. Today stools more loose than watery.   Today also with nv, few episodes, not bloody or bilious. Works as Engineer, production, and takes care of patient(s) w colitis.  Unknown exposure. No known specific exposure. No recent abx tx. No travel. No abd pain. Generally weak. Saw pcp provider/service yesterday, and had labs done, was told Na+ level low and to go to ED.  No fevers. Pt indicates Na mildly low for the past 2 years. Was on hctz until yesterday, and was told to stop.  +decreased urination.      Past Medical History  Diagnosis Date  . Hypertension   . Anxiety   . Depression   . Substance abuse   . Cancer ([place]) 04/2014    squamous cell carcinoma L inner thigh  . TIA (transient ischemic attack)    Past Surgical History  Procedure Laterality Date  . Nose surgery      Epistaxis  . Breast enhancement surgery    . Bunionectomy    . Tonsillectomy     Family History  Problem Relation Age of Onset  . Hypertension Mother   . Kidney disease Mother     dialysis  . Alzheimer's disease Mother   . Hyperlipidemia Father   . Stroke Paternal Grandmother    Social History  Substance Use Topics  . Smoking status: Never Smoker   . Smokeless tobacco: Never Used  . Alcohol Use: No     Comment: 3 times a week, states hx of heavy use 15 years ago   OB History    No data available     Review of Systems  Constitutional: Negative for fever, chills and unexpected weight change.  HENT: Negative for sore throat.   Eyes: Negative for redness.  Respiratory: Negative for shortness of breath.    Cardiovascular: Negative for chest pain.  Gastrointestinal: Positive for diarrhea. Negative for abdominal pain.  Endocrine: Negative for polyuria.  Genitourinary: Positive for decreased urine volume. Negative for dysuria and flank pain.  Musculoskeletal: Negative for back pain and neck pain.  Skin: Negative for rash.  Neurological: Negative for headaches.  Hematological: Does not bruise/bleed easily.  Psychiatric/Behavioral: Negative for confusion.      Allergies  Codeine  Home Medications   Prior to Admission medications   Medication Sig Start Date End Date Taking? Authorizing Provider  alendronate (FOSAMAX) 70 MG tablet Take 1 tablet (70 mg total) by mouth once a week. Take with a full glass of water on an empty stomach. 09/21/15   Alferd Apa Lowne Chase, DO  amLODipine (NORVASC) 10 MG tablet Take 1 tablet (10 mg total) by mouth daily. 07/14/15   Mackie Pai, PA-C  aspirin 81 MG tablet Take 81 mg by mouth daily.    Historical Provider, MD  ciprofloxacin (CIPRO) 500 MG tablet Take 1 tablet (500 mg total) by mouth 2 (two) times daily. 10/06/15   Brunetta Jeans, PA-C  Coenzyme Q10 (COQ10) 100 MG CAPS Take by mouth.    Historical Provider, MD  hydrochlorothiazide (HYDRODIURIL) 25 MG tablet Take 1 tablet (25 mg total) by mouth daily. 09/21/15  Alferd Apa Lowne Chase, DO  lisinopril (PRINIVIL,ZESTRIL) 40 MG tablet Take 1 tablet (40 mg total) by mouth daily. 07/13/15   Rosalita Chessman Chase, DO  metoprolol succinate (TOPROL-XL) 50 MG 24 hr tablet TAKE ONE TABLET BY MOUTH DAILY WITH OR IMMEDIATELY FOLLOWING A MEAL. 07/14/15   Rosalita Chessman Chase, DO  Omega-3 Fatty Acids (FISH OIL) 1200 MG CAPS Take by mouth.    Historical Provider, MD   BP 116/79 mmHg  Pulse 73  Temp(Src) 98 F (36.7 C) (Oral)  Resp 18  Ht 5\' 1"  (1.549 m)  Wt 53.978 kg  BMI 22.50 kg/m2  SpO2 100% Physical Exam  Constitutional: She is oriented to person, place, and time. She appears well-developed and well-nourished. No  distress.  HENT:  Mouth/Throat: Oropharynx is clear and moist.  Eyes: Conjunctivae are normal. No scleral icterus.  Neck: Neck supple. No tracheal deviation present.  Cardiovascular: Normal rate, regular rhythm, normal heart sounds and intact distal pulses.  Exam reveals no gallop and no friction rub.   No murmur heard. Pulmonary/Chest: Effort normal. No respiratory distress.  Abdominal: Soft. Normal appearance and bowel sounds are normal. She exhibits no distension and no mass. There is no tenderness. There is no rebound and no guarding.  Genitourinary:  No cva tenderness  Musculoskeletal: She exhibits no edema.  Neurological: She is alert and oriented to person, place, and time.  Skin: Skin is warm and dry. No rash noted. She is not diaphoretic.  Psychiatric: She has a normal mood and affect.  Nursing note and vitals reviewed.   ED Course  Procedures (including critical care time) Labs Review   Results for orders placed or performed during the hospital encounter of [id number]  Basic metabolic panel  Result Value Ref Range   Sodium 116 (LL) 135 - 145 mmol/L   Potassium 4.0 3.5 - 5.1 mmol/L   Chloride 78 (L) 101 - 111 mmol/L   CO2 25 22 - 32 mmol/L   Glucose, Bld 111 (H) 65 - 99 mg/dL   BUN 9 6 - 20 mg/dL   Creatinine, Ser 0.84 0.44 - 1.00 mg/dL   Calcium 9.7 8.9 - 10.3 mg/dL   GFR calc non Af Amer >60 >60 mL/min   GFR calc Af Amer >60 >60 mL/min   Anion gap 13 5 - 15      I have personally reviewed and evaluated these images and lab results as part of my medical decision-making.   EKG Interpretation   Date/Time:  Thursday October 07 2015 15:43:53 EDT Ventricular Rate:  73 PR Interval:  186 QRS Duration: 91 QT Interval:  397 QTC Calculation: 437 R Axis:   78 Text Interpretation:  Sinus rhythm Low voltage, precordial leads Confirmed  by Suad Autrey  MD, Lennette Bihari ([id number]) on 10/07/2015 3:55:52 PM      MDM   Iv ns bolus.  Reviewed nursing notes and prior charts for  additional history. Na reported at 115 yesterday.  Iv ns bolus.  Enteric precautions.  Discussed admission w patient, whether [facility] or [facility] - patient indicates prefers admit to [facility] paged for admission.  Recheck pt, no change in mental status. Alert. Awake. No new c/o. No nv.      Lajean Saver, MD 10/07/15 [phone]

## 2015-10-07 NOTE — ED Notes (Signed)
Pt [id number], attempting to given report, was informed that bed had not been approved yet and that I would have to  Await their approval, request call back

## 2015-10-07 NOTE — Telephone Encounter (Signed)
Received called for Nicole Aquas, PA-C, who saw the patient last night.  He advised that patient be called to see how she is doing.  He also advised Sodium be check STAT (this morning) or if patient continues to experience signs of diarrhea/not feeling any better, to go to the ER.

## 2015-10-07 NOTE — ED Notes (Signed)
Report given to charge RN,

## 2015-10-07 NOTE — Telephone Encounter (Signed)
Called patient from home as not in office today. Has been feeling weak and diarrhea persists. No AMS, palpitations, lightheadedness or dizziness but notes extreme fatigue. She was educated to stop her HCTZ. Her father is taking her directly to [place] ER for acute assessment.

## 2015-10-07 NOTE — ED Notes (Addendum)
Pt reports she was called by PCP and advised to come to ED due to low sodium level-drawn yesterday in the office-diarrhea x 2 weeks-NAD-steady gait

## 2015-10-07 NOTE — Progress Notes (Signed)
This is a no charge note    Transfer from [facility] per Dr. Ashok Cordia  60 year old lady with past medical history of depression, alcohol abuse, TIA, who presents with nausea, vomiting, diarrhea for about 2 weeks. No fever, chills, abdominal pain per Dr. Ashok Cordia. Patient was found to have sodium 115 by her PCP. Her sodium was 134 on 09/21/15. Mental status is normal.   In ED, patient was found to have WBC 9.8, temperature normal, no tachycardia, creatinine normal, Na=115. Pt is accepted to tele bed for observation.  Ivor Costa, MD  [facility] Pager [phone]  If 7PM-7AM, please contact night-coverage [email or web address] Password [facility] 10/07/2015, 4:42 PM

## 2015-10-07 NOTE — Discharge Instructions (Signed)
Transfer to [place].  °

## 2015-10-07 NOTE — Telephone Encounter (Signed)
agree

## 2015-10-07 NOTE — ED Notes (Signed)
Report provided to [facility] 

## 2015-10-07 NOTE — Telephone Encounter (Signed)
[facility] lab reporting critical . Sodium @ 115

## 2015-10-07 NOTE — Telephone Encounter (Signed)
[name] -- please call and assess patient. Was seen last night for diarrhea x 2 weeks. Need STAT repeat BMP for sodium this morning. If any persistent diarrhea or alarm symptoms, she needs to go directly to the ER.

## 2015-10-07 NOTE — ED Notes (Signed)
Attempted report to RN spoke with [name] who stated that her charge nurse would be taking that admission and that she was tied up administering medications, name and number left for return call. Informed that [facility] was in route

## 2015-10-08 ENCOUNTER — Encounter (HOSPITAL_COMMUNITY): Payer: Self-pay | Admitting: *Deleted

## 2015-10-08 DIAGNOSIS — E871 Hypo-osmolality and hyponatremia: Principal | ICD-10-CM

## 2015-10-08 DIAGNOSIS — F10230 Alcohol dependence with withdrawal, uncomplicated: Secondary | ICD-10-CM | POA: Diagnosis not present

## 2015-10-08 DIAGNOSIS — R197 Diarrhea, unspecified: Secondary | ICD-10-CM | POA: Diagnosis not present

## 2015-10-08 DIAGNOSIS — I10 Essential (primary) hypertension: Secondary | ICD-10-CM

## 2015-10-08 DIAGNOSIS — R112 Nausea with vomiting, unspecified: Secondary | ICD-10-CM | POA: Diagnosis not present

## 2015-10-08 LAB — C. DIFFICILE GDH AND TOXIN A/B
C. difficile GDH: NOT DETECTED
C. difficile Toxin A/B: NOT DETECTED

## 2015-10-08 LAB — OVA AND PARASITE EXAMINATION: OP: NONE SEEN

## 2015-10-08 LAB — CBC
HCT: 33.2 % — ABNORMAL LOW (ref 36.0–46.0)
HEMOGLOBIN: 12 g/dL (ref 12.0–15.0)
MCH: 35.2 pg — AB (ref 26.0–34.0)
MCHC: 36.1 g/dL — AB (ref 30.0–36.0)
MCV: 97.4 fL (ref 78.0–100.0)
Platelets: 234 10*3/uL (ref 150–400)
RBC: 3.41 MIL/uL — ABNORMAL LOW (ref 3.87–5.11)
RDW: 11.7 % (ref 11.5–15.5)
WBC: 6.6 10*3/uL (ref 4.0–10.5)

## 2015-10-08 LAB — CREATININE, URINE, RANDOM: Creatinine, Urine: 10.18 mg/dL

## 2015-10-08 LAB — SODIUM, URINE, RANDOM: SODIUM UR: 31 mmol/L

## 2015-10-08 LAB — LACTIC ACID, PLASMA: Lactic Acid, Venous: 1.4 mmol/L (ref 0.5–2.0)

## 2015-10-08 MED ORDER — ZOLPIDEM TARTRATE 5 MG PO TABS
5.0000 mg | ORAL_TABLET | Freq: Once | ORAL | Status: AC
  Administered 2015-10-08: 5 mg via ORAL
  Filled 2015-10-08: qty 1

## 2015-10-08 MED ORDER — CHLORDIAZEPOXIDE HCL 25 MG PO CAPS
25.0000 mg | ORAL_CAPSULE | Freq: Three times a day (TID) | ORAL | Status: DC
  Administered 2015-10-09: 25 mg via ORAL
  Filled 2015-10-08: qty 1

## 2015-10-08 MED ORDER — THIAMINE HCL 100 MG/ML IJ SOLN: 100.0000 mg | Freq: Once | INTRAMUSCULAR | Status: DC

## 2015-10-08 MED ORDER — LOPERAMIDE HCL 2 MG PO CAPS: 2.0000 mg | ORAL_CAPSULE | ORAL | Status: DC | PRN

## 2015-10-08 MED ORDER — SODIUM CHLORIDE 0.9 % IV SOLN
INTRAVENOUS | Status: AC
  Administered 2015-10-08: 09:00:00 via INTRAVENOUS

## 2015-10-08 MED ORDER — CHLORDIAZEPOXIDE HCL 25 MG PO CAPS: 25.0000 mg | ORAL_CAPSULE | Freq: Four times a day (QID) | ORAL | Status: DC | PRN

## 2015-10-08 MED ORDER — CHLORDIAZEPOXIDE HCL 25 MG PO CAPS
25.0000 mg | ORAL_CAPSULE | Freq: Four times a day (QID) | ORAL | Status: AC
  Administered 2015-10-08 (×4): 25 mg via ORAL
  Filled 2015-10-08 (×4): qty 1

## 2015-10-08 MED ORDER — ONDANSETRON 4 MG PO TBDP: 4.0000 mg | ORAL_TABLET | Freq: Four times a day (QID) | ORAL | Status: DC | PRN

## 2015-10-08 MED ORDER — VITAMIN B-1 100 MG PO TABS: 100.0000 mg | ORAL_TABLET | Freq: Every day | ORAL | Status: DC

## 2015-10-08 MED ORDER — HYDROXYZINE HCL 25 MG PO TABS: 25.0000 mg | ORAL_TABLET | Freq: Four times a day (QID) | ORAL | Status: DC | PRN

## 2015-10-08 MED ORDER — CHLORDIAZEPOXIDE HCL 25 MG PO CAPS: 25.0000 mg | ORAL_CAPSULE | Freq: Every day | ORAL | Status: DC

## 2015-10-08 MED ORDER — ADULT MULTIVITAMIN W/MINERALS CH
1.0000 | ORAL_TABLET | Freq: Every day | ORAL | Status: DC
Start: 2015-10-08 — End: 2015-10-08
  Administered 2015-10-08: 1 via ORAL

## 2015-10-08 MED ORDER — CHLORDIAZEPOXIDE HCL 25 MG PO CAPS: 25.0000 mg | ORAL_CAPSULE | ORAL | Status: DC

## 2015-10-08 NOTE — Progress Notes (Addendum)
[facility] PROGRESS NOTE    Progress Note   [name] [id number] DOB: 04/19/1956 DOA: 10/07/2015 PCP: Ann Held, DO   Brief Narrative:   [name] is an 60 y.o. female past medical history: Quantity consumption of years that comes in for diarrhea and hyponatremia.  Assessment/Plan:   Hyponatremia: Likely prerenal in etiology, we'll try to add on a urinary sodium and urinary creatinine to her previous UA. Has improved significantly with IV fluid hydration so will continue IV fluids. Strict I's and O's a week.  Essential hypertension: Stable, continue to monitor.  H/O ETOH abuse She is mild tremors, will monitor with Seawell's score greater than 8 started on Librium protocol.  Nausea, vomiting and diarrhea None today, DC her ciprofloxacin. C. difficile negative.  DVT Prophylaxis - Lovenox ordered.  Family Communication: none Disposition Plan: Home in am Code Status:     Code Status Orders        Start     Ordered   10/07/15 2004  Full code   Continuous     10/07/15 2004    Code Status History    Date Active Date Inactive Code Status Order ID Comments User Context   06/13/2015  4:56 PM 06/15/2015  5:19 PM Full Code [id number]  Melton Alar, PA-C Inpatient        IV Access:    Peripheral IV   Procedures and diagnostic studies:   No results found.   Medical Consultants:    None.  Anti-Infectives:   Anti-infectives    Start     Dose/Rate Route Frequency Ordered Stop   10/07/15 2130  ciprofloxacin (CIPRO) tablet 500 mg     500 mg Oral 2 times daily 10/07/15 2004        Subjective:    [name] she relates she feels better mildly unsteady on her feet.  Objective:    Filed Vitals:   10/07/15 2126 10/07/15 2128 10/07/15 2131 10/08/15 0508  BP: 116/69 102/64 109/65 107/64  Pulse: 74 78 80 69  Temp:    98 F (36.7 C)  TempSrc:    Oral  Resp:    20  Height:      Weight:      SpO2:    100%     Intake/Output Summary (Last 24 hours) at 10/08/15 0829 Last data filed at 10/08/15 0215  Gross per 24 hour  Intake    100 ml  Output   3375 ml  Net  -3275 ml   Filed Weights   10/07/15 1402 10/07/15 1913  Weight: 53.978 kg (119 lb) 53 kg (116 lb 13.5 oz)    Exam: Gen:  NAD Cardiovascular:  RRR. Chest and lungs:   CTAB Abdomen:  Abdomen soft, NT/ND, + BS Extremities:  No edema   Data Reviewed:    Labs: Basic Metabolic Panel:  Recent Labs Lab 10/06/15 1608 10/07/15 1450 10/07/15 2002  NA 115* 116* 122*  K 4.5 4.0 3.5  CL 80* 78* 89*  CO2 27 25 22   GLUCOSE 113* 111* 91  BUN 6 9 7   CREATININE 0.79 0.84 0.58  CALCIUM 10.0 9.7 8.5*   GFR Estimated Creatinine Clearance: 57.8 mL/min (by C-G formula based on Cr of 0.58). Liver Function Tests:  Recent Labs Lab 10/07/15 2002  AST 32  ALT 23  ALKPHOS 70  BILITOT 0.8  PROT 6.7  ALBUMIN 4.0    Recent Labs Lab 10/07/15 2002  LIPASE 29   No  results for input(s): AMMONIA in the last 168 hours. Coagulation profile No results for input(s): INR, PROTIME in the last 168 hours.  CBC:  Recent Labs Lab 10/06/15 1608 10/07/15 1450 10/08/15 0515  WBC 10.9* 9.8 6.6  NEUTROABS 7.7 7.2  --   HGB 13.7 14.2 12.0  HCT 39.4 37.9 33.2*  MCV 100.3* 95.0 97.4  PLT 262.0 251 234   Cardiac Enzymes:  Recent Labs Lab 10/07/15 2002  TROPONINI <0.03   BNP (last 3 results) No results for input(s): PROBNP in the last 8760 hours. CBG: No results for input(s): GLUCAP in the last 168 hours. D-Dimer: No results for input(s): DDIMER in the last 72 hours. Hgb A1c: No results for input(s): HGBA1C in the last 72 hours. Lipid Profile: No results for input(s): CHOL, HDL, LDLCALC, TRIG, CHOLHDL, LDLDIRECT in the last 72 hours. Thyroid function studies:  Recent Labs  10/07/15 2031  TSH 1.386   Anemia work up: No results for input(s): VITAMINB12, FOLATE, FERRITIN, TIBC, IRON, RETICCTPCT in the last 72  hours. Sepsis Labs:  Recent Labs Lab 10/06/15 1608 10/07/15 1450 10/07/15 2034 10/07/15 2325 10/08/15 0515  WBC 10.9* 9.8  --   --  6.6  LATICACIDVEN  --   --  1.2 1.4  --    Microbiology Recent Results (from the past 240 hour(s))  C difficile quick scan w PCR reflex     Status: None   Collection Time: 10/07/15  3:30 PM  Result Value Ref Range Status   C Diff antigen NEGATIVE NEGATIVE Final   C Diff toxin NEGATIVE NEGATIVE Final   C Diff interpretation Negative for toxigenic C. difficile  Final     Medications:   . amLODipine  10 mg Oral Daily  . aspirin EC  81 mg Oral Daily  . ciprofloxacin  500 mg Oral BID  . multivitamin with minerals  1 tablet Oral Daily  . thiamine IV  100 mg Intravenous Daily   Or  . thiamine  100 mg Oral Daily   Continuous Infusions:   Time spent: 25 min     Charlynne Cousins  [facility] Pager [phone]  *Please refer to [email or web address], password [email or web address] to get updated schedule on who will round on this patient, as hospitalists switch teams weekly. If 7PM-7AM, please contact night-coverage at [email or web address], password [email or web address] for any overnight needs.  10/08/2015, 8:29 AM

## 2015-10-08 NOTE — Care Management Note (Signed)
Case Management Note  Patient Details  Name: [name] MRN: [id number] Date of Birth: 03-27-1956  Subjective/Objective: 60 y/o f admitted w/hyponatremia. From home.                   Action/Plan:d/c plan home.   Expected Discharge Date:                  Expected Discharge Plan:  Home/Self Care  In-House Referral:     Discharge planning Services  CM Consult  Post Acute Care Choice:    Choice offered to:     DME Arranged:    DME Agency:     HH Arranged:    HH Agency:     Status of Service:  In process, will continue to follow  Medicare Important Message Given:    Date Medicare IM Given:    Medicare IM give by:    Date Additional Medicare IM Given:    Additional Medicare Important Message give by:     If discussed at [place] of Stay Meetings, dates discussed:    Additional Comments:  Dessa Phi, RN 10/08/2015, 2:13 PM

## 2015-10-08 NOTE — Progress Notes (Signed)
Resumed care of patient. Agree with previous assessment. Orders reviewed and will continue to monitor.

## 2015-10-09 DIAGNOSIS — F10239 Alcohol dependence with withdrawal, unspecified: Secondary | ICD-10-CM

## 2015-10-09 DIAGNOSIS — I10 Essential (primary) hypertension: Secondary | ICD-10-CM | POA: Diagnosis not present

## 2015-10-09 DIAGNOSIS — F10230 Alcohol dependence with withdrawal, uncomplicated: Secondary | ICD-10-CM

## 2015-10-09 DIAGNOSIS — R112 Nausea with vomiting, unspecified: Secondary | ICD-10-CM | POA: Diagnosis not present

## 2015-10-09 DIAGNOSIS — E871 Hypo-osmolality and hyponatremia: Secondary | ICD-10-CM | POA: Diagnosis not present

## 2015-10-09 DIAGNOSIS — F10939 Alcohol use, unspecified with withdrawal, unspecified: Secondary | ICD-10-CM

## 2015-10-09 LAB — BASIC METABOLIC PANEL
ANION GAP: 9 (ref 5–15)
BUN: 7 mg/dL (ref 6–20)
CHLORIDE: 94 mmol/L — AB (ref 101–111)
CO2: 24 mmol/L (ref 22–32)
Calcium: 9.1 mg/dL (ref 8.9–10.3)
Creatinine, Ser: 0.72 mg/dL (ref 0.44–1.00)
GFR calc Af Amer: >60 mL/min (ref >60)
GLUCOSE: 114 mg/dL — AB (ref 65–99)
POTASSIUM: 3.7 mmol/L (ref 3.5–5.1)
Sodium: 127 mmol/L — ABNORMAL LOW (ref 135–145)

## 2015-10-09 NOTE — Progress Notes (Signed)
Patient becoming quite agitated and wanting to go home. Informed of recent lab results and waiting for MD to eval. Patient  saying she will be willing to go AMA if no discharge order.  MD notified and orders obtained to Discharge home with no prescriptions. Eulas Post

## 2015-10-09 NOTE — Discharge Summary (Signed)
Physician Discharge Summary  [name] [id number] DOB: 03/06/1956 DOA: 10/07/2015  PCP: Ann Held, DO  Admit date: 10/07/2015 Discharge date: 10/09/2015  Time spent: 35 minutes  Recommendations for Outpatient Follow-up:  1. Follow up with PCP as needed.   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 Pt has been having diarrhea for 2 weeks nonbloody with vomiting. Still drinking several beers a day. Denies any abd pain or fever. Denies any dizziness or weakness upon standing.  Hospital Course:  Hyponatremia: This was prerenal in etiology, her fraction discussion sodium was less than 1. She was started on IV fluids and her sodium improved. She was also consuming large amounts of alcohol especially the area which could've contributed to her hyponatremia.  Essential hypertension: No changes made to her medication Alcohol withdrawals: She was started on Librium protocol and her symptoms improved.  Nausea vomiting and diarrhea: She had none in the hospital her C. difficile was negative. Her Cipro was DC'd.   Procedures:  CXR  Consultations:  none  Discharge Exam: Filed Vitals:   10/09/15 1030 10/09/15 1335  BP: 123/75 125/77  Pulse:  95  Temp:  97.2 F (36.2 C)  Resp:  18    General: A&O x3 Cardiovascular: RRR Respiratory: good air movement CTA B/L  Discharge Instructions   Discharge Instructions    Diet - low sodium heart healthy    Complete by:  As  directed      Increase activity slowly    Complete by:  As directed           Discharge Medication List as of 10/09/2015  1:55 PM    CONTINUE these medications which have NOT CHANGED   Details  alendronate (FOSAMAX) 70 MG tablet Take 1 tablet (70 mg total) by mouth once a week. Take with a full glass of water on an empty stomach., Starting 09/21/2015, Until Discontinued, Normal    amLODipine (NORVASC) 10 MG tablet Take 1 tablet (10 mg total) by mouth daily., Starting 07/14/2015, Until Discontinued, Normal    aspirin 81 MG tablet Take 81 mg by mouth daily., Until Discontinued, Historical Med    Coenzyme Q10 (COQ10) 100 MG CAPS Take 1 tablet by mouth daily. , Until Discontinued, Historical Med    lisinopril (PRINIVIL,ZESTRIL) 40 MG tablet Take 1 tablet (40 mg total) by mouth daily., Starting 07/13/2015, Until Discontinued, Normal    metoprolol succinate (TOPROL-XL) 50 MG 24 hr tablet TAKE ONE TABLET BY MOUTH DAILY WITH OR IMMEDIATELY FOLLOWING A MEAL., Normal    Omega-3 Fatty Acids (FISH OIL) 1200 MG CAPS Take 1,200 mg by mouth daily. , Until Discontinued, Historical Med      STOP taking these medications     ciprofloxacin (CIPRO) 500 MG tablet        Allergies  Allergen Reactions  . Codeine     REACTION: insomnia and anxious      The results of significant diagnostics from  this hospitalization (including imaging, microbiology, ancillary and laboratory) are listed below for reference.    Significant Diagnostic Studies: No results found.  Microbiology: Recent Results (from the past 240 hour(s))  Ova and parasite examination     Status: None   Collection Time: 10/07/15  8:09 AM  Result Value Ref Range Status   OP No Ova or Parasites Seen   Final  Stool culture     Status: None (Preliminary result)   Collection Time: 10/07/15  8:09 AM  Result Value Ref Range Status   Preliminary Report Culture reincubated for better growth  Preliminary  C difficile quick scan w PCR reflex      Status: None   Collection Time: 10/07/15  3:30 PM  Result Value Ref Range Status   C Diff antigen NEGATIVE NEGATIVE Final   C Diff toxin NEGATIVE NEGATIVE Final   C Diff interpretation Negative for toxigenic C. difficile  Final     Labs: Basic Metabolic Panel:  Recent Labs Lab 10/06/15 1608 10/07/15 1450 10/07/15 2002 10/09/15 0938  NA 115* 116* 122* 127*  K 4.5 4.0 3.5 3.7  CL 80* 78* 89* 94*  CO2 27 25 22 24   GLUCOSE 113* 111* 91 114*  BUN 6 9 7 7   CREATININE 0.79 0.84 0.58 0.72  CALCIUM 10.0 9.7 8.5* 9.1   Liver Function Tests:  Recent Labs Lab 10/07/15 2002  AST 32  ALT 23  ALKPHOS 70  BILITOT 0.8  PROT 6.7  ALBUMIN 4.0    Recent Labs Lab 10/07/15 2002  LIPASE 29   No results for input(s): AMMONIA in the last 168 hours. CBC:  Recent Labs Lab 10/06/15 1608 10/07/15 1450 10/08/15 0515  WBC 10.9* 9.8 6.6  NEUTROABS 7.7 7.2  --   HGB 13.7 14.2 12.0  HCT 39.4 37.9 33.2*  MCV 100.3* 95.0 97.4  PLT 262.0 251 234   Cardiac Enzymes:  Recent Labs Lab 10/07/15 2002  TROPONINI <0.03   BNP: BNP (last 3 results) No results for input(s): BNP in the last 8760 hours.  ProBNP (last 3 results) No results for input(s): PROBNP in the last 8760 hours.  CBG: No results for input(s): GLUCAP in the last 168 hours.     Signed:  Charlynne Cousins MD.  [facility] 10/09/2015, 4:34 PM

## 2015-10-10 NOTE — Assessment & Plan Note (Signed)
>   1 week symptoms. Presumed infectious. Will begin Cipro empirically. Start Probiotic and BRAT diet. Will obtain CBC, CMP and stool studies.

## 2015-10-11 LAB — STOOL CULTURE

## 2015-10-12 ENCOUNTER — Telehealth: Payer: Self-pay | Admitting: Behavioral Health

## 2015-10-12 NOTE — Telephone Encounter (Signed)
Attempted to reach patient for TCM/Hospital Follow-up. Left message for patient to return call when available.    

## 2015-10-16 ENCOUNTER — Other Ambulatory Visit: Payer: Self-pay | Admitting: Family Medicine

## 2015-10-19 ENCOUNTER — Encounter: Payer: Self-pay | Admitting: Physician Assistant

## 2015-10-27 ENCOUNTER — Encounter: Payer: Self-pay | Admitting: Family Medicine

## 2015-10-27 ENCOUNTER — Encounter: Payer: Self-pay | Admitting: Physician Assistant

## 2015-11-03 ENCOUNTER — Other Ambulatory Visit: Payer: Self-pay | Admitting: Medical

## 2015-11-03 ENCOUNTER — Other Ambulatory Visit: Payer: Self-pay | Admitting: Family Medicine

## 2015-11-04 ENCOUNTER — Encounter: Payer: Self-pay | Admitting: Family Medicine

## 2015-11-05 ENCOUNTER — Encounter: Payer: Self-pay | Admitting: Family Medicine

## 2015-11-07 ENCOUNTER — Encounter: Payer: Self-pay | Admitting: Family Medicine

## 2015-11-08 ENCOUNTER — Other Ambulatory Visit: Payer: Self-pay | Admitting: Family Medicine

## 2015-11-08 DIAGNOSIS — F1010 Alcohol abuse, uncomplicated: Secondary | ICD-10-CM

## 2015-11-08 MED ORDER — LORAZEPAM 0.5 MG PO TABS: 0.5000 mg | ORAL_TABLET | Freq: Three times a day (TID) | ORAL | Status: DC | PRN

## 2015-11-09 ENCOUNTER — Ambulatory Visit: Payer: BLUE CROSS/BLUE SHIELD | Admitting: Family Medicine

## 2015-11-12 ENCOUNTER — Ambulatory Visit (INDEPENDENT_AMBULATORY_CARE_PROVIDER_SITE_OTHER): Payer: BLUE CROSS/BLUE SHIELD | Admitting: Family Medicine

## 2015-11-12 ENCOUNTER — Encounter: Payer: Self-pay | Admitting: Family Medicine

## 2015-11-12 VITALS — BP 122/80 | HR 73 | Temp 98.0°F | Wt 116.8 lb

## 2015-11-12 DIAGNOSIS — F10230 Alcohol dependence with withdrawal, uncomplicated: Secondary | ICD-10-CM

## 2015-11-12 DIAGNOSIS — E871 Hypo-osmolality and hyponatremia: Secondary | ICD-10-CM | POA: Diagnosis not present

## 2015-11-12 DIAGNOSIS — F10930 Alcohol use, unspecified with withdrawal, uncomplicated: Secondary | ICD-10-CM

## 2015-11-12 LAB — BASIC METABOLIC PANEL
BUN: 7 mg/dL (ref 7–25)
CHLORIDE: 94 mmol/L — AB (ref 98–110)
CO2: 19 mmol/L — AB (ref 20–31)
CREATININE: 0.8 mg/dL (ref 0.50–1.05)
Calcium: 10.5 mg/dL — ABNORMAL HIGH (ref 8.6–10.4)
Glucose, Bld: 91 mg/dL (ref 65–99)
POTASSIUM: 4.6 mmol/L (ref 3.5–5.3)
Sodium: 132 mmol/L — ABNORMAL LOW (ref 135–146)

## 2015-11-12 NOTE — Progress Notes (Signed)
Patient ID: ANNMARGARET CARUSONE, female    DOB: February 27, 1956  Age: 60 y.o. MRN: [id number]    Subjective:  Subjective HPI [name] presents for f/u low sodium and etoh addiction.  Pt has 6 pack beer a day.  This is still not as bad as it was 10-15 years ago but pt is concerned and willing to go to out pt therapy.   Pt was given meds to prevent withdrawal symptoms.  She has not gotten the ativan yet.    Review of Systems  Constitutional: Negative for diaphoresis, appetite change, fatigue and unexpected weight change.  Eyes: Negative for pain, redness and visual disturbance.  Respiratory: Negative for cough, chest tightness, shortness of breath and wheezing.   Cardiovascular: Negative for chest pain, palpitations and leg swelling.  Endocrine: Negative for cold intolerance, heat intolerance, polydipsia, polyphagia and polyuria.  Genitourinary: Negative for dysuria, frequency and difficulty urinating.  Neurological: Negative for dizziness, light-headedness, numbness and headaches.    History Past Medical History  Diagnosis Date  . Hypertension   . Anxiety   . Depression   . Substance abuse   . Cancer ([place]) 04/2014    squamous cell carcinoma L inner thigh  . TIA (transient ischemic attack)     She has past surgical history that includes Nose surgery; Breast enhancement surgery; Bunionectomy; and Tonsillectomy.   Her family history includes Alzheimer's disease in her mother; Hyperlipidemia in her father; Hypertension in her mother; Kidney disease in her mother; Stroke in her paternal grandmother.She reports that she has never smoked. She has never used smokeless tobacco. She reports that she does not drink alcohol or use illicit drugs.  Current Outpatient Prescriptions on File Prior to Visit  Medication Sig Dispense Refill  . alendronate (FOSAMAX) 70 MG tablet Take 1 tablet (70 mg total) by mouth once a week. Take with a full glass of water on an empty stomach. 12 tablet 3  .  amLODipine (NORVASC) 10 MG tablet TAKE 1 TABLET (10 MG TOTAL) BY MOUTH DAILY. 90 tablet 0  . aspirin 81 MG tablet Take 81 mg by mouth daily.    . Coenzyme Q10 (COQ10) 100 MG CAPS Take 1 tablet by mouth daily.     Marland Kitchen lisinopril (PRINIVIL,ZESTRIL) 40 MG tablet TAKE 1 TABLET (40 MG TOTAL) BY MOUTH DAILY. 90 tablet 1  . LORazepam (ATIVAN) 0.5 MG tablet Take 1 tablet (0.5 mg total) by mouth every 8 (eight) hours as needed for anxiety (alcohol withdrawal). 45 tablet 1  . metoprolol succinate (TOPROL-XL) 50 MG 24 hr tablet TAKE ONE TABLET BY MOUTH DAILY WITH OR IMMEDIATELY FOLLOWING A MEAL. 90 tablet 1  . Omega-3 Fatty Acids (FISH OIL) 1200 MG CAPS Take 1,200 mg by mouth daily.      No current facility-administered medications on file prior to visit.     Objective:  Objective Physical Exam  Constitutional: She is oriented to person, place, and time. She appears well-developed and well-nourished.  HENT:  Head: Normocephalic and atraumatic.  Eyes: Conjunctivae and EOM are normal.  Neck: Normal range of motion. Neck supple. No JVD present. Carotid bruit is not present. No thyromegaly present.  Cardiovascular: Normal rate, regular rhythm and normal heart sounds.   No murmur heard. Pulmonary/Chest: Effort normal and breath sounds normal. No respiratory distress. She has no wheezes. She has no rales. She exhibits no tenderness.  Musculoskeletal: She exhibits no edema.  Neurological: She is alert and oriented to person, place, and time.  Psychiatric: She has  a normal mood and affect.  Nursing note and vitals reviewed.  BP 122/80 mmHg  Pulse 73  Temp(Src) 98 F (36.7 C) (Oral)  Wt 116 lb 12.8 oz (52.98 kg)  SpO2 98% Wt Readings from Last 3 Encounters:  11/12/15 116 lb 12.8 oz (52.98 kg)  10/09/15 115 lb 4.8 oz (52.3 kg)  10/06/15 119 lb 12.8 oz (54.341 kg)     Lab Results  Component Value Date   WBC 6.6 10/08/2015   HGB 12.0 10/08/2015   HCT 33.2* 10/08/2015   PLT 234 10/08/2015    GLUCOSE 91 11/12/2015   CHOL 193 09/21/2015   TRIG 72.0 09/21/2015   HDL 69.80 09/21/2015   LDLDIRECT 127.6 03/28/2013   LDLCALC 108* 09/21/2015   ALT 23 10/07/2015   AST 32 10/07/2015   NA 132* 11/12/2015   K 4.6 11/12/2015   CL 94* 11/12/2015   CREATININE 0.80 11/12/2015   BUN 7 11/12/2015   CO2 19* 11/12/2015   TSH 1.386 10/07/2015   INR 0.99 06/13/2015   HGBA1C 5.2 06/14/2015   MICROALBUR 1.0 05/29/2014    No results found.   Assessment & Plan:  Plan I am having [name] maintain her aspirin, Fish Oil, CoQ10, alendronate, metoprolol succinate, lisinopril, amLODipine, and LORazepam.  No orders of the defined types were placed in this encounter.    Problem List Items Addressed This Visit      Unprioritized   Alcohol withdrawal ([place])    rx given for ativan Pt waiting for call back from [facility] for substance abuse counseling Pt to call 911 if she has any problems with withdrawal over weekend      Relevant Orders   Basic Metabolic Panel (BMET) (Completed)    Other Visit Diagnoses    Low sodium levels    -  Primary    Relevant Orders    Basic Metabolic Panel (BMET) (Completed)       Follow-up: Return if symptoms worsen or fail to improve.  Nicole Held, DO

## 2015-11-12 NOTE — Assessment & Plan Note (Signed)
rx given for ativan Pt waiting for call back from [facility] for substance abuse counseling Pt to call 911 if she has any problems with withdrawal over weekend

## 2015-11-12 NOTE — Patient Instructions (Signed)
Alcohol Withdrawal  Alcohol withdrawal is a group of symptoms that can develop when a person who drinks heavily and regularly stops drinking or drinks less.  CAUSES  Heavy and regular drinking can cause chemicals that send signals from the brain to the body (neurotransmitters) to deactivate. Alcohol withdrawal develops when deactivated neurotransmitters reactivate because a person stops drinking or drinks less.  RISK FACTORS  The more a person drinks and the longer he or she drinks, the greater the risk of alcohol withdrawal. Severe withdrawal is more likely to develop in someone who:  · Had severe alcohol withdrawal in the past.  · Had a seizure during a previous episode of alcohol withdrawal.  · Is elderly.  · Is pregnant.  · Has been abusing drugs.  · Has other medical problems, including:    Infection.    Heart, lung, or liver disease.    Seizures.    Mental health problems.  SYMPTOMS  Symptoms of this condition can be mild to moderate, or they can be severe.  Mild to moderate symptoms may include:  · Fatigue.  · Nightmares.  · Trouble sleeping.  · Depression.  · Anxiety.  · Inability to think clearly.  · Mood swings.  · Irritability.  · Loss of appetite.  · Nausea or vomiting.  · Clammy skin.  · Extreme sweating.  · Rapid heartbeat.  · Shakiness.  · Uncontrollable shaking (tremor).  Severe symptoms may include:  · Fever.  · Seizures.  · Severe confusion.  · Feeling or seeing things that are not there (hallucinations).  Symptoms usually begin within eight hours after a person stops drinking or drinks less. They can last for weeks.  DIAGNOSIS  Alcohol withdrawal is diagnosed with a medical history and physical exam. Sometimes, urine and blood tests are also done.  TREATMENT  Treatment may involve:  · Monitoring blood pressure, pulse, and breathing.  · Getting fluids through an IV tube.  · Medicine to reduce anxiety.  · Medicine to prevent or control seizures.  · Multivitamins and B vitamins.  · Having a health  care provider check on you daily.  If symptoms are moderate to severe or if there is a risk of severe withdrawal, treatment may be done at a hospital or treatment center.  HOME CARE INSTRUCTIONS  · Take medicines and vitamin supplements only as directed by your health care provider.  · Do not drink alcohol.  · Have someone stay with you or be available if you need help.  · Drink enough fluid to keep your urine clear or pale yellow.  · Consider joining a 12-step program or another alcohol support group.  SEEK MEDICAL CARE IF:  · Your symptoms get worse or do not go away.  · You cannot keep food or water in your stomach.  · You are struggling with not drinking alcohol.  · You cannot stop drinking alcohol.  SEEK IMMEDIATE MEDICAL CARE IF:   · You have an irregular heartbeat.  · You have chest pain.  · You have trouble breathing.  · You have symptoms of severe withdrawal, such as:    A fever.    Seizures.    Severe confusion.    Hallucinations.     This information is not intended to replace advice given to you by your health care provider. Make sure you discuss any questions you have with your health care provider.     Document Released: 04/05/2005 Document Revised: 07/17/2014 Document Reviewed: 04/14/2014  Elsevier Interactive 

## 2015-11-12 NOTE — Progress Notes (Signed)
Pre visit review using our clinic review tool, if applicable. No additional management support is needed unless otherwise documented below in the visit note. 

## 2015-11-23 ENCOUNTER — Ambulatory Visit
Admission: RE | Admit: 2015-11-23 | Discharge: 2015-11-23 | Disposition: A | Discharge status: Home / Self Care | Payer: BLUE CROSS/BLUE SHIELD | Source: Ambulatory Visit

## 2015-11-23 DIAGNOSIS — Z1231 Encounter for screening mammogram for malignant neoplasm of breast: Secondary | ICD-10-CM

## 2016-01-27 ENCOUNTER — Encounter: Payer: Self-pay | Admitting: Family Medicine

## 2016-01-27 MED ORDER — AMLODIPINE BESYLATE 5 MG PO TABS: 5.0000 mg | ORAL_TABLET | Freq: Every day | ORAL | Status: DC

## 2016-01-27 NOTE — Telephone Encounter (Signed)
Pt should cut norvasc 5 mg #30  1 po qd ,  2 refills  Ov in 2-3 weeks

## 2016-01-27 NOTE — Telephone Encounter (Signed)
LMOM informing Pt to d/c Amlodipine 10 mg 1 tab daily and start Amlodipine 5 mg 1 tab daily and f/u w/ PCP in 2-3 weeks. Rx sent to [facility].

## 2016-02-02 ENCOUNTER — Encounter (HOSPITAL_COMMUNITY): Payer: Self-pay

## 2016-04-10 ENCOUNTER — Other Ambulatory Visit: Payer: Self-pay | Admitting: Family Medicine

## 2016-04-11 DIAGNOSIS — Z23 Encounter for immunization: Secondary | ICD-10-CM | POA: Diagnosis not present

## 2016-04-29 ENCOUNTER — Other Ambulatory Visit: Payer: Self-pay | Admitting: Family Medicine

## 2016-07-13 ENCOUNTER — Other Ambulatory Visit: Payer: Self-pay | Admitting: Family Medicine

## 2016-07-27 ENCOUNTER — Other Ambulatory Visit: Payer: Self-pay | Admitting: Family Medicine

## 2016-08-22 ENCOUNTER — Other Ambulatory Visit: Payer: Self-pay | Admitting: Family Medicine

## 2016-08-22 DIAGNOSIS — M8580 Other specified disorders of bone density and structure, unspecified site: Secondary | ICD-10-CM

## 2016-09-26 ENCOUNTER — Encounter: Payer: BLUE CROSS/BLUE SHIELD | Admitting: Family Medicine

## 2016-10-08 ENCOUNTER — Other Ambulatory Visit: Payer: Self-pay | Admitting: Family Medicine

## 2016-10-12 ENCOUNTER — Encounter: Payer: BLUE CROSS/BLUE SHIELD | Admitting: Family Medicine

## 2016-10-29 ENCOUNTER — Other Ambulatory Visit: Payer: Self-pay | Admitting: Family Medicine

## 2016-11-09 ENCOUNTER — Other Ambulatory Visit: Payer: Self-pay | Admitting: Family Medicine

## 2016-11-09 DIAGNOSIS — Z1231 Encounter for screening mammogram for malignant neoplasm of breast: Secondary | ICD-10-CM

## 2016-11-12 ENCOUNTER — Other Ambulatory Visit: Payer: Self-pay | Admitting: Family Medicine

## 2016-11-12 DIAGNOSIS — M8580 Other specified disorders of bone density and structure, unspecified site: Secondary | ICD-10-CM

## 2016-11-13 ENCOUNTER — Encounter: Payer: BLUE CROSS/BLUE SHIELD | Admitting: Family Medicine

## 2016-11-28 ENCOUNTER — Encounter: Payer: BLUE CROSS/BLUE SHIELD | Admitting: Family Medicine

## 2016-12-01 ENCOUNTER — Ambulatory Visit
Admission: RE | Admit: 2016-12-01 | Discharge: 2016-12-01 | Disposition: A | Discharge status: Home / Self Care | Payer: BLUE CROSS/BLUE SHIELD | Source: Ambulatory Visit | Attending: Family Medicine | Admitting: Family Medicine

## 2016-12-01 DIAGNOSIS — Z1231 Encounter for screening mammogram for malignant neoplasm of breast: Secondary | ICD-10-CM | POA: Diagnosis not present

## 2016-12-26 ENCOUNTER — Encounter: Payer: BLUE CROSS/BLUE SHIELD | Admitting: Family Medicine

## 2017-02-09 ENCOUNTER — Other Ambulatory Visit: Payer: Self-pay | Admitting: Family Medicine

## 2017-02-09 DIAGNOSIS — M8580 Other specified disorders of bone density and structure, unspecified site: Secondary | ICD-10-CM

## 2017-03-16 ENCOUNTER — Encounter: Payer: BLUE CROSS/BLUE SHIELD | Admitting: Family Medicine

## 2017-04-05 ENCOUNTER — Other Ambulatory Visit: Payer: Self-pay | Admitting: Family Medicine

## 2017-04-22 ENCOUNTER — Other Ambulatory Visit: Payer: Self-pay | Admitting: Family Medicine

## 2017-04-24 ENCOUNTER — Encounter: Payer: Self-pay | Admitting: Family Medicine

## 2017-04-30 ENCOUNTER — Other Ambulatory Visit: Payer: Self-pay | Admitting: Family Medicine

## 2017-04-30 ENCOUNTER — Other Ambulatory Visit (HOSPITAL_COMMUNITY)
Admission: RE | Admit: 2017-04-30 | Discharge: 2017-04-30 | Disposition: A | Discharge status: Home / Self Care | Payer: BLUE CROSS/BLUE SHIELD | Source: Ambulatory Visit | Attending: Family Medicine | Admitting: Family Medicine

## 2017-04-30 ENCOUNTER — Ambulatory Visit (INDEPENDENT_AMBULATORY_CARE_PROVIDER_SITE_OTHER): Payer: BLUE CROSS/BLUE SHIELD | Admitting: Family Medicine

## 2017-04-30 ENCOUNTER — Encounter: Payer: Self-pay | Admitting: Family Medicine

## 2017-04-30 VITALS — BP 138/80 | HR 88 | Temp 97.7°F | Ht 61.0 in | Wt 116.0 lb

## 2017-04-30 DIAGNOSIS — E2839 Other primary ovarian failure: Secondary | ICD-10-CM

## 2017-04-30 DIAGNOSIS — I10 Essential (primary) hypertension: Secondary | ICD-10-CM | POA: Diagnosis not present

## 2017-04-30 DIAGNOSIS — Z124 Encounter for screening for malignant neoplasm of cervix: Secondary | ICD-10-CM

## 2017-04-30 DIAGNOSIS — Z23 Encounter for immunization: Secondary | ICD-10-CM | POA: Diagnosis not present

## 2017-04-30 DIAGNOSIS — M8580 Other specified disorders of bone density and structure, unspecified site: Secondary | ICD-10-CM | POA: Diagnosis not present

## 2017-04-30 DIAGNOSIS — Z0000 Encounter for general adult medical examination without abnormal findings: Secondary | ICD-10-CM | POA: Insufficient documentation

## 2017-04-30 LAB — CBC WITH DIFFERENTIAL/PLATELET
Basophils Absolute: 0.1 10*3/uL (ref 0.0–0.1)
Basophils Relative: 1.2 % (ref 0.0–3.0)
EOS ABS: 0.2 10*3/uL (ref 0.0–0.7)
Eosinophils Relative: 1.9 % (ref 0.0–5.0)
HCT: 39 % (ref 36.0–46.0)
Hemoglobin: 13.4 g/dL (ref 12.0–15.0)
LYMPHS ABS: 1.5 10*3/uL (ref 0.7–4.0)
Lymphocytes Relative: 19.6 % (ref 12.0–46.0)
MCHC: 34.4 g/dL (ref 30.0–36.0)
MCV: 106.4 fl — ABNORMAL HIGH (ref 78.0–100.0)
MONO ABS: 0.8 10*3/uL (ref 0.1–1.0)
Monocytes Relative: 10.5 % (ref 3.0–12.0)
NEUTROS PCT: 66.8 % (ref 43.0–77.0)
Neutro Abs: 5.2 10*3/uL (ref 1.4–7.7)
Platelets: 381 10*3/uL (ref 150.0–400.0)
RBC: 3.67 Mil/uL — AB (ref 3.87–5.11)
RDW: 12.6 % (ref 11.5–15.5)
WBC: 7.8 10*3/uL (ref 4.0–10.5)

## 2017-04-30 LAB — COMPREHENSIVE METABOLIC PANEL
ALK PHOS: 73 U/L (ref 39–117)
ALT: 27 U/L (ref 0–35)
AST: 36 U/L (ref 0–37)
Albumin: 4.8 g/dL (ref 3.5–5.2)
BUN: 8 mg/dL (ref 6–23)
CHLORIDE: 91 meq/L — AB (ref 96–112)
CO2: 26 mEq/L (ref 19–32)
CREATININE: 0.73 mg/dL (ref 0.40–1.20)
Calcium: 10.3 mg/dL (ref 8.4–10.5)
GFR: 86.34 mL/min (ref >60.00)
GLUCOSE: 99 mg/dL (ref 70–99)
POTASSIUM: 4.4 meq/L (ref 3.5–5.1)
SODIUM: 129 meq/L — AB (ref 135–145)
TOTAL PROTEIN: 8.2 g/dL (ref 6.0–8.3)
Total Bilirubin: 0.7 mg/dL (ref 0.2–1.2)

## 2017-04-30 LAB — POC URINALSYSI DIPSTICK (AUTOMATED)
BILIRUBIN UA: NEGATIVE
Blood, UA: NEGATIVE
Glucose, UA: NEGATIVE
KETONES UA: NEGATIVE
LEUKOCYTES UA: NEGATIVE
Nitrite, UA: NEGATIVE
Protein, UA: NEGATIVE
Spec Grav, UA: 1.01 (ref 1.010–1.025)
Urobilinogen, UA: 0.2 E.U./dL
pH, UA: 6 (ref 5.0–8.0)

## 2017-04-30 LAB — LIPID PANEL
CHOL/HDL RATIO: 3
Cholesterol: 215 mg/dL — ABNORMAL HIGH (ref 0–200)
HDL: 64.4 mg/dL (ref >39.00)
LDL CALC: 128 mg/dL — AB (ref 0–99)
NONHDL: 150.58
Triglycerides: 114 mg/dL (ref 0.0–149.0)
VLDL: 22.8 mg/dL (ref 0.0–40.0)

## 2017-04-30 LAB — TSH: TSH: 1.09 u[IU]/mL (ref 0.35–4.50)

## 2017-04-30 MED ORDER — AMLODIPINE BESYLATE 5 MG PO TABS: 5.0000 mg | ORAL_TABLET | Freq: Every day | ORAL | 1 refills | Status: DC

## 2017-04-30 MED ORDER — ALENDRONATE SODIUM 70 MG PO TABS: ORAL_TABLET | ORAL | 3 refills | Status: DC

## 2017-04-30 MED ORDER — LISINOPRIL 40 MG PO TABS: 40.0000 mg | ORAL_TABLET | Freq: Every day | ORAL | 1 refills | Status: DC

## 2017-04-30 MED ORDER — METOPROLOL SUCCINATE ER 50 MG PO TB24: ORAL_TABLET | ORAL | 1 refills | Status: DC

## 2017-04-30 NOTE — Progress Notes (Signed)
Subjective:     [name] is a 61 y.o. female and is here for a comprehensive physical exam. The patient reports no problems.  Social History   Social History  . Marital status: Divorced    Spouse name: N/A  . Number of children: N/A  . Years of education: N/A   Occupational History  . homehealth     c[employer]   Social History Main Topics  . Smoking status: Never Smoker  . Smokeless tobacco: Never Used  . Alcohol use No     Comment: 3 times a week, states hx of heavy use 15 years ago  . Drug use: No  . Sexual activity: Not Currently    Partners: Male   Other Topics Concern  . Not on file   Social History Narrative   Exercise--- gym 6x a week   Health Maintenance  Topic Date Due  . DEXA SCAN  09/10/2016  . INFLUENZA VACCINE  02/07/2017  . PAP SMEAR  05/29/2017  . COLONOSCOPY  11/03/2017  . MAMMOGRAM  12/02/2018  . TETANUS/TDAP  05/29/2024  . Hepatitis C Screening  Completed  . HIV Screening  Completed    The following portions of the patient's history were reviewed and updated as appropriate:  She  has a past medical history of Anxiety; Cancer ([place]) (04/2014); Depression; Hypertension; Substance abuse ([place]); and TIA (transient ischemic attack). She  does not have any pertinent problems on file. She  has a past surgical history that includes Nose surgery; Breast enhancement surgery; Bunionectomy; Tonsillectomy; and Augmentation mammaplasty (Bilateral, 1988). Her family history includes Alzheimer's disease in her mother; Hyperlipidemia in her father; Hypertension in her mother; Kidney disease in her mother; Stroke in her paternal grandmother. She  reports that she has never smoked. She has never used smokeless tobacco. She reports that she does not drink alcohol or use drugs. She has a current medication list which includes the following prescription(s): alendronate, amlodipine, amlodipine, coq10, lisinopril, metoprolol succinate, and fish oil. Current Outpatient  Prescriptions on File Prior to Visit  Medication Sig Dispense Refill  . amLODipine (NORVASC) 5 MG tablet TAKE 1 TABLET (5 MG TOTAL) BY MOUTH DAILY. 30 tablet 2  . Coenzyme Q10 (COQ10) 100 MG CAPS Take 1 tablet by mouth daily.     . Omega-3 Fatty Acids (FISH OIL) 1200 MG CAPS Take 1,200 mg by mouth daily.      No current facility-administered medications on file prior to visit.    She is allergic to codeine..  Review of Systems Review of Systems  Constitutional: Negative for activity change, appetite change and fatigue.  HENT: Negative , congestion, tinnitus and ear discharge.  dentist q64m  +hearing loss Eyes: Negative for visual disturbance (see optho - due) Respiratory: Negative for cough, chest tightness and shortness of breath.   Cardiovascular: Negative for chest pain, palpitations and leg swelling.  Gastrointestinal: Negative for abdominal pain, diarrhea, constipation and abdominal distention.  Genitourinary: Negative for urgency, frequency, decreased urine volume and difficulty urinating.  Musculoskeletal: Negative for back pain, arthralgias and gait problem.  Skin: Negative for color change, pallor and rash.  Neurological: Negative for dizziness, light-headedness, numbness and headaches.  Hematological: Negative for adenopathy. Does not bruise/bleed easily.  Psychiatric/Behavioral: Negative for suicidal ideas, confusion, sleep disturbance, self-injury, dysphoric mood, decreased concentration and agitation.       Objective:    BP 138/80   Pulse 88   Temp 97.7 F (36.5 C) (Oral)   Ht 5\' 1"  (1.549 m)  Wt 116 lb (52.6 kg)   SpO2 97%   BMI 21.92 kg/m  General appearance: alert, cooperative, appears stated age and no distress Head: Normocephalic, without obvious abnormality, atraumatic Eyes: conjunctivae/corneas clear. PERRL, EOM's intact. Fundi benign. Ears: normal TM's and external ear canals both ears Nose: Nares normal. Septum midline. Mucosa normal. No drainage or  sinus tenderness. Throat: lips, mucosa, and tongue normal; teeth and gums normal Neck: no adenopathy, no carotid bruit, no JVD, supple, symmetrical, trachea midline and thyroid not enlarged, symmetric, no tenderness/mass/nodules Back: symmetric, no curvature. ROM normal. No CVA tenderness. Lungs: clear to auscultation bilaterally Breasts: normal appearance, no masses or tenderness Heart: regular rate and rhythm, S1, S2 normal, no murmur, click, rub or gallop Abdomen: soft, non-tender; bowel sounds normal; no masses,  no organomegaly Pelvic: cervix normal in appearance, external genitalia normal, no adnexal masses or tenderness, no cervical motion tenderness, rectovaginal septum normal, uterus normal size, shape, and consistency, vagina normal without discharge and pap done,  heme neg brown stool  Extremities: extremities normal, atraumatic, no cyanosis or edema Pulses: 2+ and symmetric Skin: Skin color, texture, turgor normal. No rashes or lesions Lymph nodes: Cervical, supraclavicular, and axillary nodes normal. Neurologic: Alert and oriented X 3, normal strength and tone. Normal symmetric reflexes. Normal coordination and gait    Assessment:    Healthy female exam.      Plan:     ghm utd Check labs See After Visit Summary for Counseling Recommendations    1. Need for immunization against influenza  - Flu Vaccine QUAD 6+ mos IM (Fluarix)  2. Estrogen deficiency  - DG Bone Density; Future - alendronate (FOSAMAX) 70 MG tablet; TAKE 1 TABLET BY MOUTH ONCE WEEKLY WITH A FULL GLASS OF WATER ON EMPTY STOMACH  Dispense: 12 tablet; Refill: 3  3. Osteopenia, unspecified location  - alendronate (FOSAMAX) 70 MG tablet; TAKE 1 TABLET BY MOUTH ONCE WEEKLY WITH A FULL GLASS OF WATER ON EMPTY STOMACH  Dispense: 12 tablet; Refill: 3  4. Essential hypertension Well controlled, no changes to meds. Encouraged heart healthy diet such as the DASH diet and exercise as tolerated.  - amLODipine  (NORVASC) 5 MG tablet; Take 1 tablet (5 mg total) by mouth daily.  Dispense: 90 tablet; Refill: 1 - lisinopril (PRINIVIL,ZESTRIL) 40 MG tablet; Take 1 tablet (40 mg total) by mouth daily.  Dispense: 90 tablet; Refill: 1 - metoprolol succinate (TOPROL-XL) 50 MG 24 hr tablet; TAKE ONE TABLET BY MOUTH DAILY WITH OR IMMEDIATELY FOLLOWING A MEAL.  Dispense: 90 tablet; Refill: 1 - TSH - Lipid panel - CBC with Differential/Platelet - Comprehensive metabolic panel - POCT Urinalysis Dipstick (Automated)  5. Preventative health care See above - TSH - Lipid panel - CBC with Differential/Platelet - Comprehensive metabolic panel - POCT Urinalysis Dipstick (Automated) - Cytology - PAP  6. Screening for cervical cancer  - Cytology - PAP

## 2017-04-30 NOTE — Patient Instructions (Signed)
Preventive Care 40-64 Years, Female Preventive care refers to lifestyle choices and visits with your health care provider that can promote health and wellness. What does preventive care include?  A yearly physical exam. This is also called an annual well check.  Dental exams once or twice a year.  Routine eye exams. Ask your health care provider how often you should have your eyes checked.  Personal lifestyle choices, including: ? Daily care of your teeth and gums. ? Regular physical activity. ? Eating a healthy diet. ? Avoiding tobacco and drug use. ? Limiting alcohol use. ? Practicing safe sex. ? Taking low-dose aspirin daily starting at age 58. ? Taking vitamin and mineral supplements as recommended by your health care provider. What happens during an annual well check? The services and screenings done by your health care provider during your annual well check will depend on your age, overall health, lifestyle risk factors, and family history of disease. Counseling Your health care provider may ask you questions about your:  Alcohol use.  Tobacco use.  Drug use.  Emotional well-being.  Home and relationship well-being.  Sexual activity.  Eating habits.  Work and work Statistician.  Method of birth control.  Menstrual cycle.  Pregnancy history.  Screening You may have the following tests or measurements:  Height, weight, and BMI.  Blood pressure.  Lipid and cholesterol levels. These may be checked every 5 years, or more frequently if you are over 81 years old.  Skin check.  Lung cancer screening. You may have this screening every year starting at age 78 if you have a 30-pack-year history of smoking and currently smoke or have quit within the past 15 years.  Fecal occult blood test (FOBT) of the stool. You may have this test every year starting at age 65.  Flexible sigmoidoscopy or colonoscopy. You may have a sigmoidoscopy every 5 years or a colonoscopy  every 10 years starting at age 30.  Hepatitis C blood test.  Hepatitis B blood test.  Sexually transmitted disease (STD) testing.  Diabetes screening. This is done by checking your blood sugar (glucose) after you have not eaten for a while (fasting). You may have this done every 1-3 years.  Mammogram. This may be done every 1-2 years. Talk to your health care provider about when you should start having regular mammograms. This may depend on whether you have a family history of breast cancer.  BRCA-related cancer screening. This may be done if you have a family history of breast, ovarian, tubal, or peritoneal cancers.  Pelvic exam and Pap test. This may be done every 3 years starting at age 80. Starting at age 36, this may be done every 5 years if you have a Pap test in combination with an HPV test.  Bone density scan. This is done to screen for osteoporosis. You may have this scan if you are at high risk for osteoporosis.  Discuss your test results, treatment options, and if necessary, the need for more tests with your health care provider. Vaccines Your health care provider may recommend certain vaccines, such as:  Influenza vaccine. This is recommended every year.  Tetanus, diphtheria, and acellular pertussis (Tdap, Td) vaccine. You may need a Td booster every 10 years.  Varicella vaccine. You may need this if you have not been vaccinated.  Zoster vaccine. You may need this after age 5.  Measles, mumps, and rubella (MMR) vaccine. You may need at least one dose of MMR if you were born in  1956 or later. You may also need a second dose.  Pneumococcal 13-valent conjugate (PCV13) vaccine. You may need this if you have certain conditions and were not previously vaccinated.  Pneumococcal polysaccharide (PPSV23) vaccine. You may need one or two doses if you smoke cigarettes or if you have certain conditions.  Meningococcal vaccine. You may need this if you have certain  conditions.  Hepatitis A vaccine. You may need this if you have certain conditions or if you travel or work in places where you may be exposed to hepatitis A.  Hepatitis B vaccine. You may need this if you have certain conditions or if you travel or work in places where you may be exposed to hepatitis B.  Haemophilus influenzae type b (Hib) vaccine. You may need this if you have certain conditions.  Talk to your health care provider about which screenings and vaccines you need and how often you need them. This information is not intended to replace advice given to you by your health care provider. Make sure you discuss any questions you have with your health care provider. Document Released: 07/23/2015 Document Revised: 03/15/2016 Document Reviewed: 04/27/2015 Elsevier Interactive Patient Education  2017 [employer].

## 2017-05-02 LAB — CYTOLOGY - PAP
DIAGNOSIS: NEGATIVE
HPV (WINDOPATH): NOT DETECTED

## 2017-05-03 ENCOUNTER — Telehealth: Payer: Self-pay | Admitting: Family Medicine

## 2017-05-03 NOTE — Telephone Encounter (Signed)
Relation to [facility]:EBRA Call back [phone]   Reason for call:  Patient last seen 04/30/17 by Dr. Etter Sjogren for her physical, patient inquiring about her lab results,please advise

## 2017-05-07 NOTE — Telephone Encounter (Signed)
Patient returning call in regards to labs.

## 2017-05-07 NOTE — Telephone Encounter (Signed)
Called to inform Pt of labs results as requested. Pt did not answer. Left VM to have he call back.

## 2017-05-10 ENCOUNTER — Other Ambulatory Visit: Payer: Self-pay | Admitting: Family Medicine

## 2017-05-10 DIAGNOSIS — E785 Hyperlipidemia, unspecified: Secondary | ICD-10-CM

## 2017-05-11 NOTE — Telephone Encounter (Signed)
Called Pt twice, left VM asking Pt to call back. Will mail off copy of lab results

## 2017-05-30 ENCOUNTER — Ambulatory Visit
Admission: RE | Admit: 2017-05-30 | Discharge: 2017-05-30 | Disposition: A | Discharge status: Home / Self Care | Payer: BLUE CROSS/BLUE SHIELD | Source: Ambulatory Visit | Attending: Family Medicine | Admitting: Family Medicine

## 2017-05-30 DIAGNOSIS — Z780 Asymptomatic menopausal state: Secondary | ICD-10-CM | POA: Diagnosis not present

## 2017-05-30 DIAGNOSIS — E2839 Other primary ovarian failure: Secondary | ICD-10-CM

## 2017-05-30 DIAGNOSIS — M85852 Other specified disorders of bone density and structure, left thigh: Secondary | ICD-10-CM | POA: Diagnosis not present

## 2017-06-04 ENCOUNTER — Telehealth: Payer: Self-pay | Admitting: Family Medicine

## 2017-06-04 NOTE — Telephone Encounter (Signed)
Results and recommendations of bone density given to pt based on the notes of Dr. Etter Sjogren. Pt verbalized understanding and has no additional concerns at this time.

## 2017-06-13 DIAGNOSIS — K080 Exfoliation of teeth due to systemic causes: Secondary | ICD-10-CM | POA: Diagnosis not present

## 2017-07-17 ENCOUNTER — Ambulatory Visit: Payer: BLUE CROSS/BLUE SHIELD

## 2017-07-24 DIAGNOSIS — K080 Exfoliation of teeth due to systemic causes: Secondary | ICD-10-CM | POA: Diagnosis not present

## 2017-10-02 ENCOUNTER — Encounter: Payer: Self-pay | Admitting: Gastroenterology

## 2017-10-11 ENCOUNTER — Encounter: Payer: Self-pay | Admitting: Gastroenterology

## 2017-11-07 HISTORY — PX: GUM SURGERY: SHX658

## 2017-12-07 ENCOUNTER — Encounter: Payer: BLUE CROSS/BLUE SHIELD | Admitting: Gastroenterology

## 2018-01-12 ENCOUNTER — Other Ambulatory Visit: Payer: Self-pay | Admitting: Family Medicine

## 2018-01-12 DIAGNOSIS — I10 Essential (primary) hypertension: Secondary | ICD-10-CM

## 2018-01-18 ENCOUNTER — Other Ambulatory Visit: Payer: Self-pay | Admitting: Family Medicine

## 2018-01-21 ENCOUNTER — Ambulatory Visit (AMBULATORY_SURGERY_CENTER): Payer: Self-pay | Admitting: *Deleted

## 2018-01-21 VITALS — Ht 61.0 in | Wt 114.8 lb

## 2018-01-21 DIAGNOSIS — Z1211 Encounter for screening for malignant neoplasm of colon: Secondary | ICD-10-CM

## 2018-01-21 MED ORDER — PEG 3350-KCL-NA BICARB-NACL 420 G PO SOLR: 4000.0000 mL | Freq: Once | ORAL | 0 refills | Status: AC

## 2018-01-21 NOTE — Progress Notes (Signed)
Denies allergies to eggs or soy products. Denies complications with sedation or anesthesia. Denies O2 use. Denies use of diet or weight loss medications.  Emmi instructions given for colonoscopy.  

## 2018-02-08 ENCOUNTER — Encounter: Payer: BLUE CROSS/BLUE SHIELD | Admitting: Gastroenterology

## 2018-02-14 ENCOUNTER — Other Ambulatory Visit: Payer: Self-pay | Admitting: Family Medicine

## 2018-02-14 ENCOUNTER — Encounter: Payer: Self-pay | Admitting: Gastroenterology

## 2018-02-14 DIAGNOSIS — Z1231 Encounter for screening mammogram for malignant neoplasm of breast: Secondary | ICD-10-CM

## 2018-03-19 ENCOUNTER — Ambulatory Visit
Admission: RE | Admit: 2018-03-19 | Discharge: 2018-03-19 | Disposition: A | Discharge status: Home / Self Care | Payer: BLUE CROSS/BLUE SHIELD | Source: Ambulatory Visit | Attending: Family Medicine | Admitting: Family Medicine

## 2018-03-19 DIAGNOSIS — Z1231 Encounter for screening mammogram for malignant neoplasm of breast: Secondary | ICD-10-CM | POA: Diagnosis not present

## 2018-04-02 ENCOUNTER — Encounter: Payer: BLUE CROSS/BLUE SHIELD | Admitting: Gastroenterology

## 2018-04-06 ENCOUNTER — Other Ambulatory Visit: Payer: Self-pay | Admitting: Family Medicine

## 2018-04-06 DIAGNOSIS — M8580 Other specified disorders of bone density and structure, unspecified site: Secondary | ICD-10-CM

## 2018-04-06 DIAGNOSIS — E2839 Other primary ovarian failure: Secondary | ICD-10-CM

## 2018-04-12 ENCOUNTER — Ambulatory Visit (AMBULATORY_SURGERY_CENTER): Payer: Self-pay | Admitting: *Deleted

## 2018-04-12 VITALS — Ht 61.0 in | Wt 113.0 lb

## 2018-04-12 DIAGNOSIS — Z1211 Encounter for screening for malignant neoplasm of colon: Secondary | ICD-10-CM

## 2018-04-12 NOTE — Progress Notes (Signed)
Patient states no medical or surgical changes since PV in July. Patient denies any allergies to eggs or soy. Patient denies any problems with anesthesia/sedation. Patient denies any oxygen use at home. Patient denies taking any diet/weight loss medications or blood thinners. Patient already has Golytely at home.

## 2018-04-15 ENCOUNTER — Encounter: Payer: Self-pay | Admitting: Gastroenterology

## 2018-04-16 ENCOUNTER — Other Ambulatory Visit: Payer: Self-pay | Admitting: Family Medicine

## 2018-04-26 ENCOUNTER — Ambulatory Visit (AMBULATORY_SURGERY_CENTER): Payer: BLUE CROSS/BLUE SHIELD | Admitting: Gastroenterology

## 2018-04-26 ENCOUNTER — Encounter: Payer: Self-pay | Admitting: Gastroenterology

## 2018-04-26 VITALS — BP 100/64 | HR 63 | Temp 97.5°F | Resp 13 | Ht 61.0 in | Wt 116.0 lb

## 2018-04-26 DIAGNOSIS — K5730 Diverticulosis of large intestine without perforation or abscess without bleeding: Secondary | ICD-10-CM

## 2018-04-26 DIAGNOSIS — Z1211 Encounter for screening for malignant neoplasm of colon: Secondary | ICD-10-CM

## 2018-04-26 MED ORDER — SODIUM CHLORIDE 0.9 % IV SOLN: 500.0000 mL | Freq: Once | INTRAVENOUS | Status: DC

## 2018-04-26 NOTE — Patient Instructions (Signed)
HANDOUT GIVEN FOR DIVERTICULOSIS  YOU HAD AN ENDOSCOPIC PROCEDURE TODAY AT [facility]:   Refer to the procedure report that was given to you for any specific questions about what was found during the examination.  If the procedure report does not answer your questions, please call your gastroenterologist to clarify.  If you requested that your care partner not be given the details of your procedure findings, then the procedure report has been included in a sealed envelope for you to review at your convenience later.  YOU SHOULD EXPECT: Some feelings of bloating in the abdomen. Passage of more gas than usual.  Walking can help get rid of the air that was put into your GI tract during the procedure and reduce the bloating. If you had a lower endoscopy (such as a colonoscopy or flexible sigmoidoscopy) you may notice spotting of blood in your stool or on the toilet paper. If you underwent a bowel prep for your procedure, you may not have a normal bowel movement for a few days.  Please Note:  You might notice some irritation and congestion in your nose or some drainage.  This is from the oxygen used during your procedure.  There is no need for concern and it should clear up in a day or so.  SYMPTOMS TO REPORT IMMEDIATELY:   Following lower endoscopy (colonoscopy or flexible sigmoidoscopy):  Excessive amounts of blood in the stool  Significant tenderness or worsening of abdominal pains  Swelling of the abdomen that is new, acute  Fever of 100F or higher   For urgent or emergent issues, a gastroenterologist can be reached at any hour by calling [phone].   DIET:  We do recommend a small meal at first, but then you may proceed to your regular diet.  Drink plenty of fluids but you should avoid alcoholic beverages for 24 hours.  ACTIVITY:  You should plan to take it easy for the rest of today and you should NOT DRIVE or use heavy machinery until tomorrow (because of the sedation  medicines used during the test).    FOLLOW UP: Our staff will call the number listed on your records the next business day following your procedure to check on you and address any questions or concerns that you may have regarding the information given to you following your procedure. If we do not reach you, we will leave a message.  However, if you are feeling well and you are not experiencing any problems, there is no need to return our call.  We will assume that you have returned to your regular daily activities without incident.  If any biopsies were taken you will be contacted by phone or by letter within the next 1-3 weeks.  Please call us at [phone] if you have not heard about the biopsies in 3 weeks.    SIGNATURES/CONFIDENTIALITY: You and/or your care partner have signed paperwork which will be entered into your electronic medical record.  These signatures attest to the fact that that the information above on your After Visit Summary has been reviewed and is understood.  Full responsibility of the confidentiality of this discharge information lies with you and/or your care-partner.

## 2018-04-26 NOTE — Progress Notes (Signed)
To PACU, VSS. Report to [email or web address].tb 

## 2018-04-26 NOTE — Progress Notes (Signed)
Pt's states no medical or surgical changes since previsit or office visit. 

## 2018-04-26 NOTE — Op Note (Signed)
[name] Procedure Date: 04/26/2018 9:55 AM MRN: [id number] Endoscopist: Milus Banister , MD Age: 62 Referring MD:  Date of Birth: 01/13/1956 Gender: Female Account #: [id number] Procedure:                Colonoscopy Indications:              Screening for colorectal malignant neoplasm Medicines:                Monitored Anesthesia Care Procedure:                Pre-Anesthesia Assessment:                           - Prior to the procedure, a History and Physical                            was performed, and patient medications and                            allergies were reviewed. The patient's tolerance of                            previous anesthesia was also reviewed. The risks                            and benefits of the procedure and the sedation                            options and risks were discussed with the patient.                            All questions were answered, and informed consent                            was obtained. Prior Anticoagulants: The patient has                            taken no previous anticoagulant or antiplatelet                            agents. ASA Grade Assessment: II - A patient with                            mild systemic disease. After reviewing the risks                            and benefits, the patient was deemed in                            satisfactory condition to undergo the procedure.                           After obtaining informed consent, the colonoscope  was passed under direct vision. Throughout the                            procedure, the patient's blood pressure, pulse, and                            oxygen saturations were monitored continuously. The                            Colonoscope was introduced through the anus and                            advanced to the the cecum, identified by                            appendiceal orifice and  ileocecal valve. The                            colonoscopy was performed without difficulty. The                            patient tolerated the procedure well. The quality                            of the bowel preparation was good. The ileocecal                            valve, appendiceal orifice, and rectum were                            photographed. Scope In: 10:00:26 AM Scope Out: 10:14:00 AM Scope Withdrawal Time: 0 hours 9 minutes 1 second  Total Procedure Duration: 0 hours 13 minutes 34 seconds  Findings:                 Multiple small-mouthed diverticula were found in                            the left colon.                           The exam was otherwise without abnormality on                            direct and retroflexion views. Complications:            No immediate complications. Estimated blood loss:                            None. Estimated Blood Loss:     Estimated blood loss: none. Impression:               - Diverticulosis in the left colon.                           - The examination was otherwise normal on direct  and retroflexion views.                           - No polyps or cancers. Recommendation:           - Patient has a contact number available for                            emergencies. The signs and symptoms of potential                            delayed complications were discussed with the                            patient. Return to normal activities tomorrow.                            Written discharge instructions were provided to the                            patient.                           - Resume previous diet.                           - Continue present medications.                           - Repeat colonoscopy in 10 years for screening                            purposes. Milus Banister, MD 04/26/2018 10:15:50 AM This report has been signed electronically.

## 2018-04-29 ENCOUNTER — Telehealth: Payer: Self-pay

## 2018-04-29 NOTE — Telephone Encounter (Signed)
Second post procedure follow up call, no answer 

## 2018-04-29 NOTE — Telephone Encounter (Signed)
Left message, for follow up on machine.

## 2018-06-03 DIAGNOSIS — H903 Sensorineural hearing loss, bilateral: Secondary | ICD-10-CM | POA: Diagnosis not present

## 2018-06-21 ENCOUNTER — Other Ambulatory Visit: Payer: Self-pay | Admitting: Family Medicine

## 2018-06-21 DIAGNOSIS — I10 Essential (primary) hypertension: Secondary | ICD-10-CM

## 2018-06-25 NOTE — Telephone Encounter (Signed)
Requested Prescriptions  Pending Prescriptions Disp Refills  . metoprolol succinate (TOPROL-XL) 50 MG 24 hr tablet [Pharmacy Med Name: METOPROLOL SUCC ER 50 MG TAB] 90 tablet 0    Sig: TAKE ONE TABLET BY MOUTH DAILY. TAKE WITH A MEAL OR IMMEDIATELY FOLLOWING A MEAL.     Cardiovascular:  Beta Blockers Failed - 06/25/2018 12:06 PM      Failed - Valid encounter within last 6 months    Recent Outpatient Visits          1 year ago Preventative health care   [place] at [place], [place]   2 years ago Low sodium levels   [facility] at [place], [place]   2 years ago Diarrhea of presumed infectious origin   [facility] at [place], [place]   2 years ago Preventative health care   [place] at [place], [place], [place]   3 years ago Essential hypertension   [facility] at [place], [employer]      Future Appointments            In 3 months [place], [place] at [employer], [place] BP in normal range    BP Readings from Last 1 Encounters:  04/26/18 100/64         Passed - Last Heart Rate in normal range    Pulse Readings from Last 1 Encounters:  04/26/18 63       . lisinopril (PRINIVIL,ZESTRIL) 40 MG tablet [Pharmacy Med Name: LISINOPRIL 40 MG TABLET] 90 tablet 0    Sig: TAKE 1 TABLET BY MOUTH EVERY DAY     Cardiovascular:  ACE Inhibitors Failed - 06/25/2018 12:06 PM      Failed - Cr in normal range and within 180 days    Creat  Date Value Ref Range Status  11/12/2015 0.80 0.50 - 1.05 mg/dL Final   Creatinine, Ser  Date Value Ref Range Status  04/30/2017 0.73 0.40 - 1.20 mg/dL Final         Failed - K in normal range and within 180 days    Potassium  Date Value Ref Range Status   04/30/2017 4.4 3.5 - 5.1 mEq/L Final         Failed - Valid encounter within last 6 months    Recent Outpatient Visits          1 year ago Preventative health care   [place] at [place], [place]   2 years ago Low sodium levels   [facility] at [place], [place]   2 years ago Diarrhea of presumed infectious origin   [facility] at [place], [place]   2 years ago Preventative health care   [place] at [place], [place], [place]   3 years ago Essential hypertension   [facility] at [place], [employer]      Future Appointments            In 3 months [place] [place] [facility] [employer]  [place], [place] - Patient is not pregnant      Passed - Last BP in normal range    BP Readings from Last 1 Encounters:  04/26/18 100/64

## 2018-06-25 NOTE — Telephone Encounter (Signed)
Pharmacy calling to add amLODipine (NORVASC) 5 MG tablet to this request.

## 2018-07-12 ENCOUNTER — Encounter: Payer: BLUE CROSS/BLUE SHIELD | Admitting: Family Medicine

## 2018-07-18 ENCOUNTER — Other Ambulatory Visit: Payer: Self-pay | Admitting: Family Medicine

## 2018-08-12 DIAGNOSIS — H524 Presbyopia: Secondary | ICD-10-CM | POA: Diagnosis not present

## 2018-08-12 DIAGNOSIS — H52201 Unspecified astigmatism, right eye: Secondary | ICD-10-CM | POA: Diagnosis not present

## 2018-08-12 DIAGNOSIS — H2513 Age-related nuclear cataract, bilateral: Secondary | ICD-10-CM | POA: Diagnosis not present

## 2018-08-30 ENCOUNTER — Other Ambulatory Visit: Payer: Self-pay | Admitting: Family Medicine

## 2018-08-30 DIAGNOSIS — I10 Essential (primary) hypertension: Secondary | ICD-10-CM

## 2018-09-06 ENCOUNTER — Encounter: Payer: BLUE CROSS/BLUE SHIELD | Admitting: Family Medicine

## 2018-09-24 ENCOUNTER — Encounter: Payer: BLUE CROSS/BLUE SHIELD | Admitting: Family Medicine

## 2018-10-11 ENCOUNTER — Encounter: Payer: Self-pay | Admitting: Family Medicine

## 2018-10-11 ENCOUNTER — Other Ambulatory Visit: Payer: Self-pay | Admitting: Family Medicine

## 2018-10-11 DIAGNOSIS — I10 Essential (primary) hypertension: Secondary | ICD-10-CM

## 2018-10-23 ENCOUNTER — Encounter: Payer: Self-pay | Admitting: Family Medicine

## 2018-12-31 ENCOUNTER — Other Ambulatory Visit: Payer: Self-pay

## 2018-12-31 ENCOUNTER — Encounter: Payer: BC Managed Care – PPO | Admitting: Family Medicine

## 2018-12-31 VITALS — Ht 61.0 in | Wt 118.0 lb

## 2018-12-31 DIAGNOSIS — I10 Essential (primary) hypertension: Secondary | ICD-10-CM

## 2018-12-31 DIAGNOSIS — Z0000 Encounter for general adult medical examination without abnormal findings: Secondary | ICD-10-CM

## 2018-12-31 NOTE — Progress Notes (Signed)
Virtual Visit via Video Note  I connected with [name] on 12/31/18 at 10:00 AM EDT by a video enabled telemedicine application and verified that I am speaking with the correct person using two identifiers.  Location: Patient:  Provider:     Pt not seen   I discussed the limitations of evaluation and management by telemedicine and the availability of in person appointments. The patient expressed understanding and agreed to proceed.  History of Present Illness:    Observations/Objective:   Assessment and Plan:   Follow Up Instructions:    I discussed the assessment and treatment plan with the patient. The patient was provided an opportunity to ask questions and all were answered. The patient agreed with the plan and demonstrated an understanding of the instructions.   The patient was advised to call back or seek an in-person evaluation if the symptoms worsen or if the condition fails to improve as anticipated.  I provided  minutes of non-face-to-face time during this encounter.   Ann Held, DO This encounter was created in error - please disregard.

## 2019-01-06 ENCOUNTER — Other Ambulatory Visit: Payer: Self-pay | Admitting: Family Medicine

## 2019-01-06 DIAGNOSIS — I10 Essential (primary) hypertension: Secondary | ICD-10-CM

## 2019-01-07 ENCOUNTER — Other Ambulatory Visit: Payer: Self-pay | Admitting: Family Medicine

## 2019-02-03 ENCOUNTER — Encounter: Payer: Self-pay | Admitting: Family Medicine

## 2019-02-03 NOTE — Telephone Encounter (Signed)
I saw this last week and asked that she come in to be seen

## 2019-02-06 ENCOUNTER — Other Ambulatory Visit: Payer: Self-pay

## 2019-02-06 ENCOUNTER — Telehealth: Payer: Self-pay | Admitting: *Deleted

## 2019-02-06 NOTE — Telephone Encounter (Signed)
Copied from [place] [id number]. Topic: General - Other >> Feb 06, 2019  9:27 AM Leward Quan A wrote: Reason for CRM: Patient returned call to Drue Dun can be reached at Ph# [phone]

## 2019-02-06 NOTE — Telephone Encounter (Signed)
LM for pt to call back again to sch appt. She sent mychart request for : Blood pressure med amlodipine change to new med or just discontinue as it is interfering with my oral hygiene

## 2019-02-21 ENCOUNTER — Ambulatory Visit: Payer: BC Managed Care – PPO | Admitting: Family Medicine

## 2019-03-04 ENCOUNTER — Encounter: Payer: Self-pay | Admitting: Family Medicine

## 2019-03-04 ENCOUNTER — Other Ambulatory Visit: Payer: Self-pay

## 2019-03-04 ENCOUNTER — Ambulatory Visit: Payer: BC Managed Care – PPO | Admitting: Family Medicine

## 2019-03-04 VITALS — BP 160/80 | HR 73 | Temp 97.9°F | Resp 18 | Ht 61.0 in | Wt 108.2 lb

## 2019-03-04 DIAGNOSIS — M8580 Other specified disorders of bone density and structure, unspecified site: Secondary | ICD-10-CM

## 2019-03-04 DIAGNOSIS — E2839 Other primary ovarian failure: Secondary | ICD-10-CM

## 2019-03-04 DIAGNOSIS — I10 Essential (primary) hypertension: Secondary | ICD-10-CM

## 2019-03-04 DIAGNOSIS — E871 Hypo-osmolality and hyponatremia: Secondary | ICD-10-CM

## 2019-03-04 DIAGNOSIS — Z23 Encounter for immunization: Secondary | ICD-10-CM

## 2019-03-04 DIAGNOSIS — G459 Transient cerebral ischemic attack, unspecified: Secondary | ICD-10-CM

## 2019-03-04 LAB — COMPREHENSIVE METABOLIC PANEL
ALT: 32 U/L (ref 0–35)
AST: 37 U/L (ref 0–37)
Albumin: 4.8 g/dL (ref 3.5–5.2)
Alkaline Phosphatase: 68 U/L (ref 39–117)
BUN: 8 mg/dL (ref 6–23)
CO2: 25 mEq/L (ref 19–32)
Calcium: 10.1 mg/dL (ref 8.4–10.5)
Chloride: 90 mEq/L — ABNORMAL LOW (ref 96–112)
Creatinine, Ser: 0.68 mg/dL (ref 0.40–1.20)
GFR: 87.63 mL/min (ref >60.00)
Glucose, Bld: 90 mg/dL (ref 70–99)
Potassium: 5.1 mEq/L (ref 3.5–5.1)
Sodium: 125 mEq/L — ABNORMAL LOW (ref 135–145)
Total Bilirubin: 0.8 mg/dL (ref 0.2–1.2)
Total Protein: 7.1 g/dL (ref 6.0–8.3)

## 2019-03-04 LAB — CBC WITH DIFFERENTIAL/PLATELET
Basophils Absolute: 0.1 10*3/uL (ref 0.0–0.1)
Basophils Relative: 1 % (ref 0.0–3.0)
Eosinophils Absolute: 0.1 10*3/uL (ref 0.0–0.7)
Eosinophils Relative: 1.2 % (ref 0.0–5.0)
HCT: 36.9 % (ref 36.0–46.0)
Hemoglobin: 12.6 g/dL (ref 12.0–15.0)
Lymphocytes Relative: 15.4 % (ref 12.0–46.0)
Lymphs Abs: 1.2 10*3/uL (ref 0.7–4.0)
MCHC: 34.2 g/dL (ref 30.0–36.0)
MCV: 106.5 fl — ABNORMAL HIGH (ref 78.0–100.0)
Monocytes Absolute: 0.9 10*3/uL (ref 0.1–1.0)
Monocytes Relative: 12 % (ref 3.0–12.0)
Neutro Abs: 5.3 10*3/uL (ref 1.4–7.7)
Neutrophils Relative %: 70.4 % (ref 43.0–77.0)
Platelets: 331 10*3/uL (ref 150.0–400.0)
RBC: 3.46 Mil/uL — ABNORMAL LOW (ref 3.87–5.11)
RDW: 12.2 % (ref 11.5–15.5)
WBC: 7.6 10*3/uL (ref 4.0–10.5)

## 2019-03-04 LAB — LIPID PANEL
Cholesterol: 211 mg/dL — ABNORMAL HIGH (ref 0–200)
HDL: 67.7 mg/dL (ref >39.00)
LDL Cholesterol: 128 mg/dL — ABNORMAL HIGH (ref 0–99)
NonHDL: 143.71
Total CHOL/HDL Ratio: 3
Triglycerides: 79 mg/dL (ref 0.0–149.0)
VLDL: 15.8 mg/dL (ref 0.0–40.0)

## 2019-03-04 MED ORDER — ALENDRONATE SODIUM 70 MG PO TABS: ORAL_TABLET | ORAL | 3 refills | Status: DC

## 2019-03-04 MED ORDER — METOPROLOL SUCCINATE ER 100 MG PO TB24: 100.0000 mg | ORAL_TABLET | Freq: Every day | ORAL | 3 refills | Status: DC

## 2019-03-04 MED ORDER — LISINOPRIL 40 MG PO TABS: 40.0000 mg | ORAL_TABLET | Freq: Every day | ORAL | 1 refills | Status: DC

## 2019-03-04 NOTE — Assessment & Plan Note (Signed)
Poorly controlled will alter medications, encouraged DASH diet, minimize caffeine and obtain adequate sleep. Report concerning symptoms and follow up as directed and as needed 

## 2019-03-04 NOTE — Assessment & Plan Note (Signed)
Check labs 

## 2019-03-04 NOTE — Assessment & Plan Note (Signed)
Stable

## 2019-03-04 NOTE — Progress Notes (Signed)
Patient ID: [name], female    DOB: 12/01/1955  Age: 63 y.o. MRN: [id number]    Subjective:  Subjective  HPI [name] presents for f/u bp and chol.  She is off the norvasc -- periodontist wanted her off it because her gums swelled and her ankles were swelling as well.  She is having major dental work done  Review of Systems  Constitutional: Negative for appetite change, diaphoresis, fatigue and unexpected weight change.  Eyes: Negative for pain, redness and visual disturbance.  Respiratory: Negative for cough, chest tightness, shortness of breath and wheezing.   Cardiovascular: Negative for chest pain, palpitations and leg swelling.  Endocrine: Negative for cold intolerance, heat intolerance, polydipsia, polyphagia and polyuria.  Genitourinary: Negative for difficulty urinating, dysuria and frequency.  Neurological: Negative for dizziness, light-headedness, numbness and headaches.    History Past Medical History:  Diagnosis Date  . Anxiety   . Cancer ([place]) 04/2014   squamous cell carcinoma L inner thigh  . Depression   . Hard of hearing   . Hypertension   . Osteopenia   . Substance abuse ([place])   . TIA (transient ischemic attack) 2016    She has a past surgical history that includes Nose surgery; Breast enhancement surgery; Bunionectomy; Tonsillectomy; Augmentation mammaplasty (Bilateral, 1988); and Gum surgery (11/2017).   Her family history includes Alzheimer's disease in her mother; Hyperlipidemia in her father; Hypertension in her mother; Kidney disease in her mother; Stroke in her paternal grandmother.She reports that she has never smoked. She has never used smokeless tobacco. She reports that she does not drink alcohol or use drugs.  Current Outpatient Medications on File Prior to Visit  Medication Sig Dispense Refill  . Coenzyme Q10 (COQ10) 100 MG CAPS Take 1 tablet by mouth daily.     . Cyanocobalamin (VITAMIN B-12 PO) Take 1 tablet by mouth daily.     . metoprolol succinate (TOPROL-XL) 50 MG 24 hr tablet TAKE ONE TABLET BY MOUTH DAILY. TAKE WITH A MEAL OR IMMEDIATELY FOLLOWING A MEAL. 90 tablet 0  . Multiple Vitamin (MULTIVITAMIN) capsule Take by mouth.    . Omega-3 Fatty Acids (FISH OIL) 1200 MG CAPS Take 1,200 mg by mouth daily.      No current facility-administered medications on file prior to visit.      Objective:  Objective  Physical Exam Vitals signs and nursing note reviewed.  Constitutional:      Appearance: She is well-developed.  HENT:     Head: Normocephalic and atraumatic.  Eyes:     Conjunctiva/sclera: Conjunctivae normal.  Neck:     Musculoskeletal: Normal range of motion and neck supple.     Thyroid: No thyromegaly.     Vascular: No carotid bruit or JVD.  Cardiovascular:     Rate and Rhythm: Normal rate and regular rhythm.     Heart sounds: Normal heart sounds. No murmur.  Pulmonary:     Effort: Pulmonary effort is normal. No respiratory distress.     Breath sounds: Normal breath sounds. No wheezing or rales.  Chest:     Chest wall: No tenderness.  Neurological:     Mental Status: She is alert and oriented to person, place, and time.    BP (!) 160/80 (BP Location: Left Arm, Patient Position: Sitting, Cuff Size: Normal)   Pulse 73   Temp 97.9 F (36.6 C) (Temporal)   Resp 18   Ht 5\' 1"  (1.549 m)   Wt 108 lb 3.2 oz (49.1 kg)  SpO2 98%   BMI 20.44 kg/m  Wt Readings from Last 3 Encounters:  03/04/19 108 lb 3.2 oz (49.1 kg)  12/31/18 118 lb (53.5 kg)  04/26/18 116 lb (52.6 kg)     Lab Results  Component Value Date   WBC 7.8 04/30/2017   HGB 13.4 04/30/2017   HCT 39.0 04/30/2017   PLT 381.0 04/30/2017   GLUCOSE 99 04/30/2017   CHOL 215 (H) 04/30/2017   TRIG 114.0 04/30/2017   HDL 64.40 04/30/2017   LDLDIRECT 127.6 03/28/2013   LDLCALC 128 (H) 04/30/2017   ALT 27 04/30/2017   AST 36 04/30/2017   NA 129 (L) 04/30/2017   K 4.4 04/30/2017   CL 91 (L) 04/30/2017   CREATININE 0.73 04/30/2017    BUN 8 04/30/2017   CO2 26 04/30/2017   TSH 1.09 04/30/2017   INR 0.99 06/13/2015   HGBA1C 5.2 06/14/2015   MICROALBUR 1.0 05/29/2014    Mm 3d Screen Breast W/implant Bilateral  Result Date: 03/19/2018 CLINICAL DATA:  Screening. EXAM: DIGITAL SCREENING BILATERAL MAMMOGRAM WITH IMPLANTS, CAD AND TOMO The patient has implants. Standard and implant displaced views were performed. COMPARISON:  Previous exam(s). ACR Breast Density Category c: The breast tissue is heterogeneously dense, which may obscure small masses. FINDINGS: There are no findings suspicious for malignancy. Images were processed with CAD. IMPRESSION: No mammographic evidence of malignancy. A result letter of this screening mammogram will be mailed directly to the patient. RECOMMENDATION: Screening mammogram in one year. (Code:SM-B-01Y) BI-RADS CATEGORY  1:  Negative. Electronically Signed   By: Abelardo Diesel M.D.   On: 03/19/2018 13:25     Assessment & Plan:  Plan  I have discontinued Margarie Kilcullen. [place]'s amLODipine. I have also changed her lisinopril and alendronate. Additionally, I am having her start on metoprolol succinate. Lastly, I am having her maintain her Fish Oil, CoQ10, multivitamin, Cyanocobalamin (VITAMIN B-12 PO), and metoprolol succinate.  Meds ordered this encounter  Medications  . lisinopril (ZESTRIL) 40 MG tablet    Sig: Take 1 tablet (40 mg total) by mouth daily.    Dispense:  90 tablet    Refill:  1  . alendronate (FOSAMAX) 70 MG tablet    Sig: Take with a full glass of water on an empty stomach.    Dispense:  12 tablet    Refill:  3  . metoprolol succinate (TOPROL-XL) 100 MG 24 hr tablet    Sig: Take 1 tablet (100 mg total) by mouth daily. Take with or immediately following a meal.    Dispense:  90 tablet    Refill:  3    Problem List Items Addressed This Visit      Unprioritized   Essential hypertension    Poorly controlled will alter medications, encouraged DASH diet, minimize caffeine and  obtain adequate sleep. Report concerning symptoms and follow up as directed and as needed      Relevant Medications   lisinopril (ZESTRIL) 40 MG tablet   metoprolol succinate (TOPROL-XL) 100 MG 24 hr tablet   Other Relevant Orders   Lipid panel   CBC with Differential/Platelet   Comprehensive metabolic panel   Hyponatremia    Check labs       TIA (transient ischemic attack)    Stable       Relevant Medications   lisinopril (ZESTRIL) 40 MG tablet   metoprolol succinate (TOPROL-XL) 100 MG 24 hr tablet    Other Visit Diagnoses    Need for immunization against influenza    -  Primary   Osteopenia, unspecified location       Relevant Medications   alendronate (FOSAMAX) 70 MG tablet   Estrogen deficiency       Relevant Medications   alendronate (FOSAMAX) 70 MG tablet   Need for influenza vaccination       Relevant Orders   Flu Vaccine QUAD 36+ mos IM (Completed)      Follow-up: Return in about 6 months (around 09/04/2019), or if symptoms worsen or fail to improve, for hypertension, hyperlipidemia, diabetes II.  Ann Held, DO

## 2019-03-06 ENCOUNTER — Encounter: Payer: Self-pay | Admitting: Family Medicine

## 2019-03-10 ENCOUNTER — Telehealth: Payer: Self-pay | Admitting: *Deleted

## 2019-03-10 NOTE — Telephone Encounter (Signed)
Copied from [place] [phone]. Topic: General - Call Back - No Documentation >> Mar 07, 2019  2:36 PM Erick Blinks wrote: Reason for CRM: Requesting call back to discuss lab results. Please advise  Best contact: [phone]

## 2019-03-11 NOTE — Telephone Encounter (Signed)
See labs 

## 2019-03-14 ENCOUNTER — Other Ambulatory Visit: Payer: Self-pay | Admitting: Family Medicine

## 2019-03-14 DIAGNOSIS — I10 Essential (primary) hypertension: Secondary | ICD-10-CM

## 2019-03-19 ENCOUNTER — Encounter: Payer: Self-pay | Admitting: Family Medicine

## 2019-03-19 ENCOUNTER — Ambulatory Visit (INDEPENDENT_AMBULATORY_CARE_PROVIDER_SITE_OTHER): Payer: BC Managed Care – PPO | Admitting: Family Medicine

## 2019-03-19 ENCOUNTER — Other Ambulatory Visit: Payer: Self-pay

## 2019-03-19 DIAGNOSIS — I10 Essential (primary) hypertension: Secondary | ICD-10-CM | POA: Diagnosis not present

## 2019-03-19 NOTE — Assessment & Plan Note (Signed)
con't meds  Pt will get a new bp monitor and call us / or send my chart message with new readings  F/u 3 months or sooner if new cuff is reading high

## 2019-03-19 NOTE — Progress Notes (Signed)
Virtual Visit via Video Note  I connected with [name] on 03/19/19 at  9:40 AM EDT by a video enabled telemedicine application and verified that I am speaking with the correct person using two identifiers.  Location: Patient: home Provider: home    I discussed the limitations of evaluation and management by telemedicine and the availability of in person appointments. The patient expressed understanding and agreed to proceed.  History of Present Illness: Pt is home  -- her bp has been up and down---- mostly good but she thinks she may need a new bp cuff   Observations/Objective: 147/78  --  Lowest one today---  Pt is in [place]   Assessment and Plan: 1. Essential hypertension con't meds  Pt will get a new bp monitor and call us / or send my chart message with new readings  F/u 3 months or sooner if new cuff is reading high    Follow Up Instructions:    I discussed the assessment and treatment plan with the patient. The patient was provided an opportunity to ask questions and all were answered. The patient agreed with the plan and demonstrated an understanding of the instructions.   The patient was advised to call back or seek an in-person evaluation if the symptoms worsen or if the condition fails to improve as anticipated.  I provided 15 minutes of non-face-to-face time during this encounter.   Ann Held, DO

## 2019-03-20 NOTE — Telephone Encounter (Signed)
If it is consistantly 140/90 I will want to adjust her meds Make sure next ov is in person with her cuff

## 2019-04-04 ENCOUNTER — Ambulatory Visit: Payer: BC Managed Care – PPO | Admitting: Family Medicine

## 2019-04-07 ENCOUNTER — Other Ambulatory Visit: Payer: Self-pay

## 2019-04-07 ENCOUNTER — Encounter: Payer: Self-pay | Admitting: Family Medicine

## 2019-04-07 ENCOUNTER — Ambulatory Visit: Payer: BC Managed Care – PPO | Admitting: Family Medicine

## 2019-04-07 VITALS — BP 152/90 | HR 71 | Temp 97.4°F | Resp 18 | Ht 61.0 in | Wt 111.0 lb

## 2019-04-07 DIAGNOSIS — M2560 Stiffness of unspecified joint, not elsewhere classified: Secondary | ICD-10-CM | POA: Diagnosis not present

## 2019-04-07 LAB — SEDIMENTATION RATE: Sed Rate: 13 mm/hr (ref 0–30)

## 2019-04-07 LAB — TSH: TSH: 0.75 u[IU]/mL (ref 0.35–4.50)

## 2019-04-07 NOTE — Patient Instructions (Signed)
Arthritis Arthritis is a term that is commonly used to refer to joint pain or joint disease. There are more than 100 types of arthritis. What are the causes? The most common cause of this condition is wear and tear of a joint. Other causes include:  Gout.  Inflammation of a joint.  An infection of a joint.  Sprains and other injuries near the joint.  A reaction to medicines or drugs, or an allergic reaction. In some cases, the cause may not be known. What are the signs or symptoms? The main symptom of this condition is pain in the joint during movement. Other symptoms include:  Redness, swelling, or stiffness at a joint.  Warmth coming from the joint.  Fever.  Overall feeling of illness. How is this diagnosed? This condition may be diagnosed with a physical exam and tests, including:  Blood tests.  Urine tests.  Imaging tests, such as X-rays, an MRI, or a CT scan. Sometimes, fluid is removed from a joint for testing. How is this treated? This condition may be treated with:  Treatment of the cause, if it is known.  Rest.  Raising (elevating) the joint.  Applying cold or hot packs to the joint.  Medicines to improve symptoms and reduce inflammation.  Injections of a steroid such as cortisone into the joint to help reduce pain and inflammation. Depending on the cause of your arthritis, you may need to make lifestyle changes to reduce stress on your joint. Changes may include:  Exercising more.  Losing weight. Follow these instructions at home: Medicines  Take over-the-counter and prescription medicines only as told by your health care provider.  Do not take aspirin to relieve pain if your health care provider thinks that gout may be causing your pain. Activity  Rest your joint if told by your health care provider. Rest is important when your disease is active and your joint feels painful, swollen, or stiff.  Avoid activities that make the pain worse. It is  important to balance activity with rest.  Exercise your joint regularly with range-of-motion exercises as told by your health care provider. Try doing low-impact exercise, such as: ? Swimming. ? Water aerobics. ? Biking. ? Walking. Managing pain, stiffness, and swelling      If directed, put ice on the joint. ? Put ice in a plastic bag. ? Place a towel between your skin and the bag. ? Leave the ice on for 20 minutes, 2-3 times per day.  If your joint is swollen, raise (elevate) it above the level of your heart if directed by your health care provider.  If your joint feels stiff in the morning, try taking a warm shower.  If directed, apply heat to the affected area as often as told by your health care provider. Use the heat source that your health care provider recommends, such as a moist heat pack or a heating pad. If you have diabetes, do not apply heat without permission from your health care provider. To apply heat: ? Place a towel between your skin and the heat source. ? Leave the heat on for 20-30 minutes. ? Remove the heat if your skin turns bright red. This is especially important if you are unable to feel pain, heat, or cold. You may have a greater risk of getting burned. General instructions  Do not use any products that contain nicotine or tobacco, such as cigarettes, e-cigarettes, and chewing tobacco. If you need help quitting, ask your health care provider.  Keep   all follow-up visits as told by your health care provider. This is important. Contact a health care provider if:  The pain gets worse.  You have a fever. Get help right away if:  You develop severe joint pain, swelling, or redness.  Many joints become painful and swollen.  You develop severe back pain.  You develop severe weakness in your leg.  You cannot control your bladder or bowels. Summary  Arthritis is a term that is commonly used to refer to joint pain or joint disease. There are more than  100 types of arthritis.  The most common cause of this condition is wear and tear of a joint. Other causes include gout, inflammation or infection of the joint, sprains, or allergies.  Symptoms of this condition include redness, swelling, or stiffness of the joint. Other symptoms include warmth, fever, or feeling ill.  This condition is treated with rest, elevation, medicines, and applying cold or hot packs.  Follow your health care provider's instructions about medicines, activity, exercises, and other home care treatments. This information is not intended to replace advice given to you by your health care provider. Make sure you discuss any questions you have with your health care provider. Document Released: 08/03/2004 Document Revised: 06/03/2018 Document Reviewed: 06/03/2018 Elsevier Patient Education  2020 Elsevier Inc.  

## 2019-04-07 NOTE — Progress Notes (Signed)
Patient ID: [name], female    DOB: August 12, 1955  Age: 63 y.o. MRN: [id number]    Subjective:  Subjective  HPI [name] presents for c/o stiffness in arms and legs ---she has a family hx of rheumatoid arthritis   Review of Systems  Constitutional: Negative for appetite change, diaphoresis, fatigue and unexpected weight change.  Eyes: Negative for pain, redness and visual disturbance.  Respiratory: Negative for cough, chest tightness, shortness of breath and wheezing.   Cardiovascular: Negative for chest pain, palpitations and leg swelling.  Endocrine: Negative for cold intolerance, heat intolerance, polydipsia, polyphagia and polyuria.  Genitourinary: Negative for difficulty urinating, dysuria and frequency.  Musculoskeletal: Positive for arthralgias and myalgias.  Neurological: Negative for dizziness, light-headedness, numbness and headaches.    History Past Medical History:  Diagnosis Date  . Anxiety   . Cancer ([place]) 04/2014   squamous cell carcinoma L inner thigh  . Depression   . Hard of hearing   . Hypertension   . Osteopenia   . Substance abuse ([place])   . TIA (transient ischemic attack) 2016    She has a past surgical history that includes Nose surgery; Breast enhancement surgery; Bunionectomy; Tonsillectomy; Augmentation mammaplasty (Bilateral, 1988); and Gum surgery (11/2017).   Her family history includes Alzheimer's disease in her mother; Hyperlipidemia in her father; Hypertension in her mother; Kidney disease in her mother; Rheum arthritis in her paternal grandmother; Stroke in her paternal grandmother.She reports that she has never smoked. She has never used smokeless tobacco. She reports that she does not drink alcohol or use drugs.  Current Outpatient Medications on File Prior to Visit  Medication Sig Dispense Refill  . alendronate (FOSAMAX) 70 MG tablet Take with a full glass of water on an empty stomach. 12 tablet 3  . Coenzyme Q10 (COQ10) 100 MG  CAPS Take 1 tablet by mouth daily.     . Cyanocobalamin (VITAMIN B-12 PO) Take 1 tablet by mouth daily.    Marland Kitchen lisinopril (ZESTRIL) 40 MG tablet Take 1 tablet (40 mg total) by mouth daily. 90 tablet 1  . metoprolol succinate (TOPROL-XL) 100 MG 24 hr tablet Take 1 tablet (100 mg total) by mouth daily. Take with or immediately following a meal. 90 tablet 3  . metoprolol succinate (TOPROL-XL) 50 MG 24 hr tablet TAKE ONE TABLET BY MOUTH DAILY. TAKE WITH A MEAL OR IMMEDIATELY FOLLOWING A MEAL. 90 tablet 0  . Multiple Vitamin (MULTIVITAMIN) capsule Take by mouth.    . Omega-3 Fatty Acids (FISH OIL) 1200 MG CAPS Take 1,200 mg by mouth daily.      No current facility-administered medications on file prior to visit.      Objective:  Objective  Physical Exam Vitals signs and nursing note reviewed.  Constitutional:      Appearance: She is well-developed.  HENT:     Head: Normocephalic and atraumatic.  Eyes:     Conjunctiva/sclera: Conjunctivae normal.  Neck:     Musculoskeletal: Normal range of motion and neck supple.     Thyroid: No thyromegaly.     Vascular: No carotid bruit or JVD.  Cardiovascular:     Rate and Rhythm: Normal rate and regular rhythm.     Heart sounds: Normal heart sounds. No murmur.  Pulmonary:     Effort: Pulmonary effort is normal. No respiratory distress.     Breath sounds: Normal breath sounds. No wheezing or rales.  Chest:     Chest wall: No tenderness.  Musculoskeletal:  General: Swelling present. No tenderness.     Comments: Stiffness in legs and arms   Neurological:     Mental Status: She is alert and oriented to person, place, and time.    BP (!) 152/90 (BP Location: Right Arm, Patient Position: Sitting, Cuff Size: Normal)   Pulse 71   Temp (!) 97.4 F (36.3 C) (Temporal)   Resp 18   Ht 5\' 1"  (1.549 m)   Wt 111 lb (50.3 kg)   SpO2 99%   BMI 20.97 kg/m  Wt Readings from Last 3 Encounters:  04/07/19 111 lb (50.3 kg)  03/04/19 108 lb 3.2 oz (49.1  kg)  12/31/18 118 lb (53.5 kg)     Lab Results  Component Value Date   WBC 7.6 03/04/2019   HGB 12.6 03/04/2019   HCT 36.9 03/04/2019   PLT 331.0 03/04/2019   GLUCOSE 90 03/04/2019   CHOL 211 (H) 03/04/2019   TRIG 79.0 03/04/2019   HDL 67.70 03/04/2019   LDLDIRECT 127.6 03/28/2013   LDLCALC 128 (H) 03/04/2019   ALT 32 03/04/2019   AST 37 03/04/2019   NA 125 (L) 03/04/2019   K 5.1 03/04/2019   CL 90 (L) 03/04/2019   CREATININE 0.68 03/04/2019   BUN 8 03/04/2019   CO2 25 03/04/2019   TSH 1.09 04/30/2017   INR 0.99 06/13/2015   HGBA1C 5.2 06/14/2015   MICROALBUR 1.0 05/29/2014    Mm 3d Screen Breast W/implant Bilateral  Result Date: 03/19/2018 CLINICAL DATA:  Screening. EXAM: DIGITAL SCREENING BILATERAL MAMMOGRAM WITH IMPLANTS, CAD AND TOMO The patient has implants. Standard and implant displaced views were performed. COMPARISON:  Previous exam(s). ACR Breast Density Category c: The breast tissue is heterogeneously dense, which may obscure small masses. FINDINGS: There are no findings suspicious for malignancy. Images were processed with CAD. IMPRESSION: No mammographic evidence of malignancy. A result letter of this screening mammogram will be mailed directly to the patient. RECOMMENDATION: Screening mammogram in one year. (Code:SM-B-01Y) BI-RADS CATEGORY  1:  Negative. Electronically Signed   By: Abelardo Diesel M.D.   On: 03/19/2018 13:25     Assessment & Plan:  Plan  I am having Mickle Mallory maintain her Fish Oil, CoQ10, multivitamin, Cyanocobalamin (VITAMIN B-12 PO), metoprolol succinate, lisinopril, alendronate, and metoprolol succinate.  No orders of the defined types were placed in this encounter.   Problem List Items Addressed This Visit    None    Visit Diagnoses    Joint stiffness of multiple sites    -  Primary   Relevant Orders   TSH   Antinuclear Antib (ANA)   Rheumatoid Factor   Sedimentation rate    tylenol arthritis Consider rheum referral    Follow-up: Return if symptoms worsen or fail to improve.  Ann Held, DO

## 2019-04-10 ENCOUNTER — Other Ambulatory Visit: Payer: Self-pay

## 2019-04-10 DIAGNOSIS — R768 Other specified abnormal immunological findings in serum: Secondary | ICD-10-CM

## 2019-04-10 LAB — RHEUMATOID FACTOR: Rhuematoid fact SerPl-aCnc: 14 IU/mL — ABNORMAL HIGH (ref <14)

## 2019-04-10 LAB — ANA: Anti Nuclear Antibody (ANA): NEGATIVE

## 2019-05-05 NOTE — Progress Notes (Deleted)
Office Visit Note  Patient: [name]             Date of Birth: 01-30-1956           MRN: [id number]             PCP: Ann Held, DO Referring: Ann Held, * Visit Date: 05/19/2019 Occupation: @GUAROCC @  Subjective:  No chief complaint on file.   History of Present Illness: [name] is a 63 y.o. female ***   Activities of Daily Living:  Patient reports morning stiffness for *** {minute/hour:19697}.   Patient {ACTIONS;DENIES/REPORTS:21021675::"Denies"} nocturnal pain.  Difficulty dressing/grooming: {ACTIONS;DENIES/REPORTS:21021675::"Denies"} Difficulty climbing stairs: {ACTIONS;DENIES/REPORTS:21021675::"Denies"} Difficulty getting out of chair: {ACTIONS;DENIES/REPORTS:21021675::"Denies"} Difficulty using hands for taps, buttons, cutlery, and/or writing: {ACTIONS;DENIES/REPORTS:21021675::"Denies"}  No Rheumatology ROS completed.   PMFS History:  Patient Active Problem List   Diagnosis Date Noted  . Alcohol withdrawal ([place]) 10/09/2015  . Nausea, vomiting and diarrhea 10/07/2015  . Diarrhea 10/07/2015  . Anxiety   . Acute ischemic stroke ([place])   . Left leg weakness 06/13/2015  . TIA (transient ischemic attack) 06/13/2015  . Hyponatremia 06/13/2015  . 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 H/O corneal ulcer 09/24/2012  . Bronchitis 06/14/2012  . Error, refractive, myopia 02/29/2012  . Presbyopia 01/25/2012  . H/O ETOH abuse 09/13/2011  . DYSURIA 10/22/2009  . PROTEINURIA, MILD 04/21/2009  . ABNORMAL FINDINGS GI TRACT 12/03/2007  . Essential hypertension 10/22/2007    Past Medical History:  Diagnosis Date  . Anxiety   . Cancer ([place]) 04/2014   squamous cell carcinoma L inner thigh  . Depression   . Hard of hearing   . Hypertension   . Osteopenia   . Substance abuse ([place])   . TIA (transient ischemic attack) 2016    Family History  Problem Relation Age of Onset  . Hypertension Mother   . Kidney disease Mother        dialysis  .  Alzheimer's disease Mother   . Hyperlipidemia Father   . Stroke Paternal Grandmother   . Rheum arthritis Paternal Grandmother   . Breast cancer Neg Hx   . Colon cancer Neg Hx   . Esophageal cancer Neg Hx   . Rectal cancer Neg Hx   . Stomach cancer Neg Hx    Past Surgical History:  Procedure Laterality Date  . AUGMENTATION MAMMAPLASTY Bilateral 1988  . BREAST ENHANCEMENT SURGERY    . BUNIONECTOMY    . GUM SURGERY  11/2017  . NOSE SURGERY     Epistaxis  . TONSILLECTOMY     Social History   Social History Narrative   Exercise--- gym 6x a week   Immunization History  Administered Date(s) Administered  . Hep A / Hep B 11/02/2009, 06/30/2014, 09/01/2014  . Influenza Split 05/15/2012  . Influenza,inj,Quad PF,6+ Mos 03/28/2013, 03/13/2014, 04/11/2016, 04/30/2017, 04/12/2018, 03/04/2019  . PPD Test 05/12/2013, 05/29/2014, 05/26/2015  . Td 10/21/2003  . Tdap 05/29/2014, 03/18/2019  . Zoster Recombinat (Shingrix) 03/18/2019     Objective: Vital Signs: There were no vitals taken for this visit.   Physical Exam   Musculoskeletal Exam: ***  CDAI Exam: CDAI Score: - Patient Global: -; Provider Global: - Swollen: -; Tender: - Joint Exam   No joint exam has been documented for this visit   There is currently no information documented on the homunculus. Go to the Rheumatology activity and complete the homunculus joint exam.  Investigation: Findings:  04/07/19: ANA-, RF 14, sed rate 13,  TSH 0.75  Component     Latest Ref Rng & Units 04/07/2019  TSH     0.35 - 4.50 uIU/mL 0.75  Anti Nuclear Antibody (ANA)     NEGATIVE NEGATIVE  RA Latex Turbid.     <14 IU/mL 14 (H)  Sed Rate     0 - 30 mm/hr 13   Imaging: No results found.  Recent Labs: Lab Results  Component Value Date   WBC 7.6 03/04/2019   HGB 12.6 03/04/2019   PLT 331.0 03/04/2019   NA 125 (L) 03/04/2019   K 5.1 03/04/2019   CL 90 (L) 03/04/2019   CO2 25 03/04/2019   GLUCOSE 90 03/04/2019   BUN 8  03/04/2019   CREATININE 0.68 03/04/2019   BILITOT 0.8 03/04/2019   ALKPHOS 68 03/04/2019   AST 37 03/04/2019   ALT 32 03/04/2019   PROT 7.1 03/04/2019   ALBUMIN 4.8 03/04/2019   CALCIUM 10.1 03/04/2019   GFRAA >60 10/09/2015    Speciality Comments: No specialty comments available.  Procedures:  No procedures performed Allergies: Codeine   Assessment / Plan:     Visit Diagnoses: No diagnosis found.  Orders: No orders of the defined types were placed in this encounter.  No orders of the defined types were placed in this encounter.   Face-to-face time spent with patient was *** minutes. Greater than 50% of time was spent in counseling and coordination of care.  Follow-Up Instructions: No follow-ups on file.   Ofilia Neas, PA-C  Note - This record has been created using Dragon software.  Chart creation errors have been sought, but may not always  have been located. Such creation errors do not reflect on  the standard of medical care.

## 2019-05-19 ENCOUNTER — Ambulatory Visit: Payer: BC Managed Care – PPO | Admitting: Rheumatology

## 2019-05-19 DIAGNOSIS — H524 Presbyopia: Secondary | ICD-10-CM

## 2019-05-19 DIAGNOSIS — G459 Transient cerebral ischemic attack, unspecified: Secondary | ICD-10-CM

## 2019-05-19 DIAGNOSIS — Z8673 Personal history of transient ischemic attack (TIA), and cerebral infarction without residual deficits: Secondary | ICD-10-CM

## 2019-05-20 ENCOUNTER — Ambulatory Visit: Payer: BC Managed Care – PPO | Admitting: Rheumatology

## 2019-06-05 ENCOUNTER — Other Ambulatory Visit: Payer: Self-pay | Admitting: Family Medicine

## 2019-06-06 ENCOUNTER — Other Ambulatory Visit: Payer: Self-pay | Admitting: Family Medicine

## 2019-06-06 DIAGNOSIS — I10 Essential (primary) hypertension: Secondary | ICD-10-CM

## 2019-06-09 ENCOUNTER — Other Ambulatory Visit: Payer: Self-pay | Admitting: *Deleted

## 2019-06-10 ENCOUNTER — Other Ambulatory Visit: Payer: Self-pay | Admitting: Family Medicine

## 2019-06-10 DIAGNOSIS — Z1231 Encounter for screening mammogram for malignant neoplasm of breast: Secondary | ICD-10-CM

## 2019-06-12 ENCOUNTER — Ambulatory Visit: Payer: BC Managed Care – PPO | Admitting: Rheumatology

## 2019-08-01 ENCOUNTER — Other Ambulatory Visit: Payer: Self-pay

## 2019-08-01 ENCOUNTER — Ambulatory Visit
Admission: RE | Admit: 2019-08-01 | Discharge: 2019-08-01 | Disposition: A | Discharge status: Home / Self Care | Payer: BC Managed Care – PPO | Source: Ambulatory Visit | Attending: Family Medicine | Admitting: Family Medicine

## 2019-08-01 DIAGNOSIS — Z1231 Encounter for screening mammogram for malignant neoplasm of breast: Secondary | ICD-10-CM

## 2019-09-08 ENCOUNTER — Ambulatory Visit: Payer: BC Managed Care – PPO | Admitting: Family Medicine

## 2019-09-12 ENCOUNTER — Ambulatory Visit: Payer: BC Managed Care – PPO | Admitting: Family Medicine

## 2019-10-06 ENCOUNTER — Ambulatory Visit: Payer: BC Managed Care – PPO | Admitting: Rheumatology

## 2019-10-26 ENCOUNTER — Other Ambulatory Visit: Payer: Self-pay | Admitting: Family Medicine

## 2020-01-03 ENCOUNTER — Other Ambulatory Visit: Payer: Self-pay | Admitting: Family Medicine

## 2020-01-03 DIAGNOSIS — I10 Essential (primary) hypertension: Secondary | ICD-10-CM

## 2020-01-05 ENCOUNTER — Encounter: Payer: Self-pay | Admitting: Family Medicine

## 2020-01-05 DIAGNOSIS — R768 Other specified abnormal immunological findings in serum: Secondary | ICD-10-CM

## 2020-01-28 ENCOUNTER — Other Ambulatory Visit: Payer: Self-pay | Admitting: Family Medicine

## 2020-01-28 DIAGNOSIS — I10 Essential (primary) hypertension: Secondary | ICD-10-CM

## 2020-02-09 DIAGNOSIS — M6289 Other specified disorders of muscle: Secondary | ICD-10-CM | POA: Diagnosis not present

## 2020-02-09 DIAGNOSIS — M79605 Pain in left leg: Secondary | ICD-10-CM | POA: Diagnosis not present

## 2020-02-09 DIAGNOSIS — M79604 Pain in right leg: Secondary | ICD-10-CM | POA: Diagnosis not present

## 2020-02-26 ENCOUNTER — Other Ambulatory Visit: Payer: Self-pay | Admitting: Family Medicine

## 2020-02-26 DIAGNOSIS — I10 Essential (primary) hypertension: Secondary | ICD-10-CM

## 2020-02-27 ENCOUNTER — Encounter: Payer: BC Managed Care – PPO | Admitting: Family Medicine

## 2020-02-29 ENCOUNTER — Other Ambulatory Visit: Payer: Self-pay | Admitting: Family Medicine

## 2020-02-29 DIAGNOSIS — I10 Essential (primary) hypertension: Secondary | ICD-10-CM

## 2020-03-09 DIAGNOSIS — Z03818 Encounter for observation for suspected exposure to other biological agents ruled out: Secondary | ICD-10-CM | POA: Diagnosis not present

## 2020-03-09 DIAGNOSIS — Z20822 Contact with and (suspected) exposure to covid-19: Secondary | ICD-10-CM | POA: Diagnosis not present

## 2020-03-16 ENCOUNTER — Encounter: Payer: BC Managed Care – PPO | Admitting: Family Medicine

## 2020-03-22 ENCOUNTER — Encounter: Payer: Self-pay | Admitting: Family Medicine

## 2020-03-22 ENCOUNTER — Ambulatory Visit (INDEPENDENT_AMBULATORY_CARE_PROVIDER_SITE_OTHER): Payer: BC Managed Care – PPO | Admitting: Family Medicine

## 2020-03-22 ENCOUNTER — Other Ambulatory Visit: Payer: Self-pay

## 2020-03-22 VITALS — BP 140/90 | HR 66 | Temp 99.3°F | Resp 18 | Ht 61.0 in | Wt 111.2 lb

## 2020-03-22 DIAGNOSIS — G459 Transient cerebral ischemic attack, unspecified: Secondary | ICD-10-CM

## 2020-03-22 DIAGNOSIS — I10 Essential (primary) hypertension: Secondary | ICD-10-CM | POA: Diagnosis not present

## 2020-03-22 DIAGNOSIS — E2839 Other primary ovarian failure: Secondary | ICD-10-CM

## 2020-03-22 DIAGNOSIS — R270 Ataxia, unspecified: Secondary | ICD-10-CM | POA: Diagnosis not present

## 2020-03-22 DIAGNOSIS — E785 Hyperlipidemia, unspecified: Secondary | ICD-10-CM

## 2020-03-22 DIAGNOSIS — R296 Repeated falls: Secondary | ICD-10-CM

## 2020-03-22 DIAGNOSIS — M8580 Other specified disorders of bone density and structure, unspecified site: Secondary | ICD-10-CM

## 2020-03-22 DIAGNOSIS — F1011 Alcohol abuse, in remission: Secondary | ICD-10-CM

## 2020-03-22 DIAGNOSIS — Z23 Encounter for immunization: Secondary | ICD-10-CM

## 2020-03-22 MED ORDER — ALENDRONATE SODIUM 70 MG PO TABS: ORAL_TABLET | ORAL | 3 refills | Status: DC

## 2020-03-22 MED ORDER — METOPROLOL SUCCINATE ER 100 MG PO TB24: 100.0000 mg | ORAL_TABLET | Freq: Every day | ORAL | 3 refills | Status: DC

## 2020-03-22 MED ORDER — LISINOPRIL 40 MG PO TABS: 40.0000 mg | ORAL_TABLET | Freq: Every day | ORAL | 1 refills | Status: DC

## 2020-03-22 NOTE — Assessment & Plan Note (Signed)
Refer to neuro  ? parkinsons  [place]

## 2020-03-22 NOTE — Assessment & Plan Note (Signed)
Slightly elevated today but pt is anxious today She did not want to be here  con't meds

## 2020-03-22 NOTE — Progress Notes (Signed)
Patient ID: [name], female    DOB: Nov 14, 1955  Age: 64 y.o. MRN: [id number]    Subjective:  Subjective  HPI [name] presents for cpe but would like to not do that today.   She has been falling a lot and has trouble walking.  She needs a neuro referral .  She saw Rheum for + rheum factor ---- but was told it was not RA and her problem was neurological.  She saw neuro in the past but would like to see someone else Pt is still drinking alcohol     Review of Systems  Constitutional: Negative for appetite change, diaphoresis, fatigue and unexpected weight change.  Eyes: Negative for pain, redness and visual disturbance.  Respiratory: Negative for cough, chest tightness, shortness of breath and wheezing.   Cardiovascular: Negative for chest pain, palpitations and leg swelling.  Endocrine: Negative for cold intolerance, heat intolerance, polydipsia, polyphagia and polyuria.  Genitourinary: Negative for difficulty urinating, dysuria and frequency.  Musculoskeletal: Positive for gait problem. Negative for arthralgias and back pain.  Neurological: Negative for dizziness, weakness, light-headedness, numbness and headaches.    History Past Medical History:  Diagnosis Date  . Anxiety   . Cancer ([place]) 04/2014   squamous cell carcinoma L inner thigh  . Depression   . Hard of hearing   . Hypertension   . Osteopenia   . Substance abuse ([place])   . TIA (transient ischemic attack) 2016    She has a past surgical history that includes Nose surgery; Breast enhancement surgery; Bunionectomy; Tonsillectomy; Augmentation mammaplasty (Bilateral, 1988); and Gum surgery (11/2017).   Her family history includes Alzheimer's disease in her mother; Hyperlipidemia in her father; Hypertension in her mother; Kidney disease in her mother; Rheum arthritis in her paternal grandmother; Stroke in her paternal grandmother.She reports that she has never smoked. She has never used smokeless tobacco. She  reports that she does not drink alcohol and does not use drugs.  Current Outpatient Medications on File Prior to Visit  Medication Sig Dispense Refill  . Coenzyme Q10 (COQ10) 100 MG CAPS Take 1 tablet by mouth daily.     . Cyanocobalamin (VITAMIN B-12 PO) Take 1 tablet by mouth daily.    . Multiple Vitamin (MULTIVITAMIN) capsule Take by mouth.    . Omega-3 Fatty Acids (FISH OIL) 1200 MG CAPS Take 1,200 mg by mouth daily.      No current facility-administered medications on file prior to visit.     Objective:  Objective  Physical Exam Vitals and nursing note reviewed.  Constitutional:      Appearance: She is well-developed.  HENT:     Head: Normocephalic and atraumatic.  Eyes:     Conjunctiva/sclera: Conjunctivae normal.  Neck:     Thyroid: No thyromegaly.     Vascular: No carotid bruit or JVD.  Cardiovascular:     Rate and Rhythm: Normal rate and regular rhythm.     Heart sounds: Normal heart sounds. No murmur heard.   Pulmonary:     Effort: Pulmonary effort is normal. No respiratory distress.     Breath sounds: Normal breath sounds. No wheezing or rales.  Chest:     Chest wall: No tenderness.  Musculoskeletal:     Cervical back: Normal range of motion and neck supple.       Legs:  Neurological:     Mental Status: She is alert and oriented to person, place, and time.    BP 140/90 (BP Location: Left Arm,  Patient Position: Sitting, Cuff Size: Normal)   Pulse 66   Temp 99.3 F (37.4 C) (Oral)   Resp 18   Ht 5\' 1"  (1.549 m)   Wt 111 lb 3.2 oz (50.4 kg)   SpO2 98%   BMI 21.01 kg/m  Wt Readings from Last 3 Encounters:  03/22/20 111 lb 3.2 oz (50.4 kg)  04/07/19 111 lb (50.3 kg)  03/04/19 108 lb 3.2 oz (49.1 kg)     Lab Results  Component Value Date   WBC 7.6 03/04/2019   HGB 12.6 03/04/2019   HCT 36.9 03/04/2019   PLT 331.0 03/04/2019   GLUCOSE 90 03/04/2019   CHOL 211 (H) 03/04/2019   TRIG 79.0 03/04/2019   HDL 67.70 03/04/2019   LDLDIRECT 127.6  03/28/2013   LDLCALC 128 (H) 03/04/2019   ALT 32 03/04/2019   AST 37 03/04/2019   NA 125 (L) 03/04/2019   K 5.1 03/04/2019   CL 90 (L) 03/04/2019   CREATININE 0.68 03/04/2019   BUN 8 03/04/2019   CO2 25 03/04/2019   TSH 0.75 04/07/2019   INR 0.99 06/13/2015   HGBA1C 5.2 06/14/2015   MICROALBUR 1.0 05/29/2014    MM 3D SCREEN BREAST W/IMPLANT BILATERAL  Result Date: 08/04/2019 CLINICAL DATA:  Screening. EXAM: DIGITAL SCREENING BILATERAL MAMMOGRAM WITH IMPLANTS, CAD AND TOMO The patient has retropectoral implants. Standard and implant displaced views were performed. COMPARISON:  Previous exam(s). ACR Breast Density Category c: The breast tissue is heterogeneously dense, which may obscure small masses. FINDINGS: There are no findings suspicious for malignancy. Images were processed with CAD. IMPRESSION: No mammographic evidence of malignancy. A result letter of this screening mammogram will be mailed directly to the patient. RECOMMENDATION: Screening mammogram in one year. (Code:SM-B-01Y) BI-RADS CATEGORY  1:  Negative. Electronically Signed   By: Lajean Manes M.D.   On: 08/04/2019 11:38     Assessment & Plan:  Plan  I have changed [name]'s lisinopril. I am also having her maintain her Fish Oil, CoQ10, multivitamin, Cyanocobalamin (VITAMIN B-12 PO), alendronate, and metoprolol succinate.  Meds ordered this encounter  Medications  . alendronate (FOSAMAX) 70 MG tablet    Sig: Take with a full glass of water on an empty stomach.    Dispense:  12 tablet    Refill:  3  . lisinopril (ZESTRIL) 40 MG tablet    Sig: Take 1 tablet (40 mg total) by mouth daily.    Dispense:  90 tablet    Refill:  1  . metoprolol succinate (TOPROL-XL) 100 MG 24 hr tablet    Sig: Take 1 tablet (100 mg total) by mouth daily. Take with or immediately following a meal.    Dispense:  90 tablet    Refill:  3    Problem List Items Addressed This Visit      Unprioritized   Ataxia - Primary    Refer  to neuro  ? parkinsons  Recheck MRi       Relevant Orders   Ambulatory referral to Neurology   MR Brain Wo Contrast   Ambulatory referral to [place] hypertension    Slightly elevated today but pt is anxious today She did not want to be here  con't meds      Relevant Medications   lisinopril (ZESTRIL) 40 MG tablet   metoprolol succinate (TOPROL-XL) 100 MG 24 hr tablet   Other Relevant Orders   Lipid panel   Comprehensive metabolic panel   H/O  ETOH abuse    Pt is still drinking  She knows this is not good for her and she needs to stop       TIA (transient ischemic attack)    Mri brain ordered       Relevant Medications   lisinopril (ZESTRIL) 40 MG tablet   metoprolol succinate (TOPROL-XL) 100 MG 24 hr tablet    Other Visit Diagnoses    Need for immunization against influenza       Relevant Orders   Flu Vaccine QUAD 36+ mos IM (Completed)   Frequent falls       Relevant Orders   Ambulatory referral to Neurology   MR Brain Wo Contrast   Ambulatory referral to [place]   Dyslipidemia       Relevant Orders   Lipid panel   Comprehensive metabolic panel   Osteopenia, unspecified location       Relevant Medications   alendronate (FOSAMAX) 70 MG tablet   Other Relevant Orders   DG Bone Density   Estrogen deficiency       Relevant Medications   alendronate (FOSAMAX) 70 MG tablet   Other Relevant Orders   DG Bone Density      Follow-up: Return in about 3 months (around 06/21/2020) for annual exam, fasting.  Ann Held, DO

## 2020-03-22 NOTE — Patient Instructions (Signed)
Ataxia  Ataxia is a condition that causes unsteadiness when walking and standing, poor coordination of body movements, and difficulty keeping a straight (upright) posture. It occurs because of a problem with the part of the brain that controls coordination and stability (cerebellum). Ataxia can develop later in life (acquired ataxia), during your 20s or 30s or even into your 60s or later. This type of ataxia develops when another medical condition, such as a stroke, damages the cerebellum. Ataxia also may be present early in life (non-acquired ataxia). There are two main types of non-acquired ataxia:  Congenital. This type is present at birth.  Hereditary. This type is passed from parent to child. The most common form of hereditary non-acquired ataxia is Friedreich ataxia. What are the causes? Acquired ataxia may be caused by:  Changes in the nervous system (neurodegenerative changes).  Changes throughout the body (systemic disorders).  A lot of exposure to: ? Certain medicines such as phenytoin and lithium. ? Solvents. These are cleaning fluids such as paint thinner, nail polish remover, carpet cleaner, and degreasers.  Alcohol abuse (alcoholism).  Medical conditions, such as: ? Celiac disease. ? Hypothyroidism. ? A lack (deficiency) of vitamin E, vitamin B12, or thiamine. ? Brain tumors. ? Multiple sclerosis. ? Cerebral palsy. ? Stroke. ? Paraneoplastic syndromes. ? Viral infections. ? Head injury. ? Malnutrition. Congenital and hereditary ataxia are caused by problems that are present in genes before birth. What are the signs or symptoms? Signs and symptoms of ataxia vary depending on the cause. They may include:  Being unsteady.  Walking with the legs wide apart (wide stance) to keep one's balance.  Uncontrolled shaking (tremor).  Poorly coordinated body movements.  Difficulty maintaining an upright posture.  Fatigue.  Changes in speech.  Changes in  vision.  Involuntary eye movements (nystagmus).  Difficulty swallowing.  Difficulty writing.  Muscle tightening that you cannot control (muscle spasms). How is this diagnosed? Ataxia may be diagnosed based on:  Your personal and family medical history.  A physical exam.  Imaging tests, such as a CT scan or MRI.  Spinal tap (lumbar puncture). This procedure involves using a needle to take a sample of the fluid around your brain and spinal cord.  Genetic testing. How is this treated? The underlying condition that causes your ataxia needs to be treated. If the cause is a brain tumor, you may need surgery. Treatment also focuses on helping you live with ataxia and improving your quality of life (supportive treatments). This may involve:  Learning ways to improve coordination and move around more carefully (physical therapy).  Learning ways to improve your ability to do daily tasks, such as bathing and feeding yourself (occupational therapy).  Using devices to help you move around, eat, or communicate (assistive devices), such as a walker, modified eating utensils, and communication aids.  Learning ways to improve speech and swallowing (speech therapy). Follow these instructions at home: Preventing falls  Lie down right away if you become very unsteady, dizzy, or nauseous, or if you feel like you are going to faint. Do not get up until all of those feelings pass.  Keep your home well-lit. Use night-lights as needed.  Remove tripping hazards, such as rugs, cords, and clutter.  Install grab bars by the toilet and in the tub and shower.  Use assistive devices such as a cane, walker, or wheelchair as needed to keep your balance. General instructions  Do not drink alcohol.  Ask your health care provider what activities are safe  for you, and what activities you should avoid.  Take over-the-counter and prescription medicines only as told by your health care provider. Get help  right away if you:  Have unsteadiness that suddenly worsens.  Have any of these: ? Severe headaches. ? Chest pain. ? Abdominal pain. ? Weakness or numbness on one side of your body. ? Vision problems. ? Difficulty speaking. ? An irregular heartbeat. ? A very fast pulse.  Feel confused. Summary  Ataxia is a condition that causes unsteadiness when walking and standing, poor coordination of body movements, and difficulty keeping a straight (upright) posture.  Ataxia occurs because of a problem with the part of the brain that controls coordination and stability (cerebellum).  The underlying condition that causes your ataxia needs to be treated. Treatment also focuses on helping you live with ataxia and improving your quality of life (supportive treatments).  Lie down right away if you become very unsteady, dizzy, or nauseous, or if you feel like you are going to faint. This information is not intended to replace advice given to you by your health care provider. Make sure you discuss any questions you have with your health care provider. Document Revised: 06/08/2017 Document Reviewed: 04/27/2017 Elsevier Patient Education  [place].

## 2020-03-22 NOTE — Assessment & Plan Note (Signed)
Mri brain ordered

## 2020-03-22 NOTE — Assessment & Plan Note (Signed)
Pt is still drinking  She knows this is not good for her and she needs to stop

## 2020-03-23 LAB — LIPID PANEL
Cholesterol: 261 mg/dL — ABNORMAL HIGH (ref <200)
HDL: 124 mg/dL (ref >=50)
LDL Cholesterol (Calc): 122 mg/dL (calc) — ABNORMAL HIGH
Non-HDL Cholesterol (Calc): 137 mg/dL (calc) — ABNORMAL HIGH (ref <130)
Total CHOL/HDL Ratio: 2.1 (calc) (ref <5.0)
Triglycerides: 60 mg/dL (ref <150)

## 2020-03-23 LAB — COMPREHENSIVE METABOLIC PANEL
AG Ratio: 2 (calc) (ref 1.0–2.5)
ALT: 45 U/L — ABNORMAL HIGH (ref 6–29)
AST: 58 U/L — ABNORMAL HIGH (ref 10–35)
Albumin: 4.9 g/dL (ref 3.6–5.1)
Alkaline phosphatase (APISO): 62 U/L (ref 37–153)
BUN: 7 mg/dL (ref 7–25)
CO2: 21 mmol/L (ref 20–32)
Calcium: 9.9 mg/dL (ref 8.6–10.4)
Chloride: 93 mmol/L — ABNORMAL LOW (ref 98–110)
Creat: 0.71 mg/dL (ref 0.50–0.99)
Globulin: 2.5 g/dL (calc) (ref 1.9–3.7)
Glucose, Bld: 81 mg/dL (ref 65–99)
Potassium: 5.2 mmol/L (ref 3.5–5.3)
Sodium: 130 mmol/L — ABNORMAL LOW (ref 135–146)
Total Bilirubin: 0.6 mg/dL (ref 0.2–1.2)
Total Protein: 7.4 g/dL (ref 6.1–8.1)

## 2020-03-24 ENCOUNTER — Other Ambulatory Visit: Payer: Self-pay

## 2020-03-24 DIAGNOSIS — R748 Abnormal levels of other serum enzymes: Secondary | ICD-10-CM

## 2020-03-29 ENCOUNTER — Ambulatory Visit (HOSPITAL_COMMUNITY): Payer: BC Managed Care – PPO

## 2020-04-01 ENCOUNTER — Ambulatory Visit (HOSPITAL_COMMUNITY): Payer: BC Managed Care – PPO

## 2020-04-01 ENCOUNTER — Other Ambulatory Visit: Payer: Self-pay

## 2020-04-01 ENCOUNTER — Ambulatory Visit (HOSPITAL_COMMUNITY)
Admission: RE | Admit: 2020-04-01 | Discharge: 2020-04-01 | Disposition: A | Discharge status: Home / Self Care | Payer: BC Managed Care – PPO | Source: Ambulatory Visit | Attending: Family Medicine | Admitting: Family Medicine

## 2020-04-01 DIAGNOSIS — R296 Repeated falls: Secondary | ICD-10-CM | POA: Insufficient documentation

## 2020-04-01 DIAGNOSIS — R270 Ataxia, unspecified: Secondary | ICD-10-CM | POA: Diagnosis not present

## 2020-04-15 ENCOUNTER — Telehealth: Payer: Self-pay | Admitting: Family Medicine

## 2020-04-15 NOTE — Telephone Encounter (Signed)
Patient called requesting a medication be sent into her pharmacy b/c her arthritis is flaring up, patient states she is stiff.  Please Advise

## 2020-04-15 NOTE — Telephone Encounter (Signed)
Her liver enzymes are elevated---- -we need the [place] to check her liver  Pain meds will make that worse--- she can take aleve or use voltaren get

## 2020-04-19 NOTE — Telephone Encounter (Signed)
Left message on machine to call back  

## 2020-04-21 NOTE — Telephone Encounter (Signed)
Caller: Lessie Dings  Call Back # [phone]   Patient representative called today with a few questions regarding a referral for this pt, as well as a couple of other questions and is requesting a call back from clinical staff if possible.    Thank you

## 2020-04-23 ENCOUNTER — Ambulatory Visit (HOSPITAL_BASED_OUTPATIENT_CLINIC_OR_DEPARTMENT_OTHER)
Admission: RE | Admit: 2020-04-23 | Discharge: 2020-04-23 | Disposition: A | Discharge status: Home / Self Care | Payer: BC Managed Care – PPO | Source: Ambulatory Visit | Attending: Family Medicine | Admitting: Family Medicine

## 2020-04-23 ENCOUNTER — Other Ambulatory Visit (HOSPITAL_BASED_OUTPATIENT_CLINIC_OR_DEPARTMENT_OTHER): Payer: BC Managed Care – PPO

## 2020-04-23 ENCOUNTER — Telehealth: Payer: Self-pay | Admitting: Family Medicine

## 2020-04-23 ENCOUNTER — Other Ambulatory Visit: Payer: Self-pay

## 2020-04-23 DIAGNOSIS — R748 Abnormal levels of other serum enzymes: Secondary | ICD-10-CM | POA: Insufficient documentation

## 2020-04-23 DIAGNOSIS — E2839 Other primary ovarian failure: Secondary | ICD-10-CM | POA: Insufficient documentation

## 2020-04-23 DIAGNOSIS — M8580 Other specified disorders of bone density and structure, unspecified site: Secondary | ICD-10-CM | POA: Diagnosis not present

## 2020-04-23 DIAGNOSIS — M85852 Other specified disorders of bone density and structure, left thigh: Secondary | ICD-10-CM | POA: Diagnosis not present

## 2020-04-23 DIAGNOSIS — K760 Fatty (change of) liver, not elsewhere classified: Secondary | ICD-10-CM | POA: Diagnosis not present

## 2020-04-23 DIAGNOSIS — Z780 Asymptomatic menopausal state: Secondary | ICD-10-CM | POA: Diagnosis not present

## 2020-04-23 NOTE — Telephone Encounter (Signed)
Caller name: Tye [place] Call back [phone]  A referral was sent to [facility] for PT however, they don't have a physical therapist in [facility].   They also have some question in regards to her sodium and chloride.

## 2020-04-26 ENCOUNTER — Other Ambulatory Visit: Payer: Self-pay | Admitting: Family Medicine

## 2020-04-26 DIAGNOSIS — K7030 Alcoholic cirrhosis of liver without ascites: Secondary | ICD-10-CM

## 2020-04-28 NOTE — Telephone Encounter (Signed)
Spoke with [employer]. [name] is the patient's friend and on the [place]. She states that [facility] stated that they wouldn't have PT therapist for a couple of weeks and states the patient is willing to pay for care.

## 2020-04-28 NOTE — Telephone Encounter (Signed)
Attempted to call. Barnetta Chapel not home

## 2020-04-30 ENCOUNTER — Other Ambulatory Visit: Payer: Self-pay

## 2020-04-30 DIAGNOSIS — R296 Repeated falls: Secondary | ICD-10-CM

## 2020-04-30 DIAGNOSIS — R270 Ataxia, unspecified: Secondary | ICD-10-CM

## 2020-05-01 DIAGNOSIS — H524 Presbyopia: Secondary | ICD-10-CM | POA: Diagnosis not present

## 2020-05-01 DIAGNOSIS — R270 Ataxia, unspecified: Secondary | ICD-10-CM | POA: Diagnosis not present

## 2020-05-01 DIAGNOSIS — K760 Fatty (change of) liver, not elsewhere classified: Secondary | ICD-10-CM | POA: Diagnosis not present

## 2020-05-01 DIAGNOSIS — M47816 Spondylosis without myelopathy or radiculopathy, lumbar region: Secondary | ICD-10-CM | POA: Diagnosis not present

## 2020-05-01 DIAGNOSIS — E785 Hyperlipidemia, unspecified: Secondary | ICD-10-CM | POA: Diagnosis not present

## 2020-05-01 DIAGNOSIS — F419 Anxiety disorder, unspecified: Secondary | ICD-10-CM | POA: Diagnosis not present

## 2020-05-01 DIAGNOSIS — H9190 Unspecified hearing loss, unspecified ear: Secondary | ICD-10-CM | POA: Diagnosis not present

## 2020-05-01 DIAGNOSIS — Z85828 Personal history of other malignant neoplasm of skin: Secondary | ICD-10-CM | POA: Diagnosis not present

## 2020-05-01 DIAGNOSIS — H5210 Myopia, unspecified eye: Secondary | ICD-10-CM | POA: Diagnosis not present

## 2020-05-01 DIAGNOSIS — M8580 Other specified disorders of bone density and structure, unspecified site: Secondary | ICD-10-CM | POA: Diagnosis not present

## 2020-05-01 DIAGNOSIS — F32A Depression, unspecified: Secondary | ICD-10-CM | POA: Diagnosis not present

## 2020-05-01 DIAGNOSIS — I10 Essential (primary) hypertension: Secondary | ICD-10-CM | POA: Diagnosis not present

## 2020-05-01 DIAGNOSIS — Z8673 Personal history of transient ischemic attack (TIA), and cerebral infarction without residual deficits: Secondary | ICD-10-CM | POA: Diagnosis not present

## 2020-05-01 DIAGNOSIS — F10139 Alcohol abuse with withdrawal, unspecified: Secondary | ICD-10-CM | POA: Diagnosis not present

## 2020-05-01 DIAGNOSIS — R32 Unspecified urinary incontinence: Secondary | ICD-10-CM | POA: Diagnosis not present

## 2020-05-03 ENCOUNTER — Ambulatory Visit: Payer: BC Managed Care – PPO | Admitting: Family Medicine

## 2020-05-05 DIAGNOSIS — F32A Depression, unspecified: Secondary | ICD-10-CM | POA: Diagnosis not present

## 2020-05-05 DIAGNOSIS — H524 Presbyopia: Secondary | ICD-10-CM | POA: Diagnosis not present

## 2020-05-05 DIAGNOSIS — H9190 Unspecified hearing loss, unspecified ear: Secondary | ICD-10-CM | POA: Diagnosis not present

## 2020-05-05 DIAGNOSIS — R32 Unspecified urinary incontinence: Secondary | ICD-10-CM | POA: Diagnosis not present

## 2020-05-05 DIAGNOSIS — H5210 Myopia, unspecified eye: Secondary | ICD-10-CM | POA: Diagnosis not present

## 2020-05-05 DIAGNOSIS — F10139 Alcohol abuse with withdrawal, unspecified: Secondary | ICD-10-CM | POA: Diagnosis not present

## 2020-05-05 DIAGNOSIS — K760 Fatty (change of) liver, not elsewhere classified: Secondary | ICD-10-CM | POA: Diagnosis not present

## 2020-05-05 DIAGNOSIS — I10 Essential (primary) hypertension: Secondary | ICD-10-CM | POA: Diagnosis not present

## 2020-05-05 DIAGNOSIS — R270 Ataxia, unspecified: Secondary | ICD-10-CM | POA: Diagnosis not present

## 2020-05-05 DIAGNOSIS — Z8673 Personal history of transient ischemic attack (TIA), and cerebral infarction without residual deficits: Secondary | ICD-10-CM | POA: Diagnosis not present

## 2020-05-05 DIAGNOSIS — Z85828 Personal history of other malignant neoplasm of skin: Secondary | ICD-10-CM | POA: Diagnosis not present

## 2020-05-05 DIAGNOSIS — M8580 Other specified disorders of bone density and structure, unspecified site: Secondary | ICD-10-CM | POA: Diagnosis not present

## 2020-05-05 DIAGNOSIS — F419 Anxiety disorder, unspecified: Secondary | ICD-10-CM | POA: Diagnosis not present

## 2020-05-05 DIAGNOSIS — E785 Hyperlipidemia, unspecified: Secondary | ICD-10-CM | POA: Diagnosis not present

## 2020-05-05 DIAGNOSIS — M47816 Spondylosis without myelopathy or radiculopathy, lumbar region: Secondary | ICD-10-CM | POA: Diagnosis not present

## 2020-05-14 ENCOUNTER — Telehealth: Payer: Self-pay | Admitting: Family Medicine

## 2020-05-14 DIAGNOSIS — E785 Hyperlipidemia, unspecified: Secondary | ICD-10-CM | POA: Diagnosis not present

## 2020-05-14 DIAGNOSIS — F10139 Alcohol abuse with withdrawal, unspecified: Secondary | ICD-10-CM | POA: Diagnosis not present

## 2020-05-14 DIAGNOSIS — H9190 Unspecified hearing loss, unspecified ear: Secondary | ICD-10-CM | POA: Diagnosis not present

## 2020-05-14 DIAGNOSIS — I10 Essential (primary) hypertension: Secondary | ICD-10-CM | POA: Diagnosis not present

## 2020-05-14 DIAGNOSIS — H5210 Myopia, unspecified eye: Secondary | ICD-10-CM | POA: Diagnosis not present

## 2020-05-14 DIAGNOSIS — R32 Unspecified urinary incontinence: Secondary | ICD-10-CM | POA: Diagnosis not present

## 2020-05-14 DIAGNOSIS — M8580 Other specified disorders of bone density and structure, unspecified site: Secondary | ICD-10-CM | POA: Diagnosis not present

## 2020-05-14 DIAGNOSIS — M47816 Spondylosis without myelopathy or radiculopathy, lumbar region: Secondary | ICD-10-CM | POA: Diagnosis not present

## 2020-05-14 DIAGNOSIS — H524 Presbyopia: Secondary | ICD-10-CM | POA: Diagnosis not present

## 2020-05-14 DIAGNOSIS — Z85828 Personal history of other malignant neoplasm of skin: Secondary | ICD-10-CM | POA: Diagnosis not present

## 2020-05-14 DIAGNOSIS — K760 Fatty (change of) liver, not elsewhere classified: Secondary | ICD-10-CM | POA: Diagnosis not present

## 2020-05-14 DIAGNOSIS — F419 Anxiety disorder, unspecified: Secondary | ICD-10-CM | POA: Diagnosis not present

## 2020-05-14 DIAGNOSIS — F32A Depression, unspecified: Secondary | ICD-10-CM | POA: Diagnosis not present

## 2020-05-14 DIAGNOSIS — Z8673 Personal history of transient ischemic attack (TIA), and cerebral infarction without residual deficits: Secondary | ICD-10-CM | POA: Diagnosis not present

## 2020-05-14 DIAGNOSIS — G459 Transient cerebral ischemic attack, unspecified: Secondary | ICD-10-CM

## 2020-05-14 DIAGNOSIS — R270 Ataxia, unspecified: Secondary | ICD-10-CM | POA: Diagnosis not present

## 2020-05-14 NOTE — Telephone Encounter (Signed)
Orders faxed

## 2020-05-14 NOTE — Telephone Encounter (Signed)
Please advise 

## 2020-05-14 NOTE — Telephone Encounter (Signed)
Ok to send rx

## 2020-05-14 NOTE — Telephone Encounter (Signed)
Caller name: Sue Lush [facility]) Call back number:[phone] Fax: [phone]  Need written rx for 4 wheel walker with seat  Please fax rx

## 2020-05-15 DIAGNOSIS — F32A Depression, unspecified: Secondary | ICD-10-CM | POA: Diagnosis not present

## 2020-05-15 DIAGNOSIS — Z8673 Personal history of transient ischemic attack (TIA), and cerebral infarction without residual deficits: Secondary | ICD-10-CM | POA: Diagnosis not present

## 2020-05-15 DIAGNOSIS — M8580 Other specified disorders of bone density and structure, unspecified site: Secondary | ICD-10-CM | POA: Diagnosis not present

## 2020-05-15 DIAGNOSIS — H9190 Unspecified hearing loss, unspecified ear: Secondary | ICD-10-CM | POA: Diagnosis not present

## 2020-05-15 DIAGNOSIS — I10 Essential (primary) hypertension: Secondary | ICD-10-CM | POA: Diagnosis not present

## 2020-05-15 DIAGNOSIS — E785 Hyperlipidemia, unspecified: Secondary | ICD-10-CM | POA: Diagnosis not present

## 2020-05-15 DIAGNOSIS — K760 Fatty (change of) liver, not elsewhere classified: Secondary | ICD-10-CM | POA: Diagnosis not present

## 2020-05-15 DIAGNOSIS — F419 Anxiety disorder, unspecified: Secondary | ICD-10-CM | POA: Diagnosis not present

## 2020-05-15 DIAGNOSIS — H524 Presbyopia: Secondary | ICD-10-CM | POA: Diagnosis not present

## 2020-05-15 DIAGNOSIS — F10139 Alcohol abuse with withdrawal, unspecified: Secondary | ICD-10-CM | POA: Diagnosis not present

## 2020-05-15 DIAGNOSIS — Z85828 Personal history of other malignant neoplasm of skin: Secondary | ICD-10-CM | POA: Diagnosis not present

## 2020-05-15 DIAGNOSIS — M47816 Spondylosis without myelopathy or radiculopathy, lumbar region: Secondary | ICD-10-CM | POA: Diagnosis not present

## 2020-05-15 DIAGNOSIS — R270 Ataxia, unspecified: Secondary | ICD-10-CM | POA: Diagnosis not present

## 2020-05-15 DIAGNOSIS — H5210 Myopia, unspecified eye: Secondary | ICD-10-CM | POA: Diagnosis not present

## 2020-05-15 DIAGNOSIS — R32 Unspecified urinary incontinence: Secondary | ICD-10-CM | POA: Diagnosis not present

## 2020-05-18 DIAGNOSIS — K760 Fatty (change of) liver, not elsewhere classified: Secondary | ICD-10-CM | POA: Diagnosis not present

## 2020-05-18 DIAGNOSIS — Z8673 Personal history of transient ischemic attack (TIA), and cerebral infarction without residual deficits: Secondary | ICD-10-CM | POA: Diagnosis not present

## 2020-05-18 DIAGNOSIS — H9190 Unspecified hearing loss, unspecified ear: Secondary | ICD-10-CM | POA: Diagnosis not present

## 2020-05-18 DIAGNOSIS — M8580 Other specified disorders of bone density and structure, unspecified site: Secondary | ICD-10-CM | POA: Diagnosis not present

## 2020-05-18 DIAGNOSIS — F32A Depression, unspecified: Secondary | ICD-10-CM | POA: Diagnosis not present

## 2020-05-18 DIAGNOSIS — H5210 Myopia, unspecified eye: Secondary | ICD-10-CM | POA: Diagnosis not present

## 2020-05-18 DIAGNOSIS — E785 Hyperlipidemia, unspecified: Secondary | ICD-10-CM | POA: Diagnosis not present

## 2020-05-18 DIAGNOSIS — R32 Unspecified urinary incontinence: Secondary | ICD-10-CM | POA: Diagnosis not present

## 2020-05-18 DIAGNOSIS — M47816 Spondylosis without myelopathy or radiculopathy, lumbar region: Secondary | ICD-10-CM | POA: Diagnosis not present

## 2020-05-18 DIAGNOSIS — I10 Essential (primary) hypertension: Secondary | ICD-10-CM | POA: Diagnosis not present

## 2020-05-18 DIAGNOSIS — H524 Presbyopia: Secondary | ICD-10-CM | POA: Diagnosis not present

## 2020-05-18 DIAGNOSIS — F10139 Alcohol abuse with withdrawal, unspecified: Secondary | ICD-10-CM | POA: Diagnosis not present

## 2020-05-18 DIAGNOSIS — Z85828 Personal history of other malignant neoplasm of skin: Secondary | ICD-10-CM | POA: Diagnosis not present

## 2020-05-18 DIAGNOSIS — R270 Ataxia, unspecified: Secondary | ICD-10-CM | POA: Diagnosis not present

## 2020-05-18 DIAGNOSIS — F419 Anxiety disorder, unspecified: Secondary | ICD-10-CM | POA: Diagnosis not present

## 2020-05-19 ENCOUNTER — Other Ambulatory Visit (INDEPENDENT_AMBULATORY_CARE_PROVIDER_SITE_OTHER): Payer: BC Managed Care – PPO

## 2020-05-19 ENCOUNTER — Encounter: Payer: Self-pay | Admitting: Physician Assistant

## 2020-05-19 ENCOUNTER — Ambulatory Visit: Payer: BC Managed Care – PPO | Admitting: Physician Assistant

## 2020-05-19 VITALS — BP 134/88 | HR 98 | Ht 61.0 in | Wt 112.0 lb

## 2020-05-19 DIAGNOSIS — K7460 Unspecified cirrhosis of liver: Secondary | ICD-10-CM

## 2020-05-19 DIAGNOSIS — F1010 Alcohol abuse, uncomplicated: Secondary | ICD-10-CM

## 2020-05-19 DIAGNOSIS — R270 Ataxia, unspecified: Secondary | ICD-10-CM | POA: Diagnosis not present

## 2020-05-19 LAB — CBC WITH DIFFERENTIAL/PLATELET
Basophils Absolute: 0.1 10*3/uL (ref 0.0–0.1)
Basophils Relative: 1.5 % (ref 0.0–3.0)
Eosinophils Absolute: 0 10*3/uL (ref 0.0–0.7)
Eosinophils Relative: 0.8 % (ref 0.0–5.0)
HCT: 37.5 % (ref 36.0–46.0)
Hemoglobin: 13.2 g/dL (ref 12.0–15.0)
Lymphocytes Relative: 22.5 % (ref 12.0–46.0)
Lymphs Abs: 1.3 10*3/uL (ref 0.7–4.0)
MCHC: 35.2 g/dL (ref 30.0–36.0)
MCV: 107.6 fl — ABNORMAL HIGH (ref 78.0–100.0)
Monocytes Absolute: 0.6 10*3/uL (ref 0.1–1.0)
Monocytes Relative: 10.5 % (ref 3.0–12.0)
Neutro Abs: 3.8 10*3/uL (ref 1.4–7.7)
Neutrophils Relative %: 64.7 % (ref 43.0–77.0)
Platelets: 277 10*3/uL (ref 150.0–400.0)
RBC: 3.48 Mil/uL — ABNORMAL LOW (ref 3.87–5.11)
RDW: 12.4 % (ref 11.5–15.5)
WBC: 5.8 10*3/uL (ref 4.0–10.5)

## 2020-05-19 LAB — PROTIME-INR
INR: 0.9 ratio (ref 0.8–1.0)
Prothrombin Time: 10.2 s (ref 9.6–13.1)

## 2020-05-19 LAB — AMMONIA: Ammonia: 13 umol/L (ref 11–35)

## 2020-05-19 LAB — B12 AND FOLATE PANEL
Folate: 8.5 ng/mL (ref >5.9)
Vitamin B-12: 444 pg/mL (ref 211–911)

## 2020-05-19 NOTE — Progress Notes (Signed)
Subjective:    Patient ID: [name], female    DOB: 06-24-1956, 64 y.o.   MRN: [id number]  HPI [name] is a 64 year old white female, known previously to Dr. Ardis Hughs from colonoscopy done in 2019 who is referred today by Dr. Etter Sjogren- Cheri Rous for evaluation of new diagnosis of EtOH induced cirrhosis. Patient also has history of hypertension, prior TIA, and she and her friend today relate that she has been having significant difficulty with her balance over the past couple of months and has had several falls.  She is also just started having episodes of urinary incontinence. She had undergone baseline labs in September 2021 with c-Met showing sodium 130, albumin 4.1/AST 58 and ALT 45, no CBC was done. Subsequent upper abdominal ultrasound 04/23/2020 with finding of a cirrhotic appearing liver, portal vein patent and no evidence of splenomegaly.  Patient does admit to drinking on a daily basis over the past 25 years.  She says she usually just drinks beer, 4 to 6/day and occasional wine.  She is distressed by the fact that she has been found to have cirrhosis and is not sure why this would not have shown up on her laboratory studies in the past.  She is continuing to drink currently. Appetite has been poor recently but she has also been stressed and has also recently lost her father. No nausea or vomiting, no complaints of abdominal pain, no extremity edema or increasing abdominal girth.  No melena or hematochezia. Patient currently lives alone is undergoing physical therapy and her good friend who accompanies her today is trying to help her out though she has significant generalized weakness and apparently a very unsteady gait.  She has been using a walker at home. Review of Systems Pertinent positive and negative review of systems were noted in the above HPI section.  All other review of systems was otherwise negative.  Outpatient Encounter Medications as of 05/19/2020  Medication Sig  .  alendronate (FOSAMAX) 70 MG tablet Take with a full glass of water on an empty stomach.  . Coenzyme Q10 (COQ10) 100 MG CAPS Take 1 tablet by mouth daily.   . Cyanocobalamin (VITAMIN B-12 PO) Take 1 tablet by mouth daily.  Marland Kitchen lisinopril (ZESTRIL) 40 MG tablet Take 1 tablet (40 mg total) by mouth daily.  . metoprolol succinate (TOPROL-XL) 100 MG 24 hr tablet Take 1 tablet (100 mg total) by mouth daily. Take with or immediately following a meal.  . Multiple Vitamin (MULTIVITAMIN) capsule Take by mouth.  . Omega-3 Fatty Acids (FISH OIL) 1200 MG CAPS Take 1,200 mg by mouth daily.    No facility-administered encounter medications on file as of 05/19/2020.   Allergies  Allergen Reactions  . Codeine Other (See Comments)    REACTION: insomnia and anxious   Patient Active Problem List   Diagnosis Date Noted  . 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 Alcohol withdrawal ([place]) 10/09/2015  . Nausea, vomiting and diarrhea 10/07/2015  . Diarrhea 10/07/2015  . Anxiety   . Acute ischemic stroke ([place])   . Left leg weakness 06/13/2015  . TIA (transient ischemic attack) 06/13/2015  . Hyponatremia 06/13/2015  . 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 H/O corneal ulcer 09/24/2012  . Bronchitis 06/14/2012  . Error, refractive, myopia 02/29/2012  . Presbyopia 01/25/2012  . H/O ETOH abuse 09/13/2011  . DYSURIA 10/22/2009  . PROTEINURIA, MILD 04/21/2009  . ABNORMAL FINDINGS GI TRACT 12/03/2007  . Essential hypertension 10/22/2007   Social History   Socioeconomic History  .  Marital status: Divorced    Spouse name: Not on file  . Number of children: Not on file  . Years of education: Not on file  . Highest education level: Not on file  Occupational History  . 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 Smoking status: Never Smoker  . Smokeless tobacco: Never Used  Vaping Use  . Vaping Use: Never used  Substance and Sexual Activity  . Alcohol use: Yes    Alcohol/week: 0.0 standard drinks    Comment: 3 times a  week, states hx of heavy use 15 years ago  . Drug use: No  . Sexual activity: Not Currently    Partners: Male  Other Topics Concern  . Not on file  Social History Narrative   Exercise--- gym 6x a week   Social Determinants of Health   Financial Resource Strain:   . Difficulty of Paying Living Expenses: Not on file  Food Insecurity:   . Worried About Charity fundraiser in the Last Year: Not on file  . Ran Out of Food in the Last Year: Not on file  Transportation Needs:   . Lack of Transportation (Medical): Not on file  . Lack of Transportation (Non-Medical): Not on file  Physical Activity:   . Days of Exercise per Week: Not on file  . Minutes of Exercise per Session: Not on file  Stress:   . Feeling of Stress : Not on file  Social Connections:   . Frequency of Communication with Friends and Family: Not on file  . Frequency of Social Gatherings with Friends and Family: Not on file  . Attends Religious Services: Not on file  . Active Member of Clubs or Organizations: Not on file  . Attends Archivist Meetings: Not on file  . Marital Status: Not on file  Intimate Partner Violence:   . Fear of Current or Ex-Partner: Not on file  . Emotionally Abused: Not on file  . Physically Abused: Not on file  . Sexually Abused: Not on file    Ms. Corrales's family history includes Alzheimer's disease in her mother; Hyperlipidemia in her father; Hypertension in her mother; Kidney disease in her mother; Rheum arthritis in her paternal grandmother; Stroke in her paternal grandmother.      Objective:    Vitals:   05/19/20 1328  BP: 134/88  Pulse: 98    Physical Exam Well-developed somewhat chronically ill-appearing thin older white female in a wheelchair, accompanied by her friend .  Height, Weight 112, BMI 21.1  HEENT; nontraumatic normocephalic, EOMI, PE RR LA, sclera anicteric. Oropharynx; not examined Neck; supple, no JVD Cardiovascular; regular rate and rhythm with  S1-S2, no murmur rub or gallop Pulmonary; Clear bilaterally Abdomen; soft, nontender, nondistended, no palpable mass or hepatosplenomegaly, bowel sounds are active Rectal; not done today Skin; benign exam, no jaundice rash or appreciable lesions Extremities; no clubbing cyanosis or edema skin warm and dry Neuro/Psych; alert and oriented x4, , no asterixis, detailed neuro exam not done did not observe patient ambulating       Assessment & Plan:   #62 64 year old white female with new diagnosis of alcohol induced cirrhosis, noted on ultrasound.  Recent labs with mild transaminitis. She has not had CBC or INR so cannot calculate M ELD.  Patient with continued EtOH use/abuse over the past 25 years and is still drinking currently though trying to decrease amount.  #2 colon cancer screening-up-to-date with negative colonoscopy October 2019 with  exception of diverticulosis 3.  Ataxia-significant issues over the past few months with balance, multiple falls, weakness and now with urinary incontinence.  Neuro consult pending Consider hepatic encephalopathy, Warnicke's vs other  Plan; CBC with differential, pro time/INR, AFP, hepatitis B and C serologies, B12 and folate level and ammonia Patient strongly encouraged to discontinue all alcohol use permanently. Her friend is concerned she may have withdrawal symptoms.  Suggested she decrease beer intake to half of her usual over the next 1 to 2 weeks then completely discontinue We also discussed AA,, patient really should not be driving at this point, her friend is willing to transport her, suggested they wait until after neuro evaluation.  Patient is not certain that she wants to join AA. Start daily multivitamin We will also start thiamine 300 mg p.o. daily Patient will be scheduled for upper endoscopy with Dr. Ardis Hughs to screen for esophageal varices.  This will be done in January.  Procedure was discussed in detail with patient including indications  risk and benefits and she is agreeable to proceed. Await neuro evaluation. Further recommendations pending results of labs.  She will be seen in follow-up by Dr. Ardis Hughs at the time of EGD, can see sooner as needed.  Shanikqua Zarzycki S Kamirah Shugrue PA-C 05/19/2020   Cc: Ann Held, *

## 2020-05-19 NOTE — Patient Instructions (Signed)
If you are age 63 or older, your body mass index should be between 23-30. Your Body mass index is 21.16 kg/m. If this is out of the aforementioned range listed, please consider follow up with your Primary Care Provider.  If you are age 80 or younger, your body mass index should be between 19-25. Your Body mass index is 21.16 kg/m. If this is out of the aformentioned range listed, please consider follow up with your Primary Care Provider.   Your provider has requested that you go to the basement level for lab work before leaving today. Press "B" on the elevator. The lab is located at the first door on the left as you exit the elevator.  You have been scheduled for an endoscopy. Please follow written instructions given to you at your visit today. If you use inhalers (even only as needed), please bring them with you on the day of your procedure.  START a Multivitamin daily START Thiamine (B1) 250 mg daily  STOP use of Alcohol- decrease over the next 2-3 weeks until you stop drinking.  Follow up pending at this time.

## 2020-05-20 LAB — AFP TUMOR MARKER: AFP-Tumor Marker: 6.2 ng/mL — ABNORMAL HIGH

## 2020-05-20 LAB — HEPATITIS B SURFACE ANTIGEN: Hepatitis B Surface Ag: NONREACTIVE

## 2020-05-20 LAB — HEPATITIS B SURFACE ANTIBODY,QUALITATIVE: Hep B S Ab: REACTIVE — AB

## 2020-05-20 LAB — HEPATITIS C ANTIBODY
Hepatitis C Ab: NONREACTIVE
SIGNAL TO CUT-OFF: 0 (ref <1.00)

## 2020-05-20 NOTE — Progress Notes (Signed)
I agree with the above note, plan 

## 2020-05-24 DIAGNOSIS — M47816 Spondylosis without myelopathy or radiculopathy, lumbar region: Secondary | ICD-10-CM | POA: Diagnosis not present

## 2020-05-24 DIAGNOSIS — E785 Hyperlipidemia, unspecified: Secondary | ICD-10-CM | POA: Diagnosis not present

## 2020-05-24 DIAGNOSIS — R270 Ataxia, unspecified: Secondary | ICD-10-CM | POA: Diagnosis not present

## 2020-05-24 DIAGNOSIS — H524 Presbyopia: Secondary | ICD-10-CM | POA: Diagnosis not present

## 2020-05-24 DIAGNOSIS — H9190 Unspecified hearing loss, unspecified ear: Secondary | ICD-10-CM | POA: Diagnosis not present

## 2020-05-24 DIAGNOSIS — K760 Fatty (change of) liver, not elsewhere classified: Secondary | ICD-10-CM | POA: Diagnosis not present

## 2020-05-24 DIAGNOSIS — F419 Anxiety disorder, unspecified: Secondary | ICD-10-CM | POA: Diagnosis not present

## 2020-05-24 DIAGNOSIS — Z8673 Personal history of transient ischemic attack (TIA), and cerebral infarction without residual deficits: Secondary | ICD-10-CM | POA: Diagnosis not present

## 2020-05-24 DIAGNOSIS — F32A Depression, unspecified: Secondary | ICD-10-CM | POA: Diagnosis not present

## 2020-05-24 DIAGNOSIS — M8580 Other specified disorders of bone density and structure, unspecified site: Secondary | ICD-10-CM | POA: Diagnosis not present

## 2020-05-24 DIAGNOSIS — H5210 Myopia, unspecified eye: Secondary | ICD-10-CM | POA: Diagnosis not present

## 2020-05-24 DIAGNOSIS — F10139 Alcohol abuse with withdrawal, unspecified: Secondary | ICD-10-CM | POA: Diagnosis not present

## 2020-05-24 DIAGNOSIS — I10 Essential (primary) hypertension: Secondary | ICD-10-CM | POA: Diagnosis not present

## 2020-05-24 DIAGNOSIS — R32 Unspecified urinary incontinence: Secondary | ICD-10-CM | POA: Diagnosis not present

## 2020-05-24 DIAGNOSIS — Z85828 Personal history of other malignant neoplasm of skin: Secondary | ICD-10-CM | POA: Diagnosis not present

## 2020-05-24 NOTE — Telephone Encounter (Signed)
Samyra called to follow up on this request.

## 2020-05-26 ENCOUNTER — Ambulatory Visit: Payer: BC Managed Care – PPO | Admitting: Neurology

## 2020-05-26 ENCOUNTER — Other Ambulatory Visit: Payer: Self-pay

## 2020-05-26 ENCOUNTER — Encounter: Payer: Self-pay | Admitting: Neurology

## 2020-05-26 VITALS — BP 141/89 | HR 72

## 2020-05-26 DIAGNOSIS — R2689 Other abnormalities of gait and mobility: Secondary | ICD-10-CM

## 2020-05-26 NOTE — Patient Instructions (Addendum)
I believe you have a multifactorial gait disorder, meaning, that it is [name] is due to a combination of factors. These factors include: chronic brain atrophy or volume loss, ongoing daily alcohol consumption, prior falls with fear of falling, possible neuropathy which means nerve damage, degenerative arthritis of your back, suboptimal water intake or hydration, and deconditioning.  Please remember to stand up slowly and get your bearings first turn slowly, no bending down to pick anything, no heavy lifting, be extra careful at night and first thing in the morning. Also, be careful in the Bathroom and the kitchen.  Please use your walker at all times.  Please continue with home health physical therapy.  As far as your medications are concerned, I would like to suggest no new medications.   As far as diagnostic testing: We can pursue electrical nerve and muscle testing called EMG and nerve conduction velocity testing.  You recently had a brain MRI ordered by her primary care physician.  Your brain MRI did not show any acute changes.  You have chronic changes.  For urinary incontinence, I would recommend evaluation with a urologist.  Please talk to Dr. Etter Sjogren about a referral.  Also, optimizing your hearing would help with your balance I believe.  Please have your hearing aids rechecked, you may be eligible for new ones.  Please look into getting a call alert button, like [employer] Alert.  I think you would benefit from more help and supervision at the house.  Please talk to your family about the possibility of getting help such as a hired caretaker or the possibility of moving in with somebody or with somebody moving in with you.  Please call us back if you are agreeable to pursuing electrical nerve and muscle testing.   I will see you back in this clinic as needed.

## 2020-05-26 NOTE — Progress Notes (Signed)
Subjective:    Patient ID: [name] is a 64 y.o. female.  HPI     Star Age, MD, PhD [facility] [address]O. [place], [place] [address]   Dear Dr. Etter Sjogren,   I saw your patient, [name], upon your kind request in my neurologic clinic today for initial consultation of her gait disorder.  Patient is accompanied by her friend Tye Maryland today.  As you know, [name] is a 64 year old right-handed woman with an underlying medical history of alcohol dependence (also chart review reveals polysubstance abuse history the patient denies any other substance use), osteopenia, hypertension, hearing loss, depression, anxiety, and squamous cell cancer, who reports chronic and progressive difficulty with her gait and balance and recurrent falls.  The patient is very hard of hearing, she forgot to bring her hearing aids.  Her friend provides some most of the history and reports that the patient has had progressive problems with her balance.  She fell about 3 weeks ago, this was in her bedroom.  She lives alone, she could not get up for about 3 hours. Tye Maryland reports that this is what the patient told her.  She does not have any family close by, she has a son who lives in [place] and a daughter in [place].  Patient is divorced.  She is fearful of falling.  She is in a wheelchair today, did not bring her walker.  She is waiting to get a new walker as I understand.  She has had physical therapy at the house which has been helpful, she has been advised to use her walker at all times per friend. She does not always hydrate well with water.  She continues to drink alcohol daily but has reduced.  She has been advised to reduce her alcohol consumption by her GI specialist as well.  She has an appointment pending for follow-up.  She has reduced her beer intake from 4 beers per day to 2-3 beers per day, she does not consume any liquor.  She has reduced her wine  intake from 2 glasses/day to 1 glass/day.  She is thirsty all the time.  She has not experienced any focal weakness or sudden onset of one-sided weakness or numbness or tingling or droopy face or slurring of speech.  She denies numbness or tingling in her feet.  She has seen rheumatology.  Her friend lives about 45 minutes away.  Her bedroom and her shower is upstairs in her home.  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  Her friend reports that she also complains of her hearing aids being too large and uncomfortable.  She has had loss of appetite and has lost weight.  She had recent abnormal findings on her liver ultrasound and is suspected to have liver cirrhosis.  I reviewed the abdominal ultrasound report from 04/23/2020: IMPRESSION: 1. Fatty liver with sonographic findings of cirrhosis. 2. Patent main portal vein with hepatopetal flow. 3. Dilatation of the common bile duct. Further evaluation with MRI/MRCP recommended.  She had a brain MRI without contrast on 04/01/2020 and I reviewed the results:  IMPRESSION: 1. No acute intracranial abnormality. 2. Moderate supratentorial and infratentorial parenchymal volume loss, significantly more pronounced than expected for age and slightly progressed since prior MRI. 3. Mild chronic white matter disease, predominantly in posterior periventricular location, nonspecific, slightly progressed from prior MRI.  She had a brain MRI and MRA head without contrast on 06/13/2015 and  I reviewed the results: IMPRESSION: Atrophy and mild chronic microvascular ischemia  Negative for acute infarct  Negative MRA of the head.  She had seen Dr. Carles Collet at [facility] neurology on 06/28/2015 and I reviewed the note.   She had recent blood work on 05/19/2020 and I reviewed the results: B12 was 444, folate 8.5, hepatitis C antibody nonreactive, hepatitis B surface antigen nonreactive, hepatitis B surface antibody reactive, AFP tumor marker 6.2,  ammonia 13, CBC with differential showed MCV elevated at 107.6, INR 0.9.  CMP showed sodium mildly low at 130, chloride 93, AST 58, ALT 45.  Her Past Medical History Is Significant For: Past Medical History:  Diagnosis Date  . Anxiety   . Cancer ([place]) 04/2014   squamous cell carcinoma L inner thigh  . Depression   . Hard of hearing   . Hypertension   . Osteopenia   . Substance abuse ([place])   . TIA (transient ischemic attack) 2016    Her Past Surgical History Is Significant For: Past Surgical History:  Procedure Laterality Date  . AUGMENTATION MAMMAPLASTY Bilateral 1988  . BREAST ENHANCEMENT SURGERY    . BUNIONECTOMY    . GUM SURGERY  11/2017  . NOSE SURGERY     Epistaxis  . TONSILLECTOMY      Her Family History Is Significant For: Family History  Problem Relation Age of Onset  . Hypertension Mother   . Kidney disease Mother        dialysis  . Alzheimer's disease Mother   . Hyperlipidemia Father   . Stroke Paternal Grandmother   . Rheum arthritis Paternal Grandmother   . Breast cancer Neg Hx   . Colon cancer Neg Hx   . Esophageal cancer Neg Hx   . Rectal cancer Neg Hx   . Stomach cancer Neg Hx     Her Social History Is Significant For: Social History   Socioeconomic History  . Marital status: Divorced    Spouse name: Not on file  . Number of children: Not on file  . Years of education: Not on file  . Highest education level: Not on file  Occupational History  . 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 Smoking status: Never Smoker  . Smokeless tobacco: Never Used  Vaping Use  . Vaping Use: Never used  Substance and Sexual Activity  . Alcohol use: Yes    Alcohol/week: 0.0 standard drinks    Comment: 3 times a week, states hx of heavy use 15 years ago  . Drug use: No  . Sexual activity: Not Currently    Partners: Male  Other Topics Concern  . Not on file  Social History Narrative   Exercise--- gym 6x a week   Social Determinants of  Health   Financial Resource Strain:   . Difficulty of Paying Living Expenses: Not on file  Food Insecurity:   . Worried About Charity fundraiser in the Last Year: Not on file  . Ran Out of Food in the Last Year: Not on file  Transportation Needs:   . Lack of Transportation (Medical): Not on file  . Lack of Transportation (Non-Medical): Not on file  Physical Activity:   . Days of Exercise per Week: Not on file  . Minutes of Exercise per Session: Not on file  Stress:   . Feeling of Stress : Not on file  Social Connections:   . Frequency of Communication with Friends and Family: Not on file  .  Frequency of Social Gatherings with Friends and Family: Not on file  . Attends Religious Services: Not on file  . Active Member of Clubs or Organizations: Not on file  . Attends Archivist Meetings: Not on file  . Marital Status: Not on file    Her Allergies Are:  Allergies  Allergen Reactions  . Codeine Other (See Comments)    REACTION: insomnia and anxious  :   Her Current Medications Are:  Outpatient Encounter Medications as of 05/26/2020  Medication Sig  . alendronate (FOSAMAX) 70 MG tablet Take with a full glass of water on an empty stomach.  . Coenzyme Q10 (COQ10) 100 MG CAPS Take 1 tablet by mouth daily.   . Cyanocobalamin (VITAMIN B-12 PO) Take 1 tablet by mouth daily.  Marland Kitchen lisinopril (ZESTRIL) 40 MG tablet Take 1 tablet (40 mg total) by mouth daily.  . metoprolol succinate (TOPROL-XL) 100 MG 24 hr tablet Take 1 tablet (100 mg total) by mouth daily. Take with or immediately following a meal.  . Multiple Vitamin (MULTIVITAMIN) capsule Take by mouth.  . thiamine 250 MG tablet Take 250 mg by mouth daily.  . [DISCONTINUED] Omega-3 Fatty Acids (FISH OIL) 1200 MG CAPS Take 1,200 mg by mouth daily.    No facility-administered encounter medications on file as of 05/26/2020.  :   Review of Systems:  Out of a complete 14 point review of systems, all are reviewed and negative  with the exception of these symptoms as listed below:  Review of Systems  Neurological:       Pt presents today to discuss her gait, urinary incontinence, and falls. Pt reports that her falls and gait have improved with home PT.    Objective:  Neurological Exam  Physical Exam Physical Examination:   Vitals:   05/26/20 1004  BP: (!) 141/89  Pulse: 72    General Examination: The patient is a very pleasant 64 y.o. female in no acute distress. She appears ill and deconditioned.  She is mildly tremulous, mildly anxious appearing.  Face is slightly flushed appearing.  HEENT: Normocephalic, atraumatic, pupils are equal, round and reactive to light and accommodation. Funduscopic exam is normal with sharp disc margins noted. Extraocular tracking is good without limitation to gaze excursion or nystagmus noted. Normal smooth pursuit is noted. Hearing is quite impaired.  She does not have any hearing aids in place.  She forgot her hearing aids.  Face is symmetric with normal facial animation and normal facial sensation, no carotid bruits.  No dysarthria.  Airway examination reveals adequate dental hygiene, tongue protrudes centrally and palate elevates symmetrically.  She has no orofacial dyskinesias, no lip, neck or jaw tremor per se.  Globally is slightly tremulous appearing.    Chest: Clear to auscultation without wheezing, rhonchi or crackles noted.  Heart: S1+S2+0, regular and normal without murmurs, rubs or gallops noted.   Abdomen: Soft, non-tender and non-distended with normal bowel sounds appreciated on auscultation.  Extremities: There is no pitting edema in the distal lower extremities bilaterally. Pedal pulses are intact.  Skin: Warm and dry without trophic changes noted.  Musculoskeletal: exam reveals no obvious joint deformities, tenderness or joint swelling or erythema.   Neurologically:  Mental status: The patient is awake, alert and oriented in all 4 spheres. Her immediate  and remote memory, attention, language skills and fund of knowledge are appropriate. There is no evidence of aphasia, agnosia, apraxia or anomia. Speech is clear with normal prosody and enunciation. Thought process is  linear. Mood is normal and affect is blunted.  Cranial nerves II - XII are as described above under HEENT exam. In addition: shoulder shrug is normal with equal shoulder height noted. Motor exam: Thin bulk, global strength of 4+ out of 5.  She has no resting tremor, she has no drift.  [place]berg is not tested due to safety concerns.  She did not bring her walker as well.  She is very hard of hearing and memory examination is difficult reliably.  Reflexes are 1+ in the upper extremities and trace in the knees, absent in the ankles, toes are downgoing bilaterally.  Sensory exam is intact to light touch, temperature and vibration in the upper extremities but some decrease in sensation on testing of the distal lower extremities but patient is also quite hard of hearing.  Testing accuracy is difficult to gauge. Cerebellar testing: No dysmetria or intention tremor on finger to nose testing.   Gait, station and balance: She is in a wheelchair and did not bring her walker.  I asked her to stand up briefly, she needed assistance, she stands wide-based.  I did not ask her to walk for me due to safety concerns.   Assessment and Plan:  In summary, [name] is a very pleasant 64 y.o.-year old female with an underlying medical history of alcohol dependence (also chart review reveals polysubstance abuse history the patient denies any other substance use), osteopenia, hypertension, hearing loss, depression, anxiety, and squamous cell cancer, who presents for evaluation of her gait disorder and falls.  Unfortunately, she did not bring her walker today.  She has had physical therapy at home.  Examination is nonspecific for any underlying unifying neurological disorder.  She has had progressive gait  difficulty, she has ongoing alcohol use disorder.  She is cutting back on her alcohol consumption.  It appears that she has developed liver cirrhosis.  She is followed by GI.  She has had recent elevation in her liver enzymes.  She had appetite and weight loss.  Deconditioning is at play probably as well.  She may have some degree of neuropathy, possibly secondary to chronic alcohol use.  I offered additional testing today which she declines.  Her friend says that the patient is ready to leave the appointment as soon as possible.  She was advised to consider additional blood work, we could check CK level and proceed with EMG nerve conduction velocity testing.  She had a recent brain MRI which showed significant atrophy.  Unfortunately, there is not a whole lot I can offer her.  She had her TSH checked last year, I would recommend with her next blood work to check CK and TSH levels through your office.  She is advised to follow-up with your office as scheduled and I would be happy to see her back as needed.  She is strongly reminded to use her walker at all times.  She is not able to even stand up on her own.  She is advised to stay well-hydrated and follow-up with her liver specialist as scheduled.  Her gait disorder is most likely secondary to a combination of factors including chronic use of alcohol with possibility of neuropathy, brain atrophy, degenerative arthritis of the back, suboptimal water intake or suboptimal hydration and overall muscular deconditioning.  I do believe improving her hearing would help.  She is advised to get her hearing rechecked, she reports discomfort with her hearing aids.  In addition, she reports urinary incontinence.  She is  advised to consider seeing a urologist.  She is encouraged to talk to you about this.  In addition, she may need more help at the house and supervision.  She is advised to talk to her family about this, such as an aide at the house or moving in with somebody or  having somebody move in with her.  In addition, she is encouraged to look into getting a call alert button such as life alert.  I answered all the questions today and the patient and her friend were in agreement, she is going to follow-up with you. Thank you very much for allowing me to participate in the care of this nice patient. If I can be of any further assistance to you please do not hesitate to call me at [phone].  Sincerely,   Star Age, MD, PhD

## 2020-06-10 DIAGNOSIS — K760 Fatty (change of) liver, not elsewhere classified: Secondary | ICD-10-CM | POA: Diagnosis not present

## 2020-06-10 DIAGNOSIS — Z85828 Personal history of other malignant neoplasm of skin: Secondary | ICD-10-CM | POA: Diagnosis not present

## 2020-06-10 DIAGNOSIS — E785 Hyperlipidemia, unspecified: Secondary | ICD-10-CM | POA: Diagnosis not present

## 2020-06-10 DIAGNOSIS — I10 Essential (primary) hypertension: Secondary | ICD-10-CM | POA: Diagnosis not present

## 2020-06-10 DIAGNOSIS — F419 Anxiety disorder, unspecified: Secondary | ICD-10-CM | POA: Diagnosis not present

## 2020-06-10 DIAGNOSIS — F32A Depression, unspecified: Secondary | ICD-10-CM | POA: Diagnosis not present

## 2020-06-10 DIAGNOSIS — H9190 Unspecified hearing loss, unspecified ear: Secondary | ICD-10-CM | POA: Diagnosis not present

## 2020-06-10 DIAGNOSIS — R32 Unspecified urinary incontinence: Secondary | ICD-10-CM | POA: Diagnosis not present

## 2020-06-10 DIAGNOSIS — R270 Ataxia, unspecified: Secondary | ICD-10-CM | POA: Diagnosis not present

## 2020-06-10 DIAGNOSIS — M47816 Spondylosis without myelopathy or radiculopathy, lumbar region: Secondary | ICD-10-CM | POA: Diagnosis not present

## 2020-06-10 DIAGNOSIS — M8580 Other specified disorders of bone density and structure, unspecified site: Secondary | ICD-10-CM | POA: Diagnosis not present

## 2020-06-10 DIAGNOSIS — Z8673 Personal history of transient ischemic attack (TIA), and cerebral infarction without residual deficits: Secondary | ICD-10-CM | POA: Diagnosis not present

## 2020-06-10 DIAGNOSIS — H5210 Myopia, unspecified eye: Secondary | ICD-10-CM | POA: Diagnosis not present

## 2020-06-10 DIAGNOSIS — F10139 Alcohol abuse with withdrawal, unspecified: Secondary | ICD-10-CM | POA: Diagnosis not present

## 2020-06-10 DIAGNOSIS — H524 Presbyopia: Secondary | ICD-10-CM | POA: Diagnosis not present

## 2020-06-10 NOTE — Telephone Encounter (Addendum)
Sue Lush called to update fax number # [phone]  Please resend fax with order for scooter attached .

## 2020-06-11 NOTE — Telephone Encounter (Signed)
Order resent

## 2020-06-15 DIAGNOSIS — G459 Transient cerebral ischemic attack, unspecified: Secondary | ICD-10-CM | POA: Diagnosis not present

## 2020-06-24 DIAGNOSIS — M8580 Other specified disorders of bone density and structure, unspecified site: Secondary | ICD-10-CM | POA: Diagnosis not present

## 2020-06-24 DIAGNOSIS — F32A Depression, unspecified: Secondary | ICD-10-CM | POA: Diagnosis not present

## 2020-06-24 DIAGNOSIS — E785 Hyperlipidemia, unspecified: Secondary | ICD-10-CM | POA: Diagnosis not present

## 2020-06-24 DIAGNOSIS — K760 Fatty (change of) liver, not elsewhere classified: Secondary | ICD-10-CM | POA: Diagnosis not present

## 2020-06-24 DIAGNOSIS — F419 Anxiety disorder, unspecified: Secondary | ICD-10-CM | POA: Diagnosis not present

## 2020-06-24 DIAGNOSIS — H524 Presbyopia: Secondary | ICD-10-CM | POA: Diagnosis not present

## 2020-06-24 DIAGNOSIS — Z8673 Personal history of transient ischemic attack (TIA), and cerebral infarction without residual deficits: Secondary | ICD-10-CM | POA: Diagnosis not present

## 2020-06-24 DIAGNOSIS — Z85828 Personal history of other malignant neoplasm of skin: Secondary | ICD-10-CM | POA: Diagnosis not present

## 2020-06-24 DIAGNOSIS — F10139 Alcohol abuse with withdrawal, unspecified: Secondary | ICD-10-CM | POA: Diagnosis not present

## 2020-06-24 DIAGNOSIS — R32 Unspecified urinary incontinence: Secondary | ICD-10-CM | POA: Diagnosis not present

## 2020-06-24 DIAGNOSIS — H9190 Unspecified hearing loss, unspecified ear: Secondary | ICD-10-CM | POA: Diagnosis not present

## 2020-06-24 DIAGNOSIS — R270 Ataxia, unspecified: Secondary | ICD-10-CM | POA: Diagnosis not present

## 2020-06-24 DIAGNOSIS — I10 Essential (primary) hypertension: Secondary | ICD-10-CM | POA: Diagnosis not present

## 2020-06-24 DIAGNOSIS — M47816 Spondylosis without myelopathy or radiculopathy, lumbar region: Secondary | ICD-10-CM | POA: Diagnosis not present

## 2020-06-24 DIAGNOSIS — H5210 Myopia, unspecified eye: Secondary | ICD-10-CM | POA: Diagnosis not present

## 2020-06-28 ENCOUNTER — Ambulatory Visit (INDEPENDENT_AMBULATORY_CARE_PROVIDER_SITE_OTHER): Payer: BC Managed Care – PPO | Admitting: Family Medicine

## 2020-06-28 ENCOUNTER — Other Ambulatory Visit (HOSPITAL_BASED_OUTPATIENT_CLINIC_OR_DEPARTMENT_OTHER): Payer: Self-pay | Admitting: Internal Medicine

## 2020-06-28 ENCOUNTER — Other Ambulatory Visit: Payer: Self-pay

## 2020-06-28 ENCOUNTER — Ambulatory Visit: Payer: BC Managed Care – PPO | Attending: Internal Medicine

## 2020-06-28 ENCOUNTER — Encounter: Payer: Self-pay | Admitting: Family Medicine

## 2020-06-28 VITALS — BP 124/80 | HR 67 | Temp 98.8°F | Resp 18 | Ht 61.0 in

## 2020-06-28 DIAGNOSIS — I10 Essential (primary) hypertension: Secondary | ICD-10-CM

## 2020-06-28 DIAGNOSIS — Z0000 Encounter for general adult medical examination without abnormal findings: Secondary | ICD-10-CM

## 2020-06-28 DIAGNOSIS — K7030 Alcoholic cirrhosis of liver without ascites: Secondary | ICD-10-CM | POA: Diagnosis not present

## 2020-06-28 DIAGNOSIS — F1011 Alcohol abuse, in remission: Secondary | ICD-10-CM

## 2020-06-28 DIAGNOSIS — R32 Unspecified urinary incontinence: Secondary | ICD-10-CM

## 2020-06-28 DIAGNOSIS — Z23 Encounter for immunization: Secondary | ICD-10-CM

## 2020-06-28 LAB — POC URINALSYSI DIPSTICK (AUTOMATED)
Bilirubin, UA: NEGATIVE
Blood, UA: NEGATIVE
Glucose, UA: NEGATIVE
Ketones, UA: NEGATIVE
Leukocytes, UA: NEGATIVE
Nitrite, UA: NEGATIVE
Protein, UA: NEGATIVE
Spec Grav, UA: 1.015 (ref 1.010–1.025)
Urobilinogen, UA: 0.2 E.U./dL
pH, UA: 6 (ref 5.0–8.0)

## 2020-06-28 MED ORDER — MIRABEGRON ER 50 MG PO TB24
50.0000 mg | ORAL_TABLET | Freq: Every day | ORAL | 3 refills | Status: DC
Start: 2020-06-28 — End: 2021-04-08

## 2020-06-28 NOTE — Assessment & Plan Note (Signed)
Well controlled, no changes to meds. Encouraged heart healthy diet such as the DASH diet and exercise as tolerated.  °

## 2020-06-28 NOTE — Assessment & Plan Note (Signed)
Pt having egd next month  Pt encouraged to stop drinking and will seek help --- her friend will make sure she makes an appointment

## 2020-06-28 NOTE — Progress Notes (Signed)
Subjective:     [name] is a 64 y.o. female and is here for a comprehensive physical exam. The patient reports problems - frequent falls, cirrhosis due to alcohol .  Her friend is with her today--- pt is in a wheel chair due to balance issues -- she uses a walker at home   Social History   Socioeconomic History  . Marital status: Divorced    Spouse name: Not on file  . Number of children: Not on file  . Years of education: Not on file  . Highest education level: Not on file  Occupational History  . 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 Smoking status: Never Smoker  . Smokeless tobacco: Never Used  Vaping Use  . Vaping Use: Never used  Substance and Sexual Activity  . Alcohol use: Yes    Alcohol/week: 0.0 standard drinks    Comment: 3 times a week, states hx of heavy use 15 years ago  . Drug use: No  . Sexual activity: Not Currently    Partners: Male  Other Topics Concern  . Not on file  Social History Narrative   Exercise--- gym 6x a week   Social Determinants of Health   Financial Resource Strain: Not on file  Food Insecurity: Not on file  Transportation Needs: Not on file  Physical Activity: Not on file  Stress: Not on file  Social Connections: Not on file  Intimate Partner Violence: Not on file   Health Maintenance  Topic Date Due  . PAP SMEAR-Modifier  04/30/2020  . MAMMOGRAM  07/31/2021  . DEXA SCAN  04/23/2022  . COLONOSCOPY  04/26/2028  . TETANUS/TDAP  03/17/2029  . INFLUENZA VACCINE  Completed  . COVID-19 Vaccine  Completed  . Hepatitis C Screening  Completed  . HIV Screening  Completed    The following portions of the patient's history were reviewed and updated as appropriate:  She  has a past medical history of Anxiety, Cancer ([place]) (04/2014), Depression, Hard of hearing, Hypertension, Osteopenia, Substance abuse ([place]), and TIA (transient ischemic attack) (2016). She does not have any pertinent problems on file. She  has a  past surgical history that includes Nose surgery; Breast enhancement surgery; Bunionectomy; Tonsillectomy; Augmentation mammaplasty (Bilateral, 1988); and Gum surgery (11/2017). Her family history includes Alzheimer's disease in her mother; Hyperlipidemia in her father; Hypertension in her mother; Kidney disease in her mother; Rheum arthritis in her paternal grandmother; Stroke in her paternal grandmother. She  reports that she has never smoked. She has never used smokeless tobacco. She reports current alcohol use. She reports that she does not use drugs. She has a current medication list which includes the following prescription(s): alendronate, coq10, cyanocobalamin, lisinopril, metoprolol succinate, multivitamin, thiamine, and mirabegron er. Current Outpatient Medications on File Prior to Visit  Medication Sig Dispense Refill  . alendronate (FOSAMAX) 70 MG tablet Take with a full glass of water on an empty stomach. 12 tablet 3  . Coenzyme Q10 (COQ10) 100 MG CAPS Take 1 tablet by mouth daily.     . Cyanocobalamin (VITAMIN B-12 PO) Take 1 tablet by mouth daily.    Marland Kitchen lisinopril (ZESTRIL) 40 MG tablet Take 1 tablet (40 mg total) by mouth daily. 90 tablet 1  . metoprolol succinate (TOPROL-XL) 100 MG 24 hr tablet Take 1 tablet (100 mg total) by mouth daily. Take with or immediately following a meal. 90 tablet 3  . Multiple Vitamin (MULTIVITAMIN) capsule Take by mouth.    Marland Kitchen  thiamine 250 MG tablet Take 250 mg by mouth daily.     No current facility-administered medications on file prior to visit.   She is allergic to codeine..  Review of Systems .Review of Systems  Constitutional: Positive for malaise/fatigue and weight loss.  HENT: Positive for hearing loss. Negative for ear pain.   Eyes: Negative.   Respiratory: Negative.   Cardiovascular: Negative.   Gastrointestinal: Negative.   Genitourinary: Positive for frequency and urgency. Negative for dysuria, flank pain and hematuria.   Musculoskeletal: Negative.   Skin: Negative for itching and rash.  Neurological: Positive for weakness. Negative for tremors, seizures and loss of consciousness.     Objective:    BP 124/80 (BP Location: Left Arm, Patient Position: Sitting, Cuff Size: Normal)   Pulse 67   Temp 98.8 F (37.1 C) (Oral)   Resp 18   Ht 5\' 1"  (1.549 m)   SpO2 98%   BMI 21.16 kg/m  General appearance: alert, cooperative, appears stated age and no distress Head: Normocephalic, without obvious abnormality, atraumatic Eyes: negative findings: lids and lashes normal and pupils equal, round, reactive to light and accomodation Ears: normal TM's and external ear canals both ears Throat: lips, mucosa, and tongue normal; teeth and gums normal Neck: no adenopathy, no carotid bruit, no JVD, supple, symmetrical, trachea midline and thyroid not enlarged, symmetric, no tenderness/mass/nodules Back: negative Lungs: clear to auscultation bilaterally Breasts: deferred -- pt in wheelchair Heart: regular rate and rhythm, S1, S2 normal, no murmur, click, rub or gallop Abdomen: soft, non-tender; bowel sounds normal; no masses,  no organomegaly Extremities: extremities normal, atraumatic, no cyanosis or edema Pulses: 2+ and symmetric Skin: Skin color, texture, turgor normal. No rashes or lesions Lymph nodes: Cervical, supraclavicular, and axillary nodes normal. Neurologic: Gait: wide based gait , unsteady     Assessment:    Healthy female exam.      Plan:    ghm --  utd Check labs  See After Visit Summary for Counseling Recommendations    1. Alcoholic cirrhosis, unspecified whether ascites present ([place]) Advised pt to seek counseling / psych for treatment  Pt given list and her friend states her and the other friends will make sure she gets to any appointments made and they will seek tx  - Lipid panel - TSH - CBC with Differential/Platelet - Comprehensive metabolic panel - CK (Creatine Kinase) - Ambulatory  referral to [facility]  2. Primary hypertension Well controlled, no changes to meds. Encouraged heart healthy diet such as the DASH diet and exercise as tolerated.  - Lipid panel - TSH - CBC with Differential/Platelet - Comprehensive metabolic panel - CK (Creatine Kinase)  3. Preventative health care ghm utd Check labs   - Lipid panel - TSH - CBC with Differential/Platelet - Comprehensive metabolic panel - CK (Creatine Kinase)  4. Urinary incontinence, unspecified type rx sent  Neuro advised pt see urology - Ambulatory referral to Urology - Ambulatory referral to [facility] - mirabegron ER (MYRBETRIQ) 50 MG TB24 tablet; Take 1 tablet (50 mg total) by mouth daily.  Dispense: 30 tablet; Refill: 3 - POCT Urinalysis Dipstick (Automated)  5. H/O ETOH abuse Pt to seek tx Friends will make sure she goes  List of names and numbers given to pt and her friend  79. Alcoholic cirrhosis of liver without ascites ([facility]) Pt having egd next month with GI Pt cutting down on alcohol   7. Essential hypertension Well controlled, no changes to meds. Encouraged heart healthy diet  such as the DASH diet and exercise as tolerated.

## 2020-06-28 NOTE — Assessment & Plan Note (Signed)
Pt working on cutting down --- she has 1 beer a day but is still drinking wine Pt advised to seek counseling for substance abuse --- her friend who is with her today will make sure she makes an appointment Pt does not want to go to [facility] and refuses to do that

## 2020-06-28 NOTE — Progress Notes (Signed)
° °  Covid-19 Vaccination Clinic  Name:  [name]    MRN: [id number] DOB: Dec 06, 1955  06/28/2020  [name] was observed post Covid-19 immunization for 15 minutes without incident. She was provided with Vaccine Information Sheet and instruction to access the V-Safe system.   [name] was instructed to call 911 with any severe reactions post vaccine:  Difficulty breathing   Swelling of face and throat   A fast heartbeat   A bad rash all over body   Dizziness and weakness   Immunizations Administered    Name Date Dose VIS Date Route   Pfizer COVID-19 Vaccine 06/28/2020 12:37 PM 0.3 mL 04/28/2020 Intramuscular   Manufacturer: [place]   Lot: [id number]   [place]: [id number]

## 2020-06-28 NOTE — Patient Instructions (Signed)
Preventive Care 40-64 Years Old, Female °Preventive care refers to visits with your health care provider and lifestyle choices that can promote health and wellness. This includes: °· A yearly physical exam. This may also be called an annual well check. °· Regular dental visits and eye exams. °· Immunizations. °· Screening for certain conditions. °· Healthy lifestyle choices, such as eating a healthy diet, getting regular exercise, not using drugs or products that contain nicotine and tobacco, and limiting alcohol use. °What can I expect for my preventive care visit? °Physical exam °Your health care provider will check your: °· Height and weight. This may be used to calculate body mass index (BMI), which tells if you are at a healthy weight. °· Heart rate and blood pressure. °· Skin for abnormal spots. °Counseling °Your health care provider may ask you questions about your: °· Alcohol, tobacco, and drug use. °· Emotional well-being. °· Home and relationship well-being. °· Sexual activity. °· Eating habits. °· Work and work environment. °· Method of birth control. °· Menstrual cycle. °· Pregnancy history. °What immunizations do I need? ° °Influenza (flu) vaccine °· This is recommended every year. °Tetanus, diphtheria, and pertussis (Tdap) vaccine °· You may need a Td booster every 10 years. °Varicella (chickenpox) vaccine °· You may need this if you have not been vaccinated. °Zoster (shingles) vaccine °· You may need this after age 60. °Measles, mumps, and rubella (MMR) vaccine °· You may need at least one dose of MMR if you were born in 1957 or later. You may also need a second dose. °Pneumococcal conjugate (PCV13) vaccine °· You may need this if you have certain conditions and were not previously vaccinated. °Pneumococcal polysaccharide (PPSV23) vaccine °· You may need one or two doses if you smoke cigarettes or if you have certain conditions. °Meningococcal conjugate (MenACWY) vaccine °· You may need this if you  have certain conditions. °Hepatitis A vaccine °· You may need this if you have certain conditions or if you travel or work in places where you may be exposed to hepatitis A. °Hepatitis B vaccine °· You may need this if you have certain conditions or if you travel or work in places where you may be exposed to hepatitis B. °Haemophilus influenzae type b (Hib) vaccine °· You may need this if you have certain conditions. °Human papillomavirus (HPV) vaccine °· If recommended by your health care provider, you may need three doses over 6 months. °You may receive vaccines as individual doses or as more than one vaccine together in one shot (combination vaccines). Talk with your health care provider about the risks and benefits of combination vaccines. °What tests do I need? °Blood tests °· Lipid and cholesterol levels. These may be checked every 5 years, or more frequently if you are over 50 years old. °· Hepatitis C test. °· Hepatitis B test. °Screening °· Lung cancer screening. You may have this screening every year starting at age 55 if you have a 30-pack-year history of smoking and currently smoke or have quit within the past 15 years. °· Colorectal cancer screening. All adults should have this screening starting at age 50 and continuing until age 75. Your health care provider may recommend screening at age 45 if you are at increased risk. You will have tests every 1-10 years, depending on your results and the type of screening test. °· Diabetes screening. This is done by checking your blood sugar (glucose) after you have not eaten for a while (fasting). You may have this   done every 1-3 years.  Mammogram. This may be done every 1-2 years. Talk with your health care provider about when you should start having regular mammograms. This may depend on whether you have a family history of breast cancer.  BRCA-related cancer screening. This may be done if you have a family history of breast, ovarian, tubal, or peritoneal  cancers.  Pelvic exam and Pap test. This may be done every 3 years starting at age 76. Starting at age 89, this may be done every 5 years if you have a Pap test in combination with an HPV test. Other tests  Sexually transmitted disease (STD) testing.  Bone density scan. This is done to screen for osteoporosis. You may have this scan if you are at high risk for osteoporosis. Follow these instructions at home: Eating and drinking  Eat a diet that includes fresh fruits and vegetables, whole grains, lean protein, and low-fat dairy.  Take vitamin and mineral supplements as recommended by your health care provider.  Do not drink alcohol if: ? Your health care provider tells you not to drink. ? You are pregnant, may be pregnant, or are planning to become pregnant.  If you drink alcohol: ? Limit how much you have to 0-1 drink a day. ? Be aware of how much alcohol is in your drink. In the [place] one drink equals one 12 oz bottle of beer (355 mL), one 5 oz glass of wine (148 mL), or one 1 oz glass of hard liquor (44 mL). Lifestyle  Take daily care of your teeth and gums.  Stay active. Exercise for at least 30 minutes on 5 or more days each week.  Do not use any products that contain nicotine or tobacco, such as cigarettes, e-cigarettes, and chewing tobacco. If you need help quitting, ask your health care provider.  If you are sexually active, practice safe sex. Use a condom or other form of birth control (contraception) in order to prevent pregnancy and STIs (sexually transmitted infections).  If told by your health care provider, take low-dose aspirin daily starting at age 37. What's next?  Visit your health care provider once a year for a well check visit.  Ask your health care provider how often you should have your eyes and teeth checked.  Stay up to date on all vaccines. This information is not intended to replace advice given to you by your health care provider. Make sure you  discuss any questions you have with your health care provider. Document Revised: 03/07/2018 Document Reviewed: 03/07/2018 Elsevier Patient Education  2020 [employer].

## 2020-06-29 LAB — CBC WITH DIFFERENTIAL/PLATELET
Basophils Absolute: 0 10*3/uL (ref 0.0–0.1)
Basophils Relative: 0.4 % (ref 0.0–3.0)
Eosinophils Absolute: 0.1 10*3/uL (ref 0.0–0.7)
Eosinophils Relative: 1.3 % (ref 0.0–5.0)
HCT: 38.7 % (ref 36.0–46.0)
Hemoglobin: 13.4 g/dL (ref 12.0–15.0)
Lymphocytes Relative: 17.3 % (ref 12.0–46.0)
Lymphs Abs: 1.2 10*3/uL (ref 0.7–4.0)
MCHC: 34.7 g/dL (ref 30.0–36.0)
MCV: 108.9 fl — ABNORMAL HIGH (ref 78.0–100.0)
Monocytes Absolute: 0.7 10*3/uL (ref 0.1–1.0)
Monocytes Relative: 10.6 % (ref 3.0–12.0)
Neutro Abs: 4.8 10*3/uL (ref 1.4–7.7)
Neutrophils Relative %: 70.4 % (ref 43.0–77.0)
Platelets: 290 10*3/uL (ref 150.0–400.0)
RBC: 3.55 Mil/uL — ABNORMAL LOW (ref 3.87–5.11)
RDW: 12.7 % (ref 11.5–15.5)
WBC: 6.8 10*3/uL (ref 4.0–10.5)

## 2020-06-29 LAB — COMPREHENSIVE METABOLIC PANEL
ALT: 26 U/L (ref 0–35)
AST: 34 U/L (ref 0–37)
Albumin: 4.9 g/dL (ref 3.5–5.2)
Alkaline Phosphatase: 81 U/L (ref 39–117)
BUN: 6 mg/dL (ref 6–23)
CO2: 27 mEq/L (ref 19–32)
Calcium: 10.2 mg/dL (ref 8.4–10.5)
Chloride: 88 mEq/L — ABNORMAL LOW (ref 96–112)
Creatinine, Ser: 0.62 mg/dL (ref 0.40–1.20)
GFR: 94.73 mL/min (ref >60.00)
Glucose, Bld: 94 mg/dL (ref 70–99)
Potassium: 4.8 mEq/L (ref 3.5–5.1)
Sodium: 124 mEq/L — ABNORMAL LOW (ref 135–145)
Total Bilirubin: 0.7 mg/dL (ref 0.2–1.2)
Total Protein: 7.7 g/dL (ref 6.0–8.3)

## 2020-06-29 LAB — LIPID PANEL
Cholesterol: 254 mg/dL — ABNORMAL HIGH (ref 0–200)
HDL: 99.4 mg/dL (ref >39.00)
LDL Cholesterol: 126 mg/dL — ABNORMAL HIGH (ref 0–99)
NonHDL: 154.83
Total CHOL/HDL Ratio: 3
Triglycerides: 142 mg/dL (ref 0.0–149.0)
VLDL: 28.4 mg/dL (ref 0.0–40.0)

## 2020-06-29 LAB — TSH: TSH: 0.78 u[IU]/mL (ref 0.35–4.50)

## 2020-06-29 LAB — CK: Total CK: 83 U/L (ref 7–177)

## 2020-06-30 MED FILL — PFIZER-BIONTECH COVID-19 VA: 30 | 1 days supply | Qty: 0 | Fill #0

## 2020-07-19 DIAGNOSIS — Z9181 History of falling: Secondary | ICD-10-CM | POA: Diagnosis not present

## 2020-07-19 DIAGNOSIS — F419 Anxiety disorder, unspecified: Secondary | ICD-10-CM | POA: Diagnosis not present

## 2020-07-19 DIAGNOSIS — H9190 Unspecified hearing loss, unspecified ear: Secondary | ICD-10-CM | POA: Diagnosis not present

## 2020-07-19 DIAGNOSIS — Z8673 Personal history of transient ischemic attack (TIA), and cerebral infarction without residual deficits: Secondary | ICD-10-CM | POA: Diagnosis not present

## 2020-07-19 DIAGNOSIS — R32 Unspecified urinary incontinence: Secondary | ICD-10-CM | POA: Diagnosis not present

## 2020-07-19 DIAGNOSIS — I10 Essential (primary) hypertension: Secondary | ICD-10-CM | POA: Diagnosis not present

## 2020-07-19 DIAGNOSIS — M8580 Other specified disorders of bone density and structure, unspecified site: Secondary | ICD-10-CM | POA: Diagnosis not present

## 2020-07-19 DIAGNOSIS — K7030 Alcoholic cirrhosis of liver without ascites: Secondary | ICD-10-CM | POA: Diagnosis not present

## 2020-07-19 DIAGNOSIS — F32A Depression, unspecified: Secondary | ICD-10-CM | POA: Diagnosis not present

## 2020-07-21 ENCOUNTER — Telehealth: Payer: Self-pay | Admitting: Physician Assistant

## 2020-07-21 ENCOUNTER — Other Ambulatory Visit: Payer: Self-pay | Admitting: Family Medicine

## 2020-07-21 DIAGNOSIS — I10 Essential (primary) hypertension: Secondary | ICD-10-CM

## 2020-07-21 NOTE — Telephone Encounter (Signed)
Probably safe to proceed. I'll forward to Osvaldo Angst, CRNA for his opinion  Thanks

## 2020-07-21 NOTE — Telephone Encounter (Signed)
Inbound call from patient's friend stating patient fell and currently has 2 temporary crowns on her upper front teeth.  Wants to know if it still ok for patient to have EGD on 07/28/20.  Please advise.

## 2020-07-22 DIAGNOSIS — F419 Anxiety disorder, unspecified: Secondary | ICD-10-CM | POA: Diagnosis not present

## 2020-07-22 DIAGNOSIS — F32A Depression, unspecified: Secondary | ICD-10-CM | POA: Diagnosis not present

## 2020-07-22 DIAGNOSIS — I10 Essential (primary) hypertension: Secondary | ICD-10-CM | POA: Diagnosis not present

## 2020-07-22 DIAGNOSIS — H9190 Unspecified hearing loss, unspecified ear: Secondary | ICD-10-CM | POA: Diagnosis not present

## 2020-07-22 DIAGNOSIS — M8580 Other specified disorders of bone density and structure, unspecified site: Secondary | ICD-10-CM | POA: Diagnosis not present

## 2020-07-22 DIAGNOSIS — Z8673 Personal history of transient ischemic attack (TIA), and cerebral infarction without residual deficits: Secondary | ICD-10-CM | POA: Diagnosis not present

## 2020-07-22 DIAGNOSIS — Z9181 History of falling: Secondary | ICD-10-CM | POA: Diagnosis not present

## 2020-07-22 DIAGNOSIS — K7030 Alcoholic cirrhosis of liver without ascites: Secondary | ICD-10-CM | POA: Diagnosis not present

## 2020-07-22 DIAGNOSIS — R32 Unspecified urinary incontinence: Secondary | ICD-10-CM | POA: Diagnosis not present

## 2020-07-22 NOTE — Telephone Encounter (Signed)
Dr. Ardis Hughs,  This pt is cleared for anesthetic care at [facility].  Thanks,  Osvaldo Angst

## 2020-07-22 NOTE — Telephone Encounter (Signed)
Advised of the recommendations at this point. She understands there consultation with anesthesia pending.

## 2020-07-26 ENCOUNTER — Encounter: Payer: Self-pay | Admitting: Gastroenterology

## 2020-07-28 ENCOUNTER — Ambulatory Visit (AMBULATORY_SURGERY_CENTER): Payer: BC Managed Care – PPO | Admitting: Gastroenterology

## 2020-07-28 ENCOUNTER — Other Ambulatory Visit: Payer: Self-pay

## 2020-07-28 ENCOUNTER — Encounter: Payer: Self-pay | Admitting: Gastroenterology

## 2020-07-28 VITALS — BP 148/92 | HR 61 | Temp 97.5°F | Resp 12 | Ht 61.0 in | Wt 112.0 lb

## 2020-07-28 DIAGNOSIS — K319 Disease of stomach and duodenum, unspecified: Secondary | ICD-10-CM

## 2020-07-28 DIAGNOSIS — K3189 Other diseases of stomach and duodenum: Secondary | ICD-10-CM | POA: Diagnosis not present

## 2020-07-28 DIAGNOSIS — K2970 Gastritis, unspecified, without bleeding: Secondary | ICD-10-CM | POA: Diagnosis not present

## 2020-07-28 DIAGNOSIS — K7460 Unspecified cirrhosis of liver: Secondary | ICD-10-CM

## 2020-07-28 DIAGNOSIS — K7030 Alcoholic cirrhosis of liver without ascites: Secondary | ICD-10-CM | POA: Diagnosis not present

## 2020-07-28 MED ORDER — SODIUM CHLORIDE 0.9 % IV SOLN: 500.0000 mL | Freq: Once | INTRAVENOUS | Status: DC

## 2020-07-28 NOTE — Progress Notes (Signed)
Report to PACU, RN, vss, BBS= Clear.  Pt allowed to wake up and spit bit block out herself rather than pull on it since caps were very loose.  When she did, caps came out with bite block.  Pt made aware and caps saved in pink continer

## 2020-07-28 NOTE — Patient Instructions (Signed)
HANDOUTS PROVIDED ON: GASTRITIS  The biopsies taken today have been sent for pathology.  The results can take 1-3 weeks to receive.    You may resume your previous diet and medication schedule.  Thank you for allowing [place] to care for you today!!!   YOU HAD AN ENDOSCOPIC PROCEDURE TODAY AT [place]:   Refer to the procedure report that was given to you for any specific questions about what was found during the examination.  If the procedure report does not answer your questions, please call your gastroenterologist to clarify.  If you requested that your care partner not be given the details of your procedure findings, then the procedure report has been included in a sealed envelope for you to review at your convenience later.  YOU SHOULD EXPECT: Some feelings of bloating in the abdomen. Passage of more gas than usual.  Walking can help get rid of the air that was put into your GI tract during the procedure and reduce the bloating.   Please Note:  You might notice some irritation and congestion in your nose or some drainage.  This is from the oxygen used during your procedure.  There is no need for concern and it should clear up in a day or so.  SYMPTOMS TO REPORT IMMEDIATELY:   Following upper endoscopy (EGD)  Vomiting of blood or coffee ground material  New chest pain or pain under the shoulder blades  Painful or persistently difficult swallowing  New shortness of breath  Fever of 100F or higher  Black, tarry-looking stools  For urgent or emergent issues, a gastroenterologist can be reached at any hour by calling [phone]. Do not use MyChart messaging for urgent concerns.    DIET:  We do recommend a small meal at first, but then you may proceed to your regular diet.  Drink plenty of fluids but you should avoid alcoholic beverages for 24 hours.  ACTIVITY:  You should plan to take it easy for the rest of today and you should NOT DRIVE or use heavy machinery  until tomorrow (because of the sedation medicines used during the test).    FOLLOW UP: Our staff will call the number listed on your records Friday morning between 7:15 am and 8:15 am to check on you and address any questions or concerns that you may have regarding the information given to you following your procedure. If we do not reach you, we will leave a message.  We will attempt to reach you two times.  During this call, we will ask if you have developed any symptoms of COVID 19. If you develop any symptoms (ie: fever, flu-like symptoms, shortness of breath, cough etc.) before then, please call [phone].  If you test positive for Covid 19 in the 2 weeks post procedure, please call and report this information to [place].    If any biopsies were taken you will be contacted by phone or by letter within the next 1-3 weeks.  Please call us at [phone] if you have not heard about the biopsies in 3 weeks.    SIGNATURES/CONFIDENTIALITY: You and/or your care partner have signed paperwork which will be entered into your electronic medical record.  These signatures attest to the fact that that the information above on your After Visit Summary has been reviewed and is understood.  Full responsibility of the confidentiality of this discharge information lies with you and/or your care-partner.

## 2020-07-28 NOTE — Op Note (Signed)
[place] Patient Name: [name] Procedure Date: 07/28/2020 10:14 AM MRN: [id number] Endoscopist: Milus Banister , MD Age: 65 Referring MD:  Date of Birth: October 13, 1955 Gender: Female Account #: [id number] Procedure:                Upper GI endoscopy Indications:              Cirrhosis, recently diagnosed, here to screen for                            esophageal varices Medicines:                Monitored Anesthesia Care Procedure:                Pre-Anesthesia Assessment:                           - Prior to the procedure, a History and Physical                            was performed, and patient medications and                            allergies were reviewed. The patient's tolerance of                            previous anesthesia was also reviewed. The risks                            and benefits of the procedure and the sedation                            options and risks were discussed with the patient.                            All questions were answered, and informed consent                            was obtained. Prior Anticoagulants: The patient has                            taken no previous anticoagulant or antiplatelet                            agents. ASA Grade Assessment: III - A patient with                            severe systemic disease. After reviewing the risks                            and benefits, the patient was deemed in                            satisfactory condition to undergo the procedure.  After obtaining informed consent, the endoscope was                            passed under direct vision. Throughout the                            procedure, the patient's blood pressure, pulse, and                            oxygen saturations were monitored continuously. The                            Endoscope was introduced through the mouth, and                            advanced to the second part of  duodenum. The upper                            GI endoscopy was accomplished without difficulty.                            The patient tolerated the procedure well. Scope In: Scope Out: Findings:                 Mild inflammation characterized by erythema,                            friability and granularity was found in the entire                            examined stomach. Biopsies were taken with a cold                            forceps for histology.                           The exam was otherwise without abnormality.                           No signs of portal hypertension. Complications:            No immediate complications. Estimated blood loss:                            None. Estimated Blood Loss:     Estimated blood loss: none. Impression:               - Mild, non-specific gastritis, biopsied to check                            for H. pylori.                           - The examination was otherwise normal.                           -  No signs of portal hypertension. Recommendation:           - Patient has a contact number available for                            emergencies. The signs and symptoms of potential                            delayed complications were discussed with the                            patient. Return to normal activities tomorrow.                            Written discharge instructions were provided to the                            patient.                           - Resume previous diet.                           - Continue present medications.                           - Await pathology results.                           - Follow up office visit with Dr. Ardis Hughs in next                            few weeks to discuss your liver disease further. Milus Banister, MD 07/28/2020 11:05:07 AM This report has been signed electronically.

## 2020-07-30 ENCOUNTER — Telehealth: Payer: Self-pay

## 2020-07-30 ENCOUNTER — Telehealth: Payer: Self-pay | Admitting: *Deleted

## 2020-07-30 NOTE — Telephone Encounter (Signed)
Second follow up call, no answer, LM 

## 2020-07-30 NOTE — Telephone Encounter (Signed)
Attempted f/u phone call. No answer. Left message. °

## 2020-08-02 ENCOUNTER — Encounter: Payer: Self-pay | Admitting: Gastroenterology

## 2020-08-03 DIAGNOSIS — I10 Essential (primary) hypertension: Secondary | ICD-10-CM | POA: Diagnosis not present

## 2020-08-03 DIAGNOSIS — Z8673 Personal history of transient ischemic attack (TIA), and cerebral infarction without residual deficits: Secondary | ICD-10-CM | POA: Diagnosis not present

## 2020-08-03 DIAGNOSIS — H9190 Unspecified hearing loss, unspecified ear: Secondary | ICD-10-CM | POA: Diagnosis not present

## 2020-08-03 DIAGNOSIS — F419 Anxiety disorder, unspecified: Secondary | ICD-10-CM | POA: Diagnosis not present

## 2020-08-03 DIAGNOSIS — R32 Unspecified urinary incontinence: Secondary | ICD-10-CM | POA: Diagnosis not present

## 2020-08-03 DIAGNOSIS — K7030 Alcoholic cirrhosis of liver without ascites: Secondary | ICD-10-CM | POA: Diagnosis not present

## 2020-08-03 DIAGNOSIS — F32A Depression, unspecified: Secondary | ICD-10-CM | POA: Diagnosis not present

## 2020-08-03 DIAGNOSIS — M8580 Other specified disorders of bone density and structure, unspecified site: Secondary | ICD-10-CM | POA: Diagnosis not present

## 2020-08-03 DIAGNOSIS — Z9181 History of falling: Secondary | ICD-10-CM | POA: Diagnosis not present

## 2020-08-12 DIAGNOSIS — F32A Depression, unspecified: Secondary | ICD-10-CM | POA: Diagnosis not present

## 2020-08-12 DIAGNOSIS — H9190 Unspecified hearing loss, unspecified ear: Secondary | ICD-10-CM | POA: Diagnosis not present

## 2020-08-12 DIAGNOSIS — I10 Essential (primary) hypertension: Secondary | ICD-10-CM | POA: Diagnosis not present

## 2020-08-12 DIAGNOSIS — M8580 Other specified disorders of bone density and structure, unspecified site: Secondary | ICD-10-CM | POA: Diagnosis not present

## 2020-08-12 DIAGNOSIS — Z8673 Personal history of transient ischemic attack (TIA), and cerebral infarction without residual deficits: Secondary | ICD-10-CM | POA: Diagnosis not present

## 2020-08-12 DIAGNOSIS — K7030 Alcoholic cirrhosis of liver without ascites: Secondary | ICD-10-CM | POA: Diagnosis not present

## 2020-08-12 DIAGNOSIS — F419 Anxiety disorder, unspecified: Secondary | ICD-10-CM | POA: Diagnosis not present

## 2020-08-12 DIAGNOSIS — R32 Unspecified urinary incontinence: Secondary | ICD-10-CM | POA: Diagnosis not present

## 2020-08-12 DIAGNOSIS — Z9181 History of falling: Secondary | ICD-10-CM | POA: Diagnosis not present

## 2020-08-17 DIAGNOSIS — K7030 Alcoholic cirrhosis of liver without ascites: Secondary | ICD-10-CM | POA: Diagnosis not present

## 2020-08-17 DIAGNOSIS — Z9181 History of falling: Secondary | ICD-10-CM | POA: Diagnosis not present

## 2020-08-17 DIAGNOSIS — M8580 Other specified disorders of bone density and structure, unspecified site: Secondary | ICD-10-CM | POA: Diagnosis not present

## 2020-08-17 DIAGNOSIS — Z8673 Personal history of transient ischemic attack (TIA), and cerebral infarction without residual deficits: Secondary | ICD-10-CM | POA: Diagnosis not present

## 2020-08-17 DIAGNOSIS — H9190 Unspecified hearing loss, unspecified ear: Secondary | ICD-10-CM | POA: Diagnosis not present

## 2020-08-17 DIAGNOSIS — I10 Essential (primary) hypertension: Secondary | ICD-10-CM | POA: Diagnosis not present

## 2020-08-17 DIAGNOSIS — R32 Unspecified urinary incontinence: Secondary | ICD-10-CM | POA: Diagnosis not present

## 2020-08-17 DIAGNOSIS — F32A Depression, unspecified: Secondary | ICD-10-CM | POA: Diagnosis not present

## 2020-08-17 DIAGNOSIS — F419 Anxiety disorder, unspecified: Secondary | ICD-10-CM | POA: Diagnosis not present

## 2020-08-24 DIAGNOSIS — Z9181 History of falling: Secondary | ICD-10-CM | POA: Diagnosis not present

## 2020-08-24 DIAGNOSIS — I10 Essential (primary) hypertension: Secondary | ICD-10-CM | POA: Diagnosis not present

## 2020-08-24 DIAGNOSIS — F32A Depression, unspecified: Secondary | ICD-10-CM | POA: Diagnosis not present

## 2020-08-24 DIAGNOSIS — F419 Anxiety disorder, unspecified: Secondary | ICD-10-CM | POA: Diagnosis not present

## 2020-08-24 DIAGNOSIS — H9190 Unspecified hearing loss, unspecified ear: Secondary | ICD-10-CM | POA: Diagnosis not present

## 2020-08-24 DIAGNOSIS — K7030 Alcoholic cirrhosis of liver without ascites: Secondary | ICD-10-CM | POA: Diagnosis not present

## 2020-08-24 DIAGNOSIS — R32 Unspecified urinary incontinence: Secondary | ICD-10-CM | POA: Diagnosis not present

## 2020-08-24 DIAGNOSIS — Z8673 Personal history of transient ischemic attack (TIA), and cerebral infarction without residual deficits: Secondary | ICD-10-CM | POA: Diagnosis not present

## 2020-08-24 DIAGNOSIS — M8580 Other specified disorders of bone density and structure, unspecified site: Secondary | ICD-10-CM | POA: Diagnosis not present

## 2020-08-30 DIAGNOSIS — Z9181 History of falling: Secondary | ICD-10-CM | POA: Diagnosis not present

## 2020-08-30 DIAGNOSIS — M8580 Other specified disorders of bone density and structure, unspecified site: Secondary | ICD-10-CM | POA: Diagnosis not present

## 2020-08-30 DIAGNOSIS — I10 Essential (primary) hypertension: Secondary | ICD-10-CM | POA: Diagnosis not present

## 2020-08-30 DIAGNOSIS — R32 Unspecified urinary incontinence: Secondary | ICD-10-CM | POA: Diagnosis not present

## 2020-08-30 DIAGNOSIS — F32A Depression, unspecified: Secondary | ICD-10-CM | POA: Diagnosis not present

## 2020-08-30 DIAGNOSIS — F419 Anxiety disorder, unspecified: Secondary | ICD-10-CM | POA: Diagnosis not present

## 2020-08-30 DIAGNOSIS — K7030 Alcoholic cirrhosis of liver without ascites: Secondary | ICD-10-CM | POA: Diagnosis not present

## 2020-08-30 DIAGNOSIS — Z8673 Personal history of transient ischemic attack (TIA), and cerebral infarction without residual deficits: Secondary | ICD-10-CM | POA: Diagnosis not present

## 2020-08-30 DIAGNOSIS — H9190 Unspecified hearing loss, unspecified ear: Secondary | ICD-10-CM | POA: Diagnosis not present

## 2020-09-10 DIAGNOSIS — Z9181 History of falling: Secondary | ICD-10-CM | POA: Diagnosis not present

## 2020-09-10 DIAGNOSIS — M8580 Other specified disorders of bone density and structure, unspecified site: Secondary | ICD-10-CM | POA: Diagnosis not present

## 2020-09-10 DIAGNOSIS — K7030 Alcoholic cirrhosis of liver without ascites: Secondary | ICD-10-CM | POA: Diagnosis not present

## 2020-09-10 DIAGNOSIS — I10 Essential (primary) hypertension: Secondary | ICD-10-CM | POA: Diagnosis not present

## 2020-09-10 DIAGNOSIS — H9190 Unspecified hearing loss, unspecified ear: Secondary | ICD-10-CM | POA: Diagnosis not present

## 2020-09-10 DIAGNOSIS — R32 Unspecified urinary incontinence: Secondary | ICD-10-CM | POA: Diagnosis not present

## 2020-09-10 DIAGNOSIS — F419 Anxiety disorder, unspecified: Secondary | ICD-10-CM | POA: Diagnosis not present

## 2020-09-10 DIAGNOSIS — F32A Depression, unspecified: Secondary | ICD-10-CM | POA: Diagnosis not present

## 2020-09-10 DIAGNOSIS — Z8673 Personal history of transient ischemic attack (TIA), and cerebral infarction without residual deficits: Secondary | ICD-10-CM | POA: Diagnosis not present

## 2020-09-15 ENCOUNTER — Encounter: Payer: Self-pay | Admitting: Gastroenterology

## 2020-09-15 ENCOUNTER — Ambulatory Visit: Payer: BC Managed Care – PPO | Admitting: Gastroenterology

## 2020-09-15 ENCOUNTER — Other Ambulatory Visit (INDEPENDENT_AMBULATORY_CARE_PROVIDER_SITE_OTHER): Payer: BC Managed Care – PPO

## 2020-09-15 VITALS — BP 142/80 | HR 72 | Ht 59.25 in | Wt 111.1 lb

## 2020-09-15 DIAGNOSIS — K7030 Alcoholic cirrhosis of liver without ascites: Secondary | ICD-10-CM

## 2020-09-15 DIAGNOSIS — I10 Essential (primary) hypertension: Secondary | ICD-10-CM | POA: Diagnosis not present

## 2020-09-15 DIAGNOSIS — Z8673 Personal history of transient ischemic attack (TIA), and cerebral infarction without residual deficits: Secondary | ICD-10-CM | POA: Diagnosis not present

## 2020-09-15 DIAGNOSIS — Z9181 History of falling: Secondary | ICD-10-CM | POA: Diagnosis not present

## 2020-09-15 DIAGNOSIS — F419 Anxiety disorder, unspecified: Secondary | ICD-10-CM | POA: Diagnosis not present

## 2020-09-15 DIAGNOSIS — H9190 Unspecified hearing loss, unspecified ear: Secondary | ICD-10-CM | POA: Diagnosis not present

## 2020-09-15 DIAGNOSIS — R32 Unspecified urinary incontinence: Secondary | ICD-10-CM | POA: Diagnosis not present

## 2020-09-15 DIAGNOSIS — M8580 Other specified disorders of bone density and structure, unspecified site: Secondary | ICD-10-CM | POA: Diagnosis not present

## 2020-09-15 DIAGNOSIS — F32A Depression, unspecified: Secondary | ICD-10-CM | POA: Diagnosis not present

## 2020-09-15 LAB — COMPREHENSIVE METABOLIC PANEL
ALT: 35 U/L (ref 0–35)
AST: 54 U/L — ABNORMAL HIGH (ref 0–37)
Albumin: 4.6 g/dL (ref 3.5–5.2)
Alkaline Phosphatase: 73 U/L (ref 39–117)
BUN: 6 mg/dL (ref 6–23)
CO2: 27 mEq/L (ref 19–32)
Calcium: 10.2 mg/dL (ref 8.4–10.5)
Chloride: 88 mEq/L — ABNORMAL LOW (ref 96–112)
Creatinine, Ser: 0.68 mg/dL (ref 0.40–1.20)
GFR: 92.51 mL/min (ref >60.00)
Glucose, Bld: 83 mg/dL (ref 70–99)
Potassium: 4.3 mEq/L (ref 3.5–5.1)
Sodium: 127 mEq/L — ABNORMAL LOW (ref 135–145)
Total Bilirubin: 0.7 mg/dL (ref 0.2–1.2)
Total Protein: 7.8 g/dL (ref 6.0–8.3)

## 2020-09-15 LAB — CBC
HCT: 38.6 % (ref 36.0–46.0)
Hemoglobin: 13.5 g/dL (ref 12.0–15.0)
MCHC: 34.9 g/dL (ref 30.0–36.0)
MCV: 104.8 fl — ABNORMAL HIGH (ref 78.0–100.0)
Platelets: 278 10*3/uL (ref 150.0–400.0)
RBC: 3.68 Mil/uL — ABNORMAL LOW (ref 3.87–5.11)
RDW: 12.6 % (ref 11.5–15.5)
WBC: 5.2 10*3/uL (ref 4.0–10.5)

## 2020-09-15 LAB — PROTIME-INR
INR: 1 ratio (ref 0.8–1.0)
Prothrombin Time: 10.8 s (ref 9.6–13.1)

## 2020-09-15 NOTE — Progress Notes (Signed)
Review of pertinent gastrointestinal problems: 1.  Routine risk for colon cancer.  Colonoscopy October 2019 found no polyps.  Repeat screening examination recommended at 10-year interval 2.  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 pylori, no signs of portal hypertension  Meld score 6 (05/2020 labs)\  Ultrasound October 2021 "fatty liver with sonographic findings of cirrhosis.  Dilation of the common bile duct (64mm), further evaluation with MRI, MRCP recommended  No ascites, normal platelets   HPI: This is a very pleasant 65 year old woman who is here with her sister or mother today.  She is in a wheelchair.  She seems to be quite unsteady in general on her feet.  She is a still drinking alcohol, beer but she has stopped drinking wine  No overt GI bleeding, no significant abdominal pains, no obvious encephalopathic events   Her weight is 1 pound lower than it was 4 months ago, same scale here in our office   ROS: complete GI ROS as described in HPI, all other review negative.  Constitutional:  No unintentional weight loss   Past Medical History:  Diagnosis Date  . Anxiety   . Cancer ([place]) 04/2014   squamous cell carcinoma L inner thigh  . Depression   . Hard of hearing   . Hypertension   . Osteopenia   . Substance abuse ([place])   . TIA (transient ischemic attack) 2016    Past Surgical History:  Procedure Laterality Date  . AUGMENTATION MAMMAPLASTY Bilateral 1988  . BREAST ENHANCEMENT SURGERY    . BUNIONECTOMY    . GUM SURGERY  11/2017  . NOSE SURGERY     Epistaxis  . TONSILLECTOMY      Current Outpatient Medications  Medication Sig  Dispense Refill  . alendronate (FOSAMAX) 70 MG tablet Take with a full glass of water on an empty stomach. 12 tablet 3  . amoxicillin (AMOXIL) 500 MG tablet Take 500 mg by mouth 3 (three) times daily.    . chlorhexidine (PERIDEX) 0.12 % solution SMARTSIG:By Mouth    . Coenzyme Q10 (COQ10) 100 MG CAPS Take 1 tablet by mouth daily.     . Cyanocobalamin (VITAMIN B-12 PO) Take 1 tablet by mouth daily.    Marland Kitchen lisinopril (ZESTRIL) 40 MG tablet TAKE 1 TABLET BY MOUTH EVERY DAY 90 tablet 1  . metoprolol succinate (TOPROL-XL) 100 MG 24 hr tablet Take 1 tablet (100 mg total) by mouth daily. Take with or immediately following a meal. 90 tablet 3  . mirabegron ER (MYRBETRIQ) 50 MG TB24 tablet Take 1 tablet (50 mg total) by mouth daily. 30 tablet 3  . Multiple Vitamin (MULTIVITAMIN) capsule Take by mouth.    . thiamine 250 MG tablet Take 250 mg by mouth daily.     No current facility-administered medications for this visit.    Allergies as of 09/15/2020 - Review Complete 09/15/2020  Allergen Reaction Noted  . Codeine Other (See Comments) 10/22/2007    Family History  Problem Relation Age of Onset  . Hypertension Mother   . Kidney disease Mother        dialysis  . Alzheimer's disease Mother   . Hyperlipidemia Father   . Stroke Paternal Grandmother   .  Rheum arthritis Paternal Grandmother   . Breast cancer Neg Hx   . Colon cancer Neg Hx   . Esophageal cancer Neg Hx   . Rectal cancer Neg Hx   . Stomach cancer Neg Hx     Social History   Socioeconomic History  . Marital status: Divorced    Spouse name: Not on file  . Number of children: Not on file  . Years of education: Not on file  . Highest education level: Not on file  Occupational History  . 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 Smoking status: Never Smoker  . Smokeless tobacco: Never Used  Vaping Use  . Vaping Use: Never used  Substance and Sexual Activity  . Alcohol use: Yes    Alcohol/week: 0.0 standard  drinks    Comment: 3 times a week, states hx of heavy use 15 years ago  . Drug use: No  . Sexual activity: Not Currently    Partners: Male  Other Topics Concern  . Not on file  Social History Narrative   Exercise--- gym 6x a week   Social Determinants of Health   Financial Resource Strain: Not on file  Food Insecurity: Not on file  Transportation Needs: Not on file  Physical Activity: Not on file  Stress: Not on file  Social Connections: Not on file  Intimate Partner Violence: Not on file     Physical Exam: BP (!) 142/80 (BP Location: Left Arm, Patient Position: Sitting, Cuff Size: Normal)   Pulse 72   Ht 4' 11.25" (1.505 m) Comment: height measured without shoes  Wt 111 lb 2 oz (50.4 kg)   BMI 22.26 kg/m  Constitutional: Chronically ill-appearing Psychiatric: alert and oriented x3 Abdomen: soft, nontender, nondistended, no obvious ascites, no peritoneal signs, normal bowel sounds No peripheral edema noted in lower extremities  Assessment and plan: 65 y.o. female with well compensated cirrhosis  Alcoholic liver disease, she has continued to drink I recommended strongly to her and her family member that she try to cut back even further and to stop altogether.  I would like to round out her cirrhosis work-up with additional lab tests, see those in her AVS.  Also restage her liver disease with CBC, complete metabolic profile, coags.  We will start hepatoma screening with ultrasounds in 6 months from now and an office visit with me shortly after that.  Please see the "Patient Instructions" section for addition details about the plan.  Owens Loffler, MD [facility] 09/15/2020, 9:01 AM   Total time on date of encounter was 30 minutes (this included time spent preparing to see the patient reviewing records; obtaining and/or reviewing separately obtained history; performing a medically appropriate exam and/or evaluation; counseling and educating the patient and family  if present; ordering medications, tests or procedures if applicable; and documenting clinical information in the health record).

## 2020-09-15 NOTE — Patient Instructions (Addendum)
If you are age 64 or younger, your body mass index should be between 19-25. Your Body mass index is 22.26 kg/m. If this is out of the aformentioned range listed, please consider follow up with your Primary Care Provider.   Your provider has requested that you go to the basement level for lab work before leaving today. Press "B" on the elevator. The lab is located at the first door on the left as you exit the elevator.  Due to recent changes in healthcare laws, you may see the results of your imaging and laboratory studies on MyChart before your provider has had a chance to review them.  We understand that in some cases there may be results that are confusing or concerning to you. Not all laboratory results come back in the same time frame and the provider may be waiting for multiple results in order to interpret others.  Please give [place] 48 hours in order for your provider to thoroughly review all the results before contacting the office for clarification of your results.   You will need an abdominal ultrasound attn: liver in 6 months (September 2022).  We will contact you to schedule the appointment.  Please decrease your alcoholic consumption.   Thank you for entrusting me with your care and choosing [facility].  Dr Ardis Hughs

## 2020-09-18 LAB — ANTI-SMOOTH MUSCLE ANTIBODY, IGG: Actin (Smooth Muscle) Antibody (IGG): <20 U (ref <20)

## 2020-09-18 LAB — MITOCHONDRIAL ANTIBODIES: Mitochondrial M2 Ab, IgG: <=20 U

## 2020-09-18 LAB — HEPATITIS A ANTIBODY, TOTAL: Hepatitis A AB,Total: REACTIVE — AB

## 2020-09-18 LAB — ALPHA-1-ANTITRYPSIN: A-1 Antitrypsin, Ser: 149 mg/dL (ref 83–199)

## 2020-09-18 LAB — CERULOPLASMIN: Ceruloplasmin: 34 mg/dL (ref 18–53)

## 2020-12-27 ENCOUNTER — Other Ambulatory Visit: Payer: Self-pay | Admitting: Family Medicine

## 2020-12-27 ENCOUNTER — Encounter: Payer: Self-pay | Admitting: Family Medicine

## 2020-12-27 ENCOUNTER — Other Ambulatory Visit: Payer: Self-pay

## 2020-12-27 ENCOUNTER — Telehealth (INDEPENDENT_AMBULATORY_CARE_PROVIDER_SITE_OTHER): Payer: BC Managed Care – PPO | Admitting: Family Medicine

## 2020-12-27 VITALS — BP 130/78 | HR 71 | Ht 59.5 in | Wt 112.0 lb

## 2020-12-27 DIAGNOSIS — I10 Essential (primary) hypertension: Secondary | ICD-10-CM

## 2020-12-27 DIAGNOSIS — E785 Hyperlipidemia, unspecified: Secondary | ICD-10-CM | POA: Diagnosis not present

## 2020-12-27 DIAGNOSIS — K7030 Alcoholic cirrhosis of liver without ascites: Secondary | ICD-10-CM

## 2020-12-27 DIAGNOSIS — M8580 Other specified disorders of bone density and structure, unspecified site: Secondary | ICD-10-CM

## 2020-12-27 DIAGNOSIS — M810 Age-related osteoporosis without current pathological fracture: Secondary | ICD-10-CM

## 2020-12-27 DIAGNOSIS — E2839 Other primary ovarian failure: Secondary | ICD-10-CM

## 2020-12-27 DIAGNOSIS — Z1231 Encounter for screening mammogram for malignant neoplasm of breast: Secondary | ICD-10-CM

## 2020-12-27 IMAGING — US US ABDOMEN COMPLETE
1 series · 13 of 25 positions shown · non-contrast
Comparison: None.

CLINICAL DATA: 64-year-old female with elevated LFTs.

EXAM:
ABDOMEN ULTRASOUND COMPLETE

[Series 1: us abdomen complete · 13 of 61 slices shown]
[im 1/61]
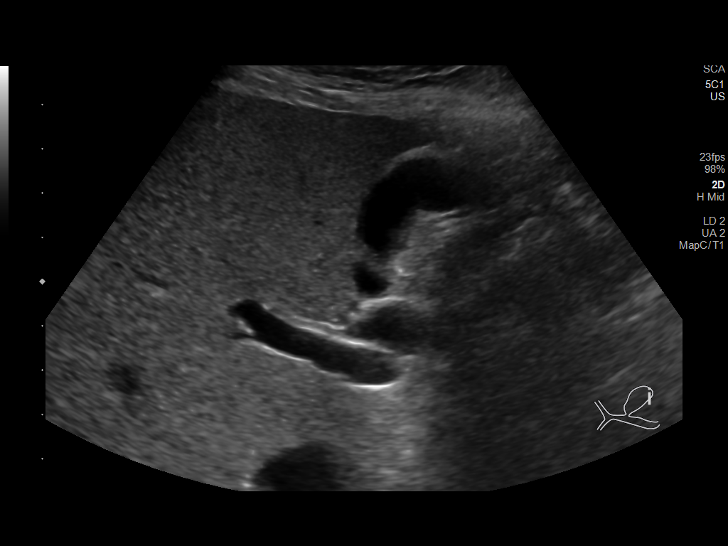
[im 6/61]
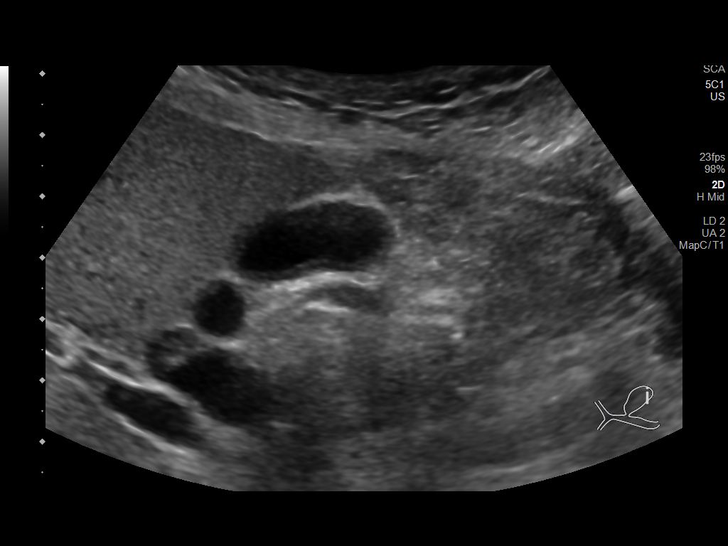
[im 11/61]
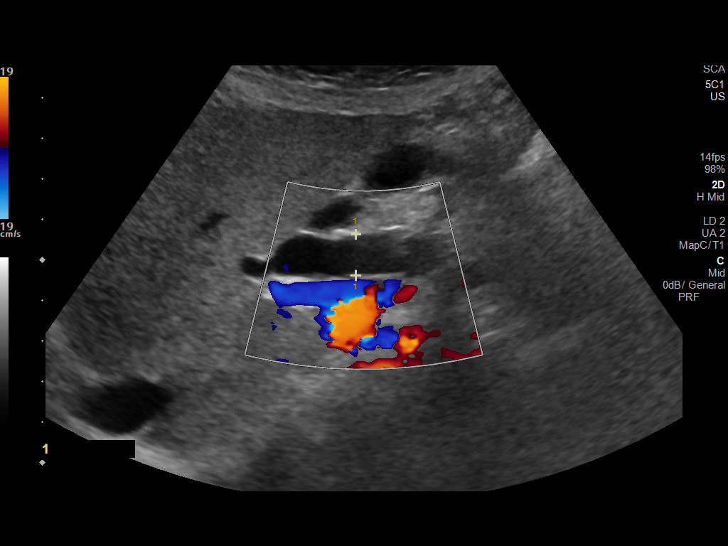
[im 16/61]
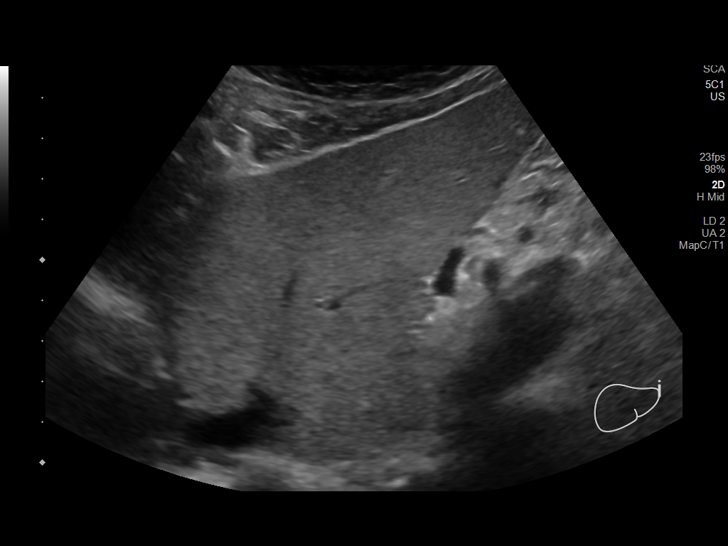
[im 21/61]
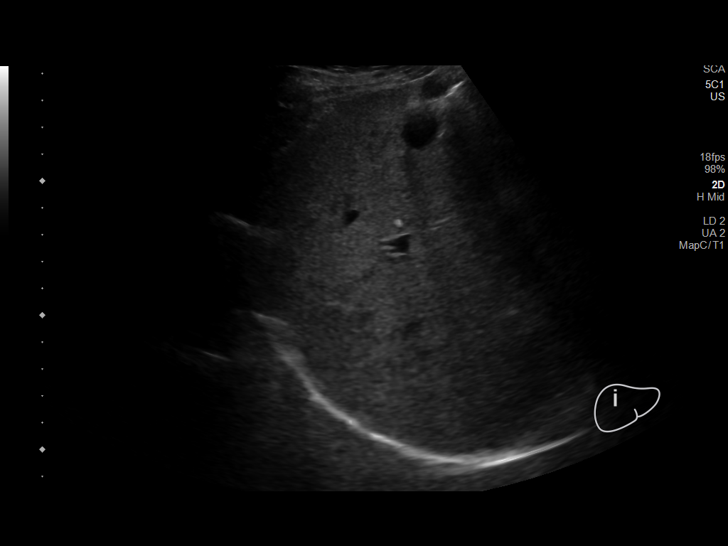
[im 26/61]
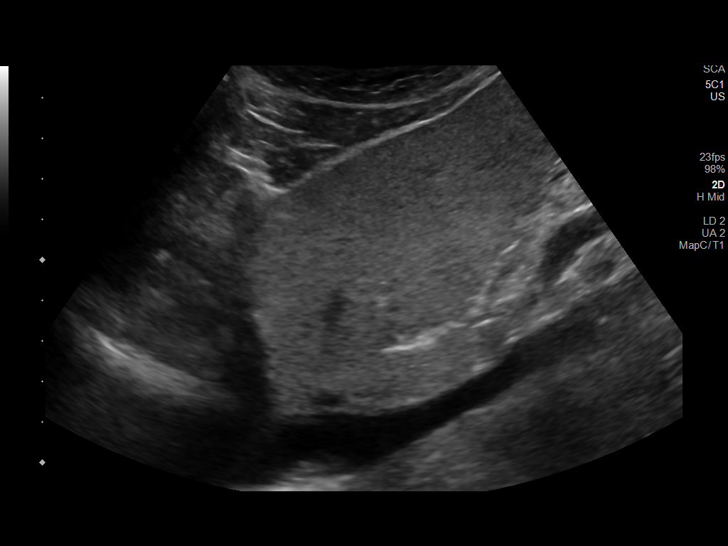
[im 31/61]
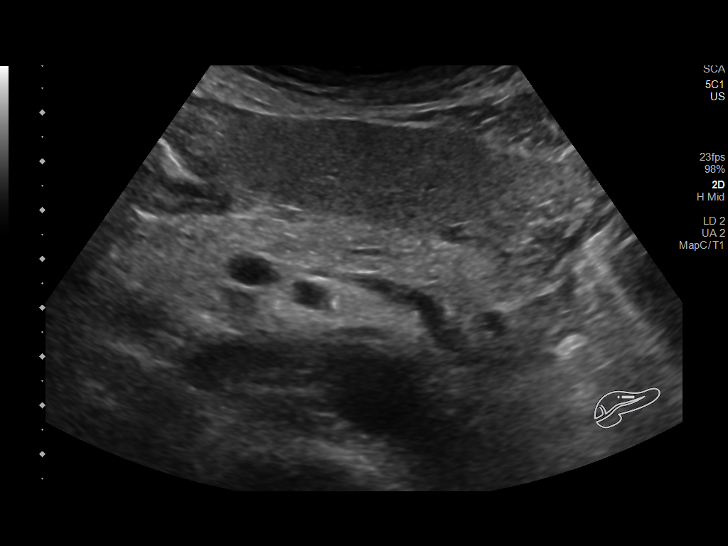
[im 36/61]
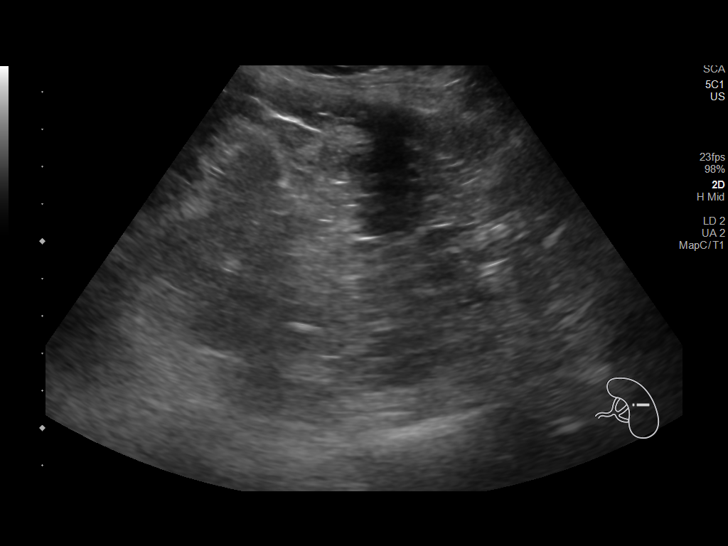
[im 41/61]
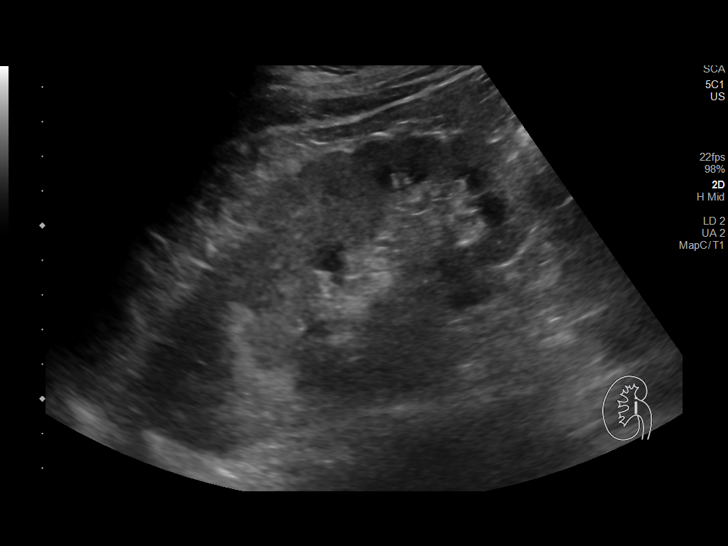
[im 46/61]
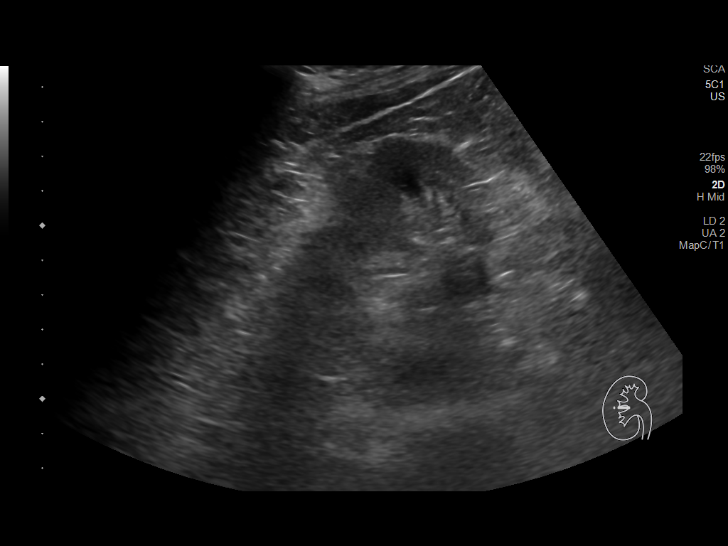
[im 51/61]
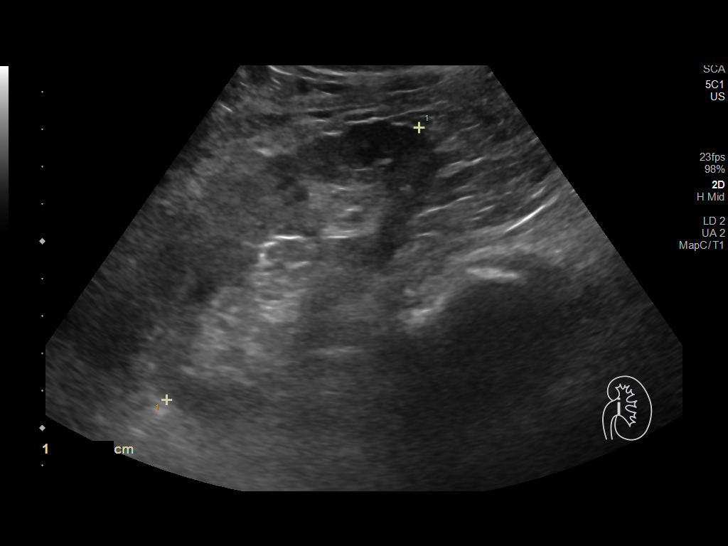
[im 56/61]
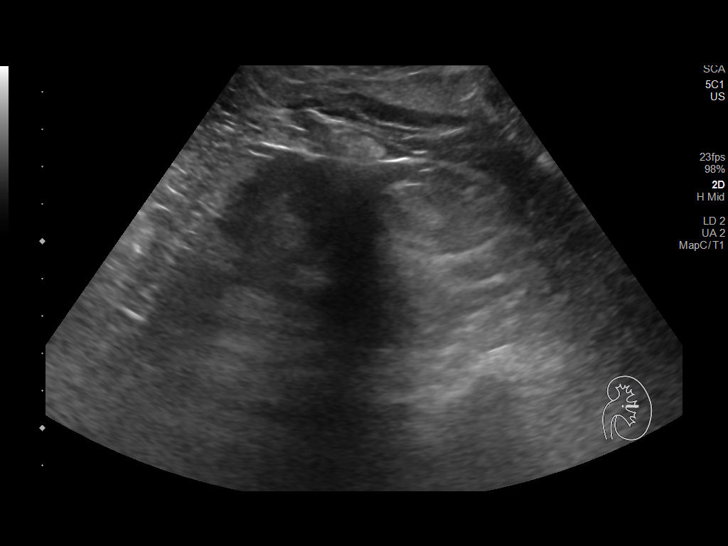
[im 61/61]
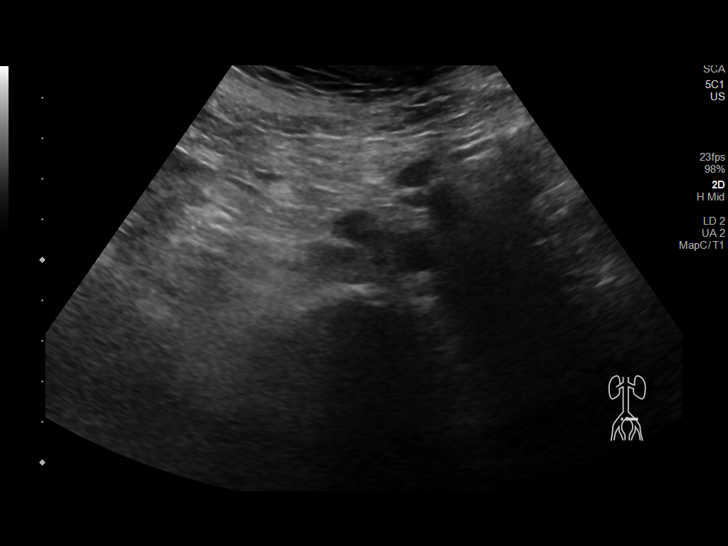

[13 of 25 positions shown; findings below may reference images not displayed]

FINDINGS: Gallbladder: No gallstone or pericholecystic fluid. Top-normal
gallbladder wall measuring 3 mm in thickness. Negative sonographic
Murphy's sign.

Common bile duct: Diameter: 10 mm. There is dilatation of the common
bile duct. A central obstruction is not excluded. Further evaluation
with MRI/MRCP recommended.

Liver: There is diffuse increased liver echogenicity with lobulated
margin consistent with morphologic changes of cirrhosis. Portal vein
is patent on color Doppler imaging with normal direction of blood
flow towards the liver.

IVC: No abnormality visualized.

Pancreas: Visualized portion unremarkable.

Spleen: Size and appearance within normal limits.

Right Kidney: Length: 10.5 cm. Normal echogenicity. No
hydronephrosis or shadowing stone.

Left Kidney: Length: 10.4 cm. Normal echogenicity. No hydronephrosis
or shadowing stone.

Abdominal aorta: No aneurysm visualized.

Other findings: None.
IMPRESSION: 1. Fatty liver with sonographic findings of cirrhosis.
2. Patent main portal vein with hepatopetal flow.
3. Dilatation of the common bile duct. Further evaluation with
MRI/MRCP recommended.

## 2020-12-27 MED ORDER — ALENDRONATE SODIUM 70 MG PO TABS: ORAL_TABLET | ORAL | 3 refills | Status: DC

## 2020-12-27 MED ORDER — LISINOPRIL 40 MG PO TABS: 40.0000 mg | ORAL_TABLET | Freq: Every day | ORAL | 1 refills | Status: DC

## 2020-12-27 MED ORDER — METOPROLOL SUCCINATE ER 100 MG PO TB24: 100.0000 mg | ORAL_TABLET | Freq: Every day | ORAL | 1 refills | Status: DC

## 2020-12-27 NOTE — Assessment & Plan Note (Signed)
Per GI

## 2020-12-27 NOTE — Progress Notes (Signed)
MyChart Video Visit    Virtual Visit via Video Note   This visit type was conducted due to national recommendations for restrictions regarding the COVID-19 Pandemic (e.g. social distancing) in an effort to limit this patient's exposure and mitigate transmission in our community. This patient is at least at moderate risk for complications without adequate follow up. This format is felt to be most appropriate for this patient at this time. Physical exam was limited by quality of the video and audio technology used for the visit. [name] was able to get the patient set up on a video visit.  Patient location: Home Patient and provider in visit Provider location: Office  I discussed the limitations of evaluation and management by telemedicine and the availability of in person appointments. The patient expressed understanding and agreed to proceed.  Visit Date: 12/27/2020  Today's healthcare provider: Ann Held, DO     Subjective:    Patient ID: [name], female    DOB: 01-11-1956, 65 y.o.   MRN: [id number]  Chief Complaint  Patient presents with   Hypertension   Cirrhosis   Follow-up    HPI Patient is in today for f/u bp and cirrhosis.    She has a f/u with gi next week.  Pt has no new complaints   She had to transition to phone call due to sound problems --- >50% 25 min spent reviewing chart and talking to pt before transition to phone call.    Past Medical History:  Diagnosis Date   Anxiety    Cancer ([place]) 04/2014   squamous cell carcinoma L inner thigh   Depression    Hard of hearing    Hypertension    Osteopenia    Substance abuse ([place])    TIA (transient ischemic attack) 2016    Past Surgical History:  Procedure Laterality Date   AUGMENTATION MAMMAPLASTY Bilateral 1988   BREAST ENHANCEMENT SURGERY     BUNIONECTOMY     GUM SURGERY  11/2017   NOSE SURGERY     Epistaxis   TONSILLECTOMY      Family History  Problem Relation Age  of Onset   Hypertension Mother    Kidney disease Mother        dialysis   Alzheimer's disease Mother    Hyperlipidemia Father    Stroke Paternal Grandmother    Rheum arthritis Paternal Grandmother    Breast cancer Neg Hx    Colon cancer Neg Hx    Esophageal cancer Neg Hx    Rectal cancer Neg Hx    Stomach cancer Neg Hx     Social History   Socioeconomic History   Marital status: Divorced    Spouse name: Not on file   Number of children: Not on file   Years of education: Not on file   Highest education level: Not on file  Occupational History   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  Tobacco Use   Smoking status: Never   Smokeless tobacco: Never  Vaping Use   Vaping Use: Never used  Substance and Sexual Activity   Alcohol use: Yes    Alcohol/week: 0.0 standard drinks    Comment: 3 times a week, states hx of heavy use 15 years ago   Drug use: No   Sexual activity: Not Currently    Partners: Male  Other Topics Concern   Not on file  Social History Narrative   Exercise--- gym 6x a week  Social Determinants of Health   Financial Resource Strain: Not on file  Food Insecurity: Not on file  Transportation Needs: Not on file  Physical Activity: Not on file  Stress: Not on file  Social Connections: Not on file  Intimate Partner Violence: Not on file    Outpatient Medications Prior to Visit  Medication Sig Dispense Refill   amoxicillin (AMOXIL) 500 MG tablet Take 500 mg by mouth 3 (three) times daily.     chlorhexidine (PERIDEX) 0.12 % solution SMARTSIG:By Mouth     Coenzyme Q10 (COQ10) 100 MG CAPS Take 1 tablet by mouth daily.      Cyanocobalamin (VITAMIN B-12 PO) Take 1 tablet by mouth daily.     mirabegron ER (MYRBETRIQ) 50 MG TB24 tablet Take 1 tablet (50 mg total) by mouth daily. 30 tablet 3   Multiple Vitamin (MULTIVITAMIN) capsule Take by mouth.     thiamine 250 MG tablet Take 250 mg by mouth daily.     alendronate (FOSAMAX) 70 MG tablet Take with a full  glass of water on an empty stomach. 12 tablet 3   lisinopril (ZESTRIL) 40 MG tablet TAKE 1 TABLET BY MOUTH EVERY DAY 90 tablet 1   metoprolol succinate (TOPROL-XL) 100 MG 24 hr tablet Take 1 tablet (100 mg total) by mouth daily. Take with or immediately following a meal. 90 tablet 3   COVID-19 mRNA vaccine, Pfizer, 30 MCG/0.3ML injection INJECT AS DIRECTED (Patient not taking: Reported on 12/27/2020) .3 mL 0   No facility-administered medications prior to visit.    Allergies  Allergen Reactions   Codeine Other (See Comments)    REACTION: insomnia and anxious    Review of Systems  Constitutional:  Negative for chills and fever.  HENT:  Negative for ear discharge, ear pain, sinus pain, sore throat and tinnitus.   Eyes:  Negative for pain, discharge and redness.  Respiratory:  Negative for cough, hemoptysis, shortness of breath and wheezing.   Cardiovascular:  Negative for chest pain and palpitations.  Gastrointestinal:  Negative for abdominal pain, blood in stool, constipation, diarrhea, nausea and vomiting.  Genitourinary:  Negative for frequency.  Musculoskeletal:  Negative for back pain, joint pain and neck pain.  Neurological:  Negative for speech change, focal weakness and weakness.  Psychiatric/Behavioral:  Negative for hallucinations and substance abuse. The patient is not nervous/anxious.       Objective:    Physical Exam Constitutional:      General: She is not in acute distress.    Appearance: Normal appearance. She is not ill-appearing.  HENT:     Head: Normocephalic and atraumatic.     Right Ear: External ear normal.     Left Ear: External ear normal.     Nose: Nose normal.  Eyes:     Extraocular Movements: Extraocular movements intact.     Pupils: Pupils are equal, round, and reactive to light.  Pulmonary:     Effort: No respiratory distress.  Musculoskeletal:        General: Normal range of motion.     Cervical back: Normal range of motion.  Neurological:      Mental Status: She is alert and oriented to person, place, and time.  Psychiatric:        Behavior: Behavior normal.        Thought Content: Thought content normal.    BP 130/78   Pulse 71   Ht 4' 11.5" (1.511 m)   Wt 112 lb (50.8 kg)   BMI  22.24 kg/m  Wt Readings from Last 3 Encounters:  12/27/20 112 lb (50.8 kg)  09/15/20 111 lb 2 oz (50.4 kg)  07/28/20 112 lb (50.8 kg)    Diabetic Foot Exam - Simple   No data filed    Lab Results  Component Value Date   WBC 5.2 09/15/2020   HGB 13.5 09/15/2020   HCT 38.6 09/15/2020   PLT 278.0 09/15/2020   GLUCOSE 83 09/15/2020   CHOL 254 (H) 06/28/2020   TRIG 142.0 06/28/2020   HDL 99.40 06/28/2020   LDLDIRECT 127.6 03/28/2013   LDLCALC 126 (H) 06/28/2020   ALT 35 09/15/2020   AST 54 (H) 09/15/2020   NA 127 (L) 09/15/2020   K 4.3 09/15/2020   CL 88 (L) 09/15/2020   CREATININE 0.68 09/15/2020   BUN 6 09/15/2020   CO2 27 09/15/2020   TSH 0.78 06/28/2020   INR 1.0 09/15/2020   HGBA1C 5.2 06/14/2015   MICROALBUR 1.0 05/29/2014    Lab Results  Component Value Date   TSH 0.78 06/28/2020   Lab Results  Component Value Date   WBC 5.2 09/15/2020   HGB 13.5 09/15/2020   HCT 38.6 09/15/2020   MCV 104.8 (H) 09/15/2020   PLT 278.0 09/15/2020   Lab Results  Component Value Date   NA 127 (L) 09/15/2020   K 4.3 09/15/2020   CO2 27 09/15/2020   GLUCOSE 83 09/15/2020   BUN 6 09/15/2020   CREATININE 0.68 09/15/2020   BILITOT 0.7 09/15/2020   ALKPHOS 73 09/15/2020   AST 54 (H) 09/15/2020   ALT 35 09/15/2020   PROT 7.8 09/15/2020   ALBUMIN 4.6 09/15/2020   CALCIUM 10.2 09/15/2020   ANIONGAP 9 10/09/2015   GFR 92.51 09/15/2020   Lab Results  Component Value Date   CHOL 254 (H) 06/28/2020   Lab Results  Component Value Date   HDL 99.40 06/28/2020   Lab Results  Component Value Date   LDLCALC 126 (H) 06/28/2020   Lab Results  Component Value Date   TRIG 142.0 06/28/2020   Lab Results  Component Value Date    CHOLHDL 3 06/28/2020   Lab Results  Component Value Date   HGBA1C 5.2 06/14/2015       Assessment & Plan:   Problem List Items Addressed This Visit       Unprioritized   Alcoholic cirrhosis ([place])    Per GI        Essential hypertension    Well controlled, no changes to meds. Encouraged heart healthy diet such as the DASH diet and exercise as tolerated.        Relevant Medications   metoprolol succinate (TOPROL-XL) 100 MG 24 hr tablet   lisinopril (ZESTRIL) 40 MG tablet   Other Visit Diagnoses     Primary hypertension    -  Primary   Relevant Medications   metoprolol succinate (TOPROL-XL) 100 MG 24 hr tablet   lisinopril (ZESTRIL) 40 MG tablet   Other Relevant Orders   Lipid panel   CBC with Differential/Platelet   TSH   Comprehensive metabolic panel   Hyperlipidemia, unspecified hyperlipidemia type       Relevant Medications   metoprolol succinate (TOPROL-XL) 100 MG 24 hr tablet   lisinopril (ZESTRIL) 40 MG tablet   Other Relevant Orders   Lipid panel   CBC with Differential/Platelet   TSH   Comprehensive metabolic panel   Age related osteoporosis, unspecified pathological fracture presence       Relevant  Medications   alendronate (FOSAMAX) 70 MG tablet   Other Relevant Orders   Comprehensive metabolic panel   Osteopenia, unspecified location       Relevant Medications   alendronate (FOSAMAX) 70 MG tablet   Estrogen deficiency       Relevant Medications   alendronate (FOSAMAX) 70 MG tablet         Meds ordered this encounter  Medications   metoprolol succinate (TOPROL-XL) 100 MG 24 hr tablet    Sig: Take 1 tablet (100 mg total) by mouth daily. Take with or immediately following a meal.    Dispense:  90 tablet    Refill:  1   lisinopril (ZESTRIL) 40 MG tablet    Sig: Take 1 tablet (40 mg total) by mouth daily.    Dispense:  90 tablet    Refill:  1   alendronate (FOSAMAX) 70 MG tablet    Sig: Take with a full glass of water on an empty  stomach.    Dispense:  12 tablet    Refill:  3    I discussed the assessment and treatment plan with the patient. The patient was provided an opportunity to ask questions and all were answered. The patient agreed with the plan and demonstrated an understanding of the instructions.   The patient was advised to call back or seek an in-person evaluation if the symptoms worsen or if the condition fails to improve as anticipated.  I provided 25 minutes of face-to-face time during this encounter.   Ann Held, DO [place] [facility] [phone] (phone) [phone] (fax)  [place]

## 2020-12-27 NOTE — Assessment & Plan Note (Signed)
Well controlled, no changes to meds. Encouraged heart healthy diet such as the DASH diet and exercise as tolerated.  °

## 2021-02-11 ENCOUNTER — Other Ambulatory Visit: Payer: Self-pay | Admitting: Family Medicine

## 2021-02-11 DIAGNOSIS — I10 Essential (primary) hypertension: Secondary | ICD-10-CM

## 2021-02-18 ENCOUNTER — Ambulatory Visit: Payer: BC Managed Care – PPO

## 2021-02-21 ENCOUNTER — Telehealth: Payer: Self-pay

## 2021-02-21 ENCOUNTER — Emergency Department (HOSPITAL_COMMUNITY)
Admission: EM | Admit: 2021-02-21 | Discharge: 2021-02-21 | Discharge status: Against Medical Advice | Payer: BC Managed Care – PPO | Attending: Emergency Medicine | Admitting: Emergency Medicine

## 2021-02-21 ENCOUNTER — Emergency Department (HOSPITAL_COMMUNITY): Payer: BC Managed Care – PPO

## 2021-02-21 ENCOUNTER — Encounter (HOSPITAL_COMMUNITY): Payer: Self-pay

## 2021-02-21 ENCOUNTER — Other Ambulatory Visit: Payer: Self-pay

## 2021-02-21 DIAGNOSIS — Z85828 Personal history of other malignant neoplasm of skin: Secondary | ICD-10-CM | POA: Insufficient documentation

## 2021-02-21 DIAGNOSIS — I672 Cerebral atherosclerosis: Secondary | ICD-10-CM | POA: Diagnosis not present

## 2021-02-21 DIAGNOSIS — S4991XA Unspecified injury of right shoulder and upper arm, initial encounter: Secondary | ICD-10-CM | POA: Diagnosis not present

## 2021-02-21 DIAGNOSIS — Z79899 Other long term (current) drug therapy: Secondary | ICD-10-CM | POA: Diagnosis not present

## 2021-02-21 DIAGNOSIS — E871 Hypo-osmolality and hyponatremia: Secondary | ICD-10-CM | POA: Diagnosis not present

## 2021-02-21 DIAGNOSIS — W19XXXA Unspecified fall, initial encounter: Secondary | ICD-10-CM | POA: Insufficient documentation

## 2021-02-21 DIAGNOSIS — S40021A Contusion of right upper arm, initial encounter: Secondary | ICD-10-CM | POA: Diagnosis not present

## 2021-02-21 DIAGNOSIS — S0990XA Unspecified injury of head, initial encounter: Secondary | ICD-10-CM | POA: Diagnosis not present

## 2021-02-21 DIAGNOSIS — F10929 Alcohol use, unspecified with intoxication, unspecified: Secondary | ICD-10-CM | POA: Diagnosis not present

## 2021-02-21 DIAGNOSIS — R251 Tremor, unspecified: Secondary | ICD-10-CM | POA: Insufficient documentation

## 2021-02-21 DIAGNOSIS — F10129 Alcohol abuse with intoxication, unspecified: Secondary | ICD-10-CM | POA: Diagnosis not present

## 2021-02-21 DIAGNOSIS — I10 Essential (primary) hypertension: Secondary | ICD-10-CM | POA: Diagnosis not present

## 2021-02-21 DIAGNOSIS — R0902 Hypoxemia: Secondary | ICD-10-CM | POA: Diagnosis not present

## 2021-02-21 LAB — COMPREHENSIVE METABOLIC PANEL
ALT: 37 U/L (ref 0–44)
AST: 65 U/L — ABNORMAL HIGH (ref 15–41)
Albumin: 4.7 g/dL (ref 3.5–5.0)
Alkaline Phosphatase: 80 U/L (ref 38–126)
Anion gap: 14 (ref 5–15)
BUN: 6 mg/dL — ABNORMAL LOW (ref 8–23)
CO2: 23 mmol/L (ref 22–32)
Calcium: 9.1 mg/dL (ref 8.9–10.3)
Chloride: 83 mmol/L — ABNORMAL LOW (ref 98–111)
Creatinine, Ser: 0.64 mg/dL (ref 0.44–1.00)
GFR, Estimated: >60 mL/min (ref >60)
Glucose, Bld: 91 mg/dL (ref 70–99)
Potassium: 4.7 mmol/L (ref 3.5–5.1)
Sodium: 120 mmol/L — ABNORMAL LOW (ref 135–145)
Total Bilirubin: 1.4 mg/dL — ABNORMAL HIGH (ref 0.3–1.2)
Total Protein: 7.5 g/dL (ref 6.5–8.1)

## 2021-02-21 LAB — CBC WITH DIFFERENTIAL/PLATELET
Abs Immature Granulocytes: 0.04 10*3/uL (ref 0.00–0.07)
Basophils Absolute: 0 10*3/uL (ref 0.0–0.1)
Basophils Relative: 1 %
Eosinophils Absolute: 0 10*3/uL (ref 0.0–0.5)
Eosinophils Relative: 0 %
HCT: 32.6 % — ABNORMAL LOW (ref 36.0–46.0)
Hemoglobin: 12 g/dL (ref 12.0–15.0)
Immature Granulocytes: 1 %
Lymphocytes Relative: 21 %
Lymphs Abs: 1.5 10*3/uL (ref 0.7–4.0)
MCH: 37 pg — ABNORMAL HIGH (ref 26.0–34.0)
MCHC: 36.8 g/dL — ABNORMAL HIGH (ref 30.0–36.0)
MCV: 100.6 fL — ABNORMAL HIGH (ref 80.0–100.0)
Monocytes Absolute: 0.9 10*3/uL (ref 0.1–1.0)
Monocytes Relative: 13 %
Neutro Abs: 4.7 10*3/uL (ref 1.7–7.7)
Neutrophils Relative %: 64 %
Platelets: 314 10*3/uL (ref 150–400)
RBC: 3.24 MIL/uL — ABNORMAL LOW (ref 3.87–5.11)
RDW: 11.3 % — ABNORMAL LOW (ref 11.5–15.5)
WBC: 7.3 10*3/uL (ref 4.0–10.5)
nRBC: 0 % (ref 0.0–0.2)

## 2021-02-21 LAB — AMMONIA: Ammonia: 13 umol/L (ref 9–35)

## 2021-02-21 LAB — ETHANOL: Alcohol, Ethyl (B): 172 mg/dL — ABNORMAL HIGH (ref <10)

## 2021-02-21 MED ORDER — SODIUM CHLORIDE 0.9 % IV SOLN
1.0000 mg | Freq: Once | INTRAVENOUS | Status: DC
  Filled 2021-02-21: qty 0.2

## 2021-02-21 MED ORDER — THIAMINE HCL 100 MG/ML IJ SOLN: 100.0000 mg | Freq: Every day | INTRAMUSCULAR | Status: DC

## 2021-02-21 MED ORDER — SODIUM CHLORIDE 0.9 % IV BOLUS (SEPSIS): 1000.0000 mL | Freq: Once | INTRAVENOUS | Status: DC

## 2021-02-21 MED ORDER — SODIUM CHLORIDE 0.9 % IV SOLN: 1000.0000 mL | INTRAVENOUS | Status: DC

## 2021-02-21 NOTE — Discharge Instructions (Addendum)
I have recommended you stay in the emergency room for further treatment of urinary sodium level and alcohol level.  We also were not able to get your shoulder x-rays completed.  Please follow-up with your primary care doctor to be rechecked.  Return as needed for any recurrent or worsening symptoms.  Or if you change your mind at any time

## 2021-02-21 NOTE — Telephone Encounter (Signed)
Called patient and LM regarding her already scheduled appointment with Dr. Ardis Hughs on Tuesday the 13th of September. She also needs a RUQ U/S prior to that appointment.  I have scheduled the ultrasound for Tuesday, 9-6 at 8 am at [facility] and have sent the appointment information to her via [place]. She has been supplied the phone number for Radiology in case she wants to reschedule for a better date or time.

## 2021-02-21 NOTE — Telephone Encounter (Signed)
-----   Message from Stevan Born, [place] sent at 09/15/2020  9:19 AM EST ----- Regarding: abd u/s attn;liver Pt needs abd u/s ATTN; liver per Dr Ardis Hughs, dx alcoholic cirr in Sept [id number]: will also need follow up after with DJ

## 2021-02-21 NOTE — Telephone Encounter (Signed)
Patient called back and LM that she can not see her MyChart. Called patient back and left detailed message with information regarding her Ultrasound appointment at [facility] on Tuesday, 9-6 (typo in [place] says 9-9) at 8am.  Patient needs to arrive at 7:45 am and be NPO after midnight.  Can call [phone] if she wants to reschedule.

## 2021-02-21 NOTE — ED Notes (Signed)
Provider at the bedside to evaluate. 

## 2021-02-21 NOTE — ED Notes (Signed)
Pt son signed AMA form for pt. Pt unable to sign form due to no signature pad being available. Pt verbalizes understanding of leaving AMA

## 2021-02-21 NOTE — ED Triage Notes (Signed)
Pt BIB EMS from home. Pt reports chronic weakness that has caused her to fall multiple times. Pt endorses ETOH use today. Pt has multiple abrasions and bruising.   181/108 HR 86

## 2021-02-21 NOTE — ED Provider Notes (Cosign Needed)
Emergency Medicine Provider Triage Evaluation Note  [name] , a 65 y.o. female  was evaluated in triage.  Pt complains of numerous falls over the past few months. Patient is unable to tell me why she falls, so unclear if it is mechanical or not. She admits to falling twice today. No head injury or LOC. She sustained abrasions to bilateral legs. Unsure when her last tetanus shot was. No CP or SOB.   Review of Systems  Positive: falls Negative: CP  Physical Exam  There were no vitals taken for this visit. Gen:   Awake, no distress   Resp:  Normal effort  MSK:   Moves extremities without difficulty  Other:    Medical Decision Making  Medically screening exam initiated at 5:08 PM.  Appropriate orders placed.  [name] was informed that the remainder of the evaluation will be completed by another provider, this initial triage assessment does not replace that evaluation, and the importance of remaining in the ED until their evaluation is complete.  Labs CT head   [name] 02/21/21 1710

## 2021-02-21 NOTE — ED Notes (Signed)
Dr. Tomi Bamberger notified that pt wants to leave AMA

## 2021-02-21 NOTE — ED Notes (Signed)
Dr.Knapp at bedside  

## 2021-02-21 NOTE — ED Provider Notes (Signed)
[facility] DEPT Provider Note   CSN: [id number] Arrival date & time: 02/21/21  1645     History Chief Complaint  Patient presents with   [name] is a 65 y.o. female.   Fall  Alcohol Intoxication   Patient presents to the ED for evaluation after a fall.  Patient states she has been having issues with gait instability and falling.  She has been having these issues off and on for the last couple of months.  Patient states she fell twice today.  Her neighbor had to help her get up.  She is not exactly sure why she fell.  She feels like she was just weak.  Patient denies hitting her head or losing consciousness.  She is not having focal numbness or weakness in her arms or legs.  She is having some pain in her shoulder.  No fevers or chills.  No chest pain or shortness of breath.  No abdominal pain.  Past Medical History:  Diagnosis Date   Anxiety    Cancer ([place]) 04/2014   squamous cell carcinoma L inner thigh   Depression    Hard of hearing    Hypertension    Osteopenia    Substance abuse ([place])    TIA (transient ischemic attack) 2016    Patient Active Problem List   Diagnosis Date Noted   Alcoholic cirrhosis ([place]) [id number]   Ataxia 03/22/2020   Alcohol withdrawal ([place]) 10/09/2015   Nausea, vomiting and diarrhea 10/07/2015   Diarrhea 10/07/2015   Anxiety    Acute ischemic stroke ([place])    Left leg weakness 06/13/2015   TIA (transient ischemic attack) 06/13/2015   Hyponatremia 06/13/2015   Cornea scar 09/24/2012   H/O corneal ulcer 09/24/2012   Bronchitis 06/14/2012   Error, refractive, myopia 02/29/2012   Presbyopia 01/25/2012   H/O ETOH abuse 09/13/2011   DYSURIA 10/22/2009   PROTEINURIA, MILD 04/21/2009   ABNORMAL FINDINGS GI TRACT 12/03/2007   Essential hypertension 10/22/2007    Past Surgical History:  Procedure Laterality Date   AUGMENTATION MAMMAPLASTY Bilateral 1988   BREAST ENHANCEMENT SURGERY      BUNIONECTOMY     GUM SURGERY  11/2017   NOSE SURGERY     Epistaxis   TONSILLECTOMY       OB History   No obstetric history on file.     Family History  Problem Relation Age of Onset   Hypertension Mother    Kidney disease Mother        dialysis   Alzheimer's disease Mother    Hyperlipidemia Father    Stroke Paternal Grandmother    Rheum arthritis Paternal Grandmother    Breast cancer Neg Hx    Colon cancer Neg Hx    Esophageal cancer Neg Hx    Rectal cancer Neg Hx    Stomach cancer Neg Hx     Social History   Tobacco Use   Smoking status: Never   Smokeless tobacco: Never  Vaping Use   Vaping Use: Never used  Substance Use Topics   Alcohol use: Yes    Alcohol/week: 0.0 standard drinks    Comment: 3 times a week, states hx of heavy use 15 years ago   Drug use: No    Home Medications Prior to Admission medications   Medication Sig Start Date End Date Taking? Authorizing Provider  alendronate (FOSAMAX) 70 MG tablet Take with a full glass of water on an empty stomach. 12/27/20  Carollee Herter, Yvonne R, DO  amoxicillin (AMOXIL) 500 MG tablet Take 500 mg by mouth 3 (three) times daily. 07/21/20   [provider]  chlorhexidine (PERIDEX) 0.12 % solution SMARTSIG:By Mouth 09/08/20   [provider]  Coenzyme Q10 (COQ10) 100 MG CAPS Take 1 tablet by mouth daily.     [provider]  Cyanocobalamin (VITAMIN B-12 PO) Take 1 tablet by mouth daily.    [provider]  lisinopril (ZESTRIL) 40 MG tablet Take 1 tablet (40 mg total) by mouth daily. 12/27/20   Ann Held, DO  metoprolol succinate (TOPROL-XL) 100 MG 24 hr tablet Take 1 tablet (100 mg total) by mouth daily. Take with or immediately following a meal. 12/27/20   Carollee Herter, Alferd Apa, DO  mirabegron ER (MYRBETRIQ) 50 MG TB24 tablet Take 1 tablet (50 mg total) by mouth daily. 06/28/20   Ann Held, DO  Multiple Vitamin (MULTIVITAMIN) capsule Take by mouth.    [provider]  thiamine 250 MG tablet Take 250 mg by mouth daily.    [provider]    Allergies    Codeine  Review of Systems   Review of Systems  All other systems reviewed and are negative.  Physical Exam Updated Vital Signs BP (!) 144/87 (BP Location: Right Arm)   Pulse 74   Temp 97.6 F (36.4 C) (Oral)   Resp 18   SpO2 99%   Physical Exam Vitals and nursing note reviewed.  Constitutional:      Appearance: She is well-developed. She is not diaphoretic.     Comments: Appears older than stated age  HENT:     Head: Normocephalic and atraumatic.     Right Ear: External ear normal.     Left Ear: External ear normal.  Eyes:     General: No scleral icterus.       Right eye: No discharge.        Left eye: No discharge.     Conjunctiva/sclera: Conjunctivae normal.  Neck:     Trachea: No tracheal deviation.  Cardiovascular:     Rate and Rhythm: Normal rate and regular rhythm.  Pulmonary:     Effort: Pulmonary effort is normal. No respiratory distress.     Breath sounds: Normal breath sounds. No stridor. No wheezing or rales.  Abdominal:     General: Bowel sounds are normal. There is no distension.     Palpations: Abdomen is soft.     Tenderness: There is no abdominal tenderness. There is no guarding or rebound.  Musculoskeletal:        General: Tenderness present. No deformity.     Right shoulder: Swelling, tenderness and bony tenderness present.     Cervical back: Neck supple.     Comments: Bruising noted right upper arm  Skin:    General: Skin is warm and dry.     Findings: No rash.  Neurological:     General: No focal deficit present.     Mental Status: She is alert.     Cranial Nerves: No cranial nerve deficit (no facial droop, extraocular movements intact, no slurred speech).     Sensory: No sensory deficit.     Motor: Tremor present. No abnormal muscle tone or seizure activity.     Coordination: Coordination normal.  Psychiatric:        Mood and  Affect: Mood normal.    ED Results / Procedures / Treatments   Labs (all labs ordered  are listed, but only abnormal results are displayed) Labs Reviewed  CBC WITH DIFFERENTIAL/PLATELET - Abnormal; Notable for the following components:      Result Value   RBC 3.24 (*)    HCT 32.6 (*)    MCV 100.6 (*)    MCH 37.0 (*)    MCHC 36.8 (*)    RDW 11.3 (*)    All other components within normal limits  COMPREHENSIVE METABOLIC PANEL - Abnormal; Notable for the following components:   Sodium 120 (*)    Chloride 83 (*)    BUN 6 (*)    AST 65 (*)    Total Bilirubin 1.4 (*)    All other components within normal limits  ETHANOL - Abnormal; Notable for the following components:   Alcohol, Ethyl (B) 172 (*)    All other components within normal limits  AMMONIA    EKG EKG Interpretation  Date/Time:  Monday February 21 2021 18:36:07 EDT Ventricular Rate:  76 PR Interval:  156 QRS Duration: 70 QT Interval:  384 QTC Calculation: 432 R Axis:   19 Text Interpretation: Normal sinus rhythm Septal infarct , age undetermined Abnormal ECG No significant change since last tracing Confirmed by Dorie Rank [phone]) on 02/21/2021 6:55:55 PM  Radiology CT HEAD WO CONTRAST (5MM)  Result Date: 02/21/2021 CLINICAL DATA:  Head trauma, moderate severity, numerous falls over the past few months EXAM: CT HEAD WITHOUT CONTRAST TECHNIQUE: Contiguous axial images were obtained from the base of the skull through the vertex without intravenous contrast. COMPARISON:  06/18/2015 FINDINGS: Brain: No evidence of acute infarction, hemorrhage, extra-axial collection, or mass effect. Ventriculomegaly commensurate with the degree of cerebral atrophy. Generalized cerebral atrophy. Periventricular white matter low attenuation likely secondary to microangiopathy. Vascular: Cerebrovascular atherosclerotic calcifications are noted. Skull: Negative for fracture or focal lesion. Sinuses/Orbits: Visualized portions of the orbits are  unremarkable. Visualized portions of the paranasal sinuses are unremarkable. Visualized portions of the mastoid air cells are unremarkable. Other: None. IMPRESSION: 1. No acute intracranial pathology. 2. Chronic microvascular disease and cerebral atrophy. Electronically Signed   By: Kathreen Devoid M.D.   On: 02/21/2021 18:07    Procedures Procedures   Medications Ordered in ED Medications  sodium chloride 0.9 % bolus 1,000 mL (has no administration in time range)    Followed by  0.9 %  sodium chloride infusion (has no administration in time range)  thiamine (B-1) injection 100 mg (has no administration in time range)  folic acid 1 mg in sodium chloride 0.9 % 50 mL IVPB (has no administration in time range)    ED Course  I have reviewed the triage vital signs and the nursing notes.  Pertinent labs & imaging results that were available during my care of the patient were reviewed by me and considered in my medical decision making (see chart for details).  Clinical Course as of 02/21/21 1942  Mon Feb 21, 2021  1926 CBC is normal. [JK]  [id number] Metabolic panel shows hyponatremia.  Previous labs showed that this is not a new finding.  Patient's alcohol level is elevated [JK]  1938 Discussed findings with patient.  She is upset about being in the hallway.  I apologized and explained that there were no beds but at least I was able to evaluate her.  Pt does not want to stay.  She has not had her shoulder xray yet.  I explained to her that her alcohol level is elevated and likely is causing her balance problems [JK]  Clinical Course User Index [JK] Dorie Rank, MD   MDM Rules/Calculators/A&P                           Patient presented to the ED for evaluation of falls at home.  Patient initially did not know why she was falling.  She told me she had not had anything to drink since yesterday.  Patient clearly was drinking today.  Her alcohol level was elevated.  She appears to be intoxicated and  that likely contributed to her falls.  Signs of serious injury on head CT.  Her sodium level is low but this appears to be chronic although slightly worsened.  I suspect this is related to her alcohol consumption.  Patient is otherwise hemodynamically stable.  She is alert and oriented.  Patient is frustrated about being evaluated in the hallway.  I apologized that there is no other place for her to be seen at this time.  Patient's son is with her.  Patient does not want to stay to complete her evaluation.  She did not get her shoulder x-rays.  I plan to give her IV fluids as well as thiamine and folic acid.  Patient will be leaving AGAINST MEDICAL ADVICE.  Her son is with her and will be taking her home. Final Clinical Impression(s) / ED Diagnoses Final diagnoses:  Hyponatremia  Alcoholic intoxication with complication [facility])    Rx / DC Orders ED Discharge Orders     None        Dorie Rank, MD 02/21/21 1944

## 2021-02-22 ENCOUNTER — Ambulatory Visit: Payer: BC Managed Care – PPO | Admitting: Family Medicine

## 2021-02-23 ENCOUNTER — Other Ambulatory Visit: Payer: Self-pay | Admitting: Family Medicine

## 2021-02-23 DIAGNOSIS — I10 Essential (primary) hypertension: Secondary | ICD-10-CM

## 2021-03-09 ENCOUNTER — Other Ambulatory Visit: Payer: Self-pay | Admitting: Family Medicine

## 2021-03-09 ENCOUNTER — Telehealth: Payer: Self-pay | Admitting: Family Medicine

## 2021-03-09 DIAGNOSIS — K7030 Alcoholic cirrhosis of liver without ascites: Secondary | ICD-10-CM

## 2021-03-09 NOTE — Telephone Encounter (Signed)
Pt. [name] is calling on behalf of Pt. Pt is requesting that she start receiving some home health assistance. It has become hard for her to do basic life task and was wanting to discuss receiving assistance.  Callback #: [phone] or Pt.

## 2021-03-10 ENCOUNTER — Telehealth: Payer: Self-pay | Admitting: *Deleted

## 2021-03-10 ENCOUNTER — Other Ambulatory Visit: Payer: Self-pay | Admitting: Family Medicine

## 2021-03-10 DIAGNOSIS — F1010 Alcohol abuse, uncomplicated: Secondary | ICD-10-CM

## 2021-03-10 DIAGNOSIS — F322 Major depressive disorder, single episode, severe without psychotic features: Secondary | ICD-10-CM

## 2021-03-10 NOTE — Chronic Care Management (AMB) (Signed)
  Care Management   Outreach Note  03/10/2021 Name: [name] MRN: [id number] DOB: 05-21-1956  Referred by: Ann Held, DO Reason for referral : Care Coordination (Initial outreach to schedule referral with Licensed Clinical SW and Pharm D )   An unsuccessful telephone outreach was attempted today. The patient was referred to the case management team for assistance with care management and care coordination.   Follow Up Plan:  A HIPAA compliant phone message was left for the patient providing contact information and requesting a return call.  If patient returns call to provider office, please advise to call Embedded Care Management Care Guide Wendelyn Kiesling at [facility]  Direct Dial: [phone]

## 2021-03-10 NOTE — Telephone Encounter (Signed)
Pt coming in next week to discuss concerns

## 2021-03-10 NOTE — Telephone Encounter (Signed)
[name], she states patient needs help with daily living activities bathing, changing sheets, clothes, checking mail,etc. [name] states she works for a facility in [employer] through [facility]. They offer Home health aids that come out approx 2 hours at a time to assist with patients. However she is not sure if they would come out to her house. [name] is coming Thursday to discuss disability-possibly being admitted to facility. Patient is falling often, unable to get up stairs. She is not often walking at all. Her shower is upstairs and is unable to go upstairs.   She states alcohol is becoming a problem. She will fill up a mountain dew bottle with wine and start drinking at 8 am. She is not sure if her body just will not work with her brain due to alcohol or not. Using the bathroom on herself and was unable to change sheets.She has previously had 2 rounds of PT but no improvement. She declines treatment facility due to bad experience in past. Patient doesn't remember to do exercises she was given in PT previously.   She is bringing [name] in next week to discuss possible treatment and disability.

## 2021-03-15 ENCOUNTER — Ambulatory Visit (HOSPITAL_COMMUNITY): Admission: RE | Admit: 2021-03-15 | Payer: BC Managed Care – PPO | Source: Ambulatory Visit

## 2021-03-16 NOTE — Chronic Care Management (AMB) (Signed)
[name] declined to schedule referral with Pharm D says she doesn't need pharm D services at this time, already scheduled referral with Licensed Clinical SW

## 2021-03-16 NOTE — Chronic Care Management (AMB) (Signed)
  Care Management   Note  03/16/2021 Name: [name] MRN: [id number] DOB: May 26, 1956  [name] is a 65 y.o. year old female who is a primary care patient of Ann Held, DO. I reached out to [name] by phone today in response to a referral sent by [name]'s health plan.    [name] was given information about care management services today including:  Care management services include personalized support from designated clinical staff supervised by her physician, including individualized plan of care and coordination with other care providers 24/7 contact phone numbers for assistance for urgent and routine care needs. The patient may stop care management services at any time by phone call to the office staff.  Patient agreed to services and verbal consent obtained.   Follow up plan: Telephone appointment with care management team member scheduled for: [id number] with Licensed Clinical SW   LM to schedule with Michael Boston, [facility]  Direct Dial: [phone]

## 2021-03-18 ENCOUNTER — Encounter: Payer: Self-pay | Admitting: Family Medicine

## 2021-03-18 ENCOUNTER — Ambulatory Visit: Payer: BC Managed Care – PPO | Admitting: Family Medicine

## 2021-03-18 ENCOUNTER — Other Ambulatory Visit: Payer: Self-pay

## 2021-03-18 ENCOUNTER — Ambulatory Visit (HOSPITAL_BASED_OUTPATIENT_CLINIC_OR_DEPARTMENT_OTHER)
Admission: RE | Admit: 2021-03-18 | Discharge: 2021-03-18 | Disposition: A | Discharge status: Home / Self Care | Payer: BC Managed Care – PPO | Source: Ambulatory Visit | Attending: Gastroenterology | Admitting: Gastroenterology

## 2021-03-18 VITALS — BP 142/68 | HR 67 | Temp 98.2°F | Resp 18 | Ht 60.0 in | Wt 114.2 lb

## 2021-03-18 DIAGNOSIS — F1011 Alcohol abuse, in remission: Secondary | ICD-10-CM | POA: Diagnosis not present

## 2021-03-18 DIAGNOSIS — Z23 Encounter for immunization: Secondary | ICD-10-CM | POA: Diagnosis not present

## 2021-03-18 DIAGNOSIS — K7030 Alcoholic cirrhosis of liver without ascites: Secondary | ICD-10-CM

## 2021-03-18 DIAGNOSIS — I10 Essential (primary) hypertension: Secondary | ICD-10-CM | POA: Diagnosis not present

## 2021-03-18 DIAGNOSIS — F418 Other specified anxiety disorders: Secondary | ICD-10-CM | POA: Diagnosis not present

## 2021-03-18 DIAGNOSIS — K760 Fatty (change of) liver, not elsewhere classified: Secondary | ICD-10-CM | POA: Diagnosis not present

## 2021-03-18 LAB — LIPID PANEL
Cholesterol: 233 mg/dL — ABNORMAL HIGH (ref 0–200)
HDL: 81.7 mg/dL (ref >39.00)
LDL Cholesterol: 127 mg/dL — ABNORMAL HIGH (ref 0–99)
NonHDL: 151.29
Total CHOL/HDL Ratio: 3
Triglycerides: 119 mg/dL (ref 0.0–149.0)
VLDL: 23.8 mg/dL (ref 0.0–40.0)

## 2021-03-18 LAB — CBC WITH DIFFERENTIAL/PLATELET
Basophils Absolute: 0.1 10*3/uL (ref 0.0–0.1)
Basophils Relative: 1 % (ref 0.0–3.0)
Eosinophils Absolute: 0.1 10*3/uL (ref 0.0–0.7)
Eosinophils Relative: 1.9 % (ref 0.0–5.0)
HCT: 39.5 % (ref 36.0–46.0)
Hemoglobin: 13.5 g/dL (ref 12.0–15.0)
Lymphocytes Relative: 25.6 % (ref 12.0–46.0)
Lymphs Abs: 1.4 10*3/uL (ref 0.7–4.0)
MCHC: 34.2 g/dL (ref 30.0–36.0)
MCV: 111 fl — ABNORMAL HIGH (ref 78.0–100.0)
Monocytes Absolute: 0.7 10*3/uL (ref 0.1–1.0)
Monocytes Relative: 12.2 % — ABNORMAL HIGH (ref 3.0–12.0)
Neutro Abs: 3.3 10*3/uL (ref 1.4–7.7)
Neutrophils Relative %: 59.3 % (ref 43.0–77.0)
Platelets: 339 10*3/uL (ref 150.0–400.0)
RBC: 3.56 Mil/uL — ABNORMAL LOW (ref 3.87–5.11)
RDW: 13.7 % (ref 11.5–15.5)
WBC: 5.6 10*3/uL (ref 4.0–10.5)

## 2021-03-18 LAB — COMPREHENSIVE METABOLIC PANEL
ALT: 21 U/L (ref 0–35)
AST: 30 U/L (ref 0–37)
Albumin: 4.4 g/dL (ref 3.5–5.2)
Alkaline Phosphatase: 98 U/L (ref 39–117)
BUN: 6 mg/dL (ref 6–23)
CO2: 26 mEq/L (ref 19–32)
Calcium: 10 mg/dL (ref 8.4–10.5)
Chloride: 93 mEq/L — ABNORMAL LOW (ref 96–112)
Creatinine, Ser: 0.67 mg/dL (ref 0.40–1.20)
GFR: 92.51 mL/min (ref >60.00)
Glucose, Bld: 69 mg/dL — ABNORMAL LOW (ref 70–99)
Potassium: 4.7 mEq/L (ref 3.5–5.1)
Sodium: 131 mEq/L — ABNORMAL LOW (ref 135–145)
Total Bilirubin: 0.7 mg/dL (ref 0.2–1.2)
Total Protein: 7.1 g/dL (ref 6.0–8.3)

## 2021-03-18 LAB — AMMONIA: Ammonia: 22 umol/L (ref 11–35)

## 2021-03-18 LAB — TSH: TSH: 1.75 u[IU]/mL (ref 0.35–5.50)

## 2021-03-18 NOTE — Assessment & Plan Note (Signed)
Discussed [facility] with the pt --- her friend is taking her there when they leave here We discussed the importance of her getting off the alcohol and treating her anxiety/ depression  She is interested in disability as well---  SW referral placed as well

## 2021-03-18 NOTE — Progress Notes (Signed)
Subjective:   By signing my name below, I, Zite Okoli, attest that this documentation has been prepared under the direction and in the presence of Ann Held, DO. 03/18/2021    Patient ID: [name], female    DOB: 1956/03/04, 65 y.o.   MRN: [id number]  Chief Complaint  Patient presents with   Disability    Concerns/ questions:      HPI Patient is in today for an office visit. She is accompanied by her friend today.  She reports she has had trouble walking and would like to see if she qualifies for disability. Her friend reports that she has trouble climbing the stairs and has not taken a shower in a  month.  She reports she has been looking for 1-story houses recently but has a hard time because she has no form of transportation.  She mentions she has reduced her drinking but has not completely stopped. She is interested in going to a rehab program but is limited by her movement.   Her friend also reports she has not been eating properly recently.  She is interested in getting the flu vaccine today.  Past Medical History:  Diagnosis Date   Anxiety    Cancer ([place]) 04/2014   squamous cell carcinoma L inner thigh   Depression    Hard of hearing    Hypertension    Osteopenia    Substance abuse ([place])    TIA (transient ischemic attack) 2016    Past Surgical History:  Procedure Laterality Date   AUGMENTATION MAMMAPLASTY Bilateral 1988   BREAST ENHANCEMENT SURGERY     BUNIONECTOMY     GUM SURGERY  11/2017   NOSE SURGERY     Epistaxis   TONSILLECTOMY      Family History  Problem Relation Age of Onset   Hypertension Mother    Kidney disease Mother        dialysis   Alzheimer's disease Mother    Hyperlipidemia Father    Stroke Paternal Grandmother    Rheum arthritis Paternal Grandmother    Breast cancer Neg Hx    Colon cancer Neg Hx    Esophageal cancer Neg Hx    Rectal cancer Neg Hx    Stomach cancer Neg Hx     Social History    Socioeconomic History   Marital status: Divorced    Spouse name: Not on file   Number of children: Not on file   Years of education: Not on file   Highest education level: Not on file  Occupational History   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  Tobacco Use   Smoking status: Never   Smokeless tobacco: Never  Vaping Use   Vaping Use: Never used  Substance and Sexual Activity   Alcohol use: Yes    Alcohol/week: 0.0 standard drinks    Comment: 3 times a week, states hx of heavy use 15 years ago   Drug use: No   Sexual activity: Not Currently    Partners: Male  Other Topics Concern   Not on file  Social History Narrative   Exercise--- gym 6x a week   Social Determinants of Health   Financial Resource Strain: Not on file  Food Insecurity: Not on file  Transportation Needs: Not on file  Physical Activity: Not on file  Stress: Not on file  Social Connections: Not on file  Intimate Partner Violence: Not on file    Outpatient Medications Prior to Visit  Medication Sig Dispense Refill   alendronate (FOSAMAX) 70 MG tablet Take with a full glass of water on an empty stomach. 12 tablet 3   amoxicillin (AMOXIL) 500 MG tablet Take 500 mg by mouth 3 (three) times daily.     chlorhexidine (PERIDEX) 0.12 % solution SMARTSIG:By Mouth     Coenzyme Q10 (COQ10) 100 MG CAPS Take 1 tablet by mouth daily.      Cyanocobalamin (VITAMIN B-12 PO) Take 1 tablet by mouth daily.     lisinopril (ZESTRIL) 40 MG tablet TAKE 1 TABLET BY MOUTH EVERY DAY 90 tablet 1   metoprolol succinate (TOPROL-XL) 100 MG 24 hr tablet Take 1 tablet (100 mg total) by mouth daily. Take with or immediately following a meal. 90 tablet 1   mirabegron ER (MYRBETRIQ) 50 MG TB24 tablet Take 1 tablet (50 mg total) by mouth daily. 30 tablet 3   Multiple Vitamin (MULTIVITAMIN) capsule Take by mouth.     thiamine 250 MG tablet Take 250 mg by mouth daily.     No facility-administered medications prior to visit.     Allergies  Allergen Reactions   Codeine Other (See Comments)    REACTION: insomnia and anxious    Review of Systems  Constitutional:  Positive for weight loss.  Neurological:  Positive for weakness.      Objective:    Physical Exam Constitutional:      General: She is not in acute distress.    Appearance: Normal appearance. She is not ill-appearing.  HENT:     Head: Normocephalic and atraumatic.     Right Ear: External ear normal.     Left Ear: External ear normal.     Ears:     Comments: Hard of hearing Eyes:     Extraocular Movements: Extraocular movements intact.     Pupils: Pupils are equal, round, and reactive to light.  Cardiovascular:     Rate and Rhythm: Normal rate and regular rhythm.     Pulses: Normal pulses.     Heart sounds: Normal heart sounds. No murmur heard.   No gallop.  Pulmonary:     Effort: Pulmonary effort is normal. No respiratory distress.     Breath sounds: Normal breath sounds. No wheezing, rhonchi or rales.  Abdominal:     General: Bowel sounds are normal. There is no distension.     Palpations: Abdomen is soft. There is no mass.     Tenderness: There is no abdominal tenderness. There is no guarding or rebound.     Hernia: No hernia is present.  Musculoskeletal:     Cervical back: Normal range of motion and neck supple.  Lymphadenopathy:     Cervical: No cervical adenopathy.  Skin:    General: Skin is warm and dry.  Neurological:     Mental Status: She is alert and oriented to person, place, and time.  Psychiatric:        Behavior: Behavior normal.    BP (!) 142/68 (BP Location: Left Arm, Patient Position: Sitting, Cuff Size: Normal)   Pulse 67   Temp 98.2 F (36.8 C) (Oral)   Resp 18   Ht 5' (1.524 m)   Wt 114 lb 3.2 oz (51.8 kg)   SpO2 100%   BMI 22.30 kg/m  Wt Readings from Last 3 Encounters:  03/18/21 114 lb 3.2 oz (51.8 kg)  12/27/20 112 lb (50.8 kg)  09/15/20 111 lb 2 oz (50.4 kg)    Diabetic Foot Exam - Simple  No data filed    Lab Results  Component Value Date   WBC 7.3 02/21/2021   HGB 12.0 02/21/2021   HCT 32.6 (L) 02/21/2021   PLT 314 02/21/2021   GLUCOSE 91 02/21/2021   CHOL 254 (H) 06/28/2020   TRIG 142.0 06/28/2020   HDL 99.40 06/28/2020   LDLDIRECT 127.6 03/28/2013   LDLCALC 126 (H) 06/28/2020   ALT 37 02/21/2021   AST 65 (H) 02/21/2021   NA 120 (L) 02/21/2021   K 4.7 02/21/2021   CL 83 (L) 02/21/2021   CREATININE 0.64 02/21/2021   BUN 6 (L) 02/21/2021   CO2 23 02/21/2021   TSH 0.78 06/28/2020   INR 1.0 09/15/2020   HGBA1C 5.2 06/14/2015   MICROALBUR 1.0 05/29/2014    Lab Results  Component Value Date   TSH 0.78 06/28/2020   Lab Results  Component Value Date   WBC 7.3 02/21/2021   HGB 12.0 02/21/2021   HCT 32.6 (L) 02/21/2021   MCV 100.6 (H) 02/21/2021   PLT 314 02/21/2021   Lab Results  Component Value Date   NA 120 (L) 02/21/2021   K 4.7 02/21/2021   CO2 23 02/21/2021   GLUCOSE 91 02/21/2021   BUN 6 (L) 02/21/2021   CREATININE 0.64 02/21/2021   BILITOT 1.4 (H) 02/21/2021   ALKPHOS 80 02/21/2021   AST 65 (H) 02/21/2021   ALT 37 02/21/2021   PROT 7.5 02/21/2021   ALBUMIN 4.7 02/21/2021   CALCIUM 9.1 02/21/2021   ANIONGAP 14 02/21/2021   GFR 92.51 09/15/2020   Lab Results  Component Value Date   CHOL 254 (H) 06/28/2020   Lab Results  Component Value Date   HDL 99.40 06/28/2020   Lab Results  Component Value Date   LDLCALC 126 (H) 06/28/2020   Lab Results  Component Value Date   TRIG 142.0 06/28/2020   Lab Results  Component Value Date   CHOLHDL 3 06/28/2020   Lab Results  Component Value Date   HGBA1C 5.2 06/14/2015       Assessment & Plan:   Problem List Items Addressed This Visit       Unprioritized   Alcoholic cirrhosis ([place]) - Primary   Relevant Orders   Lipid panel   CBC with Differential/Platelet   Comprehensive metabolic panel   TSH   Ammonia   Ethanol   AMB Referral to [facility]   Other  Visit Diagnoses     Depression with anxiety       Relevant Orders   Lipid panel   CBC with Differential/Platelet   Comprehensive metabolic panel   TSH   Ammonia   Ethanol   AMB Referral to Community Care Coordinaton   Need for influenza vaccination       Relevant Orders   Flu Vaccine QUAD High Dose(Fluad) (Completed)       No orders of the defined types were placed in this encounter.   I,Zite Okoli,acting as a Education administrator for [employer], DO.,have documented all relevant documentation on the behalf of Ann Held, DO,as directed by  Ann Held, DO while in the presence of Ann Held, [place], DO., personally preformed the services described in this documentation.  All medical record entries made by the scribe were at my direction and in my presence.  I have reviewed the chart and discharge instructions (if applicable) and agree that the record reflects my personal performance and  is accurate and complete. 03/18/2021

## 2021-03-18 NOTE — Patient Instructions (Signed)
Alcohol Abuse and Dependence Information, Adult Alcohol is a widely available drug. People drink alcohol in different amounts. People who drink alcohol very often and in large amounts often have problems during and after drinking. They may develop what is called an alcohol use disorder. There are two main types of alcohol use disorders: Alcohol abuse. This is when you use alcohol too much or too often. You may use alcohol to make yourself feel happy or to reduce stress. You may have a hard time setting a limit on the amount you drink. Alcohol dependence. This is when you use alcohol consistently for a period of time, and your body changes as a result. This can make it hard to stop drinking because you may start to feel sick or feel different when you do not use alcohol. These symptoms are known as withdrawal. How can alcohol abuse and dependence affect me? Alcohol abuse and dependence can have a negative effect on your life. Drinking too much can lead to addiction. You may feel like you need alcohol to function normally. You may drink alcohol before work in the morning, during the day, or as soon as you get home from work in the evening. These actions can result in: Poor work performance. Job loss. Financial problems. Car crashes or criminal charges from driving after drinking alcohol. Problems in your relationships with friends and family. Losing the trust and respect of coworkers, friends, and family. Drinking heavily over a long period of time can permanently damage your body and brain, and can cause lifelong health issues, such as: Damage to your liver or pancreas. Heart problems, high blood pressure, or stroke. Certain cancers. Decreased ability to fight infections. Brain or nerve damage. Depression. Early (premature) death. If you are careless or you crave alcohol, it is easy to drink more than your body can handle (overdose). Alcohol overdose is a serious situation that requires  hospitalization. It may lead to permanent injuries or death. What can increase my risk? Having a family history of alcohol abuse. Having depression or other mental health conditions. Beginning to drink at an early age. Binge drinking often. Experiencing trauma, stress, and an unstable home life during childhood. Spending time with people who drink often. What actions can I take to prevent or manage alcohol abuse and dependence? Do not drink alcohol if: Your health care provider tells you not to drink. You are pregnant, may be pregnant, or are planning to become pregnant. If you drink alcohol: Limit how much you use to: 0-1 drink a day for women. 0-2 drinks a day for men. Be aware of how much alcohol is in your drink. In the [place] one drink equals one 12 oz bottle of beer (355 mL), one 5 oz glass of wine (148 mL), or one 1 oz glass of hard liquor (44 mL). Stop drinking if you have been drinking too much. This can be very hard to do if you are used to abusing alcohol. If you begin to have withdrawal symptoms, talk with your health care provider or a person that you trust. These symptoms may include anxiety, shaky hands, headache, nausea, sweating, or not being able to sleep. Choose to drink nonalcoholic beverages in social gatherings and places where there may be alcohol. Activity Spend more time on activities that you enjoy that do not involve alcohol, like hobbies or exercise. Find healthy ways to cope with stress, such as exercise, meditation, or spending time with people you care about. General information Talk to your family,   coworkers, and friends about supporting you in your efforts to stop drinking. If they drink, ask them not to drink around you. Spend more time with people who do not drink alcohol. If you think that you have an alcohol dependency problem: Tell friends or family about your concerns. Talk with your health care provider or another health professional about where to  get help. Work with a therapist and a chemical dependency counselor. Consider joining a support group for people who struggle with alcohol abuse and dependence. Where to find support  Your health care provider. SMART Recovery: www.smartrecovery.org Therapy and support groups Local treatment centers or chemical dependency counselors. Local AA groups in your community: www.aa.org Where to find more information Centers for Disease Control and Prevention: www.cdc.gov National Institute on Alcohol Abuse and Alcoholism: www.niaaa.nih.gov Alcoholics Anonymous (AA): www.aa.org Contact a health care provider if: You drank more or for longer than you intended on more than one occasion. You tried to stop drinking or to cut back on how much you drink, but you were not able to. You often drink to the point of vomiting or passing out. You want to drink so badly that you cannot think about anything else. You have problems in your life due to drinking, but you continue to drink. You keep drinking even though you feel anxious, depressed, or have experienced memory loss. You have stopped doing the things you used to enjoy in order to drink. You have to drink more than you used to in order to get the effect you want. You experience anxiety, sweating, nausea, shakiness, and trouble sleeping when you try to stop drinking. Get help right away if: You have thoughts about hurting yourself or others. You have serious withdrawal symptoms, including: Confusion. Racing heart. High blood pressure. Fever. If you ever feel like you may hurt yourself or others, or have thoughts about taking your own life, get help right away. You can go to your nearest emergency department or call: Your local emergency services (911 in the [place] A suicide crisis helpline, such as the National Suicide Prevention Lifeline at 1-800-273-8255. This is open 24 hours a day. Summary Alcohol abuse and dependence can have a negative  effect on your life. Drinking too much or too often can lead to addiction. If you drink alcohol, limit how much you use. If you are having trouble keeping your drinking under control, find ways to change your behavior. Hobbies, calming activities, exercise, or support groups can help. If you feel you need help with changing your drinking habits, talk with your health care provider, a good friend, or a therapist, or go to an AA group. This information is not intended to replace advice given to you by your health care provider. Make sure you discuss any questions you have with your health care provider. Document Revised: 10/15/2018 Document Reviewed: 09/03/2018 Elsevier Patient Education  2022 Elsevier Inc.  

## 2021-03-18 NOTE — Assessment & Plan Note (Signed)
Well controlled, no changes to meds. Encouraged heart healthy diet such as the DASH diet and exercise as tolerated.  °

## 2021-03-21 ENCOUNTER — Other Ambulatory Visit: Payer: Self-pay

## 2021-03-21 ENCOUNTER — Inpatient Hospital Stay (HOSPITAL_COMMUNITY)
Admission: EM | Admit: 2021-03-21 | Discharge: 2021-04-08 | DRG: 897 | Disposition: A | Discharge status: Skilled Nursing Facility | Payer: BC Managed Care – PPO | Attending: Internal Medicine | Admitting: Internal Medicine

## 2021-03-21 DIAGNOSIS — R269 Unspecified abnormalities of gait and mobility: Secondary | ICD-10-CM

## 2021-03-21 DIAGNOSIS — Z820 Family history of epilepsy and other diseases of the nervous system: Secondary | ICD-10-CM

## 2021-03-21 DIAGNOSIS — R9431 Abnormal electrocardiogram [ECG] [EKG]: Secondary | ICD-10-CM | POA: Diagnosis not present

## 2021-03-21 DIAGNOSIS — K7030 Alcoholic cirrhosis of liver without ascites: Secondary | ICD-10-CM | POA: Diagnosis present

## 2021-03-21 DIAGNOSIS — Z79899 Other long term (current) drug therapy: Secondary | ICD-10-CM | POA: Diagnosis not present

## 2021-03-21 DIAGNOSIS — M8580 Other specified disorders of bone density and structure, unspecified site: Secondary | ICD-10-CM | POA: Diagnosis present

## 2021-03-21 DIAGNOSIS — Z885 Allergy status to narcotic agent status: Secondary | ICD-10-CM

## 2021-03-21 DIAGNOSIS — Z8249 Family history of ischemic heart disease and other diseases of the circulatory system: Secondary | ICD-10-CM

## 2021-03-21 DIAGNOSIS — Z841 Family history of disorders of kidney and ureter: Secondary | ICD-10-CM | POA: Diagnosis not present

## 2021-03-21 DIAGNOSIS — R64 Cachexia: Secondary | ICD-10-CM | POA: Diagnosis not present

## 2021-03-21 DIAGNOSIS — Z7983 Long term (current) use of bisphosphonates: Secondary | ICD-10-CM

## 2021-03-21 DIAGNOSIS — F32A Depression, unspecified: Secondary | ICD-10-CM | POA: Diagnosis present

## 2021-03-21 DIAGNOSIS — E876 Hypokalemia: Secondary | ICD-10-CM | POA: Diagnosis not present

## 2021-03-21 DIAGNOSIS — Z823 Family history of stroke: Secondary | ICD-10-CM

## 2021-03-21 DIAGNOSIS — R270 Ataxia, unspecified: Secondary | ICD-10-CM | POA: Diagnosis not present

## 2021-03-21 DIAGNOSIS — R339 Retention of urine, unspecified: Secondary | ICD-10-CM | POA: Diagnosis not present

## 2021-03-21 DIAGNOSIS — F10239 Alcohol dependence with withdrawal, unspecified: Secondary | ICD-10-CM | POA: Diagnosis present

## 2021-03-21 DIAGNOSIS — Z8673 Personal history of transient ischemic attack (TIA), and cerebral infarction without residual deficits: Secondary | ICD-10-CM | POA: Diagnosis not present

## 2021-03-21 DIAGNOSIS — Z83438 Family history of other disorder of lipoprotein metabolism and other lipidemia: Secondary | ICD-10-CM

## 2021-03-21 DIAGNOSIS — E871 Hypo-osmolality and hyponatremia: Secondary | ICD-10-CM | POA: Diagnosis not present

## 2021-03-21 DIAGNOSIS — Z7401 Bed confinement status: Secondary | ICD-10-CM | POA: Diagnosis not present

## 2021-03-21 DIAGNOSIS — F10231 Alcohol dependence with withdrawal delirium: Principal | ICD-10-CM | POA: Diagnosis present

## 2021-03-21 DIAGNOSIS — E869 Volume depletion, unspecified: Secondary | ICD-10-CM | POA: Diagnosis not present

## 2021-03-21 DIAGNOSIS — R296 Repeated falls: Secondary | ICD-10-CM | POA: Diagnosis present

## 2021-03-21 DIAGNOSIS — F1010 Alcohol abuse, uncomplicated: Secondary | ICD-10-CM

## 2021-03-21 DIAGNOSIS — F419 Anxiety disorder, unspecified: Secondary | ICD-10-CM | POA: Diagnosis present

## 2021-03-21 DIAGNOSIS — R5381 Other malaise: Secondary | ICD-10-CM | POA: Diagnosis not present

## 2021-03-21 DIAGNOSIS — Z6820 Body mass index (BMI) 20.0-20.9, adult: Secondary | ICD-10-CM

## 2021-03-21 DIAGNOSIS — R260 Ataxic gait: Secondary | ICD-10-CM | POA: Diagnosis not present

## 2021-03-21 DIAGNOSIS — F10939 Alcohol use, unspecified with withdrawal, unspecified: Secondary | ICD-10-CM | POA: Diagnosis present

## 2021-03-21 DIAGNOSIS — Z8261 Family history of arthritis: Secondary | ICD-10-CM | POA: Diagnosis not present

## 2021-03-21 DIAGNOSIS — I10 Essential (primary) hypertension: Secondary | ICD-10-CM | POA: Diagnosis present

## 2021-03-21 DIAGNOSIS — Z20822 Contact with and (suspected) exposure to covid-19: Secondary | ICD-10-CM | POA: Diagnosis present

## 2021-03-21 DIAGNOSIS — E512 Wernicke's encephalopathy: Secondary | ICD-10-CM | POA: Diagnosis present

## 2021-03-21 DIAGNOSIS — R531 Weakness: Secondary | ICD-10-CM | POA: Diagnosis not present

## 2021-03-21 DIAGNOSIS — Z85828 Personal history of other malignant neoplasm of skin: Secondary | ICD-10-CM

## 2021-03-21 DIAGNOSIS — D649 Anemia, unspecified: Secondary | ICD-10-CM | POA: Diagnosis not present

## 2021-03-21 DIAGNOSIS — F10230 Alcohol dependence with withdrawal, uncomplicated: Secondary | ICD-10-CM | POA: Diagnosis not present

## 2021-03-21 LAB — COMPREHENSIVE METABOLIC PANEL
ALT: 23 U/L (ref 0–44)
AST: 38 U/L (ref 15–41)
Albumin: 4.3 g/dL (ref 3.5–5.0)
Alkaline Phosphatase: 89 U/L (ref 38–126)
Anion gap: 14 (ref 5–15)
BUN: <5 mg/dL — ABNORMAL LOW (ref 8–23)
CO2: 25 mmol/L (ref 22–32)
Calcium: 9.1 mg/dL (ref 8.9–10.3)
Chloride: 91 mmol/L — ABNORMAL LOW (ref 98–111)
Creatinine, Ser: 0.66 mg/dL (ref 0.44–1.00)
GFR, Estimated: >60 mL/min (ref >60)
Glucose, Bld: 121 mg/dL — ABNORMAL HIGH (ref 70–99)
Potassium: 3.9 mmol/L (ref 3.5–5.1)
Sodium: 130 mmol/L — ABNORMAL LOW (ref 135–145)
Total Bilirubin: 0.3 mg/dL (ref 0.3–1.2)
Total Protein: 7.2 g/dL (ref 6.5–8.1)

## 2021-03-21 LAB — CBC WITH DIFFERENTIAL/PLATELET
Abs Immature Granulocytes: 0.03 10*3/uL (ref 0.00–0.07)
Basophils Absolute: 0.1 10*3/uL (ref 0.0–0.1)
Basophils Relative: 1 %
Eosinophils Absolute: 0.1 10*3/uL (ref 0.0–0.5)
Eosinophils Relative: 1 %
HCT: 41.2 % (ref 36.0–46.0)
Hemoglobin: 14.2 g/dL (ref 12.0–15.0)
Immature Granulocytes: 1 %
Lymphocytes Relative: 24 %
Lymphs Abs: 1.2 10*3/uL (ref 0.7–4.0)
MCH: 36.9 pg — ABNORMAL HIGH (ref 26.0–34.0)
MCHC: 34.5 g/dL (ref 30.0–36.0)
MCV: 107 fL — ABNORMAL HIGH (ref 80.0–100.0)
Monocytes Absolute: 0.5 10*3/uL (ref 0.1–1.0)
Monocytes Relative: 9 %
Neutro Abs: 3.3 10*3/uL (ref 1.7–7.7)
Neutrophils Relative %: 64 %
Platelets: 358 10*3/uL (ref 150–400)
RBC: 3.85 MIL/uL — ABNORMAL LOW (ref 3.87–5.11)
RDW: 12.6 % (ref 11.5–15.5)
WBC: 5.1 10*3/uL (ref 4.0–10.5)
nRBC: 0 % (ref 0.0–0.2)

## 2021-03-21 LAB — ETHANOL: Alcohol, Ethyl (B): 108 mg/dL — ABNORMAL HIGH (ref <10)

## 2021-03-21 NOTE — ED Provider Notes (Signed)
Emergency Medicine Provider Triage Evaluation Note  [name] , a 65 y.o. female  was evaluated in triage.  Patient comes in for alcohol detox.  She has been drinking since she was 65 years old.  She drinks 5-6 beers a day.  Her last drink was last night at 8 PM.  She went to a rehab facility who told her they could not keep her because she was unable to put her gown on due to tremors.  She denies any chest pain, shortness of breath, confusion, hallucinations, delusions, abdominal pain, nausea, vomiting.  She does not think that she is going to be willing to wait hours and hours to get a room.  I advised her that that she should stay for further evaluation.  There is possibility that she will leave AMA for being seen.   Review of Systems  Positive: Tremors Negative: Chest pain, shortness of breath, confusion, hallucinations, delusions, abdominal pain, nausea, vomiting  Physical Exam  BP (!) 153/95 (BP Location: Right Arm)   Pulse 75   Temp 98.1 F (36.7 C) (Oral)   Resp 17   Ht 5' (1.524 m)   Wt 47.6 kg   SpO2 100%   BMI 20.51 kg/m  Gen:   Awake, no distress.   Resp:  Normal effort.  Lung sounds clear to auscultation. MSK:   Moves extremities without difficulty.  Other:  Upper extremity bilateral tremors noted on exam  Medical Decision Making  Medically screening exam initiated at 3:30 PM.  Appropriate orders placed.  LYNEL LYNES was informed that the remainder of the evaluation will be completed by another provider, this initial triage assessment does not replace that evaluation, and the importance of remaining in the ED until their evaluation is complete.    Adolphus Birchwood, PA-C 03/21/21 [place], DO 03/21/21 1807

## 2021-03-21 NOTE — ED Notes (Signed)
Stanford Breed friend [phone] wants to let someone know about patients visit with her PCP recently as well as more information on the patient

## 2021-03-21 NOTE — ED Triage Notes (Signed)
Pt arrived POV concerned for ETOH withdrawal and addiction  Pt states that her last drink was 8pm  Pt drinks 5-6 beers daily   Pt states that she needs to be placed in rehab or geriatric care for failure to thrive  Denies pain at this time   Denies hx of seizure from ETOH withdrawal

## 2021-03-22 ENCOUNTER — Inpatient Hospital Stay (HOSPITAL_COMMUNITY): Payer: BC Managed Care – PPO

## 2021-03-22 ENCOUNTER — Ambulatory Visit: Payer: BC Managed Care – PPO | Admitting: Gastroenterology

## 2021-03-22 DIAGNOSIS — R260 Ataxic gait: Secondary | ICD-10-CM | POA: Diagnosis present

## 2021-03-22 DIAGNOSIS — E876 Hypokalemia: Secondary | ICD-10-CM | POA: Diagnosis not present

## 2021-03-22 DIAGNOSIS — Z820 Family history of epilepsy and other diseases of the nervous system: Secondary | ICD-10-CM | POA: Diagnosis not present

## 2021-03-22 DIAGNOSIS — R269 Unspecified abnormalities of gait and mobility: Secondary | ICD-10-CM

## 2021-03-22 DIAGNOSIS — Z20822 Contact with and (suspected) exposure to covid-19: Secondary | ICD-10-CM | POA: Diagnosis present

## 2021-03-22 DIAGNOSIS — E869 Volume depletion, unspecified: Secondary | ICD-10-CM | POA: Diagnosis present

## 2021-03-22 DIAGNOSIS — F32A Depression, unspecified: Secondary | ICD-10-CM

## 2021-03-22 DIAGNOSIS — K7030 Alcoholic cirrhosis of liver without ascites: Secondary | ICD-10-CM | POA: Diagnosis present

## 2021-03-22 DIAGNOSIS — Z8673 Personal history of transient ischemic attack (TIA), and cerebral infarction without residual deficits: Secondary | ICD-10-CM | POA: Diagnosis not present

## 2021-03-22 DIAGNOSIS — M8580 Other specified disorders of bone density and structure, unspecified site: Secondary | ICD-10-CM | POA: Diagnosis present

## 2021-03-22 DIAGNOSIS — F1010 Alcohol abuse, uncomplicated: Secondary | ICD-10-CM | POA: Diagnosis not present

## 2021-03-22 DIAGNOSIS — F10231 Alcohol dependence with withdrawal delirium: Secondary | ICD-10-CM | POA: Diagnosis present

## 2021-03-22 DIAGNOSIS — R64 Cachexia: Secondary | ICD-10-CM | POA: Diagnosis present

## 2021-03-22 DIAGNOSIS — E871 Hypo-osmolality and hyponatremia: Secondary | ICD-10-CM

## 2021-03-22 DIAGNOSIS — Z8249 Family history of ischemic heart disease and other diseases of the circulatory system: Secondary | ICD-10-CM | POA: Diagnosis not present

## 2021-03-22 DIAGNOSIS — I10 Essential (primary) hypertension: Secondary | ICD-10-CM

## 2021-03-22 DIAGNOSIS — F10239 Alcohol dependence with withdrawal, unspecified: Secondary | ICD-10-CM | POA: Diagnosis present

## 2021-03-22 DIAGNOSIS — Z8261 Family history of arthritis: Secondary | ICD-10-CM | POA: Diagnosis not present

## 2021-03-22 DIAGNOSIS — R5381 Other malaise: Secondary | ICD-10-CM | POA: Diagnosis present

## 2021-03-22 DIAGNOSIS — Z823 Family history of stroke: Secondary | ICD-10-CM | POA: Diagnosis not present

## 2021-03-22 DIAGNOSIS — R339 Retention of urine, unspecified: Secondary | ICD-10-CM | POA: Diagnosis not present

## 2021-03-22 DIAGNOSIS — F419 Anxiety disorder, unspecified: Secondary | ICD-10-CM | POA: Diagnosis present

## 2021-03-22 DIAGNOSIS — F10230 Alcohol dependence with withdrawal, uncomplicated: Secondary | ICD-10-CM | POA: Diagnosis not present

## 2021-03-22 DIAGNOSIS — R296 Repeated falls: Secondary | ICD-10-CM

## 2021-03-22 DIAGNOSIS — Z83438 Family history of other disorder of lipoprotein metabolism and other lipidemia: Secondary | ICD-10-CM | POA: Diagnosis not present

## 2021-03-22 DIAGNOSIS — Z6820 Body mass index (BMI) 20.0-20.9, adult: Secondary | ICD-10-CM | POA: Diagnosis not present

## 2021-03-22 DIAGNOSIS — E512 Wernicke's encephalopathy: Secondary | ICD-10-CM | POA: Diagnosis present

## 2021-03-22 DIAGNOSIS — Z841 Family history of disorders of kidney and ureter: Secondary | ICD-10-CM | POA: Diagnosis not present

## 2021-03-22 LAB — PROTIME-INR
INR: 1 (ref 0.8–1.2)
Prothrombin Time: 12.8 seconds (ref 11.4–15.2)

## 2021-03-22 LAB — URINALYSIS, ROUTINE W REFLEX MICROSCOPIC
Bilirubin Urine: NEGATIVE
Glucose, UA: NEGATIVE mg/dL
Hgb urine dipstick: NEGATIVE
Ketones, ur: NEGATIVE mg/dL
Leukocytes,Ua: NEGATIVE
Nitrite: NEGATIVE
Protein, ur: NEGATIVE mg/dL
Specific Gravity, Urine: 1.004 — ABNORMAL LOW (ref 1.005–1.030)
pH: 7 (ref 5.0–8.0)

## 2021-03-22 LAB — RAPID URINE DRUG SCREEN, HOSP PERFORMED
Amphetamines: NOT DETECTED
Barbiturates: NOT DETECTED
Benzodiazepines: NOT DETECTED
Cocaine: NOT DETECTED
Opiates: NOT DETECTED
Tetrahydrocannabinol: NOT DETECTED

## 2021-03-22 LAB — PHOSPHORUS: Phosphorus: 3 mg/dL (ref 2.5–4.6)

## 2021-03-22 LAB — AMMONIA: Ammonia: 12 umol/L (ref 9–35)

## 2021-03-22 LAB — VITAMIN B12: Vitamin B-12: 431 pg/mL (ref 180–914)

## 2021-03-22 LAB — HIV ANTIBODY (ROUTINE TESTING W REFLEX): HIV Screen 4th Generation wRfx: NONREACTIVE

## 2021-03-22 LAB — MAGNESIUM: Magnesium: 1.7 mg/dL (ref 1.7–2.4)

## 2021-03-22 MED ORDER — ACETAMINOPHEN 325 MG PO TABS: 650.0000 mg | ORAL_TABLET | Freq: Four times a day (QID) | ORAL | Status: DC | PRN

## 2021-03-22 MED ORDER — HYDRALAZINE HCL 20 MG/ML IJ SOLN
10.0000 mg | INTRAMUSCULAR | Status: DC | PRN
  Administered 2021-03-22: 10 mg via INTRAVENOUS
  Filled 2021-03-22 (×2): qty 1

## 2021-03-22 MED ORDER — LISINOPRIL 40 MG PO TABS
40.0000 mg | ORAL_TABLET | Freq: Every day | ORAL | Status: DC
  Administered 2021-03-23 – 2021-03-27 (×5): 40 mg via ORAL
  Filled 2021-03-22 (×5): qty 1

## 2021-03-22 MED ORDER — LORAZEPAM 2 MG/ML IJ SOLN
0.0000 mg | Freq: Two times a day (BID) | INTRAMUSCULAR | Status: AC
  Filled 2021-03-22: qty 1

## 2021-03-22 MED ORDER — LORAZEPAM 2 MG/ML IJ SOLN
1.0000 mg | INTRAMUSCULAR | Status: AC | PRN
  Administered 2021-03-22: 1 mg via INTRAVENOUS
  Administered 2021-03-23: 2 mg via INTRAVENOUS
  Administered 2021-03-24: 1 mg via INTRAVENOUS
  Filled 2021-03-22 (×2): qty 1

## 2021-03-22 MED ORDER — THIAMINE HCL 100 MG PO TABS: 100.0000 mg | ORAL_TABLET | Freq: Every day | ORAL | Status: DC

## 2021-03-22 MED ORDER — LORAZEPAM 1 MG PO TABS: 0.0000 mg | ORAL_TABLET | Freq: Two times a day (BID) | ORAL | Status: AC

## 2021-03-22 MED ORDER — SODIUM CHLORIDE 0.9% FLUSH
3.0000 mL | Freq: Two times a day (BID) | INTRAVENOUS | Status: DC
  Administered 2021-03-22 – 2021-04-08 (×33): 3 mL via INTRAVENOUS

## 2021-03-22 MED ORDER — FOLIC ACID 1 MG PO TABS
1.0000 mg | ORAL_TABLET | Freq: Every day | ORAL | Status: DC
  Administered 2021-03-22 – 2021-04-08 (×18): 1 mg via ORAL
  Filled 2021-03-22 (×18): qty 1

## 2021-03-22 MED ORDER — LORAZEPAM 2 MG/ML IJ SOLN
0.0000 mg | Freq: Four times a day (QID) | INTRAMUSCULAR | Status: DC
  Administered 2021-03-22 – 2021-03-24 (×4): 1 mg via INTRAVENOUS
  Filled 2021-03-22 (×4): qty 1

## 2021-03-22 MED ORDER — THIAMINE HCL 100 MG PO TABS
500.0000 mg | ORAL_TABLET | ORAL | Status: DC
  Administered 2021-03-23 – 2021-03-30 (×8): 500 mg via ORAL
  Filled 2021-03-22 (×8): qty 5

## 2021-03-22 MED ORDER — ALBUTEROL SULFATE (2.5 MG/3ML) 0.083% IN NEBU: 2.5000 mg | INHALATION_SOLUTION | Freq: Four times a day (QID) | RESPIRATORY_TRACT | Status: DC | PRN

## 2021-03-22 MED ORDER — THIAMINE HCL 100 MG/ML IJ SOLN: 100.0000 mg | Freq: Every day | INTRAMUSCULAR | Status: DC

## 2021-03-22 MED ORDER — THIAMINE HCL 100 MG/ML IJ SOLN: 500.0000 mg | Freq: Once | INTRAMUSCULAR | Status: DC

## 2021-03-22 MED ORDER — METOPROLOL SUCCINATE ER 100 MG PO TB24
100.0000 mg | ORAL_TABLET | Freq: Every day | ORAL | Status: DC
  Administered 2021-03-23 – 2021-03-29 (×7): 100 mg via ORAL
  Filled 2021-03-22 (×8): qty 1

## 2021-03-22 MED ORDER — THIAMINE HCL 100 MG/ML IJ SOLN
500.0000 mg | Freq: Once | INTRAVENOUS | Status: AC
  Administered 2021-03-22: 500 mg via INTRAVENOUS
  Filled 2021-03-22: qty 5

## 2021-03-22 MED ORDER — THIAMINE HCL 250 MG PO TABS: 250.0000 mg | ORAL_TABLET | Freq: Every day | ORAL | 0 refills | Status: DC

## 2021-03-22 MED ORDER — LORAZEPAM 1 MG PO TABS
0.0000 mg | ORAL_TABLET | Freq: Four times a day (QID) | ORAL | Status: DC
  Administered 2021-03-22 (×2): 1 mg via ORAL
  Filled 2021-03-22 (×2): qty 1

## 2021-03-22 MED ORDER — SODIUM CHLORIDE 0.9 % IV BOLUS
1000.0000 mL | Freq: Once | INTRAVENOUS | Status: AC
  Administered 2021-03-22: 1000 mL via INTRAVENOUS

## 2021-03-22 MED ORDER — CHLORDIAZEPOXIDE HCL 25 MG PO CAPS: ORAL_CAPSULE | ORAL | 0 refills | Status: DC

## 2021-03-22 MED ORDER — THIAMINE HCL 100 MG PO TABS: 500.0000 mg | ORAL_TABLET | Freq: Every day | ORAL | Status: DC

## 2021-03-22 MED ORDER — THIAMINE HCL 100 MG/ML IJ SOLN: 100.0000 mg | Freq: Once | INTRAMUSCULAR | Status: DC

## 2021-03-22 MED ORDER — LORAZEPAM 1 MG PO TABS: 1.0000 mg | ORAL_TABLET | ORAL | Status: AC | PRN

## 2021-03-22 MED ORDER — ENOXAPARIN SODIUM 40 MG/0.4ML IJ SOSY
40.0000 mg | PREFILLED_SYRINGE | INTRAMUSCULAR | Status: DC
  Administered 2021-03-22 – 2021-04-08 (×18): 40 mg via SUBCUTANEOUS
  Filled 2021-03-22 (×18): qty 0.4

## 2021-03-22 MED ORDER — ACETAMINOPHEN 650 MG RE SUPP: 650.0000 mg | Freq: Four times a day (QID) | RECTAL | Status: DC | PRN

## 2021-03-22 MED ORDER — THIAMINE HCL 100 MG/ML IJ SOLN
500.0000 mg | INTRAVENOUS | Status: DC
  Filled 2021-03-22 (×8): qty 5

## 2021-03-22 MED ORDER — THIAMINE HCL 100 MG/ML IJ SOLN: 500.0000 mg | Freq: Every day | INTRAMUSCULAR | Status: DC

## 2021-03-22 MED ORDER — ADULT MULTIVITAMIN W/MINERALS CH
1.0000 | ORAL_TABLET | Freq: Every day | ORAL | Status: DC
  Administered 2021-03-22 – 2021-04-08 (×18): 1 via ORAL
  Filled 2021-03-22 (×18): qty 1

## 2021-03-22 NOTE — ED Notes (Signed)
Received HPOA paperwork from pt's friend [name] who is pt's HPOA. Paperwork sent to medical records to be placed in chart. Will continue to monitor. Please Call [name] with any updates on the pt.

## 2021-03-22 NOTE — ED Provider Notes (Addendum)
[facility] EMERGENCY DEPARTMENT Provider Note   CSN: [id number] Arrival date & time: 03/21/21  1308     History Chief Complaint  Patient presents with   Alcohol Problem    [name] is a 65 y.o. female.  HPI     This is a 65 year old female with a history of hypertension, alcohol abuse, anxiety who presents requesting detox.  Patient reports that she drinks daily and has since she was 18.  She mostly drinks beer.  She states she drinks 5-6 beers per day.  She last drank around 7 PM on Sunday.  She reports some tremors.  Denies any physical complaints such as chest pain, shortness of breath, abdominal pain, nausea, vomiting.  Denies headache.  She denies any history of withdrawal seizures.  She reports that she attempted to go to outpatient rehab but was turned away because she was tremulous.  I reviewed the patient's chart.  She was seen several days ago by her primary physician.  She has had increasing issues with her ADLs, increasing falls at home.  Some or all likely related to her ongoing alcohol abuse.  There was a request for home health and social work which appear to have been placed.  Past Medical History:  Diagnosis Date   Anxiety    Cancer ([place]) 04/2014   squamous cell carcinoma L inner thigh   Depression    Hard of hearing    Hypertension    Osteopenia    Substance abuse ([place])    TIA (transient ischemic attack) 2016    Patient Active Problem List   Diagnosis Date Noted   Alcoholic cirrhosis ([place]) [id number]   Ataxia 03/22/2020   Alcohol withdrawal ([place]) 10/09/2015   Nausea, vomiting and diarrhea 10/07/2015   Diarrhea 10/07/2015   Anxiety    Acute ischemic stroke ([place])    Left leg weakness 06/13/2015   TIA (transient ischemic attack) 06/13/2015   Hyponatremia 06/13/2015   Cornea scar 09/24/2012   H/O corneal ulcer 09/24/2012   Bronchitis 06/14/2012   Error, refractive, myopia 02/29/2012   Presbyopia 01/25/2012   H/O ETOH  abuse 09/13/2011   DYSURIA 10/22/2009   PROTEINURIA, MILD 04/21/2009   ABNORMAL FINDINGS GI TRACT 12/03/2007   Essential hypertension 10/22/2007    Past Surgical History:  Procedure Laterality Date   AUGMENTATION MAMMAPLASTY Bilateral 1988   BREAST ENHANCEMENT SURGERY     BUNIONECTOMY     GUM SURGERY  11/2017   NOSE SURGERY     Epistaxis   TONSILLECTOMY       OB History   No obstetric history on file.     Family History  Problem Relation Age of Onset   Hypertension Mother    Kidney disease Mother        dialysis   Alzheimer's disease Mother    Hyperlipidemia Father    Stroke Paternal Grandmother    Rheum arthritis Paternal Grandmother    Breast cancer Neg Hx    Colon cancer Neg Hx    Esophageal cancer Neg Hx    Rectal cancer Neg Hx    Stomach cancer Neg Hx     Social History   Tobacco Use   Smoking status: Never   Smokeless tobacco: Never  Vaping Use   Vaping Use: Never used  Substance Use Topics   Alcohol use: Yes    Alcohol/week: 0.0 standard drinks    Comment: 3 times a week, states hx of heavy use 15 years ago  Drug use: No    Home Medications Prior to Admission medications   Medication Sig Start Date End Date Taking? Authorizing Provider  chlordiazePOXIDE (LIBRIUM) 25 MG capsule '50mg'$  PO TID x 1D, then 25-'50mg'$  PO BID X 1D, then 25-'50mg'$  PO QD X 1D 03/22/21  Yes Keyonna [place]un[place], Barbette Hair, MD  alendronate (FOSAMAX) 70 MG tablet Take with a full glass of water on an empty stomach. 12/27/20   Ann Held, DO  amoxicillin (AMOXIL) 500 MG tablet Take 500 mg by mouth 3 (three) times daily. 07/21/20   [provider]  chlorhexidine (PERIDEX) 0.12 % solution SMARTSIG:By Mouth 09/08/20   [provider]  Coenzyme Q10 (COQ10) 100 MG CAPS Take 1 tablet by mouth daily.     [provider]  Cyanocobalamin (VITAMIN B-12 PO) Take 1 tablet by mouth daily.    [provider]  lisinopril (ZESTRIL) 40 MG tablet TAKE 1 TABLET BY MOUTH  EVERY DAY 02/23/21   Carollee Herter, Alferd Apa, DO  metoprolol succinate (TOPROL-XL) 100 MG 24 hr tablet Take 1 tablet (100 mg total) by mouth daily. Take with or immediately following a meal. 12/27/20   Carollee Herter, Alferd Apa, DO  mirabegron ER (MYRBETRIQ) 50 MG TB24 tablet Take 1 tablet (50 mg total) by mouth daily. 06/28/20   Ann Held, DO  Multiple Vitamin (MULTIVITAMIN) capsule Take by mouth.    [provider]  thiamine 250 MG tablet Take 1 tablet (250 mg total) by mouth daily. 03/22/21   Ramah Langhans, Barbette Hair, MD    Allergies    Codeine  Review of Systems   Review of Systems  Constitutional:  Negative for fever.  Respiratory:  Negative for shortness of breath.   Cardiovascular:  Negative for chest pain.  Gastrointestinal:  Negative for abdominal pain, nausea and vomiting.  Neurological:  Positive for tremors.  All other systems reviewed and are negative.  Physical Exam Updated Vital Signs BP (!) 166/97 (BP Location: Left Arm)   Pulse 72   Temp 98.2 F (36.8 C)   Resp 14   Ht 1.524 m (5')   Wt 47.6 kg   SpO2 100%   BMI 20.51 kg/m   Physical Exam Vitals and nursing note reviewed.  Constitutional:      Appearance: She is well-developed.     Comments: Thin, chronically ill-appearing  HENT:     Head: Normocephalic and atraumatic.     Nose: Nose normal.     Mouth/Throat:     Mouth: Mucous membranes are dry.  Eyes:     Pupils: Pupils are equal, round, and reactive to light.  Cardiovascular:     Rate and Rhythm: Normal rate and regular rhythm.     Heart sounds: Normal heart sounds.  Pulmonary:     Effort: Pulmonary effort is normal. No respiratory distress.     Breath sounds: No wheezing.  Abdominal:     Palpations: Abdomen is soft.     Tenderness: There is no abdominal tenderness.  Musculoskeletal:     Cervical back: Neck supple.     Right lower leg: No edema.     Left lower leg: No edema.  Skin:    General: Skin is warm and dry.  Neurological:      Mental Status: She is alert and oriented to person, place, and time.     Comments: Tremulousness noted  Psychiatric:     Comments: Anxious    ED Results / Procedures / Treatments   Labs (  all labs ordered are listed, but only abnormal results are displayed) Labs Reviewed  ETHANOL - Abnormal; Notable for the following components:      Result Value   Alcohol, Ethyl (B) 108 (*)    All other components within normal limits  COMPREHENSIVE METABOLIC PANEL - Abnormal; Notable for the following components:   Sodium 130 (*)    Chloride 91 (*)    Glucose, Bld 121 (*)    BUN <5 (*)    All other components within normal limits  CBC WITH DIFFERENTIAL/PLATELET - Abnormal; Notable for the following components:   RBC 3.85 (*)    MCV 107.0 (*)    MCH 36.9 (*)    All other components within normal limits  RAPID URINE DRUG SCREEN, HOSP PERFORMED    EKG None  Radiology No results found.  Procedures Procedures   Medications Ordered in ED Medications  LORazepam (ATIVAN) injection 0-4 mg ( Intravenous See Alternative 03/22/21 0341)    Or  LORazepam (ATIVAN) tablet 0-4 mg (1 mg Oral Given 03/22/21 0341)  LORazepam (ATIVAN) injection 0-4 mg (has no administration in time range)    Or  LORazepam (ATIVAN) tablet 0-4 mg (has no administration in time range)  thiamine tablet 100 mg (has no administration in time range)    Or  thiamine (B-1) injection 100 mg (has no administration in time range)  sodium chloride 0.9 % bolus 1,000 mL (1,000 mLs Intravenous New Bag/Given 03/22/21 0458)    ED Course  I have reviewed the triage vital signs and the nursing notes.  Pertinent labs & imaging results that were available during my care of the patient were reviewed by me and considered in my medical decision making (see chart for details).  Clinical Course as of 03/22/21 [id number]  Tue Mar 22, 2021  [id number] Attempted to contact patient's friend, [name].  Went to [place] 8-->5 after ativan.   Patient does not meet criteria for inpatient detox.  Will provide outpatient resources. [CH]    Clinical Course User Index [CH] Sabien Umland, Barbette Hair, MD   MDM Rules/Calculators/A&P                           Patient presents requesting detox.  She is overall nontoxic and vital signs are largely reassuring.  She is not tachycardic or hypotensive.  She is slightly tremulous on exam.  She is chronically ill-appearing.  I have reviewed her chart including her recent primary care visit.  Appears recommendation by her primary provider was to seek detox.  Patient reports that she was turned away because of tremulousness.  Her initial CIWA score was 8.  She received p.o. Ativan with a repeat score of 5.  She has no history of withdrawal seizures.  She does not meet criteria for inpatient detox at this time.  Her other lab work-up is notable for sodium of 130 which is consistent with priors.  This is likely related to her alcohol abuse.  I have offered the patient a Librium taper to help with her symptoms so that she may further pursue outpatient options.  Additionally she was given a prescription for thiamine.  She is certainly at risk for Wernicke Korsakoff syndrome .I have messaged her primary physician regarding plan of care.  After history, exam, and medical workup I feel the patient has been appropriately medically screened and is safe for discharge home. Pertinent diagnoses were discussed with the patient. Patient was  given return precautions.  6:21 AM Per nursing, patient required 2 person assist.  Based on chart review, it does appear that she has had increasing significant needs at home; however, question her ability to safely be at home.  We will have case management evaluate.  We will place a PT consult.  Patient was placed back on CIWA protocol.  Final Clinical Impression(s) / ED Diagnoses Final diagnoses:  Alcohol abuse  Hyponatremia    Rx / DC Orders ED Discharge Orders          Ordered     chlordiazePOXIDE (LIBRIUM) 25 MG capsule        03/22/21 0602    thiamine 250 MG tablet  Daily        03/22/21 0602             Danie Hannig, Barbette Hair, MD 03/22/21 [id number]    Merryl Hacker, MD 03/22/21 [id number]    Merryl Hacker, MD 03/22/21 [phone]

## 2021-03-22 NOTE — ED Notes (Signed)
This RN and [employer] EMT assisted this pt via wheelchair to the restroom. Pt had BM and then proceeded to urinate on floor. Pt tremors worse when ambulating. Pt is two person assist. Pt changed into gown and brief. Pt back to bed. Dr. Dina Rich aware.

## 2021-03-22 NOTE — ED Notes (Signed)
Pt continues to try to get out of the bed.Pt redirected back laying down and explained to pt to use call bell if she needs help.

## 2021-03-22 NOTE — ED Notes (Signed)
Pt unsure if she could walk to bathroom or not. Pt assisted onto bed pan. Pt urinated and had BM.

## 2021-03-22 NOTE — ED Notes (Signed)
Patients jewelry removed and placed into cup with patient label and bag withy patient label. Jewelry includes 3 earrings (one par and one single), 3 rings, one necklace, one gold colored bracelet, and black colored bracelet and 3 beaded bracelets.

## 2021-03-22 NOTE — ED Notes (Signed)
Per MD Horton pt can eat and drink. Reassess CIWA in 2 hrs

## 2021-03-22 NOTE — H&P (Addendum)
History and Physical    Nicole MONFORT [id number] DOB: 02/08/1956 DOA: 03/21/2021  Referring MD/NP/PA: Thayer Jew, MD PCP: Carollee Herter, Alferd Apa, DO  Patient coming from: Home(alone)  Chief Complaint: Alcohol problem  I have personally briefly reviewed patient's old medical records in [place]   HPI: [name] is a 65 y.o. female with medical history significant of hypertension, alcohol abuse, anxiety, and depression presents with what she reports is an alcohol problem.  Admits to drinking 4-5 beers per day on average and intermittently drinking wine for over 30 years.  Her last drink was 2 days ago.  Her friend Daine Floras was able to provide additional history over the phone. Her friend tried to take her to [facility] in [place] for alcohol rehab, but she was declined admission as she was significantly debilitated needing assistance.  Patient reports that she has been getting around with use of a walker or cane at home where she lives alone.  However, her friends reports that the patient had not been able to care for self at home, and was not even able to go upstairs in her condo due to weakness.  When the friend tried to take her to rehab yesterday she reported that she smelled of alcohol, was confused, and had fallen.  Patient is a retired Corporate treasurer.  She denies having any recent nausea, vomiting, diarrhea, chest pain, or shortness of breath.  Patient chronically has joint pains which she relates to arthritis.  ED Course: Upon admission into the emergency department patient was seen to be afebrile with blood pressures elevated up to 177/102, and all other vital signs maintained.  Labs from 9/12 significant for sodium 130 and alcohol level of 108.  Patient's initial CIWA score was 8 and after Ativan 5.  However, patient noted to be unable to complete ADLs and had significantly ataxic gait.  TRH called to admit.  Review of Systems   Constitutional:  Positive for malaise/fatigue. Negative for fever.  HENT:  Negative for congestion and ear discharge.   Eyes:  Negative for pain and discharge.  Respiratory:  Negative for cough and shortness of breath.   Cardiovascular:  Negative for chest pain and leg swelling.  Gastrointestinal:  Negative for abdominal pain, diarrhea, nausea and vomiting.  Genitourinary:  Negative for dysuria and hematuria.  Musculoskeletal:  Positive for falls and joint pain.  Skin:  Negative for rash.  Neurological:  Positive for tremors and weakness.  Psychiatric/Behavioral:  Positive for substance abuse.        Positive for confusion   Past Medical History:  Diagnosis Date   Anxiety    Cancer ([place]) 04/2014   squamous cell carcinoma L inner thigh   Depression    Hard of hearing    Hypertension    Osteopenia    Substance abuse ([place])    TIA (transient ischemic attack) 2016    Past Surgical History:  Procedure Laterality Date   AUGMENTATION MAMMAPLASTY Bilateral 1988   BREAST ENHANCEMENT SURGERY     BUNIONECTOMY     GUM SURGERY  11/2017   NOSE SURGERY     Epistaxis   TONSILLECTOMY       reports that she has never smoked. She has never used smokeless tobacco. She reports current alcohol use. She reports that she does not use drugs.  Allergies  Allergen Reactions   Codeine Other (See Comments)    REACTION: insomnia and anxious    Family History  Problem Relation Age of Onset   Hypertension Mother    Kidney disease Mother        dialysis   Alzheimer's disease Mother    Hyperlipidemia Father    Stroke Paternal Grandmother    Rheum arthritis Paternal Grandmother    Breast cancer Neg Hx    Colon cancer Neg Hx    Esophageal cancer Neg Hx    Rectal cancer Neg Hx    Stomach cancer Neg Hx     Prior to Admission medications   Medication Sig Start Date End Date Taking? Authorizing Provider  chlordiazePOXIDE (LIBRIUM) 25 MG capsule '50mg'$  PO TID x 1D, then 25-'50mg'$  PO BID X 1D, then  25-'50mg'$  PO QD X 1D 03/22/21  Yes Horton, Barbette Hair, MD  alendronate (FOSAMAX) 70 MG tablet Take with a full glass of water on an empty stomach. 12/27/20   Ann Held, DO  amoxicillin (AMOXIL) 500 MG tablet Take 500 mg by mouth 3 (three) times daily. 07/21/20   [provider]  chlorhexidine (PERIDEX) 0.12 % solution SMARTSIG:By Mouth 09/08/20   [provider]  Coenzyme Q10 (COQ10) 100 MG CAPS Take 1 tablet by mouth daily.     [provider]  Cyanocobalamin (VITAMIN B-12 PO) Take 1 tablet by mouth daily.    [provider]  lisinopril (ZESTRIL) 40 MG tablet TAKE 1 TABLET BY MOUTH EVERY DAY 02/23/21   Carollee Herter, Alferd Apa, DO  metoprolol succinate (TOPROL-XL) 100 MG 24 hr tablet Take 1 tablet (100 mg total) by mouth daily. Take with or immediately following a meal. 12/27/20   Carollee Herter, Alferd Apa, DO  mirabegron ER (MYRBETRIQ) 50 MG TB24 tablet Take 1 tablet (50 mg total) by mouth daily. 06/28/20   Ann Held, DO  Multiple Vitamin (MULTIVITAMIN) capsule Take by mouth.    [provider]  thiamine 250 MG tablet Take 1 tablet (250 mg total) by mouth daily. 03/22/21   Merryl Hacker, MD    Physical Exam:  Constitutional: Frail elderly appearing female who is tremulous  Vitals:   03/21/21 2139 03/21/21 2358 03/22/21 0242 03/22/21 0652  BP: (!) 160/94 (!) 177/102 (!) 166/97 (!) 161/95  Pulse: 75 68 72 83  Resp: '16 17 14 20  '$ Temp:  (!) 97.4 F (36.3 C) 98.2 F (36.8 C)   TempSrc:      SpO2: 100% 100% 100% 100%  Weight:      Height:       Eyes: PERRL, lids and conjunctivae normal ENMT: Mucous membranes are moist. Posterior pharynx clear of any exudate or lesions.  Neck: normal, supple, no masses, no thyromegaly Respiratory: clear to auscultation bilaterally, no wheezing, no crackles. Normal respiratory effort. No accessory muscle use.  Cardiovascular: Regular rate and rhythm, no murmurs / rubs / gallops. No extremity edema.  2+ pedal pulses. No carotid bruits.  Abdomen: no tenderness, no masses palpated. No hepatosplenomegaly. Bowel sounds positive.  Musculoskeletal: no clubbing / cyanosis.  Nodules appreciated at the joints of the fingers. Skin: Multiple abrasions and bruises noted of the bilateral lower extremities Neurologic: CN 2-12 grossly intact.  Mild tremor appreciated.  Strength 4/5 in all 4 extremities. Psychiatric: Poor judgment and insight. Alert and oriented x 3, but does confabulate at times.  Anxious mood.     Labs on Admission: I have personally reviewed following labs and imaging studies  CBC: Recent Labs  Lab 03/18/21 1128 03/21/21 1524  WBC 5.6 5.1  NEUTROABS 3.3 3.3  HGB 13.5 14.2  HCT 39.5 41.2  MCV 111.0 Repeated and verified X2.* 107.0*  PLT 339.0 123456   Basic Metabolic Panel: Recent Labs  Lab 03/18/21 1128 03/21/21 1524  NA 131* 130*  K 4.7 3.9  CL 93* 91*  CO2 26 25  GLUCOSE 69* 121*  BUN 6 <5*  CREATININE 0.67 0.66  CALCIUM 10.0 9.1   GFR: Estimated Creatinine Clearance: 51 mL/min (by C-G formula based on SCr of 0.66 mg/dL). Liver Function Tests: Recent Labs  Lab 03/18/21 1128 03/21/21 1524  AST 30 38  ALT 21 23  ALKPHOS 98 89  BILITOT 0.7 0.3  PROT 7.1 7.2  ALBUMIN 4.4 4.3   No results for input(s): LIPASE, AMYLASE in the last 168 hours. Recent Labs  Lab 03/18/21 1128  AMMONIA 22   Coagulation Profile: No results for input(s): INR, PROTIME in the last 168 hours. Cardiac Enzymes: No results for input(s): CKTOTAL, CKMB, CKMBINDEX, TROPONINI in the last 168 hours. BNP (last 3 results) No results for input(s): PROBNP in the last 8760 hours. HbA1C: No results for input(s): HGBA1C in the last 72 hours. CBG: No results for input(s): GLUCAP in the last 168 hours. Lipid Profile: No results for input(s): CHOL, HDL, LDLCALC, TRIG, CHOLHDL, LDLDIRECT in the last 72 hours. Thyroid Function Tests: No results for input(s): TSH, T4TOTAL, FREET4, T3FREE,  THYROIDAB in the last 72 hours. Anemia Panel: No results for input(s): VITAMINB12, FOLATE, FERRITIN, TIBC, IRON, RETICCTPCT in the last 72 hours. Urine analysis:    Component Value Date/Time   COLORURINE YELLOW 10/07/2015 2139   APPEARANCEUR CLEAR 10/07/2015 2139   LABSPEC 1.003 (L) 10/07/2015 2139   PHURINE 6.5 10/07/2015 2139   GLUCOSEU NEGATIVE 10/07/2015 2139   HGBUR NEGATIVE 10/07/2015 2139   HGBUR large 10/22/2009 1550   BILIRUBINUR Negative 06/28/2020 [place] 10/07/2015 2139   PROTEINUR Negative 06/28/2020 1412   PROTEINUR NEGATIVE 10/07/2015 2139   UROBILINOGEN 0.2 06/28/2020 1412   UROBILINOGEN 0.2 10/22/2009 1550   NITRITE Negative 06/28/2020 1412   NITRITE NEGATIVE 10/07/2015 2139   LEUKOCYTESUR Negative 06/28/2020 1412   Sepsis Labs: No results found for this or any previous visit (from the past 240 hour(s)).   Radiological Exams on Admission: No results found.  EKG: Independently reviewed.  Sinus rhythm at 86 bpm  Assessment/Plan Alcohol abuse with withdrawals: Patient was noted to have alcohol level of 108 on admission, but noted to be significantly tremulous for which alcohol withdrawals was suspected.-Currently note liver function studies within normal limits.  Initial CIWA of 8 improved to 5 with Ativan.  However, noted patient was unable to ambulate or care for self as she reportedly lives alone.  -Admit to medical telemetry bed -CIWA protocols initiated with scheduled and as needed Ativan -Check ammonia, magnesium, and phosphorus level -Thiamine 500 mg daily -Multivitamin and folate -Transitions of care consulted  Frequent falls gait disturbance: Patient has had several falls at home.  Has multiple bruises and abrasions noted of the bilateral lower extremities.  Question the possibility of alcoholic induced neuropathy or possibly wernicke encephalopathy. -Check vitamin B12 -PT/OT to evaluate and treat -Check MRI Brain  Hyponatremia:  Acute on chronic.  Sodium 130 on admission.  Has any nausea, vomiting, or diarrhea symptoms.  Suspect secondary to patient's history of alcohol abuse.  Patient had been given 2 L normal saline IV fluids. -Continue to monitor sodium levels  Essential hypertension: On admission blood pressure elevated up to 177/102.  Home blood pressure medications  include metoprolol 100 mg daily and lisinopril 40 mg daily. 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  Currently not on any kind of treatment. -Recommend outpatient follow-up with behavioral health  Suspected alcoholic liver cirrhosis: On recent ultrasound from 9/9 patient was noted to have hepatic history of ptosis with mildly nodular liver contours suggesting cirrhosis without signs of ascites and dilated common bile duct measuring 9 mm(previously 10 mm on ultrasound from 04/2020).  Liver enzymes were noted to be within normal limits at this time.   DVT prophylaxis: Lovenox Code Status: Full Family Communication: Friends,  updated over the phone Disposition Plan: To be determined Consults called: None Admission status: Inpatient, require more than 2 midnight stay for close monitoring of alcohol withdrawals  Norval Morton MD [facility]   If 7PM-7AM, please contact night-coverage   03/22/2021, 7:58 AM

## 2021-03-22 NOTE — Evaluation (Signed)
Physical Therapy Evaluation Patient Details Name: [name] MRN: [id number] DOB: 06/05/1956 Today's Date: 03/22/2021  History of Present Illness  Pt presented to ED on 9/12 with etoh abuse with withdrawals and frequent falls at home. Pt also with hyponatremia. 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  Clinical Impression  Pt presents to PT with weakness, poor cognition, and unsteady gait. Pt lives alone and is unable to manage basic mobility on her own and definitely not able to manage other basic activities at home. Recommend SNF.        Recommendations for follow up therapy are one component of a multi-disciplinary discharge planning process, led by the attending physician.  Recommendations may be updated based on patient status, additional functional criteria and insurance authorization.  Follow Up Recommendations SNF    Equipment Recommendations  None recommended by PT    Recommendations for Other Services       Precautions / Restrictions Precautions Precautions: Fall      Mobility  Bed Mobility Overal bed mobility: Needs Assistance Bed Mobility: Supine to Sit;Sit to Supine     Supine to sit: Min guard;HOB elevated Sit to supine: Min guard;HOB elevated   General bed mobility comments: Assist for safety and verbal cues for sequencing    Transfers Overall transfer level: Needs assistance Equipment used: 4-wheeled walker Transfers: Sit to/from Stand Sit to Stand: Min assist;From elevated surface         General transfer comment: Assist for balance  Ambulation/Gait Ambulation/Gait assistance: Mod assist Gait Distance (Feet): 15 Feet Assistive device: 4-wheeled walker Gait Pattern/deviations: Step-through pattern;Decreased step length - left;Decreased step length - right;Ataxic;Wide base of support;Trunk flexed Gait velocity: decr Gait velocity interpretation: <1.31 ft/sec, indicative of household ambulator General Gait Details: Assist  for balance and support. Flexed knees throughout. Assist to manage walker.  Stairs            Wheelchair Mobility    Modified Rankin (Stroke Patients Only)       Balance Overall balance assessment: Needs assistance Sitting-balance support: No upper extremity supported;Feet supported Sitting balance-Leahy Scale: Fair     Standing balance support: Single extremity supported;Bilateral upper extremity supported Standing balance-Leahy Scale: Poor Standing balance comment: UE support and min assist for static standing                             Pertinent Vitals/Pain Pain Assessment: No/denies pain    Home Living Family/patient expects to be discharged to:: Private residence Living Arrangements: Alone   Type of Home: Other(Comment)       Home Layout: Two level;Bed/bath upstairs;1/2 bath on main level Home Equipment: Cane - single point;Walker - 2 wheels      Prior Function Level of Independence: Needs assistance   Gait / Transfers Assistance Needed: Pt uses walker or cane at home. Neighbor or friend reports pt wasn't able to go up the stairs in her condo prior to admission           Hand Dominance   Dominant Hand: Left    Extremity/Trunk Assessment   Upper Extremity Assessment Upper Extremity Assessment: Defer to OT evaluation    Lower Extremity Assessment Lower Extremity Assessment: Generalized weakness       Communication   Communication: HOH  Cognition Arousal/Alertness: Awake/alert Behavior During Therapy: WFL for tasks assessed/performed Overall Cognitive Status: Impaired/Different from baseline Area of Impairment: Orientation;Attention;Memory;Safety/judgement;Following commands;Problem solving;Awareness  Orientation Level: Disoriented to;Situation Current Attention Level: Sustained Memory: Decreased short-term memory Following Commands: Follows one step commands with increased time Safety/Judgement:  Decreased awareness of safety;Decreased awareness of deficits Awareness: Intellectual Problem Solving: Slow processing;Difficulty sequencing;Requires verbal cues;Requires tactile cues        General Comments General comments (skin integrity, edema, etc.): VSS on RA    Exercises     Assessment/Plan    PT Assessment Patient needs continued PT services  PT Problem List Decreased strength;Decreased balance;Decreased cognition;Decreased knowledge of precautions;Decreased mobility;Decreased knowledge of use of DME;Decreased activity tolerance;Decreased safety awareness       PT Treatment Interventions DME instruction;Functional mobility training;Balance training;Patient/family education;Gait training;Therapeutic activities;Therapeutic exercise    PT Goals (Current goals can be found in the Care Plan section)  Acute Rehab PT Goals Patient Stated Goal: didn't state PT Goal Formulation: With patient Time For Goal Achievement: 04/05/21 Potential to Achieve Goals: Good    Frequency Min 3X/week   Barriers to discharge        Co-evaluation               AM-PAC PT "6 Clicks" Mobility  Outcome Measure Help needed turning from your back to your side while in a flat bed without using bedrails?: A Little Help needed moving from lying on your back to sitting on the side of a flat bed without using bedrails?: A Little Help needed moving to and from a bed to a chair (including a wheelchair)?: A Little Help needed standing up from a chair using your arms (e.g., wheelchair or bedside chair)?: A Little Help needed to walk in hospital room?: A Lot Help needed climbing 3-5 steps with a railing? : Total 6 Click Score: 15    End of Session Equipment Utilized During Treatment: Gait belt Activity Tolerance: Patient limited by fatigue Patient left: in bed;with call bell/phone within reach   PT Visit Diagnosis: Unsteadiness on feet (R26.81);Repeated falls (R29.6);Ataxic gait (R26.0)     Time: [id number] PT Time Calculation (min) (ACUTE ONLY): 15 min   Charges:   PT Evaluation $PT Eval Moderate Complexity: [place] Pager [phone] Office [place] 03/22/2021, 5:56 PM

## 2021-03-22 NOTE — ED Notes (Signed)
MRI notified this RN that patient was unable to sit for MRI exam. Reported that patient kept sitting up and pulling monitoring devices off. Provider made aware

## 2021-03-22 NOTE — Discharge Instructions (Addendum)
You were seen today for ongoing alcohol use.  You do not meet criteria for inpatient treatment or detox.  Use Librium as an outpatient to help you detox.  You should follow-up with outpatient resources regarding outpatient detox options and coverage services.  Your sodium remains somewhat low.  This is likely related to your chronic drinking.  Make sure you are drinking plenty of electrolyte rich fluids.

## 2021-03-23 ENCOUNTER — Inpatient Hospital Stay (HOSPITAL_COMMUNITY): Payer: BC Managed Care – PPO

## 2021-03-23 ENCOUNTER — Encounter (HOSPITAL_COMMUNITY): Payer: Self-pay | Admitting: Internal Medicine

## 2021-03-23 DIAGNOSIS — R296 Repeated falls: Secondary | ICD-10-CM | POA: Diagnosis not present

## 2021-03-23 DIAGNOSIS — F1010 Alcohol abuse, uncomplicated: Secondary | ICD-10-CM | POA: Diagnosis not present

## 2021-03-23 DIAGNOSIS — F419 Anxiety disorder, unspecified: Secondary | ICD-10-CM | POA: Diagnosis not present

## 2021-03-23 DIAGNOSIS — I10 Essential (primary) hypertension: Secondary | ICD-10-CM | POA: Diagnosis not present

## 2021-03-23 LAB — HEPATITIS PANEL, ACUTE
HCV Ab: NONREACTIVE
Hep A IgM: NONREACTIVE
Hep B C IgM: NONREACTIVE
Hepatitis B Surface Ag: NONREACTIVE

## 2021-03-23 LAB — BASIC METABOLIC PANEL
Anion gap: 14 (ref 5–15)
BUN: <5 mg/dL — ABNORMAL LOW (ref 8–23)
CO2: 24 mmol/L (ref 22–32)
Calcium: 9.6 mg/dL (ref 8.9–10.3)
Chloride: 91 mmol/L — ABNORMAL LOW (ref 98–111)
Creatinine, Ser: 0.53 mg/dL (ref 0.44–1.00)
GFR, Estimated: >60 mL/min (ref >60)
Glucose, Bld: 99 mg/dL (ref 70–99)
Potassium: 3.4 mmol/L — ABNORMAL LOW (ref 3.5–5.1)
Sodium: 129 mmol/L — ABNORMAL LOW (ref 135–145)

## 2021-03-23 LAB — CBC
HCT: 39.3 % (ref 36.0–46.0)
Hemoglobin: 14.4 g/dL (ref 12.0–15.0)
MCH: 38.1 pg — ABNORMAL HIGH (ref 26.0–34.0)
MCHC: 36.6 g/dL — ABNORMAL HIGH (ref 30.0–36.0)
MCV: 104 fL — ABNORMAL HIGH (ref 80.0–100.0)
Platelets: 289 10*3/uL (ref 150–400)
RBC: 3.78 MIL/uL — ABNORMAL LOW (ref 3.87–5.11)
RDW: 12.3 % (ref 11.5–15.5)
WBC: 6.9 10*3/uL (ref 4.0–10.5)
nRBC: 0 % (ref 0.0–0.2)

## 2021-03-23 LAB — BRAIN NATRIURETIC PEPTIDE: B Natriuretic Peptide: 84.1 pg/mL (ref 0.0–100.0)

## 2021-03-23 LAB — URINALYSIS, ROUTINE W REFLEX MICROSCOPIC
Bilirubin Urine: NEGATIVE
Glucose, UA: 50 mg/dL — AB
Hgb urine dipstick: NEGATIVE
Ketones, ur: 5 mg/dL — AB
Leukocytes,Ua: NEGATIVE
Nitrite: NEGATIVE
Protein, ur: NEGATIVE mg/dL
Specific Gravity, Urine: 1.009 (ref 1.005–1.030)
pH: 7 (ref 5.0–8.0)

## 2021-03-23 LAB — OSMOLALITY: Osmolality: 270 mOsm/kg — ABNORMAL LOW (ref 275–295)

## 2021-03-23 LAB — TSH: TSH: 1.282 u[IU]/mL (ref 0.350–4.500)

## 2021-03-23 LAB — URIC ACID: Uric Acid, Serum: 3 mg/dL (ref 2.5–7.1)

## 2021-03-23 MED ORDER — CYANOCOBALAMIN 1000 MCG/ML IJ SOLN
1000.0000 ug | Freq: Every day | INTRAMUSCULAR | Status: AC
Start: 2021-03-23 — End: 2021-03-29
  Administered 2021-03-23 – 2021-03-29 (×7): 1000 ug via SUBCUTANEOUS
  Filled 2021-03-23 (×7): qty 1

## 2021-03-23 MED ORDER — LACTATED RINGERS IV SOLN: INTRAVENOUS | Status: DC

## 2021-03-23 MED ORDER — VITAMIN B-12 1000 MCG PO TABS
1000.0000 ug | ORAL_TABLET | Freq: Two times a day (BID) | ORAL | Status: DC
  Administered 2021-03-30 – 2021-04-08 (×20): 1000 ug via ORAL
  Filled 2021-03-23 (×20): qty 1

## 2021-03-23 MED ORDER — PANTOPRAZOLE SODIUM 40 MG PO TBEC
40.0000 mg | DELAYED_RELEASE_TABLET | Freq: Every day | ORAL | Status: DC
  Administered 2021-03-23 – 2021-04-08 (×17): 40 mg via ORAL
  Filled 2021-03-23 (×17): qty 1

## 2021-03-23 MED ORDER — POTASSIUM CHLORIDE 20 MEQ PO PACK
40.0000 meq | PACK | Freq: Once | ORAL | Status: AC
  Administered 2021-03-23: 40 meq via ORAL
  Filled 2021-03-23: qty 2

## 2021-03-23 MED ORDER — MAGNESIUM SULFATE IN D5W 1-5 GM/100ML-% IV SOLN
1.0000 g | Freq: Once | INTRAVENOUS | Status: AC
  Administered 2021-03-23: 1 g via INTRAVENOUS
  Filled 2021-03-23: qty 100

## 2021-03-23 MED ORDER — CHLORDIAZEPOXIDE HCL 5 MG PO CAPS
10.0000 mg | ORAL_CAPSULE | Freq: Three times a day (TID) | ORAL | Status: DC
  Administered 2021-03-23 – 2021-03-26 (×9): 10 mg via ORAL
  Filled 2021-03-23 (×9): qty 2

## 2021-03-23 NOTE — Progress Notes (Addendum)
Patient asked nurse about her cell phone. Nurse searched room and did not find a cell phone. Linen searched as well as patient's belongings and purse. Nurse called POA Tye Maryland) to ask if any visitors that came today may have taken her cell phone.Tye Maryland stated that Daine Floras was the one that visited today and she would contact her to see if she has the phone.   Tye Maryland called back and stated that she is unable to get in touch with Susie at the moment, but that Susie would be visiting again the morning.

## 2021-03-23 NOTE — Progress Notes (Signed)
PROGRESS NOTE                                                                                                                                                                                                             Patient Demographics:    [name], is a 64 y.o. female, DOB - 29-Aug-1955, [id number]  Outpatient Primary MD for the patient is Ann Held, DO    LOS - 1  Admit date - 03/21/2021    Chief Complaint  Patient presents with   Alcohol Problem       Brief Narrative (HPI from H&P)  -  [name] is a 65 y.o. female with medical history significant of hypertension, alcohol abuse, anxiety, and depression presents with what she reports is an alcohol problem.  Admits to drinking 4-5 beers per day on average and intermittently drinking wine for over 30 years.  Her last drink was 2 days ago.  Her friend Daine Floras was able to provide additional history over the phone. Her friend tried to take her to [facility] in [place] for alcohol rehab, but she was declined admission as she was significantly debilitated needing assistance, in the ER she was found to be in early DTs, had ataxia and difficulty in standing up and ambulating.  She was admitted for further care.   Subjective:    [name] today has, No headache, No chest pain, No abdominal pain - No Nausea, No new weakness tingling or numbness, no SOB.   Assessment  & Plan :     Alcohol abuse with early DTs.-Counseled to quit alcohol, CIWA protocol.  Will add scheduled Librium as well.  2.  Multiple falls with some ataxia.  MRI brain nonacute however limited due to motion artifact, continue PT OT, continue replacement of thiamine and B12, B12 levels were borderline, thiamine levels were added to blood work however she has received some supplemental thiamine by that time.  Could have early Wernicke's encephalopathy  as well however she has minimal to no nystagmus at this time.  Continue to monitor with [id number], folic acid and thiamine supplementation.  Will likely require SNF.  3.  Hyponatremia.  Hydrate.  Your serum osmolality is low, urine electrolytes are pending.  Uric acid is stable.  4.  Hypertension.  Blood pressure medications adjusted  for better control.  5.  Possible underlying alcoholic cirrhosis.  Ultrasound noted.  Acute hepatitis panel will be added.  For now stable INR and LFTs.  Outpatient GI follow-up      Condition - Fair  Family Communication  :  None present  Code Status :  Full  Consults  :  None  PUD Prophylaxis : PPI   Procedures  :     RUQ [place].  Possible cirrhosis  MRI brain-nonacute.      Disposition Plan  :    Status is: Inpatient  Remains inpatient appropriate because:IV treatments appropriate due to intensity of illness or inability to take PO  Dispo: The patient is from: Home              Anticipated d/c is to: SNF              Patient currently is not medically stable to d/c.   Difficult to place patient No  DVT Prophylaxis  :    enoxaparin (LOVENOX) injection 40 mg Start: 03/22/21 1600    Lab Results  Component Value Date   PLT 289 03/23/2021    Diet :  Diet Order             Diet Heart Room service appropriate? Yes; Fluid consistency: Thin  Diet effective now                    Inpatient Medications  Scheduled Meds:  cyanocobalamin  1,000 mcg Subcutaneous Daily   enoxaparin (LOVENOX) injection  40 mg Subcutaneous [id number]   folic acid  1 mg Oral Daily   lisinopril  40 mg Oral Daily   LORazepam  0-4 mg Intravenous Q6H   Or   LORazepam  0-4 mg Oral Q6H   [START ON 03/24/2021] LORazepam  0-4 mg Intravenous Q12H   Or   [START ON 03/24/2021] LORazepam  0-4 mg Oral Q12H   metoprolol succinate  100 mg Oral Daily   multivitamin with minerals  1 tablet Oral Daily   potassium chloride  40 mEq Oral Once   sodium chloride flush  3 mL  Intravenous Q12H   thiamine  500 mg Oral Q24H   [START ON 03/30/2021] vitamin B-12  1,000 mcg Oral BID   Continuous Infusions:  lactated ringers     magnesium sulfate bolus IVPB 1 g (03/23/21 1301)   thiamine injection     PRN Meds:.acetaminophen **OR** acetaminophen, albuterol, hydrALAZINE, LORazepam **OR** LORazepam  Antibiotics  :    Anti-infectives (From admission, onward)    None        Time Spent in minutes  30   Lala Lund M.D on 03/23/2021 at 1:02 PM  To page go to [email or web address]   [facility] -  Office  [phone]    See all Orders from today for further details    Objective:   Vitals:   03/22/21 2344 03/23/21 0400 03/23/21 0744 03/23/21 1217  BP: (!) 136/95 129/79 138/90 (!) 165/98  Pulse: 90 87 (!) 103 87  Resp: '19 17 20 16  '$ Temp: 97.8 F (36.6 C) 98.3 F (36.8 C) 98.7 F (37.1 C) 98.4 F (36.9 C)  TempSrc: Oral Oral Oral Oral  SpO2: 98% 99% 100% 100%  Weight: 47.5 kg     Height: 5' (1.524 m)       Wt Readings from Last 3 Encounters:  03/22/21 47.5 kg  03/18/21 51.8 kg  12/27/20 50.8 kg     Intake/Output Summary (  Last 24 hours) at 03/23/2021 1302 Last data filed at 03/23/2021 0717 Gross per 24 hour  Intake 240 ml  Output 100 ml  Net 140 ml     Physical Exam  Awake Alert, No new F.N deficits, Normal affect Advance.AT,PERRAL Supple Neck,No JVD, No cervical lymphadenopathy appriciated.  Symmetrical Chest wall movement, Good air movement bilaterally, CTAB RRR,No Gallops,Rubs or new Murmurs, No Parasternal Heave +ve B.Sounds, Abd Soft, No tenderness, No organomegaly appriciated, No rebound - guarding or rigidity. No Cyanosis, Clubbing or edema, No new Rash or bruise      Data Review:    CBC Recent Labs  Lab 03/18/21 1128 03/21/21 1524 03/23/21 0115  WBC 5.6 5.1 6.9  HGB 13.5 14.2 14.4  HCT 39.5 41.2 39.3  PLT 339.0 358 289  MCV 111.0 Repeated and verified X2.* 107.0* 104.0*  MCH  --  36.9* 38.1*  MCHC 34.2 34.5 36.6*   RDW 13.7 12.6 12.3  LYMPHSABS 1.4 1.2  --   MONOABS 0.7 0.5  --   EOSABS 0.1 0.1  --   BASOSABS 0.1 0.1  --     Recent Labs  Lab 03/18/21 1128 03/21/21 1524 03/22/21 1416 03/22/21 1420 03/23/21 0115  NA 131* 130*  --   --  129*  K 4.7 3.9  --   --  3.4*  CL 93* 91*  --   --  91*  CO2 26 25  --   --  24  GLUCOSE 69* 121*  --   --  99  BUN 6 <5*  --   --  <5*  CREATININE 0.67 0.66  --   --  0.53  CALCIUM 10.0 9.1  --   --  9.6  AST 30 38  --   --   --   ALT 21 23  --   --   --   ALKPHOS 98 89  --   --   --   BILITOT 0.7 0.3  --   --   --   ALBUMIN 4.4 4.3  --   --   --   MG  --   --   --  1.7  --   INR  --   --   --  1.0  --   TSH 1.75  --   --  1.282  --   AMMONIA 22  --  12  --   --     ------------------------------------------------------------------------------------------------------------------ No results for input(s): CHOL, HDL, LDLCALC, TRIG, CHOLHDL, LDLDIRECT in the last 72 hours.  Lab Results  Component Value Date   HGBA1C 5.2 06/14/2015   ------------------------------------------------------------------------------------------------------------------ Recent Labs    03/22/21 1420  TSH 1.282    Cardiac Enzymes No results for input(s): CKMB, TROPONINI, MYOGLOBIN in the last 168 hours.  Invalid input(s): CK ------------------------------------------------------------------------------------------------------------------ No results found for: BNP  Micro Results No results found for this or any previous visit (from the past 240 hour(s)).  Radiology Reports CT HEAD WO CONTRAST (5MM)  Result Date: 02/21/2021 CLINICAL DATA:  Head trauma, moderate severity, numerous falls over the past few months EXAM: CT HEAD WITHOUT CONTRAST TECHNIQUE: Contiguous axial images were obtained from the base of the skull through the vertex without intravenous contrast. COMPARISON:  06/18/2015 FINDINGS: Brain: No evidence of acute infarction, hemorrhage, extra-axial  collection, or mass effect. Ventriculomegaly commensurate with the degree of cerebral atrophy. Generalized cerebral atrophy. Periventricular white matter low attenuation likely secondary to microangiopathy. Vascular: Cerebrovascular atherosclerotic calcifications are noted. Skull: Negative for fracture  or focal lesion. Sinuses/Orbits: Visualized portions of the orbits are unremarkable. Visualized portions of the paranasal sinuses are unremarkable. Visualized portions of the mastoid air cells are unremarkable. Other: None. IMPRESSION: 1. No acute intracranial pathology. 2. Chronic microvascular disease and cerebral atrophy. Electronically Signed   By: Kathreen Devoid M.D.   On: 02/21/2021 18:07   MR BRAIN WO CONTRAST  Result Date: 03/23/2021 CLINICAL DATA:  Ataxia, question Wernicke's encephalopathy EXAM: MRI HEAD WITHOUT CONTRAST TECHNIQUE: Multiplanar, multiecho pulse sequences of the brain and surrounding structures were obtained without intravenous contrast. COMPARISON:  CT head 02/21/2021 FINDINGS: The study is limited due to patient cooperation. The provided sequences are moderately motion degraded. Brain: There is no evidence of acute intracranial hemorrhage, extra-axial fluid collection, or infarct. There is marked global parenchymal volume loss with enlargement of the ventricular system, advanced for age. There is no discernible lobar predominance. Confluent T2/FLAIR hyperintensity in the subcortical and periventricular white matter likely reflects sequela of chronic white matter microangiopathy. There are no findings of Wernicke's encephalopathy, though the mamillary bodies are not well evaluated. Vascular: Normal flow voids. Skull and upper cervical spine: Normal marrow signal. Sinuses/Orbits: The paranasal sinuses are clear. The globes and orbits are unremarkable. Other: None. IMPRESSION: 1. Motion degraded study as above. 2. No evidence of acute intracranial pathology. Specifically, no definite evidence  of Wernicke's encephalopathy, though the mamillary bodies are not well evaluated. Consider repeat imaging with intravenous contrast when the patient can tolerate, if indicated. 3. Marked global parenchymal volume loss without discernible lobar predominance, advanced for age. Electronically Signed   By: Valetta Mole M.D.   On: 03/23/2021 11:43   US Abdomen Limited RUQ (LIVER/GB)  Result Date: 03/21/2021 CLINICAL DATA:  alcoholic cirrhosis EXAM: ULTRASOUND ABDOMEN LIMITED RIGHT UPPER QUADRANT COMPARISON:  None. FINDINGS: Gallbladder: The gallbladder is nondilated without visible intraluminal stones. There is a non dependent, nonshadowing 4 mm nodular focus along the hepatic side gallbladder wall, which could be adherent sludge or small polyp, the latter of which would require no follow-up per consensus guidelines. Common bile duct: Diameter: 9 mm.  No obstructing lesion identified sonographically. Liver: Diffusely increased liver echogenicity with mildly nodular contours. Portal vein is patent on color Doppler imaging with normal direction of blood flow towards the liver. Other: None. IMPRESSION: Hepatic steatosis with mildly nodular liver contours, suggesting cirrhosis. No ascites. Dilated common bile duct measuring 9 mm, previously 10 mm on prior ultrasound in October 2021. If not already performed, MRI/MRCP is recommended for further evaluation. Electronically Signed   By: Maurine Simmering M.D.   On: 03/21/2021 11:56

## 2021-03-23 NOTE — Progress Notes (Signed)
CSW emailed financial counseling and [employer] to request they screen patient for Medicaid/Disability.   Gilmore Laroche, MSW, [facility]

## 2021-03-23 NOTE — Progress Notes (Signed)
Patient asking about a bag of "blood pressure medications" that she stated she brought to the hospital with her. Nurse searched patient's belongings and did not find any medications. Patient also looked in her purse and stated, "there is nothing in here". Nurse sent message to nurse that gave report from ER [place], rn) asking about medications. She stated there were no medications left in patient's previous room and it was not reported to her that patient had home medications.

## 2021-03-23 NOTE — Evaluation (Addendum)
Occupational Therapy Evaluation Patient Details Name: CORTLYNN NAGI MRN: [id number] DOB: 04/15/1956 Today's Date: 03/23/2021   History of Present Illness Pt presented to ED on 9/12 with etoh abuse with withdrawals and frequent falls at home. Pt also with hyponatremia. 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   Clinical Impression   PTA, pt was living at home alone, pt reports she was independent with ADL/IADL and was using a walker/cane during functional mobility. Pt with hx of falling. Pt received in bed, unaware she was saturated in urine due to purewic not working. Due to instability, cognitive limitations, generalized weakness pt currently requires minA for LB dressing, setupA for grooming and minA to take lateral side steps along EOB with RW.  Session was limited this date secondary to arrival of transport team to take pt to MRI. Due to decline in current level of function, pt would benefit from acute OT to address established goals to facilitate safe D/C to venue listed below. At this time, recommend SNF follow-up. Will continue to follow acutely.    HR 104bpm at rest, up to 118bpm with standing SpO2 95% at rest, poor wave form during mobility   Recommendations for follow up therapy are one component of a multi-disciplinary discharge planning process, led by the attending physician.  Recommendations may be updated based on patient status, additional functional criteria and insurance authorization.   Follow Up Recommendations  SNF;Supervision/Assistance - 24 hour    Equipment Recommendations  3 in 1 bedside commode    Recommendations for Other Services       Precautions / Restrictions Precautions Precautions: Fall Restrictions Weight Bearing Restrictions: No      Mobility Bed Mobility Overal bed mobility: Needs Assistance Bed Mobility: Supine to Sit;Sit to Supine     Supine to sit: Min guard;HOB elevated Sit to supine: Min guard;HOB elevated    General bed mobility comments: Assist for safety and verbal cues for sequencing    Transfers Overall transfer level: Needs assistance Equipment used: Rolling walker (2 wheeled) Transfers: Sit to/from Stand Sit to Stand: Min assist         General transfer comment: assist for powerup and stability in standing    Balance Overall balance assessment: Needs assistance Sitting-balance support: No upper extremity supported;Feet supported Sitting balance-Leahy Scale: Fair     Standing balance support: Single extremity supported;Bilateral upper extremity supported Standing balance-Leahy Scale: Poor Standing balance comment: heavy reliance on BUE support in standing and assist for stability from therapist                           ADL either performed or assessed with clinical judgement   ADL Overall ADL's : Needs assistance/impaired Eating/Feeding: Set up;Sitting   Grooming: Set up;Sitting Grooming Details (indicate cue type and reason): began grooming, but limited due to arrival of transport team Upper Body Bathing: Set up;Sitting   Lower Body Bathing: Minimal assistance;Sit to/from stand   Upper Body Dressing : Set up;Sitting   Lower Body Dressing: Minimal assistance;Sit to/from stand Lower Body Dressing Details (indicate cue type and reason): minA to don socks Toilet Transfer: RW;Minimal Print production planner Details (indicate cue type and reason): minAto take lateral side steps along EOB, unable to progress further mobility secondary to arrival of transport team. Pt unaware she was wet from urine Toileting- Clothing Manipulation and Hygiene: Moderate assistance;Sit to/from stand Toileting - Clothing Manipulation Details (indicate cue type and reason): assist for posterior care  Functional mobility during ADLs: Minimal assistance;Rolling walker General ADL Comments: pt limited due to cognition, generalized weakness, instability     Vision          Perception     Praxis      Pertinent Vitals/Pain Pain Assessment: No/denies pain     Hand Dominance Left   Extremity/Trunk Assessment Upper Extremity Assessment Upper Extremity Assessment: Generalized weakness   Lower Extremity Assessment Lower Extremity Assessment: Generalized weakness   Cervical / Trunk Assessment Cervical / Trunk Assessment: Kyphotic   Communication Communication Communication: HOH   Cognition Arousal/Alertness: Awake/alert Behavior During Therapy: WFL for tasks assessed/performed Overall Cognitive Status: Impaired/Different from baseline Area of Impairment: Orientation;Attention;Memory;Safety/judgement;Following commands;Problem solving;Awareness                 Orientation Level: Disoriented to;Situation Current Attention Level: Sustained Memory: Decreased short-term memory Following Commands: Follows one step commands with increased time Safety/Judgement: Decreased awareness of safety;Decreased awareness of deficits Awareness: Intellectual Problem Solving: Slow processing;Difficulty sequencing;Requires verbal cues;Requires tactile cues General Comments: pt with decreased safety awareness, slightly confused and required increased time for processing information   General Comments  vss on RA    Exercises     Shoulder Instructions      Home Living Family/patient expects to be discharged to:: Private residence Living Arrangements: Alone   Type of Home: Other(Comment)       Home Layout: Two level;Bed/bath upstairs;1/2 bath on main level Alternate Level Stairs-Number of Steps: flight             Home Equipment: Cane - single point;Walker - 2 wheels          Prior Functioning/Environment Level of Independence: Needs assistance  Gait / Transfers Assistance Needed: Pt uses walker or cane at home. Per PT eval, Neighbor or friend reports pt wasn't able to go up the stairs in her condo prior to admission ADL's / Homemaking  Assistance Needed: Pt reports she was independent with ADL            OT Problem List: Decreased activity tolerance;Impaired balance (sitting and/or standing);Decreased safety awareness;Decreased cognition      OT Treatment/Interventions: Self-care/ADL training;Therapeutic exercise;Energy conservation;DME and/or AE instruction;Cognitive remediation/compensation;Patient/family education;Balance training    OT Goals(Current goals can be found in the care plan section) Acute Rehab OT Goals Patient Stated Goal: didn't state OT Goal Formulation: With patient Time For Goal Achievement: 04/06/21 Potential to Achieve Goals: Good ADL Goals Pt Will Perform Grooming: standing;with supervision Pt Will Perform Lower Body Dressing: with supervision;sit to/from stand Pt Will Transfer to Toilet: with supervision;ambulating Additional ADL Goal #1: Pt will demonstrate anticipatory awareness for safe completion of ADL/IADL.  OT Frequency: Min 2X/week   Barriers to D/C:            Co-evaluation              AM-PAC OT "6 Clicks" Daily Activity     Outcome Measure Help from another person eating meals?: A Little Help from another person taking care of personal grooming?: A Little Help from another person toileting, which includes using toliet, bedpan, or urinal?: A Little Help from another person bathing (including washing, rinsing, drying)?: A Little Help from another person to put on and taking off regular upper body clothing?: A Little Help from another person to put on and taking off regular lower body clothing?: A Little 6 Click Score: 18   End of Session Equipment Utilized During Treatment: Gait belt;Rolling walker Nurse Communication: Mobility status  Activity Tolerance: Patient tolerated treatment well;Other (comment) (treatment limited due to arrival of transport team) Patient left: in bed;with call bell/phone within reach;with bed alarm set;with nursing/sitter in room  OT  Visit Diagnosis: Unsteadiness on feet (R26.81);History of falling (Z91.81);Muscle weakness (generalized) (M62.81)                Time: [id number] OT Time Calculation (min): 14 min Charges:  OT General Charges $OT Visit: 1 Visit OT Evaluation $OT Eval Moderate Complexity: [place] OTR/L Acute Rehabilitation Services Office: [place] 03/23/2021, 10:51 AM

## 2021-03-23 NOTE — Consult Note (Signed)
   [facility] CM Inpatient Consult   03/23/2021  [name] 1956/04/07 [id number]  [place] Organization [ACO] Patient: [employer] [place] [employer] [place]  Primary Care Provider:  Carollee Herter, Alferd Apa, DO, [facility], which is an Embedded provider with a Chronic Care Management program  This writer was alerted of the patient's potential admission on 03/22/21 by Pieter Partridge, LCSW requesting that inpatient LCSW follow for treatment program.   Patient in an Embedded practice which has a chronic disease management Embedded Care Management team.  Review reveals patient is being recommended for a skilled nursing facility level of care at this time.  Plan: Will follow for disposition and update [employer], LCSW.   Primary Care MD does TOC calls and follow up. Can refer to Embedded CCM team, if applicable for transition of care needs.  Please contact for further questions,  Natividad Brood, RN BSN [facility] Liaison  [phone] business mobile phone Toll free office [phone]  Fax number: [phone] [email or web address]

## 2021-03-24 DIAGNOSIS — F419 Anxiety disorder, unspecified: Secondary | ICD-10-CM | POA: Diagnosis not present

## 2021-03-24 DIAGNOSIS — F1010 Alcohol abuse, uncomplicated: Secondary | ICD-10-CM | POA: Diagnosis not present

## 2021-03-24 DIAGNOSIS — I10 Essential (primary) hypertension: Secondary | ICD-10-CM | POA: Diagnosis not present

## 2021-03-24 DIAGNOSIS — R296 Repeated falls: Secondary | ICD-10-CM | POA: Diagnosis not present

## 2021-03-24 LAB — CBC WITH DIFFERENTIAL/PLATELET
Abs Immature Granulocytes: 0.03 10*3/uL (ref 0.00–0.07)
Basophils Absolute: 0 10*3/uL (ref 0.0–0.1)
Basophils Relative: 1 %
Eosinophils Absolute: 0.1 10*3/uL (ref 0.0–0.5)
Eosinophils Relative: 1 %
HCT: 37.4 % (ref 36.0–46.0)
Hemoglobin: 13.5 g/dL (ref 12.0–15.0)
Immature Granulocytes: 1 %
Lymphocytes Relative: 19 %
Lymphs Abs: 1 10*3/uL (ref 0.7–4.0)
MCH: 37.8 pg — ABNORMAL HIGH (ref 26.0–34.0)
MCHC: 36.1 g/dL — ABNORMAL HIGH (ref 30.0–36.0)
MCV: 104.8 fL — ABNORMAL HIGH (ref 80.0–100.0)
Monocytes Absolute: 0.6 10*3/uL (ref 0.1–1.0)
Monocytes Relative: 12 %
Neutro Abs: 3.6 10*3/uL (ref 1.7–7.7)
Neutrophils Relative %: 66 %
Platelets: 266 10*3/uL (ref 150–400)
RBC: 3.57 MIL/uL — ABNORMAL LOW (ref 3.87–5.11)
RDW: 12 % (ref 11.5–15.5)
WBC: 5.3 10*3/uL (ref 4.0–10.5)
nRBC: 0 % (ref 0.0–0.2)

## 2021-03-24 LAB — COMPREHENSIVE METABOLIC PANEL
ALT: 24 U/L (ref 0–44)
AST: 45 U/L — ABNORMAL HIGH (ref 15–41)
Albumin: 3.8 g/dL (ref 3.5–5.0)
Alkaline Phosphatase: 71 U/L (ref 38–126)
Anion gap: 11 (ref 5–15)
BUN: <5 mg/dL — ABNORMAL LOW (ref 8–23)
CO2: 24 mmol/L (ref 22–32)
Calcium: 9.6 mg/dL (ref 8.9–10.3)
Chloride: 95 mmol/L — ABNORMAL LOW (ref 98–111)
Creatinine, Ser: 0.72 mg/dL (ref 0.44–1.00)
GFR, Estimated: >60 mL/min (ref >60)
Glucose, Bld: 135 mg/dL — ABNORMAL HIGH (ref 70–99)
Potassium: 3.4 mmol/L — ABNORMAL LOW (ref 3.5–5.1)
Sodium: 130 mmol/L — ABNORMAL LOW (ref 135–145)
Total Bilirubin: 1 mg/dL (ref 0.3–1.2)
Total Protein: 6.4 g/dL — ABNORMAL LOW (ref 6.5–8.1)

## 2021-03-24 LAB — OSMOLALITY, URINE: Osmolality, Ur: 362 mOsm/kg (ref 300–900)

## 2021-03-24 LAB — MAGNESIUM: Magnesium: 1.8 mg/dL (ref 1.7–2.4)

## 2021-03-24 LAB — BRAIN NATRIURETIC PEPTIDE: B Natriuretic Peptide: 98.6 pg/mL (ref 0.0–100.0)

## 2021-03-24 LAB — SODIUM, URINE, RANDOM: Sodium, Ur: 122 mmol/L

## 2021-03-24 LAB — CREATININE, URINE, RANDOM: Creatinine, Urine: 53.64 mg/dL

## 2021-03-24 MED ORDER — POTASSIUM CHLORIDE CRYS ER 20 MEQ PO TBCR
40.0000 meq | EXTENDED_RELEASE_TABLET | Freq: Once | ORAL | Status: AC
  Administered 2021-03-24: 40 meq via ORAL
  Filled 2021-03-24: qty 2

## 2021-03-24 MED ORDER — ZOLPIDEM TARTRATE 5 MG PO TABS
5.0000 mg | ORAL_TABLET | Freq: Once | ORAL | Status: AC
  Administered 2021-03-24: 5 mg via ORAL
  Filled 2021-03-24: qty 1

## 2021-03-24 NOTE — NC FL2 (Addendum)
[place] MEDICAID FL2 LEVEL OF CARE SCREENING TOOL     IDENTIFICATION  Patient Name: [name] Birthdate: 1956-05-18 Sex: female Admission Date (Current Location): 03/21/2021  [facility] and [place] Number:  Herbalist and Address:  [facility], [place] [address] [place],  [address]      Provider Number: [id number]  Attending Physician Name and Address:  Thurnell Lose, MD  Relative Name and Phone Number:  [name] [facility]) [phone]    Current Level of Care: Hospital Recommended Level of Care: [place] Prior Approval Number:    Date Approved/Denied:   PASRR Number: [id number] F, expires 04/28/21  Discharge Plan:      Current Diagnoses: Patient Active Problem List   Diagnosis Date Noted   Gait disturbance 03/22/2021   Falls frequently 03/22/2021   Alcohol abuse [id number]   Alcoholic cirrhosis ([place]) [id number]   Ataxia 03/22/2020   Alcohol withdrawal ([place]) 10/09/2015   Nausea, vomiting and diarrhea 10/07/2015   Diarrhea 10/07/2015   Anxiety and depression    Acute ischemic stroke [facility])    Left leg weakness 06/13/2015   TIA (transient ischemic attack) 06/13/2015   Hyponatremia 06/13/2015   Cornea scar 09/24/2012   H/O corneal ulcer 09/24/2012   Bronchitis 06/14/2012   Error, refractive, myopia 02/29/2012   Presbyopia 01/25/2012   H/O ETOH abuse 09/13/2011   DYSURIA 10/22/2009   PROTEINURIA, MILD 04/21/2009   ABNORMAL FINDINGS GI TRACT 12/03/2007   Essential hypertension 10/22/2007    Orientation RESPIRATION BLADDER Height & Weight     Self, Time, Place  Normal Incontinent, External catheter Weight: 104 lb 11.5 oz (47.5 kg) Height:  5' (152.4 cm)  BEHAVIORAL SYMPTOMS/MOOD NEUROLOGICAL BOWEL NUTRITION STATUS      Incontinent Diet (see dc summary)  AMBULATORY STATUS COMMUNICATION OF NEEDS Skin   Limited Assist Verbally Skin abrasions (Knee anterior;Right abrasion)                        Personal Care Assistance Level of Assistance  Bathing, Feeding, Dressing Bathing Assistance: Limited assistance Feeding assistance: Independent Dressing Assistance: Limited assistance     Functional Limitations Info  Sight, Speech, Hearing   Hearing Info: Impaired Speech Info: Adequate    SPECIAL CARE FACTORS FREQUENCY  PT (By licensed PT), OT (By licensed OT)     PT Frequency: 5x per week OT Frequency: 5x per week            Contractures Contractures Info: Not present    Additional Factors Info  Code Status, Allergies, Psychotropic Code Status Info: Full code Allergies Info: Codeine Psychotropic Info: LORazepam (ATIVAN)         Current Medications (03/24/2021):  This is the current hospital active medication list Current Facility-Administered Medications  Medication Dose Route Frequency Provider Last Rate Last Admin   acetaminophen (TYLENOL) tablet 650 mg  650 mg Oral Q6H PRN Norval Morton, MD       Or   acetaminophen (TYLENOL) suppository 650 mg  650 mg Rectal Q6H PRN Fuller Plan A, MD       albuterol (PROVENTIL) (2.5 MG/3ML) 0.083% nebulizer solution 2.5 mg  2.5 mg Nebulization Q6H PRN Smith, Rondell A, MD       chlordiazePOXIDE (LIBRIUM) capsule 10 mg  10 mg Oral TID Thurnell Lose, MD   10 mg at 03/24/21 0944   cyanocobalamin ((VITAMIN B-12)) injection 1,000 mcg  1,000 mcg Subcutaneous Daily Thurnell Lose, MD  1,000 mcg at 03/24/21 0950   enoxaparin (LOVENOX) injection 40 mg  40 mg Subcutaneous Q24H Fuller Plan A, MD   40 mg at [id number]   folic acid (FOLVITE) tablet 1 mg  1 mg Oral Daily Tamala Julian, Rondell A, MD   1 mg at 03/24/21 0944   hydrALAZINE (APRESOLINE) injection 10 mg  10 mg Intravenous Q4H PRN Fuller Plan A, MD   10 mg at 03/22/21 1522   lisinopril (ZESTRIL) tablet 40 mg  40 mg Oral Daily Smith, Rondell A, MD   40 mg at 03/24/21 0944   LORazepam (ATIVAN) injection 0-4 mg  0-4 mg Intravenous Q12H Horton, Barbette Hair, MD       Or    LORazepam (ATIVAN) tablet 0-4 mg  0-4 mg Oral Q12H Horton, Barbette Hair, MD       LORazepam (ATIVAN) tablet 1-4 mg  1-4 mg Oral Q1H PRN Fuller Plan A, MD       Or   LORazepam (ATIVAN) injection 1-4 mg  1-4 mg Intravenous Q1H PRN Fuller Plan A, MD   2 mg at 03/23/21 1019   metoprolol succinate (TOPROL-XL) 24 hr tablet 100 mg  100 mg Oral Daily Tamala Julian, Rondell A, MD   100 mg at 03/24/21 0944   multivitamin with minerals tablet 1 tablet  1 tablet Oral Daily Fuller Plan A, MD   1 tablet at 03/24/21 0944   pantoprazole (PROTONIX) EC tablet 40 mg  40 mg Oral Daily Thurnell Lose, MD   40 mg at 03/24/21 0945   sodium chloride flush (NS) 0.9 % injection 3 mL  3 mL Intravenous Q12H Smith, Rondell A, MD   3 mL at 03/24/21 0957   thiamine '500mg'$  in normal saline (59m) IVPB  500 mg Intravenous Q24H Smith, Rondell A, MD       Or   thiamine tablet 500 mg  500 mg Oral Q24H STamala Julian Rondell A, MD   500 mg at 03/24/21 0944   [START ON 03/30/2021] vitamin B-12 (CYANOCOBALAMIN) tablet 1,000 mcg  1,000 mcg Oral BID SThurnell Lose MD         Discharge Medications: Please see discharge summary for a list of discharge medications.  Relevant Imaging Results:  Relevant Lab Results:   Additional Information SSN# [id number] COVID-19 Immun[place] 06/28/2020, 10/25/2019,09/30/2019  Mya D WGilford Rile Student-Social Work

## 2021-03-24 NOTE — Progress Notes (Signed)
Physical Therapy Treatment Patient Details Name: [name] MRN: [id number] DOB: [birth year] Today's Date: 03/24/2021   History of Present Illness Pt presented to ED on 9/12 with etoh abuse with withdrawals and frequent falls at home. Pt also with hyponatremia. 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    PT Comments    Patient alert on arrival. Slightly impulsive as she began to sit up OOB as soon as I told her the reason for PT. Able to be directed and follow instructions. Walks with flexed posture and very wide BOS with pt stepping outside back legs of RW x 1 during turning and required assist for balance and maneuvering RW.     Recommendations for follow up therapy are one component of a multi-disciplinary discharge planning process, led by the attending physician.  Recommendations may be updated based on patient status, additional functional criteria and insurance authorization.  Follow Up Recommendations  SNF     Equipment Recommendations  None recommended by PT    Recommendations for Other Services       Precautions / Restrictions Precautions Precautions: Fall     Mobility  Bed Mobility Overal bed mobility: Needs Assistance Bed Mobility: Supine to Sit;Sit to Supine     Supine to sit: Min guard;HOB elevated Sit to supine: Min guard;HOB elevated   General bed mobility comments: Assist for safety and verbal cues for sequencing    Transfers Overall transfer level: Needs assistance Equipment used: Rolling walker (2 wheeled) Transfers: Sit to/from Stand Sit to Stand: Min assist         General transfer comment: assist for powerup and stability in standing; cues for proper hand placement and assist to stabilize RW  Ambulation/Gait Ambulation/Gait assistance: Min assist Gait Distance (Feet): 30 Feet (seated rest; 30 ft) Assistive device: Rolling walker (2 wheeled) Gait Pattern/deviations: Step-through pattern;Decreased step length -  left;Decreased step length - right;Wide base of support;Trunk flexed Gait velocity: decr   General Gait Details: Assist for balance. Flexed knees throughout. Assist to manage walker.   Stairs             Wheelchair Mobility    Modified Rankin (Stroke Patients Only)       Balance Overall balance assessment: Needs assistance Sitting-balance support: No upper extremity supported;Feet supported Sitting balance-Leahy Scale: Fair     Standing balance support: Bilateral upper extremity supported Standing balance-Leahy Scale: Poor Standing balance comment: heavy reliance on BUE support in standing and assist for stability from therapist                            Cognition Arousal/Alertness: Awake/alert Behavior During Therapy: Impulsive Overall Cognitive Status: Impaired/Different from baseline Area of Impairment: Orientation;Attention;Memory;Safety/judgement;Following commands;Problem solving;Awareness                 Orientation Level: Disoriented to;Situation;Time Current Attention Level: Sustained Memory: Decreased short-term memory Following Commands: Follows one step commands with increased time Safety/Judgement: Decreased awareness of safety;Decreased awareness of deficits Awareness: Intellectual Problem Solving: Slow processing;Difficulty sequencing;Requires verbal cues;Requires tactile cues General Comments: pt with decreased safety awareness, slightly confused and required increased time for processing information      Exercises      General Comments General comments (skin integrity, edema, etc.): VSS on RA      Pertinent Vitals/Pain Pain Assessment: No/denies pain    Home Living Family/patient expects to be discharged to:: Private residence Living Arrangements: Alone   Type of Home: Other(Comment)  Home Layout: Two level;Bed/bath upstairs;1/2 bath on main level Home Equipment: Cane - single point;Walker - 2 wheels       Prior Function Level of Independence: Needs assistance  Gait / Transfers Assistance Needed: Pt uses walker or cane at home. Per PT eval, Neighbor or friend reports pt wasn't able to go up the stairs in her condo prior to admission ADL's / Homemaking Assistance Needed: Pt reports she was independent with ADL     PT Goals (current goals can now be found in the care plan section) Acute Rehab PT Goals Patient Stated Goal: didn't state Time For Goal Achievement: 04/05/21 Potential to Achieve Goals: Good Progress towards PT goals: Progressing toward goals    Frequency    Min 3X/week      PT Plan Current plan remains appropriate    Co-evaluation              AM-PAC PT "6 Clicks" Mobility   Outcome Measure  Help needed turning from your back to your side while in a flat bed without using bedrails?: A Little Help needed moving from lying on your back to sitting on the side of a flat bed without using bedrails?: A Little Help needed moving to and from a bed to a chair (including a wheelchair)?: A Little Help needed standing up from a chair using your arms (e.g., wheelchair or bedside chair)?: A Little Help needed to walk in hospital room?: A Little Help needed climbing 3-5 steps with a railing? : Total 6 Click Score: 16    End of Session Equipment Utilized During Treatment: Gait belt Activity Tolerance: Patient tolerated treatment well Patient left: in bed;with call bell/phone within reach;with bed alarm set (Chair alarm in room broken; safest for pt to return to bed with bed alarm) Nurse Communication: Mobility status PT Visit Diagnosis: Unsteadiness on feet (R26.81);Repeated falls (R29.6);Ataxic gait (R26.0)     Time: [id number] PT Time Calculation (min) (ACUTE ONLY): 28 min  Charges:  $Gait Training: 23-37 mins                      [name], PT Pager [phone]    [name] 03/24/2021, 3:43 PM

## 2021-03-24 NOTE — TOC Initial Note (Signed)
Transition of Care [facility]) - Initial/Assessment Note    Patient Details  Name: [name] MRN: [id number] Date of Birth: Nov 18, 1955  Transition of Care [facility]) CM/SW Contact:    Verdell Carmine, RN Phone Number: 03/24/2021, 8:19 AM  Clinical Narrative:                  65 year old patient admitted with  history of depression, anxiety ETOH, drinks 4-5 beers per day declined admission to [facility], in to Detox and find placement, PT and OT recommending SNF. , as she has had frequent falls andi unable to care for self at home. Lives in a apartment alone, has friend. Has had some cognitive limitations here, incontinence, not knowing that she was incontinent. CSW has placed consult to financial counseling for Med assist.   The patient will be a challenge to seek placement due to factors of SA, anxiety. . CM will follow for needs, recommendations and transitions.   Expected Discharge Plan: Skilled Nursing Facility Barriers to Discharge: Continued Medical Work up   Patient Goals and CMS Choice        Expected Discharge Plan and Services Expected Discharge Plan: [place] In-house Referral: Clinical Social Work Discharge Planning Services: CM Consult   Living arrangements for the past 2 months: Apartment                                      Prior Living Arrangements/Services Living arrangements for the past 2 months: Apartment Lives with:: Self Patient language and need for interpreter reviewed:: Yes        Need for Family Participation in Patient Care: Yes (Comment) Care giver support system in place?: Yes (comment)   Criminal Activity/Legal Involvement Pertinent to Current Situation/Hospitalization: No - Comment as needed  Activities of Daily Living Home Assistive Devices/Equipment: Gilford Rile (specify type) ADL Screening (condition at time of admission) Patient's cognitive ability adequate to safely complete daily activities?: No Is the patient  deaf or have difficulty hearing?: Yes Does the patient have difficulty seeing, even when wearing glasses/contacts?: No Does the patient have difficulty concentrating, remembering, or making decisions?: Yes Patient able to express need for assistance with ADLs?: Yes Does the patient have difficulty dressing or bathing?: Yes Independently performs ADLs?: No Communication: Independent Dressing (OT): Needs assistance Is this a change from baseline?: Change from baseline, expected to last >3 days Grooming: Needs assistance Is this a change from baseline?: Pre-admission baseline Feeding: Independent Bathing: Needs assistance Is this a change from baseline?: Change from baseline, expected to last >3 days Toileting: Needs assistance Is this a change from baseline?: Change from baseline, expected to last >3days In/Out Bed: Needs assistance Is this a change from baseline?: Change from baseline, expected to last >3 days Walks in Home: Needs assistance Is this a change from baseline?: Change from baseline, expected to last >3 days Does the patient have difficulty walking or climbing stairs?: Yes Weakness of Legs: Both Weakness of Arms/Hands: Both  Permission Sought/Granted                  Emotional Assessment       Orientation: : Oriented to Self, Oriented to Place Alcohol / Substance Use: Alcohol Use Psych Involvement: No (comment) (ETOH)  Admission diagnosis:  Alcohol withdrawal ([place]) [F10.239] Alcohol abuse [F10.10] Hyponatremia [E87.1] Physical deconditioning [R53.81] Patient Active Problem List   Diagnosis Date Noted  Gait disturbance 03/22/2021   Falls frequently 03/22/2021   Alcohol abuse [id number]   Alcoholic cirrhosis ([place]) A999333   Ataxia 03/22/2020   Alcohol withdrawal ([place]) 10/09/2015   Nausea, vomiting and diarrhea 10/07/2015   Diarrhea 10/07/2015   Anxiety and depression    Acute ischemic stroke [facility])    Left leg weakness 06/13/2015   TIA (transient  ischemic attack) 06/13/2015   Hyponatremia 06/13/2015   Cornea scar 09/24/2012   H/O corneal ulcer 09/24/2012   Bronchitis 06/14/2012   Error, refractive, myopia 02/29/2012   Presbyopia 01/25/2012   H/O ETOH abuse 09/13/2011   DYSURIA 10/22/2009   PROTEINURIA, MILD 04/21/2009   ABNORMAL FINDINGS GI TRACT 12/03/2007   Essential hypertension 10/22/2007   PCP:  Ann Held, DO Pharmacy:   [facility] [place] [place] [facility] [place] [address] Phone: [phone] Fax: [phone]  [id number] [place]Phone: [phone]: [phone] [id number] [place]: [phone]: [phone]   Social Determinants of Health ([place]ventions    Readmission Risk Interventions No flowsheet data found.

## 2021-03-24 NOTE — Progress Notes (Signed)
PROGRESS NOTE                                                                                                                                                                                                             Patient Demographics:    [name], is a 65 y.o. female, DOB - 03-23-1956, [id number]  Outpatient Primary MD for the patient is Ann Held, DO    LOS - 2  Admit date - 03/21/2021    Chief Complaint  Patient presents with   Alcohol Problem       Brief Narrative (HPI from H&P)  -  [name] is a 65 y.o. female with medical history significant of hypertension, alcohol abuse, anxiety, and depression presents with what she reports is an alcohol problem.  Admits to drinking 4-5 beers per day on average and intermittently drinking wine for over 30 years.  Her last drink was 2 days ago.  Her friend Daine Floras was able to provide additional history over the phone. Her friend tried to take her to [facility] in [place] for alcohol rehab, but she was declined admission as she was significantly debilitated needing assistance, in the ER she was found to be in early DTs, had ataxia and difficulty in standing up and ambulating.  She was admitted for further care.   Subjective:   Patient in bed, appears comfortable, denies any headache, no fever, no chest pain or pressure, no shortness of breath , no abdominal pain. No new focal weakness.    Assessment  & Plan :     Alcohol abuse with early DTs.-Counseled to quit alcohol, CIWA protocol.  Will add scheduled Librium as well.  2.  Multiple falls with some ataxia.  MRI brain nonacute however limited due to motion artifact, continue PT OT, continue replacement of thiamine and B12, B12 levels were borderline, thiamine levels were added to blood work however she has received some supplemental thiamine by that time.  Could have  early Wernicke's encephalopathy as well however she has minimal to no nystagmus at this time.  Continue to monitor with [id number], folic acid and thiamine supplementation.  Will likely require SNF.  3.  Hyponatremia.  Improved with hydration, monitor..  4.  Hypertension.  Blood pressure medications adjusted for better control.  5.  Possible underlying  alcoholic cirrhosis.  Ultrasound noted.  Acute hepatitis panel will be added.  For now stable INR and LFTs.  Outpatient GI follow-up      Condition - Fair  Family Communication  :  None present  Code Status :  Full  Consults  :  None  PUD Prophylaxis : PPI   Procedures  :     RUQ [place].  Possible cirrhosis  MRI brain - nonacute.      Disposition Plan  :    Status is: Inpatient  Remains inpatient appropriate because:IV treatments appropriate due to intensity of illness or inability to take PO  Dispo: The patient is from: Home              Anticipated d/c is to: SNF              Patient currently is not medically stable to d/c.   Difficult to place patient No  DVT Prophylaxis  :    enoxaparin (LOVENOX) injection 40 mg Start: 03/22/21 1600    Lab Results  Component Value Date   PLT 266 03/24/2021    Diet :  Diet Order             Diet Heart Room service appropriate? Yes; Fluid consistency: Thin  Diet effective now                    Inpatient Medications  Scheduled Meds:  chlordiazePOXIDE  10 mg Oral TID   cyanocobalamin  1,000 mcg Subcutaneous Daily   enoxaparin (LOVENOX) injection  40 mg Subcutaneous [id number]   folic acid  1 mg Oral Daily   lisinopril  40 mg Oral Daily   LORazepam  0-4 mg Intravenous Q12H   Or   LORazepam  0-4 mg Oral Q12H   metoprolol succinate  100 mg Oral Daily   multivitamin with minerals  1 tablet Oral Daily   pantoprazole  40 mg Oral Daily   sodium chloride flush  3 mL Intravenous Q12H   thiamine  500 mg Oral Q24H   [START ON 03/30/2021] vitamin B-12  1,000 mcg Oral BID   Continuous  Infusions:  thiamine injection     PRN Meds:.acetaminophen **OR** acetaminophen, albuterol, hydrALAZINE, LORazepam **OR** LORazepam  Antibiotics  :    Anti-infectives (From admission, onward)    None        Time Spent in minutes  30   Lala Lund M.D on 03/24/2021 at 11:23 AM  To page go to [email or web address]   [facility] -  Office  [phone]    See all Orders from today for further details    Objective:   Vitals:   03/23/21 2353 03/24/21 0139 03/24/21 0343 03/24/21 0745  BP: (!) 154/98 (!) 148/97 (!) 167/82 (!) 143/94  Pulse: 77  67 91  Resp: '15  17 15  '$ Temp: (!) 97.5 F (36.4 C)  98.4 F (36.9 C) 98.2 F (36.8 C)  TempSrc: Oral  Oral Oral  SpO2: 100%  100% 100%  Weight:      Height:        Wt Readings from Last 3 Encounters:  03/22/21 47.5 kg  03/18/21 51.8 kg  12/27/20 50.8 kg     Intake/Output Summary (Last 24 hours) at 03/24/2021 1123 Last data filed at 03/24/2021 0647 Gross per 24 hour  Intake 120 ml  Output 905 ml  Net -785 ml     Physical Exam  Awake mildly confused, No new F.N deficits, Normal affect  .AT,PERRAL Supple Neck,No JVD, No cervical lymphadenopathy appriciated.  Symmetrical Chest wall movement, Good air movement bilaterally, CTAB RRR,No Gallops, Rubs or new Murmurs, No Parasternal Heave +ve B.Sounds, Abd Soft, No tenderness, No organomegaly appriciated, No rebound - guarding or rigidity. No Cyanosis, Clubbing or edema, No new Rash or bruise    Data Review:    CBC Recent Labs  Lab 03/18/21 1128 03/21/21 1524 03/23/21 0115 03/24/21 0114  WBC 5.6 5.1 6.9 5.3  HGB 13.5 14.2 14.4 13.5  HCT 39.5 41.2 39.3 37.4  PLT 339.0 358 289 266  MCV 111.0 Repeated and verified X2.* 107.0* 104.0* 104.8*  MCH  --  36.9* 38.1* 37.8*  MCHC 34.2 34.5 36.6* 36.1*  RDW 13.7 12.6 12.3 12.0  LYMPHSABS 1.4 1.2  --  1.0  MONOABS 0.7 0.5  --  0.6  EOSABS 0.1 0.1  --  0.1  BASOSABS 0.1 0.1  --  0.0    Recent Labs  Lab  03/18/21 1128 03/21/21 1524 03/22/21 1416 03/22/21 1420 03/23/21 0115 03/23/21 1450 03/24/21 0114  NA 131* 130*  --   --  129*  --  130*  K 4.7 3.9  --   --  3.4*  --  3.4*  CL 93* 91*  --   --  91*  --  95*  CO2 26 25  --   --  24  --  24  GLUCOSE 69* 121*  --   --  99  --  135*  BUN 6 <5*  --   --  <5*  --  <5*  CREATININE 0.67 0.66  --   --  0.53  --  0.72  CALCIUM 10.0 9.1  --   --  9.6  --  9.6  AST 30 38  --   --   --   --  45*  ALT 21 23  --   --   --   --  24  ALKPHOS 98 89  --   --   --   --  71  BILITOT 0.7 0.3  --   --   --   --  1.0  ALBUMIN 4.4 4.3  --   --   --   --  3.8  MG  --   --   --  1.7  --   --  1.8  INR  --   --   --  1.0  --   --   --   TSH 1.75  --   --  1.282  --   --   --   AMMONIA 22  --  12  --   --   --   --   BNP  --   --   --   --   --  84.1 98.6    ------------------------------------------------------------------------------------------------------------------ No results for input(s): CHOL, HDL, LDLCALC, TRIG, CHOLHDL, LDLDIRECT in the last 72 hours.  Lab Results  Component Value Date   HGBA1C 5.2 06/14/2015   ------------------------------------------------------------------------------------------------------------------ Recent Labs    03/22/21 1420  TSH 1.282    Cardiac Enzymes No results for input(s): CKMB, TROPONINI, MYOGLOBIN in the last 168 hours.  Invalid input(s): CK ------------------------------------------------------------------------------------------------------------------    Component Value Date/Time   BNP 98.6 03/24/2021 0114    Micro Results No results found for this or any previous visit (from the past 240 hour(s)).  Radiology Reports MR BRAIN WO CONTRAST  Result Date: 03/23/2021 CLINICAL DATA:  Ataxia, question Wernicke's encephalopathy EXAM: MRI  HEAD WITHOUT CONTRAST TECHNIQUE: Multiplanar, multiecho pulse sequences of the brain and surrounding structures were obtained without intravenous contrast.  COMPARISON:  CT head 02/21/2021 FINDINGS: The study is limited due to patient cooperation. The provided sequences are moderately motion degraded. Brain: There is no evidence of acute intracranial hemorrhage, extra-axial fluid collection, or infarct. There is marked global parenchymal volume loss with enlargement of the ventricular system, advanced for age. There is no discernible lobar predominance. Confluent T2/FLAIR hyperintensity in the subcortical and periventricular white matter likely reflects sequela of chronic white matter microangiopathy. There are no findings of Wernicke's encephalopathy, though the mamillary bodies are not well evaluated. Vascular: Normal flow voids. Skull and upper cervical spine: Normal marrow signal. Sinuses/Orbits: The paranasal sinuses are clear. The globes and orbits are unremarkable. Other: None. IMPRESSION: 1. Motion degraded study as above. 2. No evidence of acute intracranial pathology. Specifically, no definite evidence of Wernicke's encephalopathy, though the mamillary bodies are not well evaluated. Consider repeat imaging with intravenous contrast when the patient can tolerate, if indicated. 3. Marked global parenchymal volume loss without discernible lobar predominance, advanced for age. Electronically Signed   By: Valetta Mole M.D.   On: 03/23/2021 11:43   US Abdomen Limited RUQ (LIVER/GB)  Result Date: 03/21/2021 CLINICAL DATA:  alcoholic cirrhosis EXAM: ULTRASOUND ABDOMEN LIMITED RIGHT UPPER QUADRANT COMPARISON:  None. FINDINGS: Gallbladder: The gallbladder is nondilated without visible intraluminal stones. There is a non dependent, nonshadowing 4 mm nodular focus along the hepatic side gallbladder wall, which could be adherent sludge or small polyp, the latter of which would require no follow-up per consensus guidelines. Common bile duct: Diameter: 9 mm.  No obstructing lesion identified sonographically. Liver: Diffusely increased liver echogenicity with mildly  nodular contours. Portal vein is patent on color Doppler imaging with normal direction of blood flow towards the liver. Other: None. IMPRESSION: Hepatic steatosis with mildly nodular liver contours, suggesting cirrhosis. No ascites. Dilated common bile duct measuring 9 mm, previously 10 mm on prior ultrasound in October 2021. If not already performed, MRI/MRCP is recommended for further evaluation. Electronically Signed   By: Maurine Simmering M.D.   On: 03/21/2021 11:56

## 2021-03-25 DIAGNOSIS — I10 Essential (primary) hypertension: Secondary | ICD-10-CM | POA: Diagnosis not present

## 2021-03-25 DIAGNOSIS — F419 Anxiety disorder, unspecified: Secondary | ICD-10-CM | POA: Diagnosis not present

## 2021-03-25 DIAGNOSIS — R296 Repeated falls: Secondary | ICD-10-CM | POA: Diagnosis not present

## 2021-03-25 DIAGNOSIS — F1010 Alcohol abuse, uncomplicated: Secondary | ICD-10-CM | POA: Diagnosis not present

## 2021-03-25 LAB — CBC WITH DIFFERENTIAL/PLATELET
Abs Immature Granulocytes: 0.04 10*3/uL (ref 0.00–0.07)
Basophils Absolute: 0.1 10*3/uL (ref 0.0–0.1)
Basophils Relative: 1 %
Eosinophils Absolute: 0.1 10*3/uL (ref 0.0–0.5)
Eosinophils Relative: 2 %
HCT: 35.2 % — ABNORMAL LOW (ref 36.0–46.0)
Hemoglobin: 12.7 g/dL (ref 12.0–15.0)
Immature Granulocytes: 1 %
Lymphocytes Relative: 25 %
Lymphs Abs: 1.4 10*3/uL (ref 0.7–4.0)
MCH: 37.7 pg — ABNORMAL HIGH (ref 26.0–34.0)
MCHC: 36.1 g/dL — ABNORMAL HIGH (ref 30.0–36.0)
MCV: 104.5 fL — ABNORMAL HIGH (ref 80.0–100.0)
Monocytes Absolute: 0.9 10*3/uL (ref 0.1–1.0)
Monocytes Relative: 15 %
Neutro Abs: 3.3 10*3/uL (ref 1.7–7.7)
Neutrophils Relative %: 56 %
Platelets: 268 10*3/uL (ref 150–400)
RBC: 3.37 MIL/uL — ABNORMAL LOW (ref 3.87–5.11)
RDW: 12.1 % (ref 11.5–15.5)
WBC: 5.8 10*3/uL (ref 4.0–10.5)
nRBC: 0 % (ref 0.0–0.2)

## 2021-03-25 LAB — COMPREHENSIVE METABOLIC PANEL
ALT: 27 U/L (ref 0–44)
AST: 38 U/L (ref 15–41)
Albumin: 3.4 g/dL — ABNORMAL LOW (ref 3.5–5.0)
Alkaline Phosphatase: 55 U/L (ref 38–126)
Anion gap: 8 (ref 5–15)
BUN: <5 mg/dL — ABNORMAL LOW (ref 8–23)
CO2: 26 mmol/L (ref 22–32)
Calcium: 9.2 mg/dL (ref 8.9–10.3)
Chloride: 93 mmol/L — ABNORMAL LOW (ref 98–111)
Creatinine, Ser: 0.66 mg/dL (ref 0.44–1.00)
GFR, Estimated: >60 mL/min (ref >60)
Glucose, Bld: 99 mg/dL (ref 70–99)
Potassium: 3.7 mmol/L (ref 3.5–5.1)
Sodium: 127 mmol/L — ABNORMAL LOW (ref 135–145)
Total Bilirubin: 0.8 mg/dL (ref 0.3–1.2)
Total Protein: 5.9 g/dL — ABNORMAL LOW (ref 6.5–8.1)

## 2021-03-25 LAB — MAGNESIUM: Magnesium: 1.6 mg/dL — ABNORMAL LOW (ref 1.7–2.4)

## 2021-03-25 LAB — BRAIN NATRIURETIC PEPTIDE: B Natriuretic Peptide: 82.3 pg/mL (ref 0.0–100.0)

## 2021-03-25 LAB — SARS CORONAVIRUS 2 (TAT 6-24 HRS): SARS Coronavirus 2: NEGATIVE

## 2021-03-25 MED ORDER — AMLODIPINE BESYLATE 10 MG PO TABS
10.0000 mg | ORAL_TABLET | Freq: Every day | ORAL | Status: DC
  Administered 2021-03-25 – 2021-03-29 (×5): 10 mg via ORAL
  Filled 2021-03-25 (×6): qty 1

## 2021-03-25 MED ORDER — LORAZEPAM 2 MG/ML IJ SOLN: 1.0000 mg | INTRAMUSCULAR | Status: DC | PRN

## 2021-03-25 MED ORDER — LORAZEPAM 1 MG PO TABS
1.0000 mg | ORAL_TABLET | ORAL | Status: DC | PRN
  Administered 2021-03-26: 1 mg via ORAL
  Filled 2021-03-25 (×2): qty 1

## 2021-03-25 MED ORDER — LACTATED RINGERS IV SOLN: INTRAVENOUS | Status: AC

## 2021-03-25 MED ORDER — MAGNESIUM SULFATE 2 GM/50ML IV SOLN
2.0000 g | Freq: Once | INTRAVENOUS | Status: AC
  Administered 2021-03-25: 2 g via INTRAVENOUS
  Filled 2021-03-25: qty 50

## 2021-03-25 MED ORDER — MELATONIN 5 MG PO TABS
5.0000 mg | ORAL_TABLET | Freq: Every evening | ORAL | Status: DC | PRN
  Administered 2021-03-25 – 2021-04-08 (×14): 5 mg via ORAL
  Filled 2021-03-25 (×14): qty 1

## 2021-03-25 NOTE — Progress Notes (Signed)
Occupational Therapy Treatment Patient Details Name: [name] MRN: [id number] DOB: Sep 09, 1955 Today's Date: 03/25/2021   History of present illness Pt presented to ED on 9/12 with etoh abuse with withdrawals and frequent falls at home. Pt also with hyponatremia. 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   OT comments  Pt received in recliner, agreeable to OT session. Pt currently requires modA for functional mobility at RW level, requiring modA to navigate around obstacles, cues for sequencing and assistance for stability. Pt demonstrates decreased awareness of deficits, need for assistance and decreased safety awareness. She required minA for stability while standing at sink level. Pt began to pour cup of water on her head in attempts to "fix her hair". Pt will continue to benefit from skilled OT services to maximize safety and independence with ADL/IADL and functional mobility. Will continue to follow acutely and progress as tolerated.     Recommendations for follow up therapy are one component of a multi-disciplinary discharge planning process, led by the attending physician.  Recommendations may be updated based on patient status, additional functional criteria and insurance authorization.    Follow Up Recommendations  SNF;Supervision/Assistance - 24 hour    Equipment Recommendations  3 in 1 bedside commode    Recommendations for Other Services      Precautions / Restrictions Precautions Precautions: Fall Restrictions Weight Bearing Restrictions: No       Mobility Bed Mobility Overal bed mobility: Needs Assistance Bed Mobility: Sit to Supine       Sit to supine: Min guard;HOB elevated   General bed mobility comments: minguard for safety and line management    Transfers Overall transfer level: Needs assistance Equipment used: Rolling walker (2 wheeled) Transfers: Sit to/from Stand Sit to Stand: Min assist         General transfer comment:  minA to powerup into standing;pt with wide base of support    Balance Overall balance assessment: Needs assistance Sitting-balance support: No upper extremity supported;Feet supported Sitting balance-Leahy Scale: Fair     Standing balance support: Single extremity supported;During functional activity Standing balance-Leahy Scale: Poor Standing balance comment: heavy reliance on at least single UE support and support from therapist                           ADL either performed or assessed with clinical judgement   ADL Overall ADL's : Needs assistance/impaired     Grooming: Minimal assistance;Standing Grooming Details (indicate cue type and reason): assist for stability with completion of oral care, hair care while standing at sink level;             Lower Body Dressing: Minimal assistance;Sit to/from stand Lower Body Dressing Details (indicate cue type and reason): assist to access feet and for strength and stability with standing Toilet Transfer: Moderate assistance;RW;BSC;Cueing for sequencing;Cueing for safety Toilet Transfer Details (indicate cue type and reason): cues for DME management, safety and stability, pt attempting to sit prematurely. Toileting- Clothing Manipulation and Hygiene: Moderate assistance;Sit to/from stand Toileting - Clothing Manipulation Details (indicate cue type and reason): modA for safe hand placement and posterior care     Functional mobility during ADLs: Moderate assistance;Rolling walker General ADL Comments: modA for safety, sequencing and stability;pt requiring more assistance this date with progession with in room mobility. Pt with decreased awarness of deficits and decreased safety awareness.     Vision       Perception     Praxis  Cognition Arousal/Alertness: Awake/alert Behavior During Therapy: Impulsive Overall Cognitive Status: Impaired/Different from baseline Area of Impairment:  Orientation;Attention;Memory;Safety/judgement;Following commands;Problem solving;Awareness                 Orientation Level: Disoriented to;Situation;Time Current Attention Level: Sustained Memory: Decreased short-term memory Following Commands: Follows one step commands with increased time Safety/Judgement: Decreased awareness of safety;Decreased awareness of deficits Awareness: Intellectual Problem Solving: Slow processing;Difficulty sequencing;Requires verbal cues;Requires tactile cues General Comments: pt with decreased safety awareness, slightly confused and required increased time for processing information. pt required moderate assistance for use of DME and maxA to sequence how to navigate around obstacles. While washing up at sink, pt took cup for oral care and began to dump it on her head, pt reports she was attempting to fix her hair.        Exercises     Shoulder Instructions       General Comments vss HR 87bpm with standing at sink level, SpO2 >98% RA    Pertinent Vitals/ Pain       Pain Assessment: No/denies pain  Home Living                                          Prior Functioning/Environment              Frequency  Min 2X/week        Progress Toward Goals  OT Goals(current goals can now be found in the care plan section)  Progress towards OT goals: Progressing toward goals  Acute Rehab OT Goals Patient Stated Goal: didn't state OT Goal Formulation: With patient Time For Goal Achievement: 04/06/21 Potential to Achieve Goals: Good ADL Goals Pt Will Perform Grooming: standing;with supervision Pt Will Perform Lower Body Dressing: with supervision;sit to/from stand Pt Will Transfer to Toilet: with supervision;ambulating Additional ADL Goal #1: Pt will demonstrate anticipatory awareness for safe completion of ADL/IADL.  Plan Discharge plan remains appropriate    Co-evaluation                 AM-PAC OT "6 Clicks"  Daily Activity     Outcome Measure   Help from another person eating meals?: A Little Help from another person taking care of personal grooming?: A Little Help from another person toileting, which includes using toliet, bedpan, or urinal?: A Lot Help from another person bathing (including washing, rinsing, drying)?: A Little Help from another person to put on and taking off regular upper body clothing?: A Little Help from another person to put on and taking off regular lower body clothing?: A Lot 6 Click Score: 16    End of Session Equipment Utilized During Treatment: Gait belt;Rolling walker  OT Visit Diagnosis: Unsteadiness on feet (R26.81);History of falling (Z91.81);Muscle weakness (generalized) (M62.81)   Activity Tolerance Patient tolerated treatment well   Patient Left in bed;with call bell/phone within reach;with bed alarm set   Nurse Communication Mobility status        Time: [id number] OT Time Calculation (min): 23 min  Charges: OT General Charges $OT Visit: 1 Visit OT Treatments $Self Care/Home Management : 23-37 mins  Helene Kelp OTR/L Acute Rehabilitation Services Office: [place] 03/25/2021, 3:32 PM
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
## 2021-03-25 NOTE — Progress Notes (Addendum)
PROGRESS NOTE                                                                                                                                                                                                             Patient Demographics:    [name], is a 65 y.o. female, DOB - 1956/03/24, [id number]  Outpatient Primary MD for the patient is Ann Held, DO    LOS - 3  Admit date - 03/21/2021    Chief Complaint  Patient presents with   Alcohol Problem       Brief Narrative (HPI from H&P)  -  [name] is a 65 y.o. female with medical history significant of hypertension, alcohol abuse, anxiety, and depression presents with what she reports is an alcohol problem.  Admits to drinking 4-5 beers per day on average and intermittently drinking wine for over 30 years.  Her last drink was 2 days ago.  Her friend Daine Floras was able to provide additional history over the phone. Her friend tried to take her to [facility] in [place] for alcohol rehab, but she was declined admission as she was significantly debilitated needing assistance, in the ER she was found to be in early DTs, had ataxia and difficulty in standing up and ambulating.  She was admitted for further care.   Subjective:   Patient in bed, appears comfortable, denies any headache, no fever, no chest pain or pressure, no shortness of breath , no abdominal pain. No new focal weakness.   Assessment  & Plan :     Alcohol abuse with early DTs.-Counseled to quit alcohol, CIWA protocol.  Will add scheduled Librium as well.  2.  Multiple falls with some ataxia.  MRI brain nonacute however limited due to motion artifact, continue PT OT, continue replacement of thiamine and B12, B12 levels were borderline, thiamine levels were added to blood work however she has received some supplemental thiamine by that time.  Could have  early Wernicke's encephalopathy as well however she has minimal to no nystagmus at this time.  Continue to monitor with [id number], folic acid and thiamine supplementation.  She is extremely weak, tried to walk her out of the bed in the room and required 2 people to get her up, she could hardly take 4-5 steps with a  walker and 2 people assisting her, had very poor balance and gait.  Overall extremely weak, in no shape way or form stable or safe to go home.  3.  Hyponatremia, Hypomagnesemia . IVF + replace Mag IV.  4.  Hypertension.  Blood pressure medications adjusted for better control.  5.  Possible underlying alcoholic cirrhosis.  Ultrasound noted.  Acute hepatitis panel will be added.  For now stable INR and LFTs.  Outpatient GI follow-up      Condition - Fair  Family Communication  : Friend at bedside on 03/25/2021  Code Status :  Full  Consults  :  None  PUD Prophylaxis : PPI   Procedures  :     RUQ [place].  Possible cirrhosis  MRI brain - nonacute.      Disposition Plan  :    Status is: Inpatient  Remains inpatient appropriate because:IV treatments appropriate due to intensity of illness or inability to take PO  Dispo: The patient is from: Home              Anticipated d/c is to: SNF              Patient currently is not medically stable to d/c.   Difficult to place patient No  DVT Prophylaxis  :    enoxaparin (LOVENOX) injection 40 mg Start: 03/22/21 1600    Lab Results  Component Value Date   PLT 268 03/25/2021    Diet :  Diet Order             Diet Heart Room service appropriate? Yes; Fluid consistency: Thin  Diet effective now                    Inpatient Medications  Scheduled Meds:  amLODipine  10 mg Oral Daily   chlordiazePOXIDE  10 mg Oral TID   cyanocobalamin  1,000 mcg Subcutaneous Daily   enoxaparin (LOVENOX) injection  40 mg Subcutaneous [id number]   folic acid  1 mg Oral Daily   lisinopril  40 mg Oral Daily   LORazepam  0-4 mg Intravenous Q12H    Or   LORazepam  0-4 mg Oral Q12H   metoprolol succinate  100 mg Oral Daily   multivitamin with minerals  1 tablet Oral Daily   pantoprazole  40 mg Oral Daily   sodium chloride flush  3 mL Intravenous Q12H   thiamine  500 mg Oral Q24H   [START ON 03/30/2021] vitamin B-12  1,000 mcg Oral BID   Continuous Infusions:  lactated ringers 125 mL/hr at 03/25/21 0947   thiamine injection     PRN Meds:.acetaminophen **OR** acetaminophen, albuterol, hydrALAZINE  Antibiotics  :    Anti-infectives (From admission, onward)    None        Time Spent in minutes  30   Lala Lund M.D on 03/25/2021 at 11:15 AM  To page go to [email or web address]   [facility] -  Office  [phone]    See all Orders from today for further details    Objective:   Vitals:   03/24/21 2336 03/25/21 0000 03/25/21 0357 03/25/21 0758  BP: (!) 159/107 (!) 160/96 (!) 148/97 (!) 147/96  Pulse:   70   Resp: 15  15   Temp: 97.8 F (36.6 C)  98.7 F (37.1 C) 97.6 F (36.4 C)  TempSrc: Oral  Oral Oral  SpO2: 99%  98%   Weight:      Height:  Wt Readings from Last 3 Encounters:  03/22/21 47.5 kg  03/18/21 51.8 kg  12/27/20 50.8 kg     Intake/Output Summary (Last 24 hours) at 03/25/2021 1115 Last data filed at 03/24/2021 2313 Gross per 24 hour  Intake --  Output 800 ml  Net -800 ml     Physical Exam  Awake Alert, No new F.N deficits,   [place].AT,PERRAL Supple Neck,No JVD, No cervical lymphadenopathy appriciated.  Symmetrical Chest wall movement, Good air movement bilaterally, CTAB RRR,No Gallops, Rubs or new Murmurs, No Parasternal Heave +ve B.Sounds, Abd Soft, No tenderness, No organomegaly appriciated, No rebound - guarding or rigidity. No Cyanosis, Clubbing or edema, No new Rash or bruise    Data Review:    CBC Recent Labs  Lab 03/18/21 1128 03/21/21 1524 03/23/21 0115 03/24/21 0114 03/25/21 0057  WBC 5.6 5.1 6.9 5.3 5.8  HGB 13.5 14.2 14.4 13.5 12.7  HCT 39.5 41.2  39.3 37.4 35.2*  PLT 339.0 358 289 266 268  MCV 111.0 Repeated and verified X2.* 107.0* 104.0* 104.8* 104.5*  MCH  --  36.9* 38.1* 37.8* 37.7*  MCHC 34.2 34.5 36.6* 36.1* 36.1*  RDW 13.7 12.6 12.3 12.0 12.1  LYMPHSABS 1.4 1.2  --  1.0 1.4  MONOABS 0.7 0.5  --  0.6 0.9  EOSABS 0.1 0.1  --  0.1 0.1  BASOSABS 0.1 0.1  --  0.0 0.1    Recent Labs  Lab 03/18/21 1128 03/21/21 1524 03/22/21 1416 03/22/21 1420 03/23/21 0115 03/23/21 1450 03/24/21 0114 03/25/21 0057  NA 131* 130*  --   --  129*  --  130* 127*  K 4.7 3.9  --   --  3.4*  --  3.4* 3.7  CL 93* 91*  --   --  91*  --  95* 93*  CO2 26 25  --   --  24  --  24 26  GLUCOSE 69* 121*  --   --  99  --  135* 99  BUN 6 <5*  --   --  <5*  --  <5* <5*  CREATININE 0.67 0.66  --   --  0.53  --  0.72 0.66  CALCIUM 10.0 9.1  --   --  9.6  --  9.6 9.2  AST 30 38  --   --   --   --  45* 38  ALT 21 23  --   --   --   --  24 27  ALKPHOS 98 89  --   --   --   --  71 55  BILITOT 0.7 0.3  --   --   --   --  1.0 0.8  ALBUMIN 4.4 4.3  --   --   --   --  3.8 3.4*  MG  --   --   --  1.7  --   --  1.8 1.6*  INR  --   --   --  1.0  --   --   --   --   TSH 1.75  --   --  1.282  --   --   --   --   AMMONIA 22  --  12  --   --   --   --   --   BNP  --   --   --   --   --  84.1 98.6 82.3    ------------------------------------------------------------------------------------------------------------------ No results for input(s): CHOL, HDL, LDLCALC,  TRIG, CHOLHDL, LDLDIRECT in the last 72 hours.  Lab Results  Component Value Date   HGBA1C 5.2 06/14/2015   ------------------------------------------------------------------------------------------------------------------ Recent Labs    03/22/21 1420  TSH 1.282    Cardiac Enzymes No results for input(s): CKMB, TROPONINI, MYOGLOBIN in the last 168 hours.  Invalid input(s): CK ------------------------------------------------------------------------------------------------------------------     Component Value Date/Time   BNP 82.3 03/25/2021 0057    Radiology Reports MR BRAIN WO CONTRAST  Result Date: 03/23/2021 CLINICAL DATA:  Ataxia, question Wernicke's encephalopathy EXAM: MRI HEAD WITHOUT CONTRAST TECHNIQUE: Multiplanar, multiecho pulse sequences of the brain and surrounding structures were obtained without intravenous contrast. COMPARISON:  CT head 02/21/2021 FINDINGS: The study is limited due to patient cooperation. The provided sequences are moderately motion degraded. Brain: There is no evidence of acute intracranial hemorrhage, extra-axial fluid collection, or infarct. There is marked global parenchymal volume loss with enlargement of the ventricular system, advanced for age. There is no discernible lobar predominance. Confluent T2/FLAIR hyperintensity in the subcortical and periventricular white matter likely reflects sequela of chronic white matter microangiopathy. There are no findings of Wernicke's encephalopathy, though the mamillary bodies are not well evaluated. Vascular: Normal flow voids. Skull and upper cervical spine: Normal marrow signal. Sinuses/Orbits: The paranasal sinuses are clear. The globes and orbits are unremarkable. Other: None. IMPRESSION: 1. Motion degraded study as above. 2. No evidence of acute intracranial pathology. Specifically, no definite evidence of Wernicke's encephalopathy, though the mamillary bodies are not well evaluated. Consider repeat imaging with intravenous contrast when the patient can tolerate, if indicated. 3. Marked global parenchymal volume loss without discernible lobar predominance, advanced for age. Electronically Signed   By: Valetta Mole M.D.   On: 03/23/2021 11:43   US Abdomen Limited RUQ (LIVER/GB)  Result Date: 03/21/2021 CLINICAL DATA:  alcoholic cirrhosis EXAM: ULTRASOUND ABDOMEN LIMITED RIGHT UPPER QUADRANT COMPARISON:  None. FINDINGS: Gallbladder: The gallbladder is nondilated without visible intraluminal stones. There is a  non dependent, nonshadowing 4 mm nodular focus along the hepatic side gallbladder wall, which could be adherent sludge or small polyp, the latter of which would require no follow-up per consensus guidelines. Common bile duct: Diameter: 9 mm.  No obstructing lesion identified sonographically. Liver: Diffusely increased liver echogenicity with mildly nodular contours. Portal vein is patent on color Doppler imaging with normal direction of blood flow towards the liver. Other: None. IMPRESSION: Hepatic steatosis with mildly nodular liver contours, suggesting cirrhosis. No ascites. Dilated common bile duct measuring 9 mm, previously 10 mm on prior ultrasound in October 2021. If not already performed, MRI/MRCP is recommended for further evaluation. Electronically Signed   By: Maurine Simmering M.D.   On: 03/21/2021 11:56

## 2021-03-26 ENCOUNTER — Encounter (HOSPITAL_COMMUNITY): Payer: Self-pay | Admitting: Internal Medicine

## 2021-03-26 DIAGNOSIS — I10 Essential (primary) hypertension: Secondary | ICD-10-CM | POA: Diagnosis not present

## 2021-03-26 DIAGNOSIS — R296 Repeated falls: Secondary | ICD-10-CM | POA: Diagnosis not present

## 2021-03-26 DIAGNOSIS — F1010 Alcohol abuse, uncomplicated: Secondary | ICD-10-CM | POA: Diagnosis not present

## 2021-03-26 DIAGNOSIS — F419 Anxiety disorder, unspecified: Secondary | ICD-10-CM | POA: Diagnosis not present

## 2021-03-26 LAB — COMPREHENSIVE METABOLIC PANEL
ALT: 28 U/L (ref 0–44)
AST: 34 U/L (ref 15–41)
Albumin: 3.4 g/dL — ABNORMAL LOW (ref 3.5–5.0)
Alkaline Phosphatase: 62 U/L (ref 38–126)
Anion gap: 11 (ref 5–15)
BUN: 7 mg/dL — ABNORMAL LOW (ref 8–23)
CO2: 25 mmol/L (ref 22–32)
Calcium: 9.2 mg/dL (ref 8.9–10.3)
Chloride: 91 mmol/L — ABNORMAL LOW (ref 98–111)
Creatinine, Ser: 0.83 mg/dL (ref 0.44–1.00)
GFR, Estimated: >60 mL/min (ref >60)
Glucose, Bld: 113 mg/dL — ABNORMAL HIGH (ref 70–99)
Potassium: 3.7 mmol/L (ref 3.5–5.1)
Sodium: 127 mmol/L — ABNORMAL LOW (ref 135–145)
Total Bilirubin: 0.9 mg/dL (ref 0.3–1.2)
Total Protein: 6.2 g/dL — ABNORMAL LOW (ref 6.5–8.1)

## 2021-03-26 LAB — BASIC METABOLIC PANEL
Anion gap: 13 (ref 5–15)
Anion gap: 8 (ref 5–15)
BUN: 10 mg/dL (ref 8–23)
BUN: 10 mg/dL (ref 8–23)
CO2: 20 mmol/L — ABNORMAL LOW (ref 22–32)
CO2: 25 mmol/L (ref 22–32)
Calcium: 9.2 mg/dL (ref 8.9–10.3)
Calcium: 9.4 mg/dL (ref 8.9–10.3)
Chloride: 92 mmol/L — ABNORMAL LOW (ref 98–111)
Chloride: 93 mmol/L — ABNORMAL LOW (ref 98–111)
Creatinine, Ser: 0.9 mg/dL (ref 0.44–1.00)
Creatinine, Ser: 0.79 mg/dL (ref 0.44–1.00)
GFR, Estimated: >60 mL/min (ref >60)
GFR, Estimated: >60 mL/min (ref >60)
Glucose, Bld: 204 mg/dL — ABNORMAL HIGH (ref 70–99)
Glucose, Bld: 88 mg/dL (ref 70–99)
Potassium: 3.1 mmol/L — ABNORMAL LOW (ref 3.5–5.1)
Potassium: 4.3 mmol/L (ref 3.5–5.1)
Sodium: 125 mmol/L — ABNORMAL LOW (ref 135–145)
Sodium: 126 mmol/L — ABNORMAL LOW (ref 135–145)

## 2021-03-26 LAB — CBC WITH DIFFERENTIAL/PLATELET
Abs Immature Granulocytes: 0.05 10*3/uL (ref 0.00–0.07)
Basophils Absolute: 0.1 10*3/uL (ref 0.0–0.1)
Basophils Relative: 1 %
Eosinophils Absolute: 0.2 10*3/uL (ref 0.0–0.5)
Eosinophils Relative: 2 %
HCT: 35.1 % — ABNORMAL LOW (ref 36.0–46.0)
Hemoglobin: 12.9 g/dL (ref 12.0–15.0)
Immature Granulocytes: 1 %
Lymphocytes Relative: 18 %
Lymphs Abs: 1.3 10*3/uL (ref 0.7–4.0)
MCH: 37.7 pg — ABNORMAL HIGH (ref 26.0–34.0)
MCHC: 36.8 g/dL — ABNORMAL HIGH (ref 30.0–36.0)
MCV: 102.6 fL — ABNORMAL HIGH (ref 80.0–100.0)
Monocytes Absolute: 0.9 10*3/uL (ref 0.1–1.0)
Monocytes Relative: 12 %
Neutro Abs: 4.8 10*3/uL (ref 1.7–7.7)
Neutrophils Relative %: 66 %
Platelets: 283 10*3/uL (ref 150–400)
RBC: 3.42 MIL/uL — ABNORMAL LOW (ref 3.87–5.11)
RDW: 11.8 % (ref 11.5–15.5)
WBC: 7.3 10*3/uL (ref 4.0–10.5)
nRBC: 0 % (ref 0.0–0.2)

## 2021-03-26 LAB — HEPATIC FUNCTION PANEL
ALT: 28 U/L (ref 0–44)
AST: 37 U/L (ref 15–41)
Albumin: 3.3 g/dL — ABNORMAL LOW (ref 3.5–5.0)
Alkaline Phosphatase: 56 U/L (ref 38–126)
Bilirubin, Direct: 0.1 mg/dL (ref 0.0–0.2)
Indirect Bilirubin: 0.5 mg/dL (ref 0.3–0.9)
Total Bilirubin: 0.6 mg/dL (ref 0.3–1.2)
Total Protein: 6.2 g/dL — ABNORMAL LOW (ref 6.5–8.1)

## 2021-03-26 LAB — MAGNESIUM: Magnesium: 1.8 mg/dL (ref 1.7–2.4)

## 2021-03-26 LAB — BRAIN NATRIURETIC PEPTIDE: B Natriuretic Peptide: 64.4 pg/mL (ref 0.0–100.0)

## 2021-03-26 MED ORDER — CHLORDIAZEPOXIDE HCL 5 MG PO CAPS
5.0000 mg | ORAL_CAPSULE | Freq: Three times a day (TID) | ORAL | Status: DC
  Administered 2021-03-26 – 2021-03-27 (×5): 5 mg via ORAL
  Filled 2021-03-26 (×5): qty 1

## 2021-03-26 MED ORDER — POTASSIUM CHLORIDE CRYS ER 20 MEQ PO TBCR
40.0000 meq | EXTENDED_RELEASE_TABLET | Freq: Once | ORAL | Status: AC
  Administered 2021-03-26: 40 meq via ORAL
  Filled 2021-03-26: qty 2

## 2021-03-26 MED ORDER — TOLVAPTAN 15 MG PO TABS
15.0000 mg | ORAL_TABLET | Freq: Once | ORAL | Status: AC
  Administered 2021-03-26: 15 mg via ORAL
  Filled 2021-03-26: qty 1

## 2021-03-26 MED ORDER — TOLVAPTAN 15 MG PO TABS: 15.0000 mg | ORAL_TABLET | Freq: Once | ORAL | Status: DC

## 2021-03-26 MED ORDER — HYDROCORTISONE 0.5 % EX CREA: TOPICAL_CREAM | Freq: Two times a day (BID) | CUTANEOUS | Status: DC | PRN

## 2021-03-26 NOTE — Progress Notes (Addendum)
[place] for tolvaptan  Indication: hyponatremia  65 yo F with HTH, alcohol abuse, anxiety, and depression here for alcohol withdraw. Consistently hyponatremic during admission after multiple attempts to correct Na with fluids. Na this morning 127 - technically does not meet criteria for use per tolvaptan policy but benefit likely outweighs the risk, discussed with Dr Candiss Norse who is in agreement.  Plan: Give tolvaptan '15mg'$  x 1 dose  Check BMP in 8 hour to monitor Na level   Thank you for allowing pharmacy to participate in this patient's care.  Levonne Spiller, PharmD PGY1 Acute Care Resident  03/26/2021,9:02 AM

## 2021-03-26 NOTE — Progress Notes (Signed)
NT performed bladder scan on patient showing 400 mL of urine. 0800 bladder scan showed 238 mL.  Patient has had an incontinence episode today, unable to measure output, otherwise patient has not voided.  Patient stated she does not feel full or the urge to void at this time.   Messaged MD Candiss Norse to inform.  Received orders to perform an in and out catheter.

## 2021-03-26 NOTE — Progress Notes (Signed)
[name] visiting with patient bedside.  [name] stated that she did not take patient's cell phone home on Wednesday 9/14.   Patient's cell phone was last seen during day shift on 9/14.  Per notes, night shift RN notified of missing cell phone at 2300 & searched patient's room and linen bag but was unable to locate the cell phone.  Patient concerned that cell phone may have been taken.  Patient has her purse at bedside and is concerned about her belongings.  Nicole [place] at [place] is going to take patient's purse home with her today and patient is aware and agrees with this plan.

## 2021-03-26 NOTE — Progress Notes (Signed)
PROGRESS NOTE                                                                                                                                                                                                             Patient Demographics:    [name], is a 65 y.o. female, DOB - 03-Jun-1956, [id number]  Outpatient Primary MD for the patient is Nicole Held, DO    LOS - 4  Admit date - 03/21/2021    Chief Complaint  Patient presents with   Alcohol Problem       Brief Narrative (HPI from H&P)  -  [name] is a 65 y.o. female with medical history significant of hypertension, alcohol abuse, anxiety, and depression presents with what she reports is an alcohol problem.  Admits to drinking 4-5 beers per day on average and intermittently drinking wine for over 30 years.  Her last drink was 2 days ago.  Her friend [name] was able to provide additional history over the phone. Her friend tried to take her to [facility] in [place] for alcohol rehab, but she was declined admission as she was significantly debilitated needing assistance, in the ER she was found to be in early DTs, had ataxia and difficulty in standing up and ambulating.  She was admitted for further care.   Subjective:   Patient in bed, appears comfortable, denies any headache, no fever, no chest pain or pressure, no shortness of breath , no abdominal pain. No new focal weakness.   Assessment  & Plan :     Alcohol abuse with early DTs.- Counseled to quit alcohol, CIWA protocol.  Will add scheduled Librium as well, much better.  2.  Multiple falls with some ataxia.  MRI brain nonacute however limited due to motion artifact, continue PT OT, continue replacement of thiamine and B12, B12 levels were borderline, thiamine levels were added to blood work however she has received some supplemental thiamine by that time.   Could have early Wernicke's encephalopathy as well however she has minimal to no nystagmus at this time.  Continue to monitor with [id number], folic acid and thiamine supplementation.  She is extremely weak, tried to walk her out of the bed in the room and required 2 people to get her up, she could hardly take 4-5  steps with a walker and 2 people assisting her, had very poor balance and gait.  Overall extremely weak, in no shape way or form stable or safe to go home.  3.  Hyponatremia, Hypomagnesemia, Hypokalemia .  replaced K &  Mag IV, Na worse after IVF, clinically euvolemic , UNa> 100, U Osm >> S Osm, SIADH, will try single dose Samsca, DW Renal Dr.Vikas, appropriate for therapeutic trial.  Appropriate for therapeutic trial, hyponatremia has been oscillating between 125 and 131 for the last 4 days and has been resistant to conventional treatment.  4.  Hypertension.  Blood pressure medications adjusted for better control.  5.  Possible underlying alcoholic cirrhosis.  Ultrasound noted.  Acute hepatitis panel will be added.  For now stable INR and LFTs.  Outpatient GI follow-up      Condition - Fair  Family Communication  : Friend at bedside on 03/25/2021  Code Status :  Full  Consults  :  None  PUD Prophylaxis : PPI   Procedures  :     RUQ [place].  Possible cirrhosis  MRI brain - nonacute.      Disposition Plan  :    Status is: Inpatient  Remains inpatient appropriate because:IV treatments appropriate due to intensity of illness or inability to take PO  Dispo: The patient is from: Home              Anticipated d/c is to: SNF              Patient currently is not medically stable to d/c.   Difficult to place patient No  DVT Prophylaxis  :    enoxaparin (LOVENOX) injection 40 mg Start: 03/22/21 1600    Lab Results  Component Value Date   PLT 283 03/26/2021    Diet :  Diet Order             Diet Heart Room service appropriate? Yes; Fluid consistency: Thin  Diet effective now                     Inpatient Medications  Scheduled Meds:  amLODipine  10 mg Oral Daily   chlordiazePOXIDE  10 mg Oral TID   cyanocobalamin  1,000 mcg Subcutaneous Daily   enoxaparin (LOVENOX) injection  40 mg Subcutaneous [id number]   folic acid  1 mg Oral Daily   lisinopril  40 mg Oral Daily   LORazepam  0-4 mg Intravenous Q12H   Or   LORazepam  0-4 mg Oral Q12H   metoprolol succinate  100 mg Oral Daily   multivitamin with minerals  1 tablet Oral Daily   pantoprazole  40 mg Oral Daily   potassium chloride  40 mEq Oral Once   sodium chloride flush  3 mL Intravenous Q12H   thiamine  500 mg Oral Q24H   [START ON 03/30/2021] vitamin B-12  1,000 mcg Oral BID   Continuous Infusions:  thiamine injection     PRN Meds:.acetaminophen **OR** acetaminophen, albuterol, hydrALAZINE, LORazepam **OR** LORazepam, melatonin  Antibiotics  :    Anti-infectives (From admission, onward)    None        Time Spent in minutes  30   Lala Lund M.D on 03/26/2021 at 11:27 AM  To page go to [email or web address]   [facility] -  Office  [phone]    See all Orders from today for further details    Objective:   Vitals:   03/26/21 0000 03/26/21 0401 03/26/21 0800 03/26/21  0812  BP: 129/83 126/88 (!) 131/94 (!) 145/87  Pulse: 69 66 76 80  Resp:  '14 15 15  '$ Temp:  98 F (36.7 C)  98.8 F (37.1 C)  TempSrc:  Oral  Oral  SpO2:  96% 95% 97%  Weight:      Height:        Wt Readings from Last 3 Encounters:  03/22/21 47.5 kg  03/18/21 51.8 kg  12/27/20 50.8 kg     Intake/Output Summary (Last 24 hours) at 03/26/2021 1127 Last data filed at 03/26/2021 0115 Gross per 24 hour  Intake 1232.16 ml  Output 1691 ml  Net -458.84 ml     Physical Exam  Awake Alert, No new F.N deficits, Normal affect [place].AT,PERRAL Supple Neck,No JVD, No cervical lymphadenopathy appriciated.  Symmetrical Chest wall movement, Good air movement bilaterally, CTAB RRR,No Gallops, Rubs or new Murmurs,  No Parasternal Heave +ve B.Sounds, Abd Soft, No tenderness, No organomegaly appriciated, No rebound - guarding or rigidity. No Cyanosis, Clubbing or edema, No new Rash or bruise     Data Review:    CBC Recent Labs  Lab 03/21/21 1524 03/23/21 0115 03/24/21 0114 03/25/21 0057 03/26/21 0050  WBC 5.1 6.9 5.3 5.8 7.3  HGB 14.2 14.4 13.5 12.7 12.9  HCT 41.2 39.3 37.4 35.2* 35.1*  PLT 358 289 266 268 283  MCV 107.0* 104.0* 104.8* 104.5* 102.6*  MCH 36.9* 38.1* 37.8* 37.7* 37.7*  MCHC 34.5 36.6* 36.1* 36.1* 36.8*  RDW 12.6 12.3 12.0 12.1 11.8  LYMPHSABS 1.2  --  1.0 1.4 1.3  MONOABS 0.5  --  0.6 0.9 0.9  EOSABS 0.1  --  0.1 0.1 0.2  BASOSABS 0.1  --  0.0 0.1 0.1    Recent Labs  Lab 03/21/21 1524 03/22/21 1416 03/22/21 1420 03/23/21 0115 03/23/21 1450 03/24/21 0114 03/25/21 0057 03/26/21 0050 03/26/21 0947  NA 130*  --   --  129*  --  130* 127* 127* 125*  K 3.9  --   --  3.4*  --  3.4* 3.7 3.7 3.1*  CL 91*  --   --  91*  --  95* 93* 91* 92*  CO2 25  --   --  24  --  '24 26 25 '$ 20*  GLUCOSE 121*  --   --  99  --  135* 99 113* 204*  BUN <5*  --   --  <5*  --  <5* <5* 7* 10  CREATININE 0.66  --   --  0.53  --  0.72 0.66 0.83 0.90  CALCIUM 9.1  --   --  9.6  --  9.6 9.2 9.2 9.2  AST 38  --   --   --   --  45* 38 34 37  ALT 23  --   --   --   --  '24 27 28 28  '$ ALKPHOS 89  --   --   --   --  71 55 62 56  BILITOT 0.3  --   --   --   --  1.0 0.8 0.9 0.6  ALBUMIN 4.3  --   --   --   --  3.8 3.4* 3.4* 3.3*  MG  --   --  1.7  --   --  1.8 1.6* 1.8  --   INR  --   --  1.0  --   --   --   --   --   --  TSH  --   --  1.282  --   --   --   --   --   --   AMMONIA  --  12  --   --   --   --   --   --   --   BNP  --   --   --   --  84.1 98.6 82.3 64.4  --     ------------------------------------------------------------------------------------------------------------------ No results for input(s): CHOL, HDL, LDLCALC, TRIG, CHOLHDL, LDLDIRECT in the last 72 hours.  Lab Results   Component Value Date   HGBA1C 5.2 06/14/2015   ------------------------------------------------------------------------------------------------------------------ No results for input(s): TSH, T4TOTAL, T3FREE, THYROIDAB in the last 72 hours.  Invalid input(s): FREET3   Cardiac Enzymes No results for input(s): CKMB, TROPONINI, MYOGLOBIN in the last 168 hours.  Invalid input(s): CK ------------------------------------------------------------------------------------------------------------------    Component Value Date/Time   BNP 64.4 03/26/2021 0050    Radiology Reports MR BRAIN WO CONTRAST  Result Date: 03/23/2021 CLINICAL DATA:  Ataxia, question Wernicke's encephalopathy EXAM: MRI HEAD WITHOUT CONTRAST TECHNIQUE: Multiplanar, multiecho pulse sequences of the brain and surrounding structures were obtained without intravenous contrast. COMPARISON:  CT head 02/21/2021 FINDINGS: The study is limited due to patient cooperation. The provided sequences are moderately motion degraded. Brain: There is no evidence of acute intracranial hemorrhage, extra-axial fluid collection, or infarct. There is marked global parenchymal volume loss with enlargement of the ventricular system, advanced for age. There is no discernible lobar predominance. Confluent T2/FLAIR hyperintensity in the subcortical and periventricular white matter likely reflects sequela of chronic white matter microangiopathy. There are no findings of Wernicke's encephalopathy, though the mamillary bodies are not well evaluated. Vascular: Normal flow voids. Skull and upper cervical spine: Normal marrow signal. Sinuses/Orbits: The paranasal sinuses are clear. The globes and orbits are unremarkable. Other: None. IMPRESSION: 1. Motion degraded study as above. 2. No evidence of acute intracranial pathology. Specifically, no definite evidence of Wernicke's encephalopathy, though the mamillary bodies are not well evaluated. Consider repeat imaging  with intravenous contrast when the patient can tolerate, if indicated. 3. Marked global parenchymal volume loss without discernible lobar predominance, advanced for age. Electronically Signed   By: Valetta Mole M.D.   On: 03/23/2021 11:43   US Abdomen Limited RUQ (LIVER/GB)  Result Date: 03/21/2021 CLINICAL DATA:  alcoholic cirrhosis EXAM: ULTRASOUND ABDOMEN LIMITED RIGHT UPPER QUADRANT COMPARISON:  None. FINDINGS: Gallbladder: The gallbladder is nondilated without visible intraluminal stones. There is a non dependent, nonshadowing 4 mm nodular focus along the hepatic side gallbladder wall, which could be adherent sludge or small polyp, the latter of which would require no follow-up per consensus guidelines. Common bile duct: Diameter: 9 mm.  No obstructing lesion identified sonographically. Liver: Diffusely increased liver echogenicity with mildly nodular contours. Portal vein is patent on color Doppler imaging with normal direction of blood flow towards the liver. Other: None. IMPRESSION: Hepatic steatosis with mildly nodular liver contours, suggesting cirrhosis. No ascites. Dilated common bile duct measuring 9 mm, previously 10 mm on prior ultrasound in October 2021. If not already performed, MRI/MRCP is recommended for further evaluation. Electronically Signed   By: Maurine Simmering M.D.   On: 03/21/2021 11:56

## 2021-03-27 DIAGNOSIS — F1010 Alcohol abuse, uncomplicated: Secondary | ICD-10-CM | POA: Diagnosis not present

## 2021-03-27 DIAGNOSIS — F419 Anxiety disorder, unspecified: Secondary | ICD-10-CM | POA: Diagnosis not present

## 2021-03-27 DIAGNOSIS — R296 Repeated falls: Secondary | ICD-10-CM | POA: Diagnosis not present

## 2021-03-27 DIAGNOSIS — I10 Essential (primary) hypertension: Secondary | ICD-10-CM | POA: Diagnosis not present

## 2021-03-27 LAB — COMPREHENSIVE METABOLIC PANEL
ALT: 30 U/L (ref 0–44)
AST: 33 U/L (ref 15–41)
Albumin: 3.4 g/dL — ABNORMAL LOW (ref 3.5–5.0)
Alkaline Phosphatase: 55 U/L (ref 38–126)
Anion gap: 11 (ref 5–15)
BUN: 10 mg/dL (ref 8–23)
CO2: 23 mmol/L (ref 22–32)
Calcium: 9.5 mg/dL (ref 8.9–10.3)
Chloride: 95 mmol/L — ABNORMAL LOW (ref 98–111)
Creatinine, Ser: 0.83 mg/dL (ref 0.44–1.00)
GFR, Estimated: >60 mL/min (ref >60)
Glucose, Bld: 95 mg/dL (ref 70–99)
Potassium: 4.1 mmol/L (ref 3.5–5.1)
Sodium: 129 mmol/L — ABNORMAL LOW (ref 135–145)
Total Bilirubin: 0.9 mg/dL (ref 0.3–1.2)
Total Protein: 5.9 g/dL — ABNORMAL LOW (ref 6.5–8.1)

## 2021-03-27 LAB — CBC WITH DIFFERENTIAL/PLATELET
Abs Immature Granulocytes: 0.04 10*3/uL (ref 0.00–0.07)
Basophils Absolute: 0 10*3/uL (ref 0.0–0.1)
Basophils Relative: 1 %
Eosinophils Absolute: 0.1 10*3/uL (ref 0.0–0.5)
Eosinophils Relative: 1 %
HCT: 35.9 % — ABNORMAL LOW (ref 36.0–46.0)
Hemoglobin: 12.9 g/dL (ref 12.0–15.0)
Immature Granulocytes: 1 %
Lymphocytes Relative: 15 %
Lymphs Abs: 0.9 10*3/uL (ref 0.7–4.0)
MCH: 37.5 pg — ABNORMAL HIGH (ref 26.0–34.0)
MCHC: 35.9 g/dL (ref 30.0–36.0)
MCV: 104.4 fL — ABNORMAL HIGH (ref 80.0–100.0)
Monocytes Absolute: 0.8 10*3/uL (ref 0.1–1.0)
Monocytes Relative: 13 %
Neutro Abs: 4.5 10*3/uL (ref 1.7–7.7)
Neutrophils Relative %: 69 %
Platelets: 263 10*3/uL (ref 150–400)
RBC: 3.44 MIL/uL — ABNORMAL LOW (ref 3.87–5.11)
RDW: 12.2 % (ref 11.5–15.5)
WBC: 6.4 10*3/uL (ref 4.0–10.5)
nRBC: 0 % (ref 0.0–0.2)

## 2021-03-27 LAB — MAGNESIUM: Magnesium: 1.8 mg/dL (ref 1.7–2.4)

## 2021-03-27 LAB — BRAIN NATRIURETIC PEPTIDE: B Natriuretic Peptide: 29.1 pg/mL (ref 0.0–100.0)

## 2021-03-27 MED ORDER — UREA 15 G PO PACK
15.0000 g | PACK | Freq: Two times a day (BID) | ORAL | Status: AC
  Administered 2021-03-27 (×2): 15 g via ORAL
  Filled 2021-03-27 (×3): qty 1

## 2021-03-27 NOTE — Progress Notes (Signed)
Ambulated with patient in room from chair to door with front wheel walker and nurse tech. Patient able to ambulate with walker to room door, pt reported she could not make it back to the room bed, sara lift used to assist pt back to her bed, pt urinated red urine during the walk, denied any pain or discomfort with urination, pt assisted back to bed, peri care provided, no open areas noted to pubic area, md notified.

## 2021-03-27 NOTE — Progress Notes (Signed)
Notified md: hello sir, her urine has changed from amber/yellow to blood tinged, pt denies any pain or discomfort with urinating. thank you Received response that patient was cath during pm shift, possibly trauma and should improve, will continue to monitor.

## 2021-03-27 NOTE — Progress Notes (Signed)
PROGRESS NOTE                                                                                                                                                                                                             Patient Demographics:    [name], is a 65 y.o. female, DOB - May 09, 1956, [id number]  Outpatient Primary MD for the patient is Ann Held, DO    LOS - 5  Admit date - 03/21/2021    Chief Complaint  Patient presents with   Alcohol Problem       Brief Narrative (HPI from H&P)  -  [name] is a 65 y.o. female with medical history significant of hypertension, alcohol abuse, anxiety, and depression presents with what she reports is an alcohol problem.  Admits to drinking 4-5 beers per day on average and intermittently drinking wine for over 30 years.  Her last drink was 2 days ago.  Her friend Daine Floras was able to provide additional history over the phone. Her friend tried to take her to [facility] in [place] for alcohol rehab, but she was declined admission as she was significantly debilitated needing assistance, in the ER she was found to be in early DTs, had ataxia and difficulty in standing up and ambulating.  She was admitted for further care.   Subjective:   Patient in bed, appears comfortable, denies any headache, no fever, no chest pain or pressure, no shortness of breath , no abdominal pain. No new focal weakness.    Assessment  & Plan :     Alcohol abuse with early DTs.- Counseled to quit alcohol, CIWA protocol.  Will add scheduled Librium as well, much better.  2.  Multiple falls with some ataxia.  MRI brain nonacute however limited due to motion artifact, continue PT OT, continue replacement of thiamine and B12, B12 levels were borderline, thiamine levels were added to blood work however she has received some supplemental thiamine by that  time.  Could have early Wernicke's encephalopathy as well however she has minimal to no nystagmus at this time.  Continue to monitor with [id number], folic acid and thiamine supplementation.  She is extremely weak, tried to walk her out of the bed in the room and required 2 people to get her up, she could hardly take  4-5 steps with a walker and 2 people assisting her, had very poor balance and gait.  Overall extremely weak, in no shape way or form stable or safe to go home.  3.  Hyponatremia Acute on Chronic, Hypomagnesemia, Hypokalemia .  replaced K &  Mag IV, Na worse after IVF, clinically euvolemic , UNa> 100, U Osm >> S Osm, SIADH, post single dose Samsca, further drop in Na has stopped 125>>129, no further Samasca for now. Fluid restrict, Urea PO x 2 and monitor, Goal Na > 130 ( chronic level around 132).  4.  Hypertension.  Blood pressure medications adjusted for better control.  5.  Possible underlying alcoholic cirrhosis.  Ultrasound noted.  Acute hepatitis panel will be added.  For now stable INR and LFTs.  Outpatient GI follow-up      Condition - Fair  Family Communication  : Friend at bedside on 03/25/2021  Code Status :  Full  Consults  :  None  PUD Prophylaxis : PPI   Procedures  :     RUQ [place].  Possible cirrhosis  MRI brain - nonacute.      Disposition Plan  :    Status is: Inpatient  Remains inpatient appropriate because:IV treatments appropriate due to intensity of illness or inability to take PO  Dispo: The patient is from: Home              Anticipated d/c is to: SNF              Patient currently is not medically stable to d/c.   Difficult to place patient No  DVT Prophylaxis  :    enoxaparin (LOVENOX) injection 40 mg Start: 03/22/21 1600    Lab Results  Component Value Date   PLT 263 03/27/2021    Diet :  Diet Order             Diet Heart Room service appropriate? Yes; Fluid consistency: Thin; Fluid restriction: 1200 mL Fluid  Diet effective now                     Inpatient Medications  Scheduled Meds:  amLODipine  10 mg Oral Daily   chlordiazePOXIDE  5 mg Oral TID   cyanocobalamin  1,000 mcg Subcutaneous Daily   enoxaparin (LOVENOX) injection  40 mg Subcutaneous [id number]   folic acid  1 mg Oral Daily   lisinopril  40 mg Oral Daily   metoprolol succinate  100 mg Oral Daily   multivitamin with minerals  1 tablet Oral Daily   pantoprazole  40 mg Oral Daily   sodium chloride flush  3 mL Intravenous Q12H   thiamine  500 mg Oral Q24H   urea  15 g Oral BID   [START ON 03/30/2021] vitamin B-12  1,000 mcg Oral BID   Continuous Infusions:  thiamine injection     PRN Meds:.acetaminophen **OR** acetaminophen, albuterol, hydrALAZINE, LORazepam **OR** LORazepam, melatonin  Antibiotics  :    Anti-infectives (From admission, onward)    None        Time Spent in minutes  30   Lala Lund M.D on 03/27/2021 at 9:24 AM  To page go to [email or web address]   [facility] -  Office  [phone]    See all Orders from today for further details    Objective:   Vitals:   03/26/21 2000 03/27/21 0000 03/27/21 0315 03/27/21 0751  BP: 117/76 109/74 100/66 138/82  Pulse: 72 65 67 60  Resp:  $'16 16 17 14  'U$ Temp: 98.2 F (36.8 C) 98.5 F (36.9 C) 98.8 F (37.1 C) 98.3 F (36.8 C)  TempSrc: Oral Oral Oral Oral  SpO2: 98% 97% 98% 100%  Weight:   47.7 kg   Height:        Wt Readings from Last 3 Encounters:  03/27/21 47.7 kg  03/18/21 51.8 kg  12/27/20 50.8 kg     Intake/Output Summary (Last 24 hours) at 03/27/2021 0924 Last data filed at 03/26/2021 1530 Gross per 24 hour  Intake --  Output 450 ml  Net -450 ml     Physical Exam  Awake Alert, No new F.N deficits, Normal affect .AT,PERRAL Supple Neck,No JVD, No cervical lymphadenopathy appriciated.  Symmetrical Chest wall movement, Good air movement bilaterally, CTAB RRR,No Gallops, Rubs or new Murmurs, No Parasternal Heave +ve B.Sounds, Abd Soft, No tenderness, No  organomegaly appriciated, No rebound - guarding or rigidity. No Cyanosis, Clubbing or edema, No new Rash or bruise    Data Review:    CBC Recent Labs  Lab 03/21/21 1524 03/23/21 0115 03/24/21 0114 03/25/21 0057 03/26/21 0050 03/27/21 0113  WBC 5.1 6.9 5.3 5.8 7.3 6.4  HGB 14.2 14.4 13.5 12.7 12.9 12.9  HCT 41.2 39.3 37.4 35.2* 35.1* 35.9*  PLT 358 289 266 268 283 263  MCV 107.0* 104.0* 104.8* 104.5* 102.6* 104.4*  MCH 36.9* 38.1* 37.8* 37.7* 37.7* 37.5*  MCHC 34.5 36.6* 36.1* 36.1* 36.8* 35.9  RDW 12.6 12.3 12.0 12.1 11.8 12.2  LYMPHSABS 1.2  --  1.0 1.4 1.3 0.9  MONOABS 0.5  --  0.6 0.9 0.9 0.8  EOSABS 0.1  --  0.1 0.1 0.2 0.1  BASOSABS 0.1  --  0.0 0.1 0.1 0.0    Recent Labs  Lab 03/22/21 1416 03/22/21 1420 03/23/21 0115 03/23/21 1450 03/24/21 0114 03/25/21 0057 03/26/21 0050 03/26/21 0947 03/26/21 1459 03/27/21 0113  NA  --   --    < >  --  130* 127* 127* 125* 126* 129*  K  --   --    < >  --  3.4* 3.7 3.7 3.1* 4.3 4.1  CL  --   --    < >  --  95* 93* 91* 92* 93* 95*  CO2  --   --    < >  --  '24 26 25 '$ 20* 25 23  GLUCOSE  --   --    < >  --  135* 99 113* 204* 88 95  BUN  --   --    < >  --  <5* <5* 7* '10 10 10  '$ CREATININE  --   --    < >  --  0.72 0.66 0.83 0.90 0.79 0.83  CALCIUM  --   --    < >  --  9.6 9.2 9.2 9.2 9.4 9.5  AST  --   --   --   --  45* 38 34 37  --  33  ALT  --   --   --   --  '24 27 28 28  '$ --  30  ALKPHOS  --   --   --   --  71 55 62 56  --  55  BILITOT  --   --   --   --  1.0 0.8 0.9 0.6  --  0.9  ALBUMIN  --   --   --   --  3.8 3.4* 3.4* 3.3*  --  3.4*  MG  --  1.7  --   --  1.8 1.6* 1.8  --   --  1.8  INR  --  1.0  --   --   --   --   --   --   --   --   TSH  --  1.282  --   --   --   --   --   --   --   --   AMMONIA 12  --   --   --   --   --   --   --   --   --   BNP  --   --   --  84.1 98.6 82.3 64.4  --   --  29.1   < > = values in this interval not displayed.     ------------------------------------------------------------------------------------------------------------------ No results for input(s): CHOL, HDL, LDLCALC, TRIG, CHOLHDL, LDLDIRECT in the last 72 hours.  Lab Results  Component Value Date   HGBA1C 5.2 06/14/2015   ------------------------------------------------------------------------------------------------------------------ No results for input(s): TSH, T4TOTAL, T3FREE, THYROIDAB in the last 72 hours.  Invalid input(s): FREET3   Cardiac Enzymes No results for input(s): CKMB, TROPONINI, MYOGLOBIN in the last 168 hours.  Invalid input(s): CK ------------------------------------------------------------------------------------------------------------------    Component Value Date/Time   BNP 29.1 03/27/2021 0113    Radiology Reports MR BRAIN WO CONTRAST  Result Date: 03/23/2021 CLINICAL DATA:  Ataxia, question Wernicke's encephalopathy EXAM: MRI HEAD WITHOUT CONTRAST TECHNIQUE: Multiplanar, multiecho pulse sequences of the brain and surrounding structures were obtained without intravenous contrast. COMPARISON:  CT head 02/21/2021 FINDINGS: The study is limited due to patient cooperation. The provided sequences are moderately motion degraded. Brain: There is no evidence of acute intracranial hemorrhage, extra-axial fluid collection, or infarct. There is marked global parenchymal volume loss with enlargement of the ventricular system, advanced for age. There is no discernible lobar predominance. Confluent T2/FLAIR hyperintensity in the subcortical and periventricular white matter likely reflects sequela of chronic white matter microangiopathy. There are no findings of Wernicke's encephalopathy, though the mamillary bodies are not well evaluated. Vascular: Normal flow voids. Skull and upper cervical spine: Normal marrow signal. Sinuses/Orbits: The paranasal sinuses are clear. The globes and orbits are unremarkable. Other: None.  IMPRESSION: 1. Motion degraded study as above. 2. No evidence of acute intracranial pathology. Specifically, no definite evidence of Wernicke's encephalopathy, though the mamillary bodies are not well evaluated. Consider repeat imaging with intravenous contrast when the patient can tolerate, if indicated. 3. Marked global parenchymal volume loss without discernible lobar predominance, advanced for age. Electronically Signed   By: Valetta Mole M.D.   On: 03/23/2021 11:43   US Abdomen Limited RUQ (LIVER/GB)  Result Date: 03/21/2021 CLINICAL DATA:  alcoholic cirrhosis EXAM: ULTRASOUND ABDOMEN LIMITED RIGHT UPPER QUADRANT COMPARISON:  None. FINDINGS: Gallbladder: The gallbladder is nondilated without visible intraluminal stones. There is a non dependent, nonshadowing 4 mm nodular focus along the hepatic side gallbladder wall, which could be adherent sludge or small polyp, the latter of which would require no follow-up per consensus guidelines. Common bile duct: Diameter: 9 mm.  No obstructing lesion identified sonographically. Liver: Diffusely increased liver echogenicity with mildly nodular contours. Portal vein is patent on color Doppler imaging with normal direction of blood flow towards the liver. Other: None. IMPRESSION: Hepatic steatosis with mildly nodular liver contours, suggesting cirrhosis. No ascites. Dilated common bile duct measuring 9 mm, previously 10 mm on prior ultrasound in October 2021. If not already  performed, MRI/MRCP is recommended for further evaluation. Electronically Signed   By: Maurine Simmering M.D.   On: 03/21/2021 11:56

## 2021-03-28 DIAGNOSIS — I10 Essential (primary) hypertension: Secondary | ICD-10-CM | POA: Diagnosis not present

## 2021-03-28 DIAGNOSIS — R296 Repeated falls: Secondary | ICD-10-CM | POA: Diagnosis not present

## 2021-03-28 DIAGNOSIS — F1010 Alcohol abuse, uncomplicated: Secondary | ICD-10-CM | POA: Diagnosis not present

## 2021-03-28 DIAGNOSIS — F419 Anxiety disorder, unspecified: Secondary | ICD-10-CM | POA: Diagnosis not present

## 2021-03-28 LAB — BASIC METABOLIC PANEL
Anion gap: 11 (ref 5–15)
BUN: 37 mg/dL — ABNORMAL HIGH (ref 8–23)
CO2: 23 mmol/L (ref 22–32)
Calcium: 9.6 mg/dL (ref 8.9–10.3)
Chloride: 88 mmol/L — ABNORMAL LOW (ref 98–111)
Creatinine, Ser: 0.7 mg/dL (ref 0.44–1.00)
GFR, Estimated: >60 mL/min (ref >60)
Glucose, Bld: 97 mg/dL (ref 70–99)
Potassium: 4 mmol/L (ref 3.5–5.1)
Sodium: 122 mmol/L — ABNORMAL LOW (ref 135–145)

## 2021-03-28 LAB — OSMOLALITY: Osmolality: 273 mOsm/kg — ABNORMAL LOW (ref 275–295)

## 2021-03-28 LAB — OSMOLALITY, URINE: Osmolality, Ur: 522 mOsm/kg (ref 300–900)

## 2021-03-28 LAB — URIC ACID: Uric Acid, Serum: 3.4 mg/dL (ref 2.5–7.1)

## 2021-03-28 LAB — SODIUM: Sodium: 128 mmol/L — ABNORMAL LOW (ref 135–145)

## 2021-03-28 LAB — SODIUM, URINE, RANDOM: Sodium, Ur: 87 mmol/L

## 2021-03-28 MED ORDER — CHLORDIAZEPOXIDE HCL 5 MG PO CAPS
5.0000 mg | ORAL_CAPSULE | Freq: Two times a day (BID) | ORAL | Status: AC | PRN
Start: 2021-03-28 — End: 2021-03-29
  Administered 2021-03-29: 5 mg via ORAL
  Filled 2021-03-28: qty 1

## 2021-03-28 MED ORDER — TOLVAPTAN 15 MG PO TABS
15.0000 mg | ORAL_TABLET | Freq: Once | ORAL | Status: AC
  Administered 2021-03-28: 15 mg via ORAL
  Filled 2021-03-28: qty 1

## 2021-03-28 NOTE — Progress Notes (Signed)
Physical Therapy Treatment Patient Details Name: [name] MRN: [id number] DOB: August 06, 1955 Today's Date: 03/28/2021   History of Present Illness Pt presented to ED on 9/12 with etoh abuse with withdrawals and frequent falls at home. Pt also with hyponatremia. 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    PT Comments    Patient with slightly improved cognition (making attempts to avoid objects with RW, however too weak to maneuver RW herself). Continues with very slow gait, flexed posture with wide BOS and max cues for sequencing with turning. Ambulated 30 ft, seated rest, and again 30 ft with encouragement.     Recommendations for follow up therapy are one component of a multi-disciplinary discharge planning process, led by the attending physician.  Recommendations may be updated based on patient status, additional functional criteria and insurance authorization.  Follow Up Recommendations  SNF     Equipment Recommendations  None recommended by PT    Recommendations for Other Services       Precautions / Restrictions Precautions Precautions: Fall Restrictions Weight Bearing Restrictions: No     Mobility  Bed Mobility Overal bed mobility: Needs Assistance Bed Mobility: Sit to Supine     Supine to sit: Min guard;HOB elevated Sit to supine: Min guard;HOB elevated   General bed mobility comments: minguard for safety and line management    Transfers Overall transfer level: Needs assistance Equipment used: Rolling walker (2 wheeled) Transfers: Sit to/from Stand Sit to Stand: Min assist         General transfer comment: minA to powerup into standing and max cues for hand placement (tries to pull up on RW with it tipping); assist to stabilize RW; x4 reps  Ambulation/Gait Ambulation/Gait assistance: Min assist Gait Distance (Feet): 30 Feet (seated rest; 30 ft) Assistive device: Rolling walker (2 wheeled) Gait Pattern/deviations: Step-through  pattern;Decreased step length - left;Decreased step length - right;Wide base of support;Trunk flexed Gait velocity: decr Gait velocity interpretation: <1.8 ft/sec, indicate of risk for recurrent falls General Gait Details: Assist for balance. Flexed knees throughout. Assist to manage walker. Cues and assist to avoid obstacles   Stairs             Wheelchair Mobility    Modified Rankin (Stroke Patients Only)       Balance Overall balance assessment: Needs assistance Sitting-balance support: No upper extremity supported;Feet supported Sitting balance-Leahy Scale: Fair     Standing balance support: Single extremity supported;During functional activity Standing balance-Leahy Scale: Poor Standing balance comment: heavy reliance on at least single UE support and support from therapist                            Cognition Arousal/Alertness: Awake/alert Behavior During Therapy: Flat affect Overall Cognitive Status: Impaired/Different from baseline Area of Impairment: Attention;Safety/judgement;Following commands;Problem solving;Awareness;Memory                   Current Attention Level: Sustained Memory: Decreased short-term memory Following Commands: Follows one step commands with increased time Safety/Judgement: Decreased awareness of safety;Decreased awareness of deficits Awareness: Intellectual Problem Solving: Slow processing;Difficulty sequencing;Requires verbal cues;Requires tactile cues General Comments: pt with decreased safety awareness, slightly confused and required increased time for processing information. pt required moderate assistance for use of DME and maxA to sequence how to navigate around obstacles. Able to state she should call for getting on [name] for BM, however on arrival pt had been incontinent in the bed and reported she  forgot she was supposed to call for BM (she has a purewick for urine).      Exercises      General Comments         Pertinent Vitals/Pain Pain Assessment: No/denies pain    Home Living                      Prior Function            PT Goals (current goals can now be found in the care plan section) Acute Rehab PT Goals Patient Stated Goal: didn't state Time For Goal Achievement: 04/05/21 Potential to Achieve Goals: Good Progress towards PT goals: Progressing toward goals    Frequency    Min 3X/week      PT Plan Current plan remains appropriate    Co-evaluation              AM-PAC PT "6 Clicks" Mobility   Outcome Measure  Help needed turning from your back to your side while in a flat bed without using bedrails?: A Little Help needed moving from lying on your back to sitting on the side of a flat bed without using bedrails?: A Little Help needed moving to and from a bed to a chair (including a wheelchair)?: A Little Help needed standing up from a chair using your arms (e.g., wheelchair or bedside chair)?: A Little Help needed to walk in Skinner room?: A Little Help needed climbing 3-5 steps with a railing? : Total 6 Click Score: 16    End of Session Equipment Utilized During Treatment: Gait belt Activity Tolerance: Patient tolerated treatment well Patient left: in bed;with call bell/phone within reach;with bed alarm set Nurse Communication: Mobility status PT Visit Diagnosis: Unsteadiness on feet (R26.81);Repeated falls (R29.6);Ataxic gait (R26.0)     Time: 5638-9373 PT Time Calculation (min) (ACUTE ONLY): 32 min  Charges:  $Gait Training: 23-37 mins                      Arby Barrette, PT Pager [phone]    Rexanne Mano 03/28/2021, 11:33 AM

## 2021-03-28 NOTE — Progress Notes (Signed)
PROGRESS NOTE                                                                                                                                                                                                             Patient Demographics:    [name], is a 65 y.o. female, DOB - [birth year], [id number]  Outpatient Primary MD for the patient is Ann Held, DO    LOS - 6  Admit date - 03/21/2021    Chief Complaint  Patient presents with   Alcohol Problem       Brief Narrative (HPI from H&P)  -  [name] is a 65 y.o. female with medical history significant of hypertension, alcohol abuse, anxiety, and depression presents with what she reports is an alcohol problem.  Admits to drinking 4-5 beers per day on average and intermittently drinking wine for over 30 years.  Her last drink was 2 days ago.  Her friend Daine Floras was able to provide additional history over the phone. Her friend tried to take her to [facility] in [place] for alcohol rehab, but she was declined admission as she was significantly debilitated needing assistance, in the ER she was found to be in early DTs, had ataxia and difficulty in standing up and ambulating.  She was admitted for further care.   Subjective:   Patient in bed, appears comfortable, denies any headache, no fever, no chest pain or pressure, no shortness of breath , no abdominal pain. No new focal weakness.   Assessment  & Plan :     Alcohol abuse with early DTs.- Counseled to quit alcohol, CIWA protocol.  Will add scheduled Librium as well, much better.  2.  Multiple falls with some ataxia.  MRI brain nonacute however limited due to motion artifact, continue PT OT, continue replacement of thiamine and B12, B12 levels were borderline, thiamine levels were added to blood work however she has received some supplemental thiamine by that time.   Could have early Wernicke's encephalopathy as well however she has minimal to no nystagmus at this time.  Continue to monitor with [id number], folic acid and thiamine supplementation.  She is extremely weak, tried to walk her out of the bed in the room and required 2 people to get her up, she could hardly take 4-5  steps with a walker and 2 people assisting her, had very poor balance and gait.  Overall extremely weak, in no shape way or form stable or safe to go home.  3.  Hyponatremia Acute on Chronic, Hypomagnesemia, Hypokalemia .  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  4.  Hypertension.  Blood pressure medications adjusted for better control.  5.  Possible underlying alcoholic cirrhosis.  Ultrasound noted.  Acute hepatitis panel will be added.  For now stable INR and LFTs.  Outpatient GI follow-up      Condition - Fair  Family Communication  : Friend at bedside on 03/25/2021  Code Status :  Full  Consults  :  None  PUD Prophylaxis : PPI   Procedures  :     RUQ [place].  Possible cirrhosis  MRI brain - nonacute.      Disposition Plan  :    Status is: Inpatient  Remains inpatient appropriate because:IV treatments appropriate due to intensity of illness or inability to take PO  Dispo: The patient is from: Home              Anticipated d/c is to: SNF              Patient currently is not medically stable to d/c.   Difficult to place patient No  DVT Prophylaxis  :    enoxaparin (LOVENOX) injection 40 mg Start: 03/22/21 1600    Lab Results  Component Value Date   PLT 263 03/27/2021    Diet :  Diet Order             Diet Heart Room service appropriate? Yes; Fluid consistency: Thin  Diet effective now                    Inpatient Medications  Scheduled Meds:  amLODipine  10 mg Oral Daily   cyanocobalamin  1,000 mcg  Subcutaneous Daily   enoxaparin (LOVENOX) injection  40 mg Subcutaneous [id number]   folic acid  1 mg Oral Daily   metoprolol succinate  100 mg Oral Daily   multivitamin with minerals  1 tablet Oral Daily   pantoprazole  40 mg Oral Daily   sodium chloride flush  3 mL Intravenous Q12H   thiamine  500 mg Oral Q24H   tolvaptan  15 mg Oral Once   [START ON 03/30/2021] vitamin B-12  1,000 mcg Oral BID   Continuous Infusions:  thiamine injection     PRN Meds:.acetaminophen **OR** acetaminophen, albuterol, chlordiazePOXIDE, hydrALAZINE, melatonin  Antibiotics  :    Anti-infectives (From admission, onward)    None        Time Spent in minutes  30   Lala Lund M.D on 03/28/2021 at 10:59 AM  To page go to [email or web address]   [facility] -  Office  [phone]    See all Orders from today for further details    Objective:   Vitals:   03/28/21 0300 03/28/21 0400 03/28/21 0751 03/28/21 0815  BP:  109/75 124/84 129/80  Pulse: 71 65 71 71  Resp: '12 17 18 17  '$ Temp:  (!) 97.4 F (36.3 C) 97.7 F (36.5 C)   TempSrc:  Oral Oral   SpO2: 99% 100% 95% 100%  Weight:  50 kg    Height:  Wt Readings from Last 3 Encounters:  03/28/21 50 kg  03/18/21 51.8 kg  12/27/20 50.8 kg     Intake/Output Summary (Last 24 hours) at 03/28/2021 1059 Last data filed at 03/28/2021 1056 Gross per 24 hour  Intake 360 ml  Output 1450 ml  Net -1090 ml     Physical Exam  Awake Alert, No new F.N deficits, Normal affect [place].AT,PERRAL Supple Neck,No JVD, No cervical lymphadenopathy appriciated.  Symmetrical Chest wall movement, Good air movement bilaterally, CTAB RRR,No Gallops, Rubs or new Murmurs, No Parasternal Heave +ve B.Sounds, Abd Soft, No tenderness, No organomegaly appriciated, No rebound - guarding or rigidity. No Cyanosis, Clubbing or edema, No new Rash or bruise     Data Review:    CBC Recent Labs  Lab 03/21/21 1524 03/23/21 0115 03/24/21 0114 03/25/21 0057  03/26/21 0050 03/27/21 0113  WBC 5.1 6.9 5.3 5.8 7.3 6.4  HGB 14.2 14.4 13.5 12.7 12.9 12.9  HCT 41.2 39.3 37.4 35.2* 35.1* 35.9*  PLT 358 289 266 268 283 263  MCV 107.0* 104.0* 104.8* 104.5* 102.6* 104.4*  MCH 36.9* 38.1* 37.8* 37.7* 37.7* 37.5*  MCHC 34.5 36.6* 36.1* 36.1* 36.8* 35.9  RDW 12.6 12.3 12.0 12.1 11.8 12.2  LYMPHSABS 1.2  --  1.0 1.4 1.3 0.9  MONOABS 0.5  --  0.6 0.9 0.9 0.8  EOSABS 0.1  --  0.1 0.1 0.2 0.1  BASOSABS 0.1  --  0.0 0.1 0.1 0.0    Recent Labs  Lab 03/22/21 1416 03/22/21 1420 03/23/21 0115 03/23/21 1450 03/24/21 0114 03/25/21 0057 03/26/21 0050 03/26/21 0947 03/26/21 1459 03/27/21 0113 03/28/21 0337  NA  --   --    < >  --  130* 127* 127* 125* 126* 129* 122*  K  --   --    < >  --  3.4* 3.7 3.7 3.1* 4.3 4.1 4.0  CL  --   --    < >  --  95* 93* 91* 92* 93* 95* 88*  CO2  --   --    < >  --  '24 26 25 '$ 20* '25 23 23  '$ GLUCOSE  --   --    < >  --  135* 99 113* 204* 88 95 97  BUN  --   --    < >  --  <5* <5* 7* '10 10 10 '$ 37*  CREATININE  --   --    < >  --  0.72 0.66 0.83 0.90 0.79 0.83 0.70  CALCIUM  --   --    < >  --  9.6 9.2 9.2 9.2 9.4 9.5 9.6  AST  --   --   --   --  45* 38 34 37  --  33  --   ALT  --   --   --   --  '24 27 28 28  '$ --  30  --   ALKPHOS  --   --   --   --  71 55 62 56  --  55  --   BILITOT  --   --   --   --  1.0 0.8 0.9 0.6  --  0.9  --   ALBUMIN  --   --   --   --  3.8 3.4* 3.4* 3.3*  --  3.4*  --   MG  --  1.7  --   --  1.8 1.6* 1.8  --   --  1.8  --   INR  --  1.0  --   --   --   --   --   --   --   --   --   TSH  --  1.282  --   --   --   --   --   --   --   --   --   AMMONIA 12  --   --   --   --   --   --   --   --   --   --   BNP  --   --   --  84.1 98.6 82.3 64.4  --   --  29.1  --    < > = values in this interval not displayed.    ------------------------------------------------------------------------------------------------------------------ No results for input(s): CHOL, HDL, LDLCALC, TRIG, CHOLHDL, LDLDIRECT in the  last 72 hours.  Lab Results  Component Value Date   HGBA1C 5.2 06/14/2015   ------------------------------------------------------------------------------------------------------------------ No results for input(s): TSH, T4TOTAL, T3FREE, THYROIDAB in the last 72 hours.  Invalid input(s): FREET3   Cardiac Enzymes No results for input(s): CKMB, TROPONINI, MYOGLOBIN in the last 168 hours.  Invalid input(s): CK ------------------------------------------------------------------------------------------------------------------    Component Value Date/Time   BNP 29.1 03/27/2021 0113    Radiology Reports MR BRAIN WO CONTRAST  Result Date: 03/23/2021 CLINICAL DATA:  Ataxia, question Wernicke's encephalopathy EXAM: MRI HEAD WITHOUT CONTRAST TECHNIQUE: Multiplanar, multiecho pulse sequences of the brain and surrounding structures were obtained without intravenous contrast. COMPARISON:  CT head 02/21/2021 FINDINGS: The study is limited due to patient cooperation. The provided sequences are moderately motion degraded. Brain: There is no evidence of acute intracranial hemorrhage, extra-axial fluid collection, or infarct. There is marked global parenchymal volume loss with enlargement of the ventricular system, advanced for age. There is no discernible lobar predominance. Confluent T2/FLAIR hyperintensity in the subcortical and periventricular white matter likely reflects sequela of chronic white matter microangiopathy. There are no findings of Wernicke's encephalopathy, though the mamillary bodies are not well evaluated. Vascular: Normal flow voids. Skull and upper cervical spine: Normal marrow signal. Sinuses/Orbits: The paranasal sinuses are clear. The globes and orbits are unremarkable. Other: None. IMPRESSION: 1. Motion degraded study as above. 2. No evidence of acute intracranial pathology. Specifically, no definite evidence of Wernicke's encephalopathy, though the mamillary bodies are not well  evaluated. Consider repeat imaging with intravenous contrast when the patient can tolerate, if indicated. 3. Marked global parenchymal volume loss without discernible lobar predominance, advanced for age. Electronically Signed   By: Valetta Mole M.D.   On: 03/23/2021 11:43   US Abdomen Limited RUQ (LIVER/GB)  Result Date: 03/21/2021 CLINICAL DATA:  alcoholic cirrhosis EXAM: ULTRASOUND ABDOMEN LIMITED RIGHT UPPER QUADRANT COMPARISON:  None. FINDINGS: Gallbladder: The gallbladder is nondilated without visible intraluminal stones. There is a non dependent, nonshadowing 4 mm nodular focus along the hepatic side gallbladder wall, which could be adherent sludge or small polyp, the latter of which would require no follow-up per consensus guidelines. Common bile duct: Diameter: 9 mm.  No obstructing lesion identified sonographically. Liver: Diffusely increased liver echogenicity with mildly nodular contours. Portal vein is patent on color Doppler imaging with normal direction of blood flow towards the liver. Other: None. IMPRESSION: Hepatic steatosis with mildly nodular liver contours, suggesting cirrhosis. No ascites. Dilated common bile duct measuring 9 mm, previously 10 mm on prior ultrasound in October 2021. If not already performed, MRI/MRCP is recommended for further evaluation.  Electronically Signed   By: Maurine Simmering M.D.   On: 03/21/2021 11:56

## 2021-03-28 NOTE — Plan of Care (Signed)
  Problem: Education: Goal: Knowledge of General Education information will improve Description Including pain rating scale, medication(s)/side effects and non-pharmacologic comfort measures Outcome: Progressing   Problem: Health Behavior/Discharge Planning: Goal: Ability to manage health-related needs will improve Outcome: Progressing   

## 2021-03-28 NOTE — Progress Notes (Signed)
[place] for tolvaptan  Indication: hyponatremia  65 yo F with HTH, alcohol abuse, anxiety, and depression here for alcohol withdraw. Consistently hyponatremic during admission after multiple attempts to correct Na with fluids - MD notes SIADH. She received one dose of tolvaptan on 9/17 with improvement in Na the following day. Na down to low 120s again.   9/17 Na 125 - tolvaptan 15 mg PO x1 9/18 Na 129 9/19 Na 122 - tolvaptan 15 mg PO x1  Plan: Give tolvaptan '15mg'$  PO x 1 dose  Continue to follow Na  Thank you for involving pharmacy in this patient's care.  Renold Genta, PharmD, BCPS Clinical Pharmacist Clinical phone for 03/28/2021 until 3p is [phone] 03/28/2021 11:14 AM  **Pharmacist phone directory can be found on [email or web address] listed under [place]**

## 2021-03-29 ENCOUNTER — Telehealth: Payer: BC Managed Care – PPO

## 2021-03-29 DIAGNOSIS — R296 Repeated falls: Secondary | ICD-10-CM | POA: Diagnosis not present

## 2021-03-29 DIAGNOSIS — F1010 Alcohol abuse, uncomplicated: Secondary | ICD-10-CM | POA: Diagnosis not present

## 2021-03-29 DIAGNOSIS — I10 Essential (primary) hypertension: Secondary | ICD-10-CM | POA: Diagnosis not present

## 2021-03-29 DIAGNOSIS — F419 Anxiety disorder, unspecified: Secondary | ICD-10-CM | POA: Diagnosis not present

## 2021-03-29 LAB — BASIC METABOLIC PANEL
Anion gap: 13 (ref 5–15)
BUN: 17 mg/dL (ref 8–23)
CO2: 24 mmol/L (ref 22–32)
Calcium: 9.8 mg/dL (ref 8.9–10.3)
Chloride: 90 mmol/L — ABNORMAL LOW (ref 98–111)
Creatinine, Ser: 0.74 mg/dL (ref 0.44–1.00)
GFR, Estimated: >60 mL/min (ref >60)
Glucose, Bld: 98 mg/dL (ref 70–99)
Potassium: 3.9 mmol/L (ref 3.5–5.1)
Sodium: 127 mmol/L — ABNORMAL LOW (ref 135–145)

## 2021-03-29 LAB — SODIUM: Sodium: 128 mmol/L — ABNORMAL LOW (ref 135–145)

## 2021-03-29 MED ORDER — TOLVAPTAN 15 MG PO TABS
15.0000 mg | ORAL_TABLET | Freq: Once | ORAL | Status: AC
  Administered 2021-03-29: 15 mg via ORAL
  Filled 2021-03-29: qty 1

## 2021-03-29 NOTE — Progress Notes (Signed)
Physical Therapy Treatment Patient Details Name: [name] MRN: [id number] DOB: April 07, 1956 Today's Date: 03/29/2021   History of Present Illness Pt presented to ED on 9/12 with etoh abuse with withdrawals and frequent falls at home. Pt also with hyponatremia. 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    PT Comments    Patient up in chair on arrival, more alert and eager to ambulate. Better upright posture and able to increase ambulation distance with standing rest breaks.     Recommendations for follow up therapy are one component of a multi-disciplinary discharge planning process, led by the attending physician.  Recommendations may be updated based on patient status, additional functional criteria and insurance authorization.  Follow Up Recommendations  SNF     Equipment Recommendations  None recommended by PT    Recommendations for Other Services       Precautions / Restrictions Precautions Precautions: Fall     Mobility  Bed Mobility               General bed mobility comments: up in recliner    Transfers Overall transfer level: Needs assistance Equipment used: Rolling walker (2 wheeled) Transfers: Sit to/from Stand Sit to Stand: Min guard         General transfer comment: vc for correct use of RW/hand placement  Ambulation/Gait Ambulation/Gait assistance: Min assist Gait Distance (Feet): 50 Feet (stand rest, 25, stand rest, 15) Assistive device: Rolling walker (2 wheeled) Gait Pattern/deviations: Step-through pattern;Decreased step length - left;Decreased step length - right;Wide base of support;Trunk flexed Gait velocity: decr   General Gait Details: better upright posture (although still flexed); pt able to maneuver RW herself, including during 180 degree turn   Stairs             Wheelchair Mobility    Modified Rankin (Stroke Patients Only)       Balance Overall balance assessment: Needs  assistance Sitting-balance support: No upper extremity supported;Feet supported Sitting balance-Leahy Scale: Fair     Standing balance support: Single extremity supported;During functional activity Standing balance-Leahy Scale: Poor Standing balance comment: heavy reliance on at least single UE support and support from therapist                            Cognition Arousal/Alertness: Awake/alert Behavior During Therapy: Flat affect Overall Cognitive Status: Impaired/Different from baseline Area of Impairment: Following commands;Problem solving;Memory;Awareness                     Memory: Decreased short-term memory Following Commands: Follows one step commands with increased time   Awareness: Intellectual;Emergent Problem Solving: Slow processing;Difficulty sequencing;Requires verbal cues;Requires tactile cues General Comments: required increased time for processing information; able to state she was getting tired when walking and use standing rest break appropriately      Exercises      General Comments General comments (skin integrity, edema, etc.): Friend arrived at beginning of session and pt perseverated on asking her about getting to see her dog before she moves to SNF      Pertinent Vitals/Pain Pain Assessment: No/denies pain    Home Living                      Prior Function            PT Goals (current goals can now be found in the care plan section) Acute Rehab PT Goals Patient Stated Goal:  wants to get well and be able to care for her dog PT Goal Formulation: With patient Time For Goal Achievement: 04/05/21 Potential to Achieve Goals: Good Progress towards PT goals: Progressing toward goals    Frequency    Min 3X/week      PT Plan Current plan remains appropriate    Co-evaluation              AM-PAC PT "6 Clicks" Mobility   Outcome Measure  Help needed turning from your back to your side while in a flat bed  without using bedrails?: A Little Help needed moving from lying on your back to sitting on the side of a flat bed without using bedrails?: A Little Help needed moving to and from a bed to a chair (including a wheelchair)?: A Little Help needed standing up from a chair using your arms (e.g., wheelchair or bedside chair)?: A Little Help needed to walk in hospital room?: A Little Help needed climbing 3-5 steps with a railing? : Total 6 Click Score: 16    End of Session Equipment Utilized During Treatment: Gait belt Activity Tolerance: Patient tolerated treatment well Patient left: in chair;with call bell/phone within reach;with chair alarm set;with family/visitor present Nurse Communication: Mobility status;Other (comment) (still wearing brief used during ambulation) PT Visit Diagnosis: Unsteadiness on feet (R26.81);Repeated falls (R29.6);Ataxic gait (R26.0)     Time: 3888-2800 PT Time Calculation (min) (ACUTE ONLY): 23 min  Charges:  $Gait Training: 23-37 mins                      Arby Barrette, PT Pager [phone]    Rexanne Mano 03/29/2021, 4:05 PM

## 2021-03-29 NOTE — Progress Notes (Signed)
PROGRESS NOTE                                                                                                                                                                                                             Patient Demographics:    [name], is a 65 y.o. female, DOB - [birth year], LHT:[id number]  Outpatient Primary MD for the patient is Ann Held, DO    LOS - 7  Admit date - 03/21/2021    Chief Complaint  Patient presents with   Alcohol Problem       Brief Narrative (HPI from H&P)  -  [name] is a 65 y.o. female with medical history significant of hypertension, alcohol abuse, anxiety, and depression presents with what she reports is an alcohol problem.  Admits to drinking 4-5 beers per day on average and intermittently drinking wine for over 30 years.  Her last drink was 2 days ago.  Her friend Daine Floras was able to provide additional history over the phone. Her friend tried to take her to [facility] in [place] for alcohol rehab, but she was declined admission as she was significantly debilitated needing assistance, in the ER she was found to be in early DTs, had ataxia and difficulty in standing up and ambulating.  She was admitted for further care.   Subjective:   Patient in bed, appears comfortable, denies any headache, no fever, no chest pain or pressure, no shortness of breath , no abdominal pain. No new focal weakness.   Assessment  & Plan :     Alcohol abuse with early DTs.- Counseled to quit alcohol, was placed on Librium and CIWA protocol, this problem has resolved.  2.  Multiple falls with some ataxia.  MRI brain nonacute however limited due to motion artifact, continue PT OT, continue replacement of thiamine and B12, B12 levels were borderline, thiamine levels were added to blood work however she has received some supplemental thiamine by that  time, thiamine levels still pending.  Could have early Wernicke's encephalopathy as well however she has minimal to no nystagmus at this time.  Continue to monitor with X72, folic acid and thiamine supplementation.    She is extremely weak, lives alone and will require SNF, continue PT OT, gradual improvement in her balance and strength.  3.  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  replaced K &  Mag IV, Na worse after IVF, clinically euvolemic , U.Na> 70, U Osm >> S Osm, SIADH, post single dose Samsca on 03/26/21 with 125>>129, dropped again 03/28/21 when Samsca was held, Na improving again after Samsca dose on 03/28/2021, repeat Samsca again on 03/29/2021.  4.  Hypertension.  Blood pressure medications adjusted for better control.  Currently on combination of Norvasc, ACE inhibitor and beta-blocker.  5.  Possible underlying alcoholic cirrhosis.  Ultrasound noted.  Acute hepatitis panel will be added.  For now stable INR and LFTs.  Outpatient GI follow-up      Condition - Fair  Family Communication  : Friend at bedside on 03/25/2021  Code Status :  Full  Consults  :  None  PUD Prophylaxis : PPI   Procedures  :     RUQ [place].  Possible cirrhosis  MRI brain - nonacute.      Disposition Plan  :    Status is: Inpatient  Remains inpatient appropriate because:IV treatments appropriate due to intensity of illness or inability to take PO  Dispo: The patient is from: Home              Anticipated d/c is to: SNF              Patient currently is not medically stable to d/c.   Difficult to place patient No  DVT Prophylaxis  :    enoxaparin (LOVENOX) injection 40 mg Start: 03/22/21 1600    Lab Results  Component Value Date   PLT 263 03/27/2021    Diet :  Diet Order             Diet Heart Room service appropriate? Yes; Fluid consistency: Thin  Diet effective now                    Inpatient Medications  Scheduled Meds:  amLODipine  10 mg  Oral Daily   enoxaparin (LOVENOX) injection  40 mg Subcutaneous S85I   folic acid  1 mg Oral Daily   metoprolol succinate  100 mg Oral Daily   multivitamin with minerals  1 tablet Oral Daily   pantoprazole  40 mg Oral Daily   sodium chloride flush  3 mL Intravenous Q12H   thiamine  500 mg Oral Q24H   [START ON 03/30/2021] vitamin B-12  1,000 mcg Oral BID   Continuous Infusions:  thiamine injection     PRN Meds:.acetaminophen **OR** acetaminophen, albuterol, chlordiazePOXIDE, hydrALAZINE, melatonin  Antibiotics  :    Anti-infectives (From admission, onward)    None        Time Spent in minutes  30   Lala Lund M.D on 03/29/2021 at 10:08 AM  To page go to [email or web address]   [facility] -  Office  [phone]    See all Orders from today for further details    Objective:   Vitals:   03/28/21 2045 03/28/21 2350 03/29/21 0400 03/29/21 0736  BP: 122/87 108/75 132/79 (!) 163/94  Pulse: 74 68 68 100  Resp: 20 16 15 20   Temp: 97.7 F (36.5 C) 97.7 F (36.5 C) 97.8 F (36.6 C) 97.6 F (36.4 C)  TempSrc: Oral Oral Oral Oral  SpO2: 99% 100% 100% 97%  Weight:      Height:        Wt Readings from Last 3 Encounters:  03/28/21 50 kg  03/18/21 51.8 kg  12/27/20 50.8  kg     Intake/Output Summary (Last 24 hours) at 03/29/2021 1008 Last data filed at 03/29/2021 0949 Gross per 24 hour  Intake 838 ml  Output 1350 ml  Net -512 ml     Physical Exam  Awake Alert, No new F.N deficits, Normal affect .AT,PERRAL Supple Neck,No JVD, No cervical lymphadenopathy appriciated.  Symmetrical Chest wall movement, Good air movement bilaterally, CTAB RRR,No Gallops, Rubs or new Murmurs, No Parasternal Heave +ve B.Sounds, Abd Soft, No tenderness, No organomegaly appriciated, No rebound - guarding or rigidity. No Cyanosis, Clubbing or edema, No new Rash or bruise     Data Review:    CBC Recent Labs  Lab 03/23/21 0115 03/24/21 0114 03/25/21 0057 03/26/21 0050  03/27/21 0113  WBC 6.9 5.3 5.8 7.3 6.4  HGB 14.4 13.5 12.7 12.9 12.9  HCT 39.3 37.4 35.2* 35.1* 35.9*  PLT 289 266 268 283 263  MCV 104.0* 104.8* 104.5* 102.6* 104.4*  MCH 38.1* 37.8* 37.7* 37.7* 37.5*  MCHC 36.6* 36.1* 36.1* 36.8* 35.9  RDW 12.3 12.0 12.1 11.8 12.2  LYMPHSABS  --  1.0 1.4 1.3 0.9  MONOABS  --  0.6 0.9 0.9 0.8  EOSABS  --  0.1 0.1 0.2 0.1  BASOSABS  --  0.0 0.1 0.1 0.0    Recent Labs  Lab 03/22/21 1416 03/22/21 1420 03/23/21 0115 03/23/21 1450 03/24/21 0114 03/25/21 0057 03/26/21 0050 03/26/21 0947 03/26/21 1459 03/27/21 0113 03/28/21 0337 03/28/21 1700 03/29/21 0248  NA  --   --    < >  --  130* 127* 127* 125* 126* 129* 122* 128* 127*  K  --   --    < >  --  3.4* 3.7 3.7 3.1* 4.3 4.1 4.0  --  3.9  CL  --   --    < >  --  95* 93* 91* 92* 93* 95* 88*  --  90*  CO2  --   --    < >  --  24 26 25  20* 25 23 23   --  24  GLUCOSE  --   --    < >  --  135* 99 113* 204* 88 95 97  --  98  BUN  --   --    < >  --  <5* <5* 7* 10 10 10  37*  --  17  CREATININE  --   --    < >  --  0.72 0.66 0.83 0.90 0.79 0.83 0.70  --  0.74  CALCIUM  --   --    < >  --  9.6 9.2 9.2 9.2 9.4 9.5 9.6  --  9.8  AST  --   --   --   --  45* 38 34 37  --  33  --   --   --   ALT  --   --   --   --  24 27 28 28   --  30  --   --   --   ALKPHOS  --   --   --   --  71 55 62 56  --  55  --   --   --   BILITOT  --   --   --   --  1.0 0.8 0.9 0.6  --  0.9  --   --   --   ALBUMIN  --   --   --   --  3.8 3.4* 3.4* 3.3*  --  3.4*  --   --   --   MG  --  1.7  --   --  1.8 1.6* 1.8  --   --  1.8  --   --   --   INR  --  1.0  --   --   --   --   --   --   --   --   --   --   --   TSH  --  1.282  --   --   --   --   --   --   --   --   --   --   --   AMMONIA 12  --   --   --   --   --   --   --   --   --   --   --   --   BNP  --   --   --  84.1 98.6 82.3 64.4  --   --  29.1  --   --   --    < > = values in this interval not displayed.     ------------------------------------------------------------------------------------------------------------------ No results for input(s): CHOL, HDL, LDLCALC, TRIG, CHOLHDL, LDLDIRECT in the last 72 hours.  Lab Results  Component Value Date   HGBA1C 5.2 06/14/2015   ------------------------------------------------------------------------------------------------------------------ No results for input(s): TSH, T4TOTAL, T3FREE, THYROIDAB in the last 72 hours.  Invalid input(s): FREET3   Cardiac Enzymes No results for input(s): CKMB, TROPONINI, MYOGLOBIN in the last 168 hours.  Invalid input(s): CK ------------------------------------------------------------------------------------------------------------------    Component Value Date/Time   BNP 29.1 03/27/2021 0113    Radiology Reports MR BRAIN WO CONTRAST  Result Date: 03/23/2021 CLINICAL DATA:  Ataxia, question Wernicke's encephalopathy EXAM: MRI HEAD WITHOUT CONTRAST TECHNIQUE: Multiplanar, multiecho pulse sequences of the brain and surrounding structures were obtained without intravenous contrast. COMPARISON:  CT head 02/21/2021 FINDINGS: The study is limited due to patient cooperation. The provided sequences are moderately motion degraded. Brain: There is no evidence of acute intracranial hemorrhage, extra-axial fluid collection, or infarct. There is marked global parenchymal volume loss with enlargement of the ventricular system, advanced for age. There is no discernible lobar predominance. Confluent T2/FLAIR hyperintensity in the subcortical and periventricular white matter likely reflects sequela of chronic white matter microangiopathy. There are no findings of Wernicke's encephalopathy, though the mamillary bodies are not well evaluated. Vascular: Normal flow voids. Skull and upper cervical spine: Normal marrow signal. Sinuses/Orbits: The paranasal sinuses are clear. The globes and orbits are unremarkable. Other: None.  IMPRESSION: 1. Motion degraded study as above. 2. No evidence of acute intracranial pathology. Specifically, no definite evidence of Wernicke's encephalopathy, though the mamillary bodies are not well evaluated. Consider repeat imaging with intravenous contrast when the patient can tolerate, if indicated. 3. Marked global parenchymal volume loss without discernible lobar predominance, advanced for age. Electronically Signed   By: Valetta Mole M.D.   On: 03/23/2021 11:43   US Abdomen Limited RUQ (LIVER/GB)  Result Date: 03/21/2021 CLINICAL DATA:  alcoholic cirrhosis EXAM: ULTRASOUND ABDOMEN LIMITED RIGHT UPPER QUADRANT COMPARISON:  None. FINDINGS: Gallbladder: The gallbladder is nondilated without visible intraluminal stones. There is a non dependent, nonshadowing 4 mm nodular focus along the hepatic side gallbladder wall, which could be adherent sludge or small polyp, the latter of which would require no follow-up per consensus guidelines. Common bile duct: Diameter: 9 mm.  No obstructing lesion identified sonographically. Liver: Diffusely increased liver echogenicity  with mildly nodular contours. Portal vein is patent on color Doppler imaging with normal direction of blood flow towards the liver. Other: None. IMPRESSION: Hepatic steatosis with mildly nodular liver contours, suggesting cirrhosis. No ascites. Dilated common bile duct measuring 9 mm, previously 10 mm on prior ultrasound in October 2021. If not already performed, MRI/MRCP is recommended for further evaluation. Electronically Signed   By: Maurine Simmering M.D.   On: 03/21/2021 11:56

## 2021-03-29 NOTE — Plan of Care (Signed)
  Problem: Education: Goal: Knowledge of General Education information will improve Description Including pain rating scale, medication(s)/side effects and non-pharmacologic comfort measures Outcome: Progressing   Problem: Health Behavior/Discharge Planning: Goal: Ability to manage health-related needs will improve Outcome: Progressing   

## 2021-03-29 NOTE — TOC Progression Note (Addendum)
Transition of Care [facility]) - Progression Note    Patient Details  Name: [name] MRN: [id number] Date of Birth: 24-May-1956  Transition [facility]) CM/SW [place], LCSW Phone Number: 03/29/2021, 12:16 PM  Clinical Narrative:    12:16pmCSW spoke with patient and her [name] and provided update on SNF. [employer] still Garment/textile technologist still pending as well.   Patient asked CSW what would happen if she wanted to return home instead at discharge. CSW alerted her that she could but would need to be safe and have family support to get stronger.   12:57pm-CSW has received denial from [facility] for SNF. CSW contacted number for appeals as instructed ([phone] ex. [phone], Ref# [id number]) and voicemail stated they were closed due to department event. CSW left voicemail for return call.   2pm-CSW contacted [place] again and waited on hold, then message stated they were unable to complete request due to an issue.  3pm-CSW contacted [place] again and finally spoke with [name] there. She reviewed case and stated that the next step is to fax clinicals as they had not received updated therapy note from yesterday. CSW faxed clinicals to [phone]. [name] stated their office will review and make a determination.    Expected Discharge Plan: [place] Barriers to Discharge: Continued Medical Work up  Expected Discharge Plan and Services Expected Discharge Plan: [place] In-house Referral: Clinical Social Work Discharge Planning Services: CM Consult   Living arrangements for the past 2 months: Apartment                                       Social Determinants of Health (SDOH) Interventions    Readmission Risk Interventions No flowsheet data found.

## 2021-03-30 DIAGNOSIS — F10230 Alcohol dependence with withdrawal, uncomplicated: Secondary | ICD-10-CM | POA: Diagnosis not present

## 2021-03-30 DIAGNOSIS — E871 Hypo-osmolality and hyponatremia: Secondary | ICD-10-CM | POA: Diagnosis not present

## 2021-03-30 DIAGNOSIS — I10 Essential (primary) hypertension: Secondary | ICD-10-CM | POA: Diagnosis not present

## 2021-03-30 LAB — BASIC METABOLIC PANEL WITH GFR
Anion gap: 12 (ref 5–15)
BUN: 12 mg/dL (ref 8–23)
CO2: 24 mmol/L (ref 22–32)
Calcium: 9.7 mg/dL (ref 8.9–10.3)
Chloride: 92 mmol/L — ABNORMAL LOW (ref 98–111)
Creatinine, Ser: 0.7 mg/dL (ref 0.44–1.00)
GFR, Estimated: >60 mL/min
Glucose, Bld: 101 mg/dL — ABNORMAL HIGH (ref 70–99)
Potassium: 3.8 mmol/L (ref 3.5–5.1)
Sodium: 128 mmol/L — ABNORMAL LOW (ref 135–145)

## 2021-03-30 LAB — BASIC METABOLIC PANEL
Anion gap: 11 (ref 5–15)
BUN: 13 mg/dL (ref 8–23)
CO2: 25 mmol/L (ref 22–32)
Calcium: 9.4 mg/dL (ref 8.9–10.3)
Chloride: 91 mmol/L — ABNORMAL LOW (ref 98–111)
Creatinine, Ser: 0.78 mg/dL (ref 0.44–1.00)
GFR, Estimated: >60 mL/min (ref >60)
Glucose, Bld: 94 mg/dL (ref 70–99)
Potassium: 3.7 mmol/L (ref 3.5–5.1)
Sodium: 127 mmol/L — ABNORMAL LOW (ref 135–145)

## 2021-03-30 LAB — VITAMIN B1: Vitamin B1 (Thiamine): 223.1 nmol/L — ABNORMAL HIGH (ref 66.5–200.0)

## 2021-03-30 MED ORDER — MAGNESIUM SULFATE 2 GM/50ML IV SOLN
2.0000 g | Freq: Once | INTRAVENOUS | Status: AC
  Administered 2021-03-30: 2 g via INTRAVENOUS
  Filled 2021-03-30: qty 50

## 2021-03-30 MED ORDER — METOPROLOL SUCCINATE ER 50 MG PO TB24
50.0000 mg | ORAL_TABLET | Freq: Every day | ORAL | Status: DC
  Administered 2021-03-31 – 2021-04-03 (×4): 50 mg via ORAL
  Filled 2021-03-30 (×4): qty 1

## 2021-03-30 NOTE — Progress Notes (Signed)
Progress Note    [name]  LEX:[id number] DOB: Jun 29, 1956  DOA: 03/21/2021 PCP: Nicole Held, DO    Brief Narrative:     Medical records reviewed and are as summarized below:  [name] is an 65 y.o. female with medical history significant of hypertension, alcohol abuse, anxiety, and depression presents with what she reports is an alcohol problem.  Admits to drinking 4-5 beers per day on average and intermittently drinking wine for over 30 years.  Her last drink was 2 days ago.  Her friend [name] was able to provide additional history over the phone. Her friend tried to take her to [facility] in [place] for alcohol rehab, but she was declined admission as she was significantly debilitated needing assistance, in the ER she was found to be in early DTs, had ataxia and difficulty in standing up and ambulating.  She was admitted for further care.  Assessment/Plan:   Principal Problem:   Alcohol withdrawal ([place]) Active Problems:   Essential hypertension   Hyponatremia   Anxiety and depression   Gait disturbance   Falls frequently   Alcohol abuse   Alcohol abuse with early DTs.- Counseled to quit alcohol, was placed on Librium and CIWA protocol, this problem has resolved.   Multiple falls with some ataxia.  MRI brain nonacute however limited due to motion artifact, continue PT OT, continue replacement of thiamine and B12, B12 levels were borderline, thiamine levels were added to blood work however she has received some supplemental thiamine by that time, thiamine levels ok  Could have early Wernicke's encephalopathy as well however she has minimal to no nystagmus at this time.  Continue to monitor with S49, folic acid and thiamine supplementation.     She is extremely weak, lives alone and will require SNF, continue PT OT, gradual improvement in her balance and strength.     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  replaced K &  Mag IV,  clinically euvolemic , U.Na> 70, U Osm >> S Osm, SIADH, post single dose Samsca on 03/26/21 with 125>>129, dropped again 03/28/21 when Samsca was Skinner, Na improving again after Samsca dose on 03/28/2021, repeat Samsca again on 03/29/2021. -fluid restrict   Hypertension.  Blood pressure medications adjusted as BP lower end of normal   possible underlying alcoholic cirrhosis.  Ultrasound noted.  stable INR and LFTs.  Outpatient GI follow-up   Family Communication/Anticipated D/C date and plan/Code Status   DVT prophylaxis: Lovenox ordered. Code Status: Full Code.  Disposition Plan: Status is: Inpatient  Remains inpatient appropriate because:Inpatient level of care appropriate due to severity of illness  Dispo: The patient is from: Home              Anticipated d/c is to: SNF              Patient currently is medically stable to d/c.   Difficult to place patient No         Medical Consultants:   None.     Subjective:   No SOB, no CP  Objective:    Vitals:   03/29/21 2352 03/30/21 0400 03/30/21 0817 03/30/21 0959  BP: (!) 123/96 125/80 113/89 117/72  Pulse: 86 64 80 86  Resp: 16 14 16    Temp: 97.7 F (36.5 C) 98 F (36.7 C) 97.7 F (36.5 C)   TempSrc: Oral Oral Oral   SpO2: 95% 95% 100%   Weight:  Height:        Intake/Output Summary (Last 24 hours) at 03/30/2021 1319 Last data filed at 03/30/2021 0750 Gross per 24 hour  Intake 360 ml  Output 250 ml  Net 110 ml   Filed Weights   03/22/21 2344 03/27/21 0315 03/28/21 0400  Weight: 47.5 kg 47.7 kg 50 kg    Exam:  General: Appearance:    Well developed, well nourished female in no acute distress     Lungs:     respirations unlabored  Heart:    Normal heart rate. Normal rhythm. No murmurs, rubs, or gallops.    MS:   All extremities are intact.    Neurologic:   Awake, alert, oriented x 3. No apparent focal neurological           defect.      Data  Reviewed:   I have personally reviewed following labs and imaging studies:  Labs: Labs show the following:   Basic Metabolic Panel: Recent Labs  Lab 03/24/21 0114 03/25/21 0057 03/26/21 0050 03/26/21 0947 03/26/21 1459 03/27/21 0113 03/28/21 0337 03/28/21 1700 03/29/21 0248 03/29/21 1430 03/30/21 0157  NA 130* 127* 127*   < > 126* 129* 122* 128* 127* 128* 128*  K 3.4* 3.7 3.7   < > 4.3 4.1 4.0  --  3.9  --  3.8  CL 95* 93* 91*   < > 93* 95* 88*  --  90*  --  92*  CO2 24 26 25    < > 25 23 23   --  24  --  24  GLUCOSE 135* 99 113*   < > 88 95 97  --  98  --  101*  BUN <5* <5* 7*   < > 10 10 37*  --  17  --  12  CREATININE 0.72 0.66 0.83   < > 0.79 0.83 0.70  --  0.74  --  0.70  CALCIUM 9.6 9.2 9.2   < > 9.4 9.5 9.6  --  9.8  --  9.7  MG 1.8 1.6* 1.8  --   --  1.8  --   --   --   --   --    < > = values in this interval not displayed.   GFR Estimated Creatinine Clearance: 51 mL/min (by C-G formula based on SCr of 0.7 mg/dL). Liver Function Tests: Recent Labs  Lab 03/24/21 0114 03/25/21 0057 03/26/21 0050 03/26/21 0947 03/27/21 0113  AST 45* 38 34 37 33  ALT 24 27 28 28 30   ALKPHOS 71 55 62 56 55  BILITOT 1.0 0.8 0.9 0.6 0.9  PROT 6.4* 5.9* 6.2* 6.2* 5.9*  ALBUMIN 3.8 3.4* 3.4* 3.3* 3.4*   No results for input(s): LIPASE, AMYLASE in the last 168 hours. No results for input(s): AMMONIA in the last 168 hours. Coagulation profile No results for input(s): INR, PROTIME in the last 168 hours.  CBC: Recent Labs  Lab 03/24/21 0114 03/25/21 0057 03/26/21 0050 03/27/21 0113  WBC 5.3 5.8 7.3 6.4  NEUTROABS 3.6 3.3 4.8 4.5  HGB 13.5 12.7 12.9 12.9  HCT 37.4 35.2* 35.1* 35.9*  MCV 104.8* 104.5* 102.6* 104.4*  PLT 266 268 283 263   Cardiac Enzymes: No results for input(s): CKTOTAL, CKMB, CKMBINDEX, TROPONINI in the last 168 hours. BNP (last 3 results) No results for input(s): PROBNP in the last 8760 hours. CBG: No results for input(s): GLUCAP in the last 168  hours. D-Dimer: No results for input(s): DDIMER  in the last 72 hours. Hgb A1c: No results for input(s): HGBA1C in the last 72 hours. Lipid Profile: No results for input(s): CHOL, HDL, LDLCALC, TRIG, CHOLHDL, LDLDIRECT in the last 72 hours. Thyroid function studies: No results for input(s): TSH, T4TOTAL, T3FREE, THYROIDAB in the last 72 hours.  Invalid input(s): FREET3 Anemia work up: No results for input(s): VITAMINB12, FOLATE, FERRITIN, TIBC, IRON, RETICCTPCT in the last 72 hours. Sepsis Labs: Recent Labs  Lab 03/24/21 0114 03/25/21 0057 03/26/21 0050 03/27/21 0113  WBC 5.3 5.8 7.3 6.4    Microbiology Recent Results (from the past 240 hour(s))  SARS CORONAVIRUS 2 (TAT 6-24 HRS) Nasopharyngeal Nasopharyngeal Swab     Status: None   Collection Time: 03/25/21  4:00 AM   Specimen: Nasopharyngeal Swab  Result Value Ref Range Status   SARS Coronavirus 2 NEGATIVE NEGATIVE Final    Comment: (NOTE) SARS-CoV-2 target nucleic acids are NOT DETECTED.  The SARS-CoV-2 RNA is generally detectable in upper and lower respiratory specimens during the acute phase of infection. Negative results do not preclude SARS-CoV-2 infection, do not rule out co-infections with other pathogens, and should not be used as the sole basis for treatment or other patient management decisions. Negative results must be combined with clinical observations, patient history, and epidemiological information. The expected result is Negative.  Fact Sheet for Patients: SugarRoll.be  Fact Sheet for Healthcare Providers: https://www.woods-mathews.com/  This test is not yet approved or cleared by the [place] FDA and  has been authorized for detection and/or diagnosis of SARS-CoV-2 by FDA under an Emergency Use Authorization (EUA). This EUA will remain  in effect (meaning this test can be used) for the duration of the COVID-19 declaration under Se ction 564(b)(1) of  the Act, 21 U.S.C. section 360bbb-3(b)(1), unless the authorization is terminated or revoked sooner.  Performed at [facility], [place] [address] [place], [place] [address]     Procedures and diagnostic studies:  No results found.  Medications:    enoxaparin (LOVENOX) injection  40 mg Subcutaneous P50D   folic acid  1 mg Oral Daily   [START ON 03/31/2021] metoprolol succinate  50 mg Oral Daily   multivitamin with minerals  1 tablet Oral Daily   pantoprazole  40 mg Oral Daily   sodium chloride flush  3 mL Intravenous Q12H   thiamine  500 mg Oral Q24H   vitamin B-12  1,000 mcg Oral BID   Continuous Infusions:  thiamine injection       LOS: 8 days   Geradine Girt  [facility]   How to contact the [facility] Attending or Consulting provider [place] or covering provider during after hours [place], for this patient?  Check the care team in [facility] and look for a) attending/consulting TRH provider listed and b) the [facility] team listed Log into [email or web address] and use [place]'s universal password to access. If you do not have the password, please contact the hospital operator. Locate the [facility] provider you are looking for under [facility] and page to a number that you can be directly reached. If you still have difficulty reaching the provider, please page the [facility] (Director on Call) for the Hospitalists listed on amion for assistance.  03/30/2021, 1:19 PM

## 2021-03-30 NOTE — Progress Notes (Signed)
Occupational Therapy Treatment Patient Details Name: [name] MRN: [id number] DOB: 1956/04/21 Today's Date: 03/30/2021   History of present illness Pt presented to ED on 9/12 with etoh abuse with withdrawals and frequent falls at home. Pt also with hyponatremia. 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   OT comments  Patient received in bed and asked for a depends while up. Patient was able to donn footwear and depends while seated. Patient performed toilet transfer with frequent cues for safety and hand placement and required assistance with clothing management.  Patient stood at sink to perform grooming tasks and was min guard to min assist for balance due to unsteady balance. Patient was left in chair and nursing notified. Acute OT to continue to follow.    Recommendations for follow up therapy are one component of a multi-disciplinary discharge planning process, led by the attending physician.  Recommendations may be updated based on patient status, additional functional criteria and insurance authorization.    Follow Up Recommendations  SNF;Supervision/Assistance - 24 hour    Equipment Recommendations  3 in 1 bedside commode    Recommendations for Other Services      Precautions / Restrictions Precautions Precautions: Fall       Mobility Bed Mobility Overal bed mobility: Needs Assistance Bed Mobility: Supine to Sit     Supine to sit: Min guard;HOB elevated     General bed mobility comments: left patient up in recliener    Transfers Overall transfer level: Needs assistance Equipment used: Rolling walker (2 wheeled) Transfers: Sit to/from Stand Sit to Stand: Min guard         General transfer comment: vc for correct use of RW/hand placement    Balance Overall balance assessment: Needs assistance Sitting-balance support: No upper extremity supported;Feet supported Sitting balance-Leahy Scale: Fair     Standing balance support: Single  extremity supported;During functional activity Standing balance-Leahy Scale: Poor Standing balance comment: able to stand at sink for grooming with unsafe balance                           ADL either performed or assessed with clinical judgement   ADL Overall ADL's : Needs assistance/impaired     Grooming: Wash/dry hands;Wash/dry face;Oral care;Brushing hair;Standing;Min guard;Minimal assistance Grooming Details (indicate cue type and reason): min guard to min assist for balance while standing at sink             Lower Body Dressing: Sitting/lateral leans;Supervision/safety Lower Body Dressing Details (indicate cue type and reason): supervision to donn foot wear and pullup brief Toilet Transfer: Minimal assistance;BSC;RW Toilet Transfer Details (indicate cue type and reason): cues for safety with transfer [place] and Hygiene: Moderate assistance;Sit to/from stand Toileting - Clothing Manipulation Details (indicate cue type and reason): required assistance for clothing management     Functional mobility during ADLs: Min guard;Minimal assistance;Rolling walker General ADL Comments: required frequent cues for correct walker use with mobility and transfers     Vision       Perception     Praxis      Cognition Arousal/Alertness: Awake/alert Behavior During Therapy: Flat affect;Impulsive Overall Cognitive Status: Impaired/Different from baseline Area of Impairment: Following commands;Problem solving;Memory;Awareness                 Orientation Level: Person;Place;Time Current Attention Level: Sustained Memory: Decreased short-term memory Following Commands: Follows one step commands with increased time Safety/Judgement: Decreased awareness of safety;Decreased awareness of deficits  Awareness: Intellectual;Emergent Problem Solving: Slow processing;Difficulty sequencing;Requires verbal cues;Requires tactile cues General Comments:  impulsive and required frequent cues for safety        Exercises     Shoulder Instructions       General Comments      Pertinent Vitals/ Pain       Pain Assessment: No/denies pain  Home Living                                          Prior Functioning/Environment              Frequency  Min 2X/week        Progress Toward Goals  OT Goals(current goals can now be found in the care plan section)  Progress towards OT goals: Progressing toward goals  Acute Rehab OT Goals Patient Stated Goal: wants to get well and be able to care for her dog OT Goal Formulation: With patient Time For Goal Achievement: 04/06/21 Potential to Achieve Goals: Good ADL Goals Pt Will Perform Grooming: standing;with supervision Pt Will Perform Lower Body Dressing: with supervision;sit to/from stand Pt Will Transfer to Toilet: with supervision;ambulating Additional ADL Goal #1: Pt will demonstrate anticipatory awareness for safe completion of ADL/IADL.  Plan Discharge plan remains appropriate    Co-evaluation                 AM-PAC OT "6 Clicks" Daily Activity     Outcome Measure   Help from another person eating meals?: A Little Help from another person taking care of personal grooming?: A Little Help from another person toileting, which includes using toliet, bedpan, or urinal?: A Lot Help from another person bathing (including washing, rinsing, drying)?: A Little Help from another person to put on and taking off regular upper body clothing?: A Little Help from another person to put on and taking off regular lower body clothing?: A Lot 6 Click Score: 16    End of Session Equipment Utilized During Treatment: Gait belt;Rolling walker  OT Visit Diagnosis: Unsteadiness on feet (R26.81);History of falling (Z91.81);Muscle weakness (generalized) (M62.81)   Activity Tolerance Patient tolerated treatment well   Patient Left in chair;with call bell/phone within  reach;with chair alarm set   Nurse Communication Mobility status        Time: 1638-4536 OT Time Calculation (min): 28 min  Charges: OT General Charges $OT Visit: 1 Visit OT Treatments $Self Care/Home Management : 23-37 mins  Lodema Hong, [place] 03/30/2021, 1:02 PM

## 2021-03-31 DIAGNOSIS — E871 Hypo-osmolality and hyponatremia: Secondary | ICD-10-CM | POA: Diagnosis not present

## 2021-03-31 DIAGNOSIS — F10230 Alcohol dependence with withdrawal, uncomplicated: Secondary | ICD-10-CM | POA: Diagnosis not present

## 2021-03-31 DIAGNOSIS — I10 Essential (primary) hypertension: Secondary | ICD-10-CM | POA: Diagnosis not present

## 2021-03-31 DIAGNOSIS — R5381 Other malaise: Secondary | ICD-10-CM | POA: Diagnosis not present

## 2021-03-31 LAB — CREATININE, URINE, RANDOM: Creatinine, Urine: 58.38 mg/dL

## 2021-03-31 LAB — SODIUM, URINE, RANDOM: Sodium, Ur: 14 mmol/L

## 2021-03-31 LAB — BASIC METABOLIC PANEL
Anion gap: 9 (ref 5–15)
BUN: 12 mg/dL (ref 8–23)
CO2: 23 mmol/L (ref 22–32)
Calcium: 9.2 mg/dL (ref 8.9–10.3)
Chloride: 94 mmol/L — ABNORMAL LOW (ref 98–111)
Creatinine, Ser: 0.76 mg/dL (ref 0.44–1.00)
GFR, Estimated: >60 mL/min (ref >60)
Glucose, Bld: 102 mg/dL — ABNORMAL HIGH (ref 70–99)
Potassium: 3.9 mmol/L (ref 3.5–5.1)
Sodium: 126 mmol/L — ABNORMAL LOW (ref 135–145)

## 2021-03-31 LAB — OSMOLALITY, URINE: Osmolality, Ur: 243 mOsm/kg — ABNORMAL LOW (ref 300–900)

## 2021-03-31 LAB — OSMOLALITY: Osmolality: 263 mOsm/kg — ABNORMAL LOW (ref 275–295)

## 2021-03-31 NOTE — Progress Notes (Signed)
Physical Therapy Treatment Patient Details Name: [name] MRN: [id number] DOB: January 26, 1956 Today's Date: 03/31/2021   History of Present Illness Pt presented to ED on 9/12 with etoh abuse with withdrawals and frequent falls at home. Pt also with hyponatremia. Remains hospitalized due to unsafe d/c plan. 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    PT Comments    Pt able to increase gait but still needs min A for balance, below functional speed, and not at household distances.  She demonstrates good ROM with exercises but does not translate to ambulation (walks stiff).  Continues to have decreased safety awareness and unsafe to live alone.  Continue plan of care.     Recommendations for follow up therapy are one component of a multi-disciplinary discharge planning process, led by the attending physician.  Recommendations may be updated based on patient status, additional functional criteria and insurance authorization.  Follow Up Recommendations  SNF     Equipment Recommendations  Rolling walker with 5" wheels    Recommendations for Other Services       Precautions / Restrictions Precautions Precautions: Fall     Mobility  Bed Mobility               General bed mobility comments: in chair    Transfers Overall transfer level: Needs assistance Equipment used: Rolling walker (2 wheeled) Transfers: Sit to/from Stand Sit to Stand: Min guard         General transfer comment: vc for correct use of RW/hand placement  Ambulation/Gait Ambulation/Gait assistance: Min assist Gait Distance (Feet): 50 Feet (50'x2 with standing rest break) Assistive device: Rolling walker (2 wheeled) Gait Pattern/deviations: Step-through pattern;Wide base of support;Trunk flexed Gait velocity: decr   General Gait Details: Cues for keeping RW close; pt very stiff with ambulation and almost circumducting/pelvic rotation to advance legs rather than hip and knee  flexion.  Stopped and worked on standing marching with knee and hip flexion, but no carry over to gait even with cues.   Stairs             Wheelchair Mobility    Modified Rankin (Stroke Patients Only)       Balance Overall balance assessment: Needs assistance Sitting-balance support: No upper extremity supported;Feet supported Sitting balance-Leahy Scale: Good     Standing balance support: Bilateral upper extremity supported Standing balance-Leahy Scale: Poor Standing balance comment: requiring RW                            Cognition Arousal/Alertness: Awake/alert Behavior During Therapy: Flat affect Overall Cognitive Status: Impaired/Different from baseline Area of Impairment: Following commands;Problem solving;Memory;Awareness;Attention                   Current Attention Level: Selective Memory: Decreased short-term memory Following Commands: Follows one step commands with increased time Safety/Judgement: Decreased awareness of safety;Decreased awareness of deficits Awareness: Emergent Problem Solving: Slow processing;Difficulty sequencing;Requires verbal cues;Requires tactile cues        Exercises General Exercises - Lower Extremity Ankle Circles/Pumps: AROM;Both;10 reps;Supine Heel Slides: AROM;Both;10 reps;Supine Hip ABduction/ADduction: AROM;Both;10 reps;Supine Hip Flexion/Marching: AROM;Both;10 reps;Standing (RW and min A) Other Exercises Other Exercises: Also worked on reaching across midline to increase trunk rotation but pt reports hurts shoulders, so changed to just trunk rotation with faciliation to promote increased motion. 10x1 Bil Other Exercises: Cues for all exercises - good ROM    General Comments General comments (skin integrity, edema, etc.):  Pt asking about when MD is coming and when she can go home - discussed need for safe d/c plan, pt just states "I need to talk to the MD"      Pertinent Vitals/Pain Pain  Assessment: No/denies pain    Home Living                      Prior Function            PT Goals (current goals can now be found in the care plan section) Progress towards PT goals: Progressing toward goals    Frequency    Min 3X/week      PT Plan Current plan remains appropriate    Co-evaluation              AM-PAC PT "6 Clicks" Mobility   Outcome Measure  Help needed turning from your back to your side while in a flat bed without using bedrails?: A Little Help needed moving from lying on your back to sitting on the side of a flat bed without using bedrails?: A Little Help needed moving to and from a bed to a chair (including a wheelchair)?: A Little Help needed standing up from a chair using your arms (e.g., wheelchair or bedside chair)?: A Little Help needed to walk in hospital room?: A Little Help needed climbing 3-5 steps with a railing? : Total 6 Click Score: 16    End of Session Equipment Utilized During Treatment: Gait belt Activity Tolerance: Patient tolerated treatment well Patient left: in chair;with call bell/phone within reach;with chair alarm set Nurse Communication: Mobility status PT Visit Diagnosis: Unsteadiness on feet (R26.81);Repeated falls (R29.6);Ataxic gait (R26.0)     Time: 8889-1694 PT Time Calculation (min) (ACUTE ONLY): 23 min  Charges:  $Gait Training: 8-22 mins $Therapeutic Exercise: 8-22 mins                     Abran Richard, PT Acute Rehab Services Pager [phone] [facility] 03/31/2021, 1:48 PM

## 2021-03-31 NOTE — Consult Note (Addendum)
Reason for Consult: Hyponatremia Referring Physician: Eliseo Squires, DO  [name] is an 65 y.o. female with a PMH significant for HTN, depression, anxiety, and alcohol abuse who presented to [facility] ED for alcohol detox.  She has been drinking since she was 65 years old and prior to admission was drinking 5-6 beers a day.  She was turned down from outpatient rehab as she was already having early DT's with ataxia and difficulty standing.  Upon admission she was noted to have multiple electrolyte abnormalities such as hyponatremia, hypomagnesemia, hypokalemia.  She received IVF's and replacement of K and Mg, however serum sodium became worse with IVF's.  Dysnatremia workup on day 2 of admission (03/23/21) revealed Urine osm 362, UNa 122, Sosm 270 and serum sodium of 129.  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  Sodium levels have since fluctuated from 126-128 since and we were consulted to further evaluate and manage her hyponatremia.  She has a history of hyponatremia with several episodes of acute worsening.  Her baseline sodium appears to be variable at 125-131.  The trend in serum sodium is seen below.    Of note, she had been taking lisinopril prior to admission.  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  She has not been taking any SSRI's or other agents associated with drug induced hyponatremia.      Trend in Serum Sodium: Sodium  Date/Time Value Ref Range Status  03/31/2021 01:46 AM 126 (L) 135 - 145 mmol/L Final  03/30/2021 03:09 PM 127 (L) 135 - 145 mmol/L Final  03/30/2021 01:57 AM 128 (L) 135 - 145 mmol/L Final  03/29/2021 02:30 PM 128 (L) 135 - 145 mmol/L Final  03/29/2021 02:48 AM 127 (L) 135 - 145  mmol/L Final  03/28/2021 05:00 PM 128 (L) 135 - 145 mmol/L Final  03/28/2021 03:37 AM 122 (L) 135 - 145 mmol/L Final  03/27/2021 01:13 AM 129 (L) 135 - 145 mmol/L Final  03/26/2021 02:59 PM 126 (L) 135 - 145 mmol/L Final  03/26/2021 09:47 AM 125 (L) 135 - 145 mmol/L Final  03/26/2021 12:50 AM 127 (L) 135 - 145 mmol/L Final  03/25/2021 12:57 AM 127 (L) 135 - 145 mmol/L Final  03/24/2021 01:14 AM 130 (L) 135 - 145 mmol/L Final  03/23/2021 01:15 AM 129 (L) 135 - 145 mmol/L Final  03/21/2021 03:24 PM 130 (L) 135 - 145 mmol/L Final  03/18/2021 11:28 AM 131 (L) 135 - 145 mEq/L Final  02/21/2021 05:08 PM 120 (L) 135 - 145 mmol/L Final  09/15/2020 09:20 AM 127 (L) 135 - 145 mEq/L Final  06/28/2020 01:54 PM 124 (L) 135 - 145 mEq/L Final  03/22/2020 09:58 AM 130 (L) 135 - 146 mmol/L Final  03/04/2019 09:52 AM 125 (L) 135 - 145 mEq/L Final  04/30/2017 11:13 AM 129 (L) 135 - 145 mEq/L Final  11/12/2015 03:09 PM 132 (L) 135 - 146 mmol/L Final  10/09/2015 09:38 AM 127 (L) 135 - 145 mmol/L Final  10/07/2015 08:02 PM 122 (L) 135 - 145 mmol/L Final  10/07/2015 02:50 PM 116 (LL) 135 - 145 mmol/L Final  10/06/2015 04:08 PM 115 (LL) 135 - 145 mEq/L Final  09/21/2015 11:50 AM 134 (L) 135 - 145 mEq/L Final  06/21/2015 02:59 PM 130 (L) 135 - 145 mEq/L Final  06/18/2015 09:10 AM 122 (L) 135 - 145 mEq/L Final  06/15/2015 07:50 AM 128 (L) 135 - 145 mmol/L Final  06/14/2015 05:00 PM 129 (L) 135 - 145 mmol/L Final  06/13/2015 02:38 PM 129 (L) 135 - 145 mmol/L Final  06/13/2015 02:33 PM 127 (L) 135 - 145 mmol/L Final  09/01/2014 03:52 PM 132 (L) 135 - 145 mEq/L Final  06/30/2014 09:55 AM 128 (L) 135 - 145 mEq/L Final  05/29/2014 10:45 AM 126 (L) 135 - 145 mEq/L Final  02/27/2014 03:38 PM 130 (L) 135 - 145 mEq/L Final  03/28/2013 09:41 AM 130 (L) 135 - 145 mEq/L Final  09/13/2011 11:39 AM 131 (L) 135 - 145 mEq/L Final  11/09/2009 08:49 AM 132 (L) 135 - 145 meq/L Final  04/21/2009 09:58 AM 135 135 - 145 meq/L  Final  10/22/2007 12:00 AM 132 (L) 135 - 145 meq/L Final    PMH:   Past Medical History:  Diagnosis Date   Anxiety    Cancer ([place]) 04/2014   squamous cell carcinoma L inner thigh   Depression    Hard of hearing    Hypertension    Osteopenia    Substance abuse ([place])    TIA (transient ischemic attack) 2016    PSH:   Past Surgical History:  Procedure Laterality Date   AUGMENTATION MAMMAPLASTY Bilateral 1988   BREAST ENHANCEMENT SURGERY     BUNIONECTOMY     GUM SURGERY  11/2017   NOSE SURGERY     Epistaxis   TONSILLECTOMY      Allergies:  Allergies  Allergen Reactions   Codeine Other (See Comments)    REACTION: insomnia and anxious    Medications:   Prior to Admission medications   Medication Sig Start Date End Date Taking? Authorizing Provider  alendronate (FOSAMAX) 70 MG tablet Take with a full glass of water on an empty stomach. 12/27/20  Yes Roma Schanz R, DO  chlordiazePOXIDE (LIBRIUM) 25 MG capsule 50mg  PO TID x 1D, then 25-50mg  PO BID X 1D, then 25-50mg  PO QD X 1D 03/22/21  Yes Horton, Barbette Hair, MD  lisinopril (ZESTRIL) 40 MG tablet TAKE 1 TABLET BY MOUTH EVERY DAY 02/23/21  Yes Roma Schanz R, DO  metoprolol succinate (TOPROL-XL) 100 MG 24 hr tablet Take 1 tablet (100 mg total) by mouth daily. Take with or immediately following a meal. 12/27/20  Yes Lowne Chase, Yvonne R, DO  mirabegron ER (MYRBETRIQ) 50 MG TB24 tablet Take 1 tablet (50 mg total) by mouth daily. Patient not taking: No sig reported 06/28/20   Carollee Herter, Kendrick Fries R, DO  thiamine 250 MG tablet Take 1 tablet (250 mg total) by mouth daily. 03/22/21   Horton, Barbette Hair, MD    Discontinued Meds:   Medications Discontinued During This Encounter  Medication Reason   thiamine 250 MG tablet    thiamine (B-1) injection 100 mg    thiamine tablet 100 mg    thiamine (B-1) injection 100 mg    thiamine (B-1) injection 500 mg    thiamine tablet 500 mg    thiamine (B-1) injection 500 mg     amoxicillin (AMOXIL) 500 MG tablet Discontinued by provider   chlorhexidine (PERIDEX) 0.12 % solution Discontinued by provider   Coenzyme Q10 (COQ10) 100 MG CAPS Discontinued by provider   Cyanocobalamin (VITAMIN B-12 PO) Discontinued by provider  Multiple Vitamin (MULTIVITAMIN) capsule Discontinued by provider   LORazepam (ATIVAN) injection 0-4 mg    LORazepam (ATIVAN) tablet 0-4 mg    lactated ringers infusion    tolvaptan (SAMSCA) tablet 15 mg    chlordiazePOXIDE (LIBRIUM) capsule 10 mg    hydrocortisone cream 0.5 %    lisinopril (ZESTRIL) tablet 40 mg    LORazepam (ATIVAN) tablet 1-4 mg    LORazepam (ATIVAN) injection 1-4 mg    chlordiazePOXIDE (LIBRIUM) capsule 5 mg    amLODipine (NORVASC) tablet 10 mg    metoprolol succinate (TOPROL-XL) 24 hr tablet 100 mg    thiamine 500mg  in normal saline (1ml) IVPB    thiamine tablet 500 mg     Social History:  reports that she has never smoked. She has never used smokeless tobacco. She reports current alcohol use of about 4.0 - 6.0 standard drinks per week. She reports that she does not use drugs.  Family History:   Family History  Problem Relation Age of Onset   Hypertension Mother    Kidney disease Mother        dialysis   Alzheimer's disease Mother    Hyperlipidemia Father    Stroke Paternal Grandmother    Rheum arthritis Paternal Grandmother    Breast cancer Neg Hx    Colon cancer Neg Hx    Esophageal cancer Neg Hx    Rectal cancer Neg Hx    Stomach cancer Neg Hx     Pertinent items are noted in HPI.  Blood pressure 117/88, pulse 62, temperature 98.1 F (36.7 C), temperature source Oral, resp. rate 17, height 5' (1.524 m), weight 50 kg, SpO2 100 %. General appearance: alert, cooperative, cachectic, and no distress Head: Normocephalic, without obvious abnormality, atraumatic Resp: clear to auscultation bilaterally Cardio: regular rate and rhythm, S1, S2 normal, no murmur, click, rub or gallop GI: soft, non-tender; bowel  sounds normal; no masses,  no organomegaly Extremities: extremities normal, atraumatic, no cyanosis or edema  Labs: Basic Metabolic Panel: Recent Labs  Lab 03/25/21 0057 03/26/21 0050 03/26/21 0947 03/26/21 1459 03/27/21 0113 03/28/21 0337 03/28/21 1700 03/29/21 0248 03/29/21 1430 03/30/21 0157 03/30/21 1509 03/31/21 0146  NA 127* 127* 125* 126* 129* 122* 128* 127* 128* 128* 127* 126*  K 3.7 3.7 3.1* 4.3 4.1 4.0  --  3.9  --  3.8 3.7 3.9  CL 93* 91* 92* 93* 95* 88*  --  90*  --  92* 91* 94*  CO2 26 25 20* 25 23 23   --  24  --  24 25 23   GLUCOSE 99 113* 204* 88 95 97  --  98  --  101* 94 102*  BUN <5* 7* 10 10 10  37*  --  17  --  12 13 12   CREATININE 0.66 0.83 0.90 0.79 0.83 0.70  --  0.74  --  0.70 0.78 0.76  ALBUMIN 3.4* 3.4* 3.3*  --  3.4*  --   --   --   --   --   --   --   CALCIUM 9.2 9.2 9.2 9.4 9.5 9.6  --  9.8  --  9.7 9.4 9.2   Liver Function Tests: Recent Labs  Lab 03/26/21 0050 03/26/21 0947 03/27/21 0113  AST 34 37 33  ALT 28 28 30   ALKPHOS 62 56 55  BILITOT 0.9 0.6 0.9  PROT 6.2* 6.2* 5.9*  ALBUMIN 3.4* 3.3* 3.4*   No results for input(s): LIPASE, AMYLASE in the last 168 hours.  No results for input(s): AMMONIA in the last 168 hours. CBC: Recent Labs  Lab 03/25/21 0057 03/26/21 0050 03/27/21 0113  WBC 5.8 7.3 6.4  NEUTROABS 3.3 4.8 4.5  HGB 12.7 12.9 12.9  HCT 35.2* 35.1* 35.9*  MCV 104.5* 102.6* 104.4*  PLT 268 283 263   PT/INR: @labrcntip (inr:5) Cardiac Enzymes: No results for input(s): CKTOTAL, CKMB, CKMBINDEX, TROPONINI in the last 168 hours. CBG: No results for input(s): GLUCAP in the last 168 hours.  Iron Studies: No results for input(s): IRON, TIBC, TRANSFERRIN, FERRITIN in the last 168 hours.  Xrays/Other Studies: No results found.   Assessment/Plan:  Hyponatremia - chronic issue.  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   Will repeat studies today and if continues to support SIADH will need an occult malignancy workup.  She is currently asymptomatic.  Recommend fluid restriction of 800 ml/day for now and consider Ure-NA 15-30 grams daily if sodium continues to fall.  Would avoid tolvaptan in the future given significant history of alcohol intake and likely underlying liver disease.  Hypokalemia - improved with replacement Hypomagnesemia - improved with replacement.  Continue to follow levels intermittently Alcohol abuse - with early DT's.  Counseled and placed on librium and CIWA protocol and since resolved.  Will need ongoing rehab. Multiple falls and ataxia - MRI of brain negative for acute findings.  Will need PT/OT at SNF due to debilitation.    Governor Rooks Wilber Fini 03/31/2021, 2:09 PM

## 2021-03-31 NOTE — TOC Progression Note (Signed)
Transition of Care [facility]) - Progression Note    Patient Details  Name: [name] MRN: [id number] Date of Birth: 1956/06/27  Transition of Care [facility]) CM/SW [place], [place] Phone Number: 03/31/2021, 5:14 PM  Clinical Narrative:    [name] still waiting to hear from [facility].    Expected Discharge Plan: [place] Barriers to Discharge: Continued Medical Work up  Expected Discharge Plan and Services Expected Discharge Plan: [place] In-house Referral: Clinical Social Work Discharge Planning Services: CM Consult   Living arrangements for the past 2 months: Apartment                                       Social Determinants of Health (SDOH) Interventions    Readmission Risk Interventions No flowsheet data found.

## 2021-03-31 NOTE — Progress Notes (Signed)
Progress Note    [name]  QPY:[id number] DOB: [birth year]  DOA: 03/21/2021 PCP: Ann Held, DO    Brief Narrative:     Medical records reviewed and are as summarized below:  [name] is an 65 y.o. female with medical history significant of hypertension, alcohol abuse, anxiety, and depression presents with what she reports is an alcohol problem.  Admits to drinking 4-5 beers per day on average and intermittently drinking wine for over 30 years.  Her last drink was 2 days ago.  Her friend Daine Floras was able to provide additional history over the phone. Her friend tried to take her to [facility] in [place] for alcohol rehab, but she was declined admission as she was significantly debilitated needing assistance, in the ER she was found to be in early DTs, had ataxia and difficulty in standing up and ambulating.  She was admitted for further care.  Assessment/Plan:   Principal Problem:   Alcohol withdrawal ([place]) Active Problems:   Essential hypertension   Hyponatremia   Anxiety and depression   Gait disturbance   Falls frequently   Alcohol abuse   Alcohol abuse with early DTs.- Counseled to quit alcohol, was placed on Librium and CIWA protocol, this problem has resolved.   Multiple falls with some ataxia.  MRI brain nonacute however limited due to motion artifact, continue PT OT, continue replacement of thiamine and B12, B12 levels were borderline, thiamine levels were added to blood work however she has received some supplemental thiamine by that time, thiamine levels ok  Could have early Wernicke's encephalopathy   She is extremely weak, lives alone and will require SNF, continue PT OT, gradual improvement in her balance and strength.    Hypomagnesemia, Hypokalemia .   -replaced K &  Mag IV  SIADH -clinically euvolemic , U.Na> 70, U Osm >> S Osm, SIADH, post single dose Samsca on 03/26/21 with 125>>129, dropped again  03/28/21 when Samsca was held, Na improving again after Samsca dose on 03/28/2021, repeat Samsca again on 03/29/2021. -fluid restrict -nephrology consult   Hypertension.  Blood pressure medications adjusted as BP lower end of normal   possible underlying alcoholic cirrhosis.  Ultrasound noted.  stable INR and LFTs.  Outpatient GI follow-up   Family Communication/Anticipated D/C date and plan/Code Status   DVT prophylaxis: Lovenox ordered. Code Status: Full Code.  Disposition Plan: Status is: Inpatient  Remains inpatient appropriate because:Inpatient level of care appropriate due to severity of illness  Dispo: The patient is from: Home              Anticipated d/c is to: SNF              Patient currently is medically stable to d/c.   Difficult to place patient No         Medical Consultants:   Nephrology     Subjective:   No complaints  Objective:    Vitals:   03/30/21 2354 03/31/21 0418 03/31/21 0808 03/31/21 1224  BP: (!) 153/86 118/88 137/89 117/88  Pulse: 99 86 90 62  Resp: 18  18 17   Temp: 98.4 F (36.9 C) 98.5 F (36.9 C) 98.5 F (36.9 C) 98.1 F (36.7 C)  TempSrc: Oral Oral Oral Oral  SpO2: 100% 100% 100% 100%  Weight:      Height:        Intake/Output Summary (Last 24 hours) at 03/31/2021 1343 Last data filed at 03/31/2021 [phone]  Gross per 24 hour  Intake 477 ml  Output 775 ml  Net -298 ml   Filed Weights   03/22/21 2344 03/27/21 0315 03/28/21 0400  Weight: 47.5 kg 47.7 kg 50 kg    Exam:   General: Appearance:    Older than stated age female in no acute distress     Lungs:     respirations unlabored  Heart:    Normal heart rate. Normal rhythm. No murmurs, rubs, or gallops.    MS:   All extremities are intact.    Neurologic:   Awake, alert, oriented x 3     Data Reviewed:   I have personally reviewed following labs and imaging studies:  Labs: Labs show the following:   Basic Metabolic Panel: Recent Labs  Lab 03/25/21 0057  03/26/21 0050 03/26/21 0947 03/27/21 0113 03/28/21 0337 03/28/21 1700 03/29/21 0248 03/29/21 1430 03/30/21 0157 03/30/21 1509 03/31/21 0146  NA 127* 127*   < > 129* 122*   < > 127* 128* 128* 127* 126*  K 3.7 3.7   < > 4.1 4.0  --  3.9  --  3.8 3.7 3.9  CL 93* 91*   < > 95* 88*  --  90*  --  92* 91* 94*  CO2 26 25   < > 23 23  --  24  --  24 25 23   GLUCOSE 99 113*   < > 95 97  --  98  --  101* 94 102*  BUN <5* 7*   < > 10 37*  --  17  --  12 13 12   CREATININE 0.66 0.83   < > 0.83 0.70  --  0.74  --  0.70 0.78 0.76  CALCIUM 9.2 9.2   < > 9.5 9.6  --  9.8  --  9.7 9.4 9.2  MG 1.6* 1.8  --  1.8  --   --   --   --   --   --   --    < > = values in this interval not displayed.   GFR Estimated Creatinine Clearance: 51 mL/min (by C-G formula based on SCr of 0.76 mg/dL). Liver Function Tests: Recent Labs  Lab 03/25/21 0057 03/26/21 0050 03/26/21 0947 03/27/21 0113  AST 38 34 37 33  ALT 27 28 28 30   ALKPHOS 55 62 56 55  BILITOT 0.8 0.9 0.6 0.9  PROT 5.9* 6.2* 6.2* 5.9*  ALBUMIN 3.4* 3.4* 3.3* 3.4*   No results for input(s): LIPASE, AMYLASE in the last 168 hours. No results for input(s): AMMONIA in the last 168 hours. Coagulation profile No results for input(s): INR, PROTIME in the last 168 hours.  CBC: Recent Labs  Lab 03/25/21 0057 03/26/21 0050 03/27/21 0113  WBC 5.8 7.3 6.4  NEUTROABS 3.3 4.8 4.5  HGB 12.7 12.9 12.9  HCT 35.2* 35.1* 35.9*  MCV 104.5* 102.6* 104.4*  PLT 268 283 263   Cardiac Enzymes: No results for input(s): CKTOTAL, CKMB, CKMBINDEX, TROPONINI in the last 168 hours. BNP (last 3 results) No results for input(s): PROBNP in the last 8760 hours. CBG: No results for input(s): GLUCAP in the last 168 hours. D-Dimer: No results for input(s): DDIMER in the last 72 hours. Hgb A1c: No results for input(s): HGBA1C in the last 72 hours. Lipid Profile: No results for input(s): CHOL, HDL, LDLCALC, TRIG, CHOLHDL, LDLDIRECT in the last 72 hours. Thyroid  function studies: No results for input(s): TSH, T4TOTAL, T3FREE, THYROIDAB in the last  72 hours.  Invalid input(s): FREET3 Anemia work up: No results for input(s): VITAMINB12, FOLATE, FERRITIN, TIBC, IRON, RETICCTPCT in the last 72 hours. Sepsis Labs: Recent Labs  Lab 03/25/21 0057 03/26/21 0050 03/27/21 0113  WBC 5.8 7.3 6.4    Microbiology Recent Results (from the past 240 hour(s))  SARS CORONAVIRUS 2 (TAT 6-24 HRS) Nasopharyngeal Nasopharyngeal Swab     Status: None   Collection Time: 03/25/21  4:00 AM   Specimen: Nasopharyngeal Swab  Result Value Ref Range Status   SARS Coronavirus 2 NEGATIVE NEGATIVE Final    Comment: (NOTE) SARS-CoV-2 target nucleic acids are NOT DETECTED.  The SARS-CoV-2 RNA is generally detectable in upper and lower respiratory specimens during the acute phase of infection. Negative results do not preclude SARS-CoV-2 infection, do not rule out co-infections with other pathogens, and should not be used as the sole basis for treatment or other patient management decisions. Negative results must be combined with clinical observations, patient history, and epidemiological information. The expected result is Negative.  Fact Sheet for Patients: SugarRoll.be  Fact Sheet for Healthcare Providers: https://www.woods-mathews.com/  This test is not yet approved or cleared by the [place] FDA and  has been authorized for detection and/or diagnosis of SARS-CoV-2 by FDA under an Emergency Use Authorization (EUA). This EUA will remain  in effect (meaning this test can be used) for the duration of the COVID-19 declaration under Se ction 564(b)(1) of the Act, 21 U.S.C. section 360bbb-3(b)(1), unless the authorization is terminated or revoked sooner.  Performed at [facility], [place] [address] [place], [place] [address]     Procedures and diagnostic studies:  No results found.  Medications:     enoxaparin (LOVENOX) injection  40 mg Subcutaneous N36R   folic acid  1 mg Oral Daily   metoprolol succinate  50 mg Oral Daily   multivitamin with minerals  1 tablet Oral Daily   pantoprazole  40 mg Oral Daily   sodium chloride flush  3 mL Intravenous Q12H   vitamin B-12  1,000 mcg Oral BID   Continuous Infusions:     LOS: 9 days   Geradine Girt  [facility]   How to contact the [facility] Attending or Consulting provider [place] or covering provider during after hours [place], for this patient?  Check the care team in [facility] and look for a) attending/consulting TRH provider listed and b) the [facility] team listed Log into [email or web address] and use [place]'s universal password to access. If you do not have the password, please contact the hospital operator. Locate the [facility] provider you are looking for under [facility] and page to a number that you can be directly reached. If you still have difficulty reaching the provider, please page the [facility] (Director on Call) for the Hospitalists listed on amion for assistance.  03/31/2021, 1:43 PM

## 2021-04-01 DIAGNOSIS — I10 Essential (primary) hypertension: Secondary | ICD-10-CM | POA: Diagnosis not present

## 2021-04-01 DIAGNOSIS — F32A Depression, unspecified: Secondary | ICD-10-CM | POA: Diagnosis not present

## 2021-04-01 DIAGNOSIS — F10230 Alcohol dependence with withdrawal, uncomplicated: Secondary | ICD-10-CM | POA: Diagnosis not present

## 2021-04-01 DIAGNOSIS — F419 Anxiety disorder, unspecified: Secondary | ICD-10-CM | POA: Diagnosis not present

## 2021-04-01 LAB — BASIC METABOLIC PANEL
Anion gap: 10 (ref 5–15)
BUN: 10 mg/dL (ref 8–23)
CO2: 22 mmol/L (ref 22–32)
Calcium: 9.1 mg/dL (ref 8.9–10.3)
Chloride: 94 mmol/L — ABNORMAL LOW (ref 98–111)
Creatinine, Ser: 0.69 mg/dL (ref 0.44–1.00)
GFR, Estimated: >60 mL/min (ref >60)
Glucose, Bld: 91 mg/dL (ref 70–99)
Potassium: 3.8 mmol/L (ref 3.5–5.1)
Sodium: 126 mmol/L — ABNORMAL LOW (ref 135–145)

## 2021-04-01 LAB — CORTISOL: Cortisol, Plasma: 20.9 ug/dL

## 2021-04-01 MED ORDER — SODIUM CHLORIDE 1 G PO TABS
2.0000 g | ORAL_TABLET | Freq: Three times a day (TID) | ORAL | Status: DC
  Filled 2021-04-01: qty 2

## 2021-04-01 MED ORDER — SODIUM CHLORIDE 1 G PO TABS
2.0000 g | ORAL_TABLET | Freq: Three times a day (TID) | ORAL | Status: DC
  Administered 2021-04-01 – 2021-04-08 (×23): 2 g via ORAL
  Filled 2021-04-01 (×25): qty 2

## 2021-04-01 MED ORDER — SODIUM CHLORIDE 1 G PO TABS
2.0000 g | ORAL_TABLET | Freq: Three times a day (TID) | ORAL | Status: DC
  Filled 2021-04-01 (×2): qty 2

## 2021-04-01 NOTE — Progress Notes (Addendum)
[facility] ROUNDING NOTE   Subjective:   Interval History: This is a 6-year lady with a history of depression anxiety alcohol abuse and hypertension.  She presented for detox.  On admission she was found to be profoundly hyponatremic.  Urine osmolality 362 urine sodium 122 serum osmolality 277 sodium 129.  She received a one-time dose of tolvaptan 03/26/2021 with an increase in his sodium to 129.  Then dropped to 122.  Sodium   levels have then fluctuated.   She appears to be euvolemic.  Blood pressure 124/93 pulse 90 temperature 97.7 O2 sats 100%  Sodium 126 potassium 3.8 chloride 94 CO2 22 BUN 10 creatinine 0.7 glucose 91 cortisol 20.9 urine sodium 14 urine osmolality 243 serum osmolality 263.   Objective:  Vital signs in last 24 hours:  Temp:  [97.7 F (36.5 C)-98.9 F (37.2 C)] 97.7 F (36.5 C) (09/23 0407) Pulse Rate:  [62-90] 90 (09/23 0407) Resp:  [16-18] 18 (09/23 0407) BP: (117-156)/(79-102) 124/93 (09/23 0407) SpO2:  [100 %] 100 % (09/23 0407)  Weight change:  Filed Weights   03/22/21 2344 03/27/21 0315 03/28/21 0400  Weight: 47.5 kg 47.7 kg 50 kg    Intake/Output: I/O last 3 completed shifts: In: 419 [P.O.:717] Out: 1175 [Urine:1175]   Intake/Output this shift:  No intake/output data recorded.  General appearance: alert, cooperative, cachectic, and no distress Head: Normocephalic, without obvious abnormality, atraumatic Resp: clear to auscultation bilaterally Cardio: regular rate and rhythm, S1, S2 normal, no murmur, click, rub or gallop GI: soft, non-tender; bowel sounds normal; no masses,  no organomegaly Extremities: extremities normal, atraumatic, no cyanosis or edema   Basic Metabolic Panel: Recent Labs  Lab 03/26/21 0050 03/26/21 0947 03/27/21 0113 03/28/21 0337 03/29/21 0248 03/29/21 1430 03/30/21 0157 03/30/21 1509 03/31/21 0146 04/01/21 0103  NA 127*   < > 129*   < > 127* 128* 128* 127* 126* 126*  K 3.7   < > 4.1   < > 3.9   --  3.8 3.7 3.9 3.8  CL 91*   < > 95*   < > 90*  --  92* 91* 94* 94*  CO2 25   < > 23   < > 24  --  24 25 23 22   GLUCOSE 113*   < > 95   < > 98  --  101* 94 102* 91  BUN 7*   < > 10   < > 17  --  12 13 12 10   CREATININE 0.83   < > 0.83   < > 0.74  --  0.70 0.78 0.76 0.69  CALCIUM 9.2   < > 9.5   < > 9.8  --  9.7 9.4 9.2 9.1  MG 1.8  --  1.8  --   --   --   --   --   --   --    < > = values in this interval not displayed.    Liver Function Tests: Recent Labs  Lab 03/26/21 0050 03/26/21 0947 03/27/21 0113  AST 34 37 33  ALT 28 28 30   ALKPHOS 62 56 55  BILITOT 0.9 0.6 0.9  PROT 6.2* 6.2* 5.9*  ALBUMIN 3.4* 3.3* 3.4*   No results for input(s): LIPASE, AMYLASE in the last 168 hours. No results for input(s): AMMONIA in the last 168 hours.  CBC: Recent Labs  Lab 03/26/21 0050 03/27/21 0113  WBC 7.3 6.4  NEUTROABS 4.8 4.5  HGB 12.9 12.9  HCT  35.1* 35.9*  MCV 102.6* 104.4*  PLT 283 263    Cardiac Enzymes: No results for input(s): CKTOTAL, CKMB, CKMBINDEX, TROPONINI in the last 168 hours.  BNP: Invalid input(s): POCBNP  CBG: No results for input(s): GLUCAP in the last 168 hours.  Microbiology: Results for orders placed or performed during the hospital encounter of 03/21/21  SARS CORONAVIRUS 2 (TAT 6-24 HRS) Nasopharyngeal Nasopharyngeal Swab     Status: None   Collection Time: 03/25/21  4:00 AM   Specimen: Nasopharyngeal Swab  Result Value Ref Range Status   SARS Coronavirus 2 NEGATIVE NEGATIVE Final    Comment: (NOTE) SARS-CoV-2 target nucleic acids are NOT DETECTED.  The SARS-CoV-2 RNA is generally detectable in upper and lower respiratory specimens during the acute phase of infection. Negative results do not preclude SARS-CoV-2 infection, do not rule out co-infections with other pathogens, and should not be used as the sole basis for treatment or other patient management decisions. Negative results must be combined with clinical observations, patient history,  and epidemiological information. The expected result is Negative.  Fact Sheet for Patients: SugarRoll.be  Fact Sheet for Healthcare Providers: https://www.woods-mathews.com/  This test is not yet approved or cleared by the [place] FDA and  has been authorized for detection and/or diagnosis of SARS-CoV-2 by FDA under an Emergency Use Authorization (EUA). This EUA will remain  in effect (meaning this test can be used) for the duration of the COVID-19 declaration under Se ction 564(b)(1) of the Act, 21 U.S.C. section 360bbb-3(b)(1), unless the authorization is terminated or revoked sooner.  Performed at [facility], [place] [address], [place], [place] [address]     Coagulation Studies: No results for input(s): LABPROT, INR in the last 72 hours.  Urinalysis: No results for input(s): COLORURINE, LABSPEC, PHURINE, GLUCOSEU, HGBUR, BILIRUBINUR, KETONESUR, PROTEINUR, UROBILINOGEN, NITRITE, LEUKOCYTESUR in the last 72 hours.  Invalid input(s): APPERANCEUR    Imaging: No results found.   Medications:     enoxaparin (LOVENOX) injection  40 mg Subcutaneous J09T   folic acid  1 mg Oral Daily   metoprolol succinate  50 mg Oral Daily   multivitamin with minerals  1 tablet Oral Daily   pantoprazole  40 mg Oral Daily   sodium chloride flush  3 mL Intravenous Q12H   vitamin B-12  1,000 mcg Oral BID   acetaminophen **OR** acetaminophen, albuterol, hydrALAZINE, melatonin  Assessment/ Plan:  Hyponatremia.  She appears to have a low urine osmolality as well as urine sodium that is low.  This to be more consistent with poor solute intake.  I would be consistent with a history.  I would recommend increasing solute intake and salt tabs.  We can repeat serum sodium if situation continues to become problematic.  I would involve social workers and arrange Tourist information centre manager.  She absolutely has to stop drinking alcohol. Will sign off patient today.   Please call back if needed    LOS: [place] @TODAY @7 :45 AM

## 2021-04-01 NOTE — Progress Notes (Signed)
Progress Note    [name]  IOE:[id number] DOB: [birth year]  DOA: 03/21/2021 PCP: Ann Held, DO    Brief Narrative:     Medical records reviewed and are as summarized below:  [name] is an 65 y.o. female with medical history significant of hypertension, alcohol abuse, anxiety, and depression presents with what she reports is an alcohol problem.  Admits to drinking 4-5 beers per day on average and intermittently drinking wine for over 30 years.  Her last drink was 2 days ago.  Her friend Daine Floras was able to provide additional history over the phone. Her friend tried to take her to [facility] in [place] for alcohol rehab, but she was declined admission as she was significantly debilitated needing assistance, in the ER she was found to be in early DTs, had ataxia and difficulty in standing up and ambulating.  She was admitted for further care.  Assessment/Plan:   Principal Problem:   Alcohol withdrawal ([place]) Active Problems:   Essential hypertension   Hyponatremia   Anxiety and depression   Gait disturbance   Falls frequently   Alcohol abuse   Alcohol abuse with early DTs.- Counseled to quit alcohol, was placed on Librium and CIWA protocol, this problem has resolved.   Multiple falls with some ataxia.  MRI brain nonacute however limited due to motion artifact, continue PT OT, continue replacement of thiamine and B12, B12 levels were borderline, thiamine levels were added to blood work however she has received some supplemental thiamine by that time, thiamine levels ok  Could have early Wernicke's encephalopathy   She is extremely weak, lives alone and will require SNF, continue PT OT, gradual improvement in her balance and strength.    Hypomagnesemia, Hypokalemia .   -replaced K &  Mag   SIADH -clinically euvolemic , U.Na> 70, U Osm >> S Osm, SIADH, post single dose Samsca on 03/26/21 with 125>>129, dropped again  03/28/21 when Samsca was held, Na improving again after Samsca dose on 03/28/2021, repeat Samsca again on 03/29/2021. -fluid restrict -nephrology consult appreciated: She appears to have a low urine osmolality as well as urine sodium that is low.  This to be more consistent with poor solute intake.  I would be consistent with a history.  I would recommend increasing solute intake and salt tabs.   Hypertension.  Blood pressure medications adjusted as BP lower end of normal   possible underlying alcoholic cirrhosis.  Ultrasound noted.  stable INR and LFTs.  Outpatient GI follow-up   Family Communication/Anticipated D/C date and plan/Code Status   DVT prophylaxis: Lovenox ordered. Code Status: Full Code.  Disposition Plan: Status is: Inpatient  Remains inpatient appropriate because:Inpatient level of care appropriate due to severity of illness  Dispo: The patient is from: Home              Anticipated d/c is to: SNF              Patient currently is medically stable to d/c.   Difficult to place patient No         Medical Consultants:   Nephrology     Subjective:   No complaints  Objective:    Vitals:   04/01/21 0158 04/01/21 0407 04/01/21 0751 04/01/21 1142  BP: (!) 134/91 (!) 124/93 (!) 144/83 (!) 138/99  Pulse: 74 90 82 77  Resp: 18 18 18 18   Temp: 98.5 F (36.9 C) 97.7 F (36.5 C) 99.5  F (37.5 C) 98.7 F (37.1 C)  TempSrc: Oral Oral Oral Oral  SpO2: 100% 100% 100% 100%  Weight:      Height:        Intake/Output Summary (Last 24 hours) at 04/01/2021 1303 Last data filed at 04/01/2021 0413 Gross per 24 hour  Intake 240 ml  Output 400 ml  Net -160 ml   Filed Weights   03/22/21 2344 03/27/21 0315 03/28/21 0400  Weight: 47.5 kg 47.7 kg 50 kg    Exam:   General: Appearance:    elderlyfemale in no acute distress     Lungs:     respirations unlabored  Heart:    Normal heart rate. Normal rhythm. No murmurs, rubs, or gallops.    MS:   All extremities  are intact.    Neurologic:   Awake, alert, oriented x 3. No apparent focal neurological           defect.      Data Reviewed:   I have personally reviewed following labs and imaging studies:  Labs: Labs show the following:   Basic Metabolic Panel: Recent Labs  Lab 03/26/21 0050 03/26/21 0947 03/27/21 0113 03/28/21 0337 03/29/21 0248 03/29/21 1430 03/30/21 0157 03/30/21 1509 03/31/21 0146 04/01/21 0103  NA 127*   < > 129*   < > 127* 128* 128* 127* 126* 126*  K 3.7   < > 4.1   < > 3.9  --  3.8 3.7 3.9 3.8  CL 91*   < > 95*   < > 90*  --  92* 91* 94* 94*  CO2 25   < > 23   < > 24  --  24 25 23 22   GLUCOSE 113*   < > 95   < > 98  --  101* 94 102* 91  BUN 7*   < > 10   < > 17  --  12 13 12 10   CREATININE 0.83   < > 0.83   < > 0.74  --  0.70 0.78 0.76 0.69  CALCIUM 9.2   < > 9.5   < > 9.8  --  9.7 9.4 9.2 9.1  MG 1.8  --  1.8  --   --   --   --   --   --   --    < > = values in this interval not displayed.   GFR Estimated Creatinine Clearance: 51 mL/min (by C-G formula based on SCr of 0.69 mg/dL). Liver Function Tests: Recent Labs  Lab 03/26/21 0050 03/26/21 0947 03/27/21 0113  AST 34 37 33  ALT 28 28 30   ALKPHOS 62 56 55  BILITOT 0.9 0.6 0.9  PROT 6.2* 6.2* 5.9*  ALBUMIN 3.4* 3.3* 3.4*   No results for input(s): LIPASE, AMYLASE in the last 168 hours. No results for input(s): AMMONIA in the last 168 hours. Coagulation profile No results for input(s): INR, PROTIME in the last 168 hours.  CBC: Recent Labs  Lab 03/26/21 0050 03/27/21 0113  WBC 7.3 6.4  NEUTROABS 4.8 4.5  HGB 12.9 12.9  HCT 35.1* 35.9*  MCV 102.6* 104.4*  PLT 283 263   Cardiac Enzymes: No results for input(s): CKTOTAL, CKMB, CKMBINDEX, TROPONINI in the last 168 hours. BNP (last 3 results) No results for input(s): PROBNP in the last 8760 hours. CBG: No results for input(s): GLUCAP in the last 168 hours. D-Dimer: No results for input(s): DDIMER in the last 72 hours. Hgb A1c: No results  for input(s): HGBA1C in the last 72 hours. Lipid Profile: No results for input(s): CHOL, HDL, LDLCALC, TRIG, CHOLHDL, LDLDIRECT in the last 72 hours. Thyroid function studies: No results for input(s): TSH, T4TOTAL, T3FREE, THYROIDAB in the last 72 hours.  Invalid input(s): FREET3 Anemia work up: No results for input(s): VITAMINB12, FOLATE, FERRITIN, TIBC, IRON, RETICCTPCT in the last 72 hours. Sepsis Labs: Recent Labs  Lab 03/26/21 0050 03/27/21 0113  WBC 7.3 6.4    Microbiology Recent Results (from the past 240 hour(s))  SARS CORONAVIRUS 2 (TAT 6-24 HRS) Nasopharyngeal Nasopharyngeal Swab     Status: None   Collection Time: 03/25/21  4:00 AM   Specimen: Nasopharyngeal Swab  Result Value Ref Range Status   SARS Coronavirus 2 NEGATIVE NEGATIVE Final    Comment: (NOTE) SARS-CoV-2 target nucleic acids are NOT DETECTED.  The SARS-CoV-2 RNA is generally detectable in upper and lower respiratory specimens during the acute phase of infection. Negative results do not preclude SARS-CoV-2 infection, do not rule out co-infections with other pathogens, and should not be used as the sole basis for treatment or other patient management decisions. Negative results must be combined with clinical observations, patient history, and epidemiological information. The expected result is Negative.  Fact Sheet for Patients: SugarRoll.be  Fact Sheet for Healthcare Providers: https://www.woods-mathews.com/  This test is not yet approved or cleared by the [place] FDA and  has been authorized for detection and/or diagnosis of SARS-CoV-2 by FDA under an Emergency Use Authorization (EUA). This EUA will remain  in effect (meaning this test can be used) for the duration of the COVID-19 declaration under Se ction 564(b)(1) of the Act, 21 U.S.C. section 360bbb-3(b)(1), unless the authorization is terminated or revoked sooner.  Performed at [facility], [place] [address] [place], [place] [address]     Procedures and diagnostic studies:  No results found.  Medications:    enoxaparin (LOVENOX) injection  40 mg Subcutaneous I34V   folic acid  1 mg Oral Daily   metoprolol succinate  50 mg Oral Daily   multivitamin with minerals  1 tablet Oral Daily   pantoprazole  40 mg Oral Daily   sodium chloride flush  3 mL Intravenous Q12H   sodium chloride  2 g Oral TID WC   vitamin B-12  1,000 mcg Oral BID   Continuous Infusions:     LOS: 10 days   Geradine Girt  [facility]   How to contact the [facility] Attending or Consulting provider [place] or covering provider during after hours [place], for this patient?  Check the care team in [facility] and look for a) attending/consulting TRH provider listed and b) the [facility] team listed Log into [email or web address] and use [place]'s universal password to access. If you do not have the password, please contact the hospital operator. Locate the [facility] provider you are looking for under [facility] and page to a number that you can be directly reached. If you still have difficulty reaching the provider, please page the [facility] (Director on Call) for the Hospitalists listed on amion for assistance.  04/01/2021, 1:03 PM

## 2021-04-01 NOTE — Progress Notes (Signed)
Occupational Therapy Treatment Patient Details Name: [name] MRN: [id number] DOB: 05/16/1956 Today's Date: 04/01/2021   History of present illness Pt presented to ED on 9/12 with etoh abuse with withdrawals and frequent falls at home. Pt also with hyponatremia. Remains hospitalized due to unsafe d/c plan. 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   OT comments  Patient seen by skilled OT to address bed mobility, functional transfers, and self care. Patient requires frequent cues for safety, reaching back to sit, and correct walker use.  Patient was able to stand for most bathing and grooming tasks and washed legs seated.  Patient making good progress with OT treatment.  Acute OT to continue to follow.    Recommendations for follow up therapy are one component of a multi-disciplinary discharge planning process, led by the attending physician.  Recommendations may be updated based on patient status, additional functional criteria and insurance authorization.    Follow Up Recommendations  SNF;Supervision/Assistance - 24 hour    Equipment Recommendations  3 in 1 bedside commode    Recommendations for Other Services      Precautions / Restrictions Precautions Precautions: Fall       Mobility Bed Mobility Overal bed mobility: Needs Assistance Bed Mobility: Supine to Sit     Supine to sit: Min guard;HOB elevated     General bed mobility comments: uses rails for bed mobility    Transfers Overall transfer level: Needs assistance Equipment used: Rolling walker (2 wheeled) Transfers: Sit to/from Stand Sit to Stand: Min guard         General transfer comment: vc for correct use of RW/hand placement    Balance Overall balance assessment: Needs assistance Sitting-balance support: No upper extremity supported;Feet supported Sitting balance-Leahy Scale: Good     Standing balance support: Single extremity supported;During functional activity Standing  balance-Leahy Scale: Poor Standing balance comment: requiring RW                           ADL either performed or assessed with clinical judgement   ADL Overall ADL's : Needs assistance/impaired     Grooming: Wash/dry hands;Wash/dry face;Brushing hair;Standing;Min guard Grooming Details (indicate cue type and reason): min guard for balance and safety Upper Body Bathing: Min guard;Standing   Lower Body Bathing: Minimal assistance;Sit to/from stand Lower Body Bathing Details (indicate cue type and reason): bathed periarea while standing and legs seated Upper Body Dressing : Set up;Sitting Upper Body Dressing Details (indicate cue type and reason): donn gown     Toilet Transfer: Min Forensic psychologist Details (indicate cue type and reason): cues for safety with transfer Toileting- Clothing Manipulation and Hygiene: Minimal assistance;Sit to/from stand Toileting - Clothing Manipulation Details (indicate cue type and reason): assistance for thoroughness with toilet hygiene     Functional mobility during ADLs: Min guard;Minimal assistance;Rolling walker General ADL Comments: walks with RW too far infront of her and requires cues to correct     Vision       Perception     Praxis      Cognition Arousal/Alertness: Awake/alert Behavior During Therapy: Flat affect Overall Cognitive Status: Impaired/Different from baseline Area of Impairment: Following commands;Problem solving;Memory;Awareness;Attention                 Orientation Level: Person;Place;Time Current Attention Level: Selective Memory: Decreased short-term memory Following Commands: Follows one step commands with increased time Safety/Judgement: Decreased awareness of safety;Decreased awareness of deficits Awareness: Emergent Problem Solving:  Slow processing;Difficulty sequencing;Requires verbal cues;Requires tactile cues General Comments: required vcs for safe walker use         Exercises     Shoulder Instructions       General Comments      Pertinent Vitals/ Pain       Pain Assessment: No/denies pain  Home Living                                          Prior Functioning/Environment              Frequency  Min 2X/week        Progress Toward Goals  OT Goals(current goals can now be found in the care plan section)  Progress towards OT goals: Progressing toward goals  Acute Rehab OT Goals Patient Stated Goal: wants to get well and be able to care for her dog OT Goal Formulation: With patient Time For Goal Achievement: 04/06/21 Potential to Achieve Goals: Good ADL Goals Pt Will Perform Grooming: standing;with supervision Pt Will Perform Lower Body Dressing: with supervision;sit to/from stand Pt Will Transfer to Toilet: with supervision;ambulating Additional ADL Goal #1: Pt will demonstrate anticipatory awareness for safe completion of ADL/IADL.  Plan Discharge plan remains appropriate    Co-evaluation                 AM-PAC OT "6 Clicks" Daily Activity     Outcome Measure   Help from another person eating meals?: A Little Help from another person taking care of personal grooming?: A Little Help from another person toileting, which includes using toliet, bedpan, or urinal?: A Little   Help from another person to put on and taking off regular upper body clothing?: A Little Help from another person to put on and taking off regular lower body clothing?: A Lot 6 Click Score: 14    End of Session Equipment Utilized During Treatment: Gait belt;Rolling walker  OT Visit Diagnosis: Unsteadiness on feet (R26.81);History of falling (Z91.81);Muscle weakness (generalized) (M62.81)   Activity Tolerance Patient tolerated treatment well   Patient Left in chair;with call bell/phone within reach;with chair alarm set   Nurse Communication Mobility status        Time: 5374-8270 OT Time Calculation (min): 20  min  Charges: OT General Charges $OT Visit: 1 Visit OT Treatments $Self Care/Home Management : 8-22 mins  Lodema Hong, OTA   [name] 04/01/2021, 10:12 AM

## 2021-04-01 NOTE — Plan of Care (Signed)

## 2021-04-01 NOTE — TOC Progression Note (Signed)
Transition of Care [facility]) - Progression Note    Patient Details  Name: [name] MRN: [id number] Date of Birth: 1955/12/16  Transition of Care [facility]) CM/SW [place], [place] Phone Number: 04/01/2021, 4:13 PM  Clinical Narrative:    Wandra Feinstein has not received any updates from [facility]. CSW contacted appeals number but was told CSW needed to call a different number to request a Physician Courtesy Review. CSW called that number (which was the same number CSW originally called) and was told that CSW had to speak with the appeals department instead. CSW contacted number provided, [phone], and spoke with Robin. Shirlean Mylar stated that she was the member services line and CSW needed to contact the provider line. She was unable to provide me with phone number, but transferred CSW to a different number, which lead to a voicemail, which CSW left.    Expected Discharge Plan: [place] Barriers to Discharge: Continued Medical Work up  Expected Discharge Plan and Services Expected Discharge Plan: [place] In-house Referral: Clinical Social Work Discharge Planning Services: CM Consult   Living arrangements for the past 2 months: Apartment                                       Social Determinants of Health (SDOH) Interventions    Readmission Risk Interventions No flowsheet data found.

## 2021-04-02 DIAGNOSIS — I10 Essential (primary) hypertension: Secondary | ICD-10-CM | POA: Diagnosis not present

## 2021-04-02 DIAGNOSIS — F10230 Alcohol dependence with withdrawal, uncomplicated: Secondary | ICD-10-CM | POA: Diagnosis not present

## 2021-04-02 DIAGNOSIS — E871 Hypo-osmolality and hyponatremia: Secondary | ICD-10-CM | POA: Diagnosis not present

## 2021-04-02 DIAGNOSIS — R5381 Other malaise: Secondary | ICD-10-CM | POA: Diagnosis not present

## 2021-04-02 LAB — RENAL FUNCTION PANEL
Albumin: 3.2 g/dL — ABNORMAL LOW (ref 3.5–5.0)
Albumin: 3.1 g/dL — ABNORMAL LOW (ref 3.5–5.0)
Anion gap: 10 (ref 5–15)
Anion gap: 10 (ref 5–15)
BUN: 13 mg/dL (ref 8–23)
BUN: 12 mg/dL (ref 8–23)
CO2: 21 mmol/L — ABNORMAL LOW (ref 22–32)
CO2: 18 mmol/L — ABNORMAL LOW (ref 22–32)
Calcium: 9.3 mg/dL (ref 8.9–10.3)
Calcium: 8.8 mg/dL — ABNORMAL LOW (ref 8.9–10.3)
Chloride: 93 mmol/L — ABNORMAL LOW (ref 98–111)
Chloride: 99 mmol/L (ref 98–111)
Creatinine, Ser: 0.62 mg/dL (ref 0.44–1.00)
Creatinine, Ser: 0.75 mg/dL (ref 0.44–1.00)
GFR, Estimated: >60 mL/min (ref >60)
GFR, Estimated: >60 mL/min (ref >60)
Glucose, Bld: 96 mg/dL (ref 70–99)
Glucose, Bld: 104 mg/dL — ABNORMAL HIGH (ref 70–99)
Phosphorus: 3.7 mg/dL (ref 2.5–4.6)
Phosphorus: 2.9 mg/dL (ref 2.5–4.6)
Potassium: 3.6 mmol/L (ref 3.5–5.1)
Potassium: 3.9 mmol/L (ref 3.5–5.1)
Sodium: 124 mmol/L — ABNORMAL LOW (ref 135–145)
Sodium: 127 mmol/L — ABNORMAL LOW (ref 135–145)

## 2021-04-02 MED ORDER — SODIUM CHLORIDE 0.9 % IV SOLN: INTRAVENOUS | Status: DC

## 2021-04-02 NOTE — Progress Notes (Signed)
Progress Note    [name]  VVZ:[id number] DOB: 06-18-1956  DOA: 03/21/2021 PCP: Ann Held, DO    Brief Narrative:     Medical records reviewed and are as summarized below:  [name] is an 65 y.o. female with medical history significant of hypertension, alcohol abuse, anxiety, and depression presents with what she reports is an alcohol problem.  Admits to drinking 4-5 beers per day on average and intermittently drinking wine for over 30 years.  Her last drink was 2 days ago.  Her friend Daine Floras was able to provide additional history over the phone. Her friend tried to take her to [facility] in [place] for alcohol rehab, but she was declined admission as she was significantly debilitated needing assistance, in the ER she was found to be in early DTs, had ataxia and difficulty in standing up and ambulating.  She was admitted for further care.  Assessment/Plan:   Principal Problem:   Alcohol withdrawal ([place]) Active Problems:   Essential hypertension   Hyponatremia   Anxiety and depression   Gait disturbance   Falls frequently   Alcohol abuse   Alcohol abuse with early DTs.- Counseled to quit alcohol, was placed on Librium and CIWA protocol, this problem has resolved.   Multiple falls with some ataxia.  MRI brain nonacute however limited due to motion artifact, continue PT OT, continue replacement of thiamine and B12, B12 levels were borderline, thiamine levels were added to blood work however she has received some supplemental thiamine by that time, thiamine levels ok  Could have early Wernicke's encephalopathy   She is extremely weak, lives alone and will require SNF, continue PT OT, gradual improvement in her balance and strength.    Hypomagnesemia, Hypokalemia .   -replaced K &  Mag   SIADH -clinically euvolemic , U.Na> 70, U Osm >> S Osm, SIADH, post single dose Samsca on 03/26/21 with 125>>129, dropped again  03/28/21 when Samsca was held, Na improving again after Samsca dose on 03/28/2021, repeat Samsca again on 03/29/2021. -fluid restrict -nephrology consult appreciated: She appears to have a low urine osmolality as well as urine sodium that is low.  This to be more consistent with poor solute intake.  I would be consistent with a history.  I would recommend increasing solute intake and salt tabs.  IVF and recheck BMP this PM   Hypertension.  Blood pressure medications adjusted as BP lower end of normal   possible underlying alcoholic cirrhosis.  Ultrasound noted.  stable INR and LFTs.  Outpatient GI follow-up   Family Communication/Anticipated D/C date and plan/Code Status   DVT prophylaxis: Lovenox ordered. Code Status: Full Code.  Disposition Plan: Status is: Inpatient  Remains inpatient appropriate because:Inpatient level of care appropriate due to severity of illness  Dispo: The patient is from: Home              Anticipated d/c is to: SNF   Difficult to place patient No         Medical Consultants:   Nephrology     Subjective:   All questions answered, no HA, no blurry vision  Objective:    Vitals:   04/01/21 1934 04/01/21 2347 04/02/21 0419 04/02/21 0749  BP: (!) 154/76 133/83 130/89 122/88  Pulse: 78 75 78 86  Resp: 18 17 18 17   Temp: 98.6 F (37 C) 98.7 F (37.1 C) 98.7 F (37.1 C) 98.8 F (37.1 C)  TempSrc: Oral  Oral Oral Oral  SpO2: 98% 98% 98% 100%  Weight:      Height:        Intake/Output Summary (Last 24 hours) at 04/02/2021 1035 Last data filed at 04/02/2021 4496 Gross per 24 hour  Intake 485 ml  Output 2200 ml  Net -1715 ml   Filed Weights   03/22/21 2344 03/27/21 0315 03/28/21 0400  Weight: 47.5 kg 47.7 kg 50 kg    Exam:   General: Appearance:    Chronically ill appearing female in no acute distress     Lungs:     Clear to auscultation bilaterally, respirations unlabored  Heart:    Normal heart rate.    MS:   All extremities are  intact.    Neurologic:   Awake, alert, oriented x 3. No apparent focal neurological           defect.     Data Reviewed:   I have personally reviewed following labs and imaging studies:  Labs: Labs show the following:   Basic Metabolic Panel: Recent Labs  Lab 03/27/21 0113 03/28/21 0337 03/30/21 0157 03/30/21 1509 03/31/21 0146 04/01/21 0103 04/02/21 0701  NA 129*   < > 128* 127* 126* 126* 124*  K 4.1   < > 3.8 3.7 3.9 3.8 3.6  CL 95*   < > 92* 91* 94* 94* 93*  CO2 23   < > 24 25 23 22  21*  GLUCOSE 95   < > 101* 94 102* 91 96  BUN 10   < > 12 13 12 10 13   CREATININE 0.83   < > 0.70 0.78 0.76 0.69 0.62  CALCIUM 9.5   < > 9.7 9.4 9.2 9.1 9.3  MG 1.8  --   --   --   --   --   --   PHOS  --   --   --   --   --   --  3.7   < > = values in this interval not displayed.   GFR Estimated Creatinine Clearance: 51 mL/min (by C-G formula based on SCr of 0.62 mg/dL). Liver Function Tests: Recent Labs  Lab 03/27/21 0113 04/02/21 0701  AST 33  --   ALT 30  --   ALKPHOS 55  --   BILITOT 0.9  --   PROT 5.9*  --   ALBUMIN 3.4* 3.2*   No results for input(s): LIPASE, AMYLASE in the last 168 hours. No results for input(s): AMMONIA in the last 168 hours. Coagulation profile No results for input(s): INR, PROTIME in the last 168 hours.  CBC: Recent Labs  Lab 03/27/21 0113  WBC 6.4  NEUTROABS 4.5  HGB 12.9  HCT 35.9*  MCV 104.4*  PLT 263   Cardiac Enzymes: No results for input(s): CKTOTAL, CKMB, CKMBINDEX, TROPONINI in the last 168 hours. BNP (last 3 results) No results for input(s): PROBNP in the last 8760 hours. CBG: No results for input(s): GLUCAP in the last 168 hours. D-Dimer: No results for input(s): DDIMER in the last 72 hours. Hgb A1c: No results for input(s): HGBA1C in the last 72 hours. Lipid Profile: No results for input(s): CHOL, HDL, LDLCALC, TRIG, CHOLHDL, LDLDIRECT in the last 72 hours. Thyroid function studies: No results for input(s): TSH, T4TOTAL,  T3FREE, THYROIDAB in the last 72 hours.  Invalid input(s): FREET3 Anemia work up: No results for input(s): VITAMINB12, FOLATE, FERRITIN, TIBC, IRON, RETICCTPCT in the last 72 hours. Sepsis Labs: Recent Labs  Lab 03/27/21  0113  WBC 6.4    Microbiology Recent Results (from the past 240 hour(s))  SARS CORONAVIRUS 2 (TAT 6-24 HRS) Nasopharyngeal Nasopharyngeal Swab     Status: None   Collection Time: 03/25/21  4:00 AM   Specimen: Nasopharyngeal Swab  Result Value Ref Range Status   SARS Coronavirus 2 NEGATIVE NEGATIVE Final    Comment: (NOTE) SARS-CoV-2 target nucleic acids are NOT DETECTED.  The SARS-CoV-2 RNA is generally detectable in upper and lower respiratory specimens during the acute phase of infection. Negative results do not preclude SARS-CoV-2 infection, do not rule out co-infections with other pathogens, and should not be used as the sole basis for treatment or other patient management decisions. Negative results must be combined with clinical observations, patient history, and epidemiological information. The expected result is Negative.  Fact Sheet for Patients: SugarRoll.be  Fact Sheet for Healthcare Providers: https://www.woods-mathews.com/  This test is not yet approved or cleared by the [place] FDA and  has been authorized for detection and/or diagnosis of SARS-CoV-2 by FDA under an Emergency Use Authorization (EUA). This EUA will remain  in effect (meaning this test can be used) for the duration of the COVID-19 declaration under Se ction 564(b)(1) of the Act, 21 U.S.C. section 360bbb-3(b)(1), unless the authorization is terminated or revoked sooner.  Performed at [facility], [place] [address] [place],  [address]     Procedures and diagnostic studies:  No results found.  Medications:    enoxaparin (LOVENOX) injection  40 mg Subcutaneous F16B   folic acid  1 mg Oral Daily   metoprolol  succinate  50 mg Oral Daily   multivitamin with minerals  1 tablet Oral Daily   pantoprazole  40 mg Oral Daily   sodium chloride flush  3 mL Intravenous Q12H   sodium chloride  2 g Oral TID WC   vitamin B-12  1,000 mcg Oral BID   Continuous Infusions:  sodium chloride 100 mL/hr at 04/02/21 0946      LOS: 11 days   Geradine Girt  [facility]   How to contact the [facility] Attending or Consulting provider [place] or covering provider during after hours [place], for this patient?  Check the care team in [facility] and look for a) attending/consulting [facility] provider listed and b) the [facility] team listed Log into [email or web address] and use [place]'s universal password to access. If you do not have the password, please contact the hospital operator. Locate the [facility] provider you are looking for under [facility] and page to a number that you can be directly reached. If you still have difficulty reaching the provider, please page the [facility] (Director on Call) for the Hospitalists listed on amion for assistance.  04/02/2021, 10:35 AM

## 2021-04-03 DIAGNOSIS — F10230 Alcohol dependence with withdrawal, uncomplicated: Secondary | ICD-10-CM | POA: Diagnosis not present

## 2021-04-03 DIAGNOSIS — E871 Hypo-osmolality and hyponatremia: Secondary | ICD-10-CM | POA: Diagnosis not present

## 2021-04-03 LAB — RENAL FUNCTION PANEL
Albumin: 3 g/dL — ABNORMAL LOW (ref 3.5–5.0)
Anion gap: 9 (ref 5–15)
BUN: 9 mg/dL (ref 8–23)
CO2: 21 mmol/L — ABNORMAL LOW (ref 22–32)
Calcium: 8.7 mg/dL — ABNORMAL LOW (ref 8.9–10.3)
Chloride: 98 mmol/L (ref 98–111)
Creatinine, Ser: 0.62 mg/dL (ref 0.44–1.00)
GFR, Estimated: >60 mL/min (ref >60)
Glucose, Bld: 96 mg/dL (ref 70–99)
Phosphorus: 3 mg/dL (ref 2.5–4.6)
Potassium: 3.9 mmol/L (ref 3.5–5.1)
Sodium: 128 mmol/L — ABNORMAL LOW (ref 135–145)

## 2021-04-03 MED ORDER — METOPROLOL SUCCINATE ER 100 MG PO TB24
100.0000 mg | ORAL_TABLET | Freq: Every day | ORAL | Status: DC
  Administered 2021-04-04 – 2021-04-08 (×5): 100 mg via ORAL
  Filled 2021-04-03 (×5): qty 1

## 2021-04-03 NOTE — Progress Notes (Signed)
Progress Note    [name]  IFO:[id number] DOB: 06/05/1956  DOA: 03/21/2021 PCP: Ann Held, DO    Brief Narrative:     Medical records reviewed and are as summarized below:  [name] is an 65 y.o. female with medical history significant of hypertension, alcohol abuse, anxiety, and depression presents with what she reports is an alcohol problem.  Admits to drinking 4-5 beers per day on average and intermittently drinking wine for over 30 years.  Her last drink was 2 days ago.  Her friend Daine Floras was able to provide additional history over the phone. Her friend tried to take her to [facility] in [place] for alcohol rehab, but she was declined admission as she was significantly debilitated needing assistance, in the ER she was found to be in early DTs, had ataxia and difficulty in standing up and ambulating.  She was admitted for further care.  Assessment/Plan:   Principal Problem:   Alcohol withdrawal ([place]) Active Problems:   Essential hypertension   Hyponatremia   Anxiety and depression   Gait disturbance   Falls frequently   Alcohol abuse   Alcohol abuse with early DTs.- Counseled to quit alcohol, was placed on Librium and CIWA protocol, this problem has resolved.   Multiple falls with some ataxia.  MRI brain nonacute however limited due to motion artifact, continue PT OT, continue replacement of thiamine and B12, B12 levels were borderline, thiamine levels were added to blood work however she has received some supplemental thiamine by that time, thiamine levels ok  Could have early Wernicke's encephalopathy   She is extremely weak, lives alone and will require SNF, continue PT OT, gradual improvement in her balance and strength.    Hypomagnesemia, Hypokalemia .   -replaced K &  Mag   hyponatremia -clinically euvolemic , U.Na> 70, U Osm >> S Osm, SIADH, post single dose Samsca on 03/26/21 with 125>>129, dropped  again 03/28/21 when Samsca was held, Na improving again after Samsca dose on 03/28/2021, repeat Samsca again on 03/29/2021. -fluid restrict -nephrology consult appreciated: She appears to have a low urine osmolality as well as urine sodium that is low.  This to be more consistent with poor solute intake.  I would recommend increasing solute intake and salt tabs.   Hypertension.  Blood pressure medications adjusted   possible underlying alcoholic cirrhosis.  Ultrasound noted.  stable INR and LFTs.  Outpatient GI follow-up   Family Communication/Anticipated D/C date and plan/Code Status   DVT prophylaxis: Lovenox ordered. Code Status: Full Code.  Disposition Plan: Status is: Inpatient  Remains inpatient appropriate because:Inpatient level of care appropriate due to severity of illness  Dispo: The patient is from: Home              Anticipated d/c is to: SNF   Difficult to place patient No         Medical Consultants:   Nephrology     Subjective:   No HA, no SOB  Objective:    Vitals:   04/02/21 1957 04/02/21 2337 04/03/21 0352 04/03/21 0809  BP: 140/84 (!) 138/97 (!) 165/98 (!) 140/94  Pulse: 86 74 80 83  Resp: 18 18 18 17   Temp: 98.7 F (37.1 C) 98.9 F (37.2 C) 98.3 F (36.8 C) 98.7 F (37.1 C)  TempSrc: Oral Oral Oral Oral  SpO2: 100% 100% 100% 99%  Weight:      Height:  Intake/Output Summary (Last 24 hours) at 04/03/2021 1143 Last data filed at 04/03/2021 0357 Gross per 24 hour  Intake 1712.22 ml  Output 2600 ml  Net -887.78 ml   Filed Weights   03/22/21 2344 03/27/21 0315 03/28/21 0400  Weight: 47.5 kg 47.7 kg 50 kg    Exam:   General: Appearance:    Chronically ill appearing female in no acute distress     Lungs:     respirations unlabored  Heart:    Normal heart rate.    MS:   All extremities are intact.    Neurologic:   Awake, alert, oriented x 3      Data Reviewed:   I have personally reviewed following labs and imaging  studies:  Labs: Labs show the following:   Basic Metabolic Panel: Recent Labs  Lab 03/31/21 0146 04/01/21 0103 04/02/21 0701 04/02/21 1714 04/03/21 0117  NA 126* 126* 124* 127* 128*  K 3.9 3.8 3.6 3.9 3.9  CL 94* 94* 93* 99 98  CO2 23 22 21* 18* 21*  GLUCOSE 102* 91 96 104* 96  BUN 12 10 13 12 9   CREATININE 0.76 0.69 0.62 0.75 0.62  CALCIUM 9.2 9.1 9.3 8.8* 8.7*  PHOS  --   --  3.7 2.9 3.0   GFR Estimated Creatinine Clearance: 51 mL/min (by C-G formula based on SCr of 0.62 mg/dL). Liver Function Tests: Recent Labs  Lab 04/02/21 0701 04/02/21 1714 04/03/21 0117  ALBUMIN 3.2* 3.1* 3.0*   No results for input(s): LIPASE, AMYLASE in the last 168 hours. No results for input(s): AMMONIA in the last 168 hours. Coagulation profile No results for input(s): INR, PROTIME in the last 168 hours.  CBC: No results for input(s): WBC, NEUTROABS, HGB, HCT, MCV, PLT in the last 168 hours.  Cardiac Enzymes: No results for input(s): CKTOTAL, CKMB, CKMBINDEX, TROPONINI in the last 168 hours. BNP (last 3 results) No results for input(s): PROBNP in the last 8760 hours. CBG: No results for input(s): GLUCAP in the last 168 hours. D-Dimer: No results for input(s): DDIMER in the last 72 hours. Hgb A1c: No results for input(s): HGBA1C in the last 72 hours. Lipid Profile: No results for input(s): CHOL, HDL, LDLCALC, TRIG, CHOLHDL, LDLDIRECT in the last 72 hours. Thyroid function studies: No results for input(s): TSH, T4TOTAL, T3FREE, THYROIDAB in the last 72 hours.  Invalid input(s): FREET3 Anemia work up: No results for input(s): VITAMINB12, FOLATE, FERRITIN, TIBC, IRON, RETICCTPCT in the last 72 hours. Sepsis Labs: No results for input(s): PROCALCITON, WBC, LATICACIDVEN in the last 168 hours.   Microbiology Recent Results (from the past 240 hour(s))  SARS CORONAVIRUS 2 (TAT 6-24 HRS) Nasopharyngeal Nasopharyngeal Swab     Status: None   Collection Time: 03/25/21  4:00 AM    Specimen: Nasopharyngeal Swab  Result Value Ref Range Status   SARS Coronavirus 2 NEGATIVE NEGATIVE Final    Comment: (NOTE) SARS-CoV-2 target nucleic acids are NOT DETECTED.  The SARS-CoV-2 RNA is generally detectable in upper and lower respiratory specimens during the acute phase of infection. Negative results do not preclude SARS-CoV-2 infection, do not rule out co-infections with other pathogens, and should not be used as the sole basis for treatment or other patient management decisions. Negative results must be combined with clinical observations, patient history, and epidemiological information. The expected result is Negative.  Fact Sheet for Patients: SugarRoll.be  Fact Sheet for Healthcare Providers: https://www.woods-mathews.com/  This test is not yet approved or cleared by the [place]  States FDA and  has been authorized for detection and/or diagnosis of SARS-CoV-2 by FDA under an Emergency Use Authorization (EUA). This EUA will remain  in effect (meaning this test can be used) for the duration of the COVID-19 declaration under Se ction 564(b)(1) of the Act, 21 U.S.C. section 360bbb-3(b)(1), unless the authorization is terminated or revoked sooner.  Performed at [facility], [place] [address] [place], [place] [address]     Procedures and diagnostic studies:  No results found.  Medications:    enoxaparin (LOVENOX) injection  40 mg Subcutaneous Q76A   folic acid  1 mg Oral Daily   [START ON 04/04/2021] metoprolol succinate  100 mg Oral Daily   multivitamin with minerals  1 tablet Oral Daily   pantoprazole  40 mg Oral Daily   sodium chloride flush  3 mL Intravenous Q12H   sodium chloride  2 g Oral TID WC   vitamin B-12  1,000 mcg Oral BID   Continuous Infusions:      LOS: 12 days   Geradine Girt  [facility]   How to contact the [facility] Attending or Consulting provider [place] or covering provider  during after hours [place], for this patient?  Check the care team in [facility] and look for a) attending/consulting TRH provider listed and b) the [facility] team listed Log into [email or web address] and use [place]'s universal password to access. If you do not have the password, please contact the hospital operator. Locate the [facility] provider you are looking for under [facility] and page to a number that you can be directly reached. If you still have difficulty reaching the provider, please page the [facility] (Director on Call) for the Hospitalists listed on amion for assistance.  04/03/2021, 11:43 AM

## 2021-04-03 NOTE — Progress Notes (Signed)
Physical Therapy Treatment Patient Details Name: [name] MRN: [id number] DOB: 10/11/1955 Today's Date: 04/03/2021   History of Present Illness Pt presented to ED on 9/12 with etoh abuse with withdrawals and frequent falls at home. Pt also with hyponatremia. Remains hospitalized due to unsafe d/c plan. 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    PT Comments    Patient progressing slowly towards PT goals. Seems to have improved cognition today. Tolerated transfers and gait training with Min-Mod A for support. Continues to have stiff and unstable gait requiring support of RW vs HHA vs rails. LOB with turns. Pt continues to be a high fall risk. Performed ADL session at sink with close Min guard but needs UE support for balance. Highly motivated to mobilize and regain strength. Continue to recommend SNF as pt not safe to return home due to safety concerns. Will follow acutely.   Recommendations for follow up therapy are one component of a multi-disciplinary discharge planning process, led by the attending physician.  Recommendations may be updated based on patient status, additional functional criteria and insurance authorization.  Follow Up Recommendations  SNF     Equipment Recommendations  Rolling walker with 5" wheels    Recommendations for Other Services       Precautions / Restrictions Precautions Precautions: Fall Restrictions Weight Bearing Restrictions: No     Mobility  Bed Mobility               General bed mobility comments: Up in chair upon PT arrival.    Transfers Overall transfer level: Needs assistance Equipment used: Rolling walker (2 wheeled);None Transfers: Sit to/from Stand Sit to Stand: Min guard;Min assist         General transfer comment: Min A initially with cues for hand placement progressing to Min guard assist. Stood from chair x7.  Ambulation/Gait Ambulation/Gait assistance: Mod assist;Min assist Gait Distance  (Feet): 50 Feet (x2) Assistive device: Rolling walker (2 wheeled);1 person hand held assist Gait Pattern/deviations: Step-through pattern;Wide base of support;Trunk flexed;Step-to pattern Gait velocity: decr Gait velocity interpretation: <1.8 ft/sec, indicate of risk for recurrent falls General Gait Details: Slow, waddling-like step to gait pattern requiring Min-Mod A for support; cues to increase step lengths and for forward momentum. Stiff throughout with decreased knee flexion during swing phase. Able to carry over with cues but not sustain.   Stairs             Wheelchair Mobility    Modified Rankin (Stroke Patients Only)       Balance Overall balance assessment: Needs assistance Sitting-balance support: No upper extremity supported;Feet supported Sitting balance-Leahy Scale: Good     Standing balance support: During functional activity Standing balance-Leahy Scale: Fair Standing balance comment: Able to stand at sink with close min guard requiring UE support for brushing teeth/hair. Wide BoS.                            Cognition Arousal/Alertness: Awake/alert Behavior During Therapy: WFL for tasks assessed/performed Overall Cognitive Status: Impaired/Different from baseline Area of Impairment: Attention;Following commands;Safety/judgement;Awareness;Problem solving                   Current Attention Level: Selective   Following Commands: Follows one step commands with increased time;Follows one step commands consistently Safety/Judgement: Decreased awareness of safety;Decreased awareness of deficits Awareness: Emergent Problem Solving: Slow processing;Difficulty sequencing;Requires verbal cues;Requires tactile cues General Comments: Seems improved today; understands she is weak  because she is stuck in the bed here and has been trying to stay in chair for as long as she can. Motivated.      Exercises      General Comments General comments  (skin integrity, edema, etc.): Understands she needs to move to get stronger and requesting help to do so. Informed her to be getting up with nursing to walkt o bathroom and use sink etc instead of the purewick.      Pertinent Vitals/Pain Pain Assessment: No/denies pain    Home Living                      Prior Function            PT Goals (current goals can now be found in the care plan section) Acute Rehab PT Goals Patient Stated Goal: to get stronger and get out of here PT Goal Formulation: With patient Time For Goal Achievement: 04/17/21 Potential to Achieve Goals: Good Progress towards PT goals: Progressing toward goals    Frequency    Min 3X/week      PT Plan Current plan remains appropriate    Co-evaluation              AM-PAC PT "6 Clicks" Mobility   Outcome Measure  Help needed turning from your back to your side while in a flat bed without using bedrails?: A Little Help needed moving from lying on your back to sitting on the side of a flat bed without using bedrails?: A Little Help needed moving to and from a bed to a chair (including a wheelchair)?: A Little Help needed standing up from a chair using your arms (e.g., wheelchair or bedside chair)?: A Little Help needed to walk in hospital room?: A Lot Help needed climbing 3-5 steps with a railing? : A Lot 6 Click Score: 16    End of Session Equipment Utilized During Treatment: Gait belt Activity Tolerance: Patient tolerated treatment well Patient left: in chair;with call bell/phone within reach;with chair alarm set Nurse Communication: Mobility status PT Visit Diagnosis: Unsteadiness on feet (R26.81);Repeated falls (R29.6);Ataxic gait (R26.0)     Time: 6389-3734 PT Time Calculation (min) (ACUTE ONLY): 28 min  Charges:  $Gait Training: 8-22 mins $Therapeutic Activity: 8-22 mins                     Marisa Severin, PT, DPT Acute Rehabilitation Services Pager [phone] Office  [place] 04/03/2021, 3:32 PM

## 2021-04-04 DIAGNOSIS — F10230 Alcohol dependence with withdrawal, uncomplicated: Secondary | ICD-10-CM | POA: Diagnosis not present

## 2021-04-04 DIAGNOSIS — E871 Hypo-osmolality and hyponatremia: Secondary | ICD-10-CM | POA: Diagnosis not present

## 2021-04-04 LAB — URINALYSIS, COMPLETE (UACMP) WITH MICROSCOPIC
Bacteria, UA: NONE SEEN
Bilirubin Urine: NEGATIVE
Glucose, UA: NEGATIVE mg/dL
Ketones, ur: NEGATIVE mg/dL
Nitrite: NEGATIVE
Protein, ur: NEGATIVE mg/dL
Specific Gravity, Urine: 1.005 (ref 1.005–1.030)
WBC, UA: >50 WBC/hpf — ABNORMAL HIGH (ref 0–5)
pH: 6 (ref 5.0–8.0)

## 2021-04-04 LAB — URINALYSIS, MICROSCOPIC (REFLEX): WBC, UA: >50 WBC/hpf (ref 0–5)

## 2021-04-04 LAB — RENAL FUNCTION PANEL
Albumin: 3.2 g/dL — ABNORMAL LOW (ref 3.5–5.0)
Anion gap: 11 (ref 5–15)
BUN: 11 mg/dL (ref 8–23)
CO2: 21 mmol/L — ABNORMAL LOW (ref 22–32)
Calcium: 9.2 mg/dL (ref 8.9–10.3)
Chloride: 96 mmol/L — ABNORMAL LOW (ref 98–111)
Creatinine, Ser: 0.78 mg/dL (ref 0.44–1.00)
GFR, Estimated: >60 mL/min (ref >60)
Glucose, Bld: 99 mg/dL (ref 70–99)
Phosphorus: 3.2 mg/dL (ref 2.5–4.6)
Potassium: 3.3 mmol/L — ABNORMAL LOW (ref 3.5–5.1)
Sodium: 128 mmol/L — ABNORMAL LOW (ref 135–145)

## 2021-04-04 LAB — URINALYSIS, ROUTINE W REFLEX MICROSCOPIC

## 2021-04-04 MED ORDER — LISINOPRIL 40 MG PO TABS
40.0000 mg | ORAL_TABLET | Freq: Every day | ORAL | Status: DC
  Administered 2021-04-04 – 2021-04-08 (×5): 40 mg via ORAL
  Filled 2021-04-04 (×5): qty 1

## 2021-04-04 MED ORDER — RISAQUAD PO CAPS
1.0000 | ORAL_CAPSULE | Freq: Every day | ORAL | Status: DC
  Administered 2021-04-04 – 2021-04-08 (×5): 1 via ORAL
  Filled 2021-04-04 (×5): qty 1

## 2021-04-04 MED ORDER — POTASSIUM CHLORIDE CRYS ER 20 MEQ PO TBCR
40.0000 meq | EXTENDED_RELEASE_TABLET | Freq: Once | ORAL | Status: AC
  Administered 2021-04-04: 40 meq via ORAL
  Filled 2021-04-04: qty 2

## 2021-04-04 NOTE — Progress Notes (Signed)
HOSPITAL MEDICINE OVERNIGHT EVENT NOTE    Notified by nursing that patient once again is exhibiting a post void residual 350 cc.  Order placed for intermittent catheterization.  Urinalysis performed this morning unfortunately was nondiagnostic.  Ordering repeat sample now that can be obtained with his catheterization.  Vernelle Emerald  MD [facility]

## 2021-04-04 NOTE — Progress Notes (Signed)
Progress Note    [name]  ZOX:[id number] DOB: 03-Oct-1955  DOA: 03/21/2021 PCP: Ann Held, DO    Brief Narrative:     Medical records reviewed and are as summarized below:  [name] is an 65 y.o. female with medical history significant of hypertension, alcohol abuse, anxiety, and depression presents with what she reports is an alcohol problem.  Admits to drinking 4-5 beers per day on average and intermittently drinking wine for over 30 years.  Her last drink was 2 days ago.  Her friend Daine Floras was able to provide additional history over the phone. Her friend tried to take her to [facility] in [place] for alcohol rehab, but she was declined admission as she was significantly debilitated needing assistance, in the ER she was found to be in early DTs, had ataxia and difficulty in standing up and ambulating.  She was admitted for further care.   Assessment/Plan:   Principal Problem:   Alcohol withdrawal ([place]) Active Problems:   Essential hypertension   Hyponatremia   Anxiety and depression   Gait disturbance   Falls frequently   Alcohol abuse   Alcohol abuse with early DTs.- Counseled to quit alcohol, was placed on Librium and CIWA protocol, this problem has resolved.   Multiple falls with some ataxia.  MRI brain nonacute however limited due to motion artifact, continue PT OT, continue replacement of thiamine and B12, B12 levels were borderline, thiamine levels were added to blood work however she has received some supplemental thiamine by that time, thiamine levels ok  Could have early Wernicke's encephalopathy   She is extremely weak, lives alone and will require SNF, continue PT OT, gradual improvement in her balance and strength.    Hypomagnesemia, Hypokalemia .   -replaced K &  Mag  hyponatremia -clinically euvolemic , U.Na> 70, U Osm >> S Osm, SIADH, post single dose Samsca on 03/26/21 with 125>>129, dropped  again 03/28/21 when Samsca was held, Na improving again after Samsca dose on 03/28/2021, repeat Samsca again on 03/29/2021. -fluid restrict -nephrology consult appreciated: She appears to have a low urine osmolality as well as urine sodium that is low.  This to be more consistent with poor solute intake.  I would recommend increasing solute intake and salt tabs.   Hypertension.  Blood pressure medications adjusted   possible underlying alcoholic cirrhosis.  Ultrasound noted.  stable INR and LFTs.  Outpatient GI follow-up   Family Communication/Anticipated D/C date and plan/Code Status   DVT prophylaxis: Lovenox ordered. Code Status: Full Code.  Disposition Plan: Status is: Inpatient  Remains inpatient appropriate because:Inpatient level of care appropriate due to severity of illness  Dispo: The patient is from: Home              Anticipated d/c is to: SNF   Difficult to place patient No         Medical Consultants:   Nephrology     Subjective:   Had urinary retention overnight  Objective:    Vitals:   04/03/21 2011 04/03/21 2349 04/04/21 0758 04/04/21 1226  BP: (!) 149/91 (!) 142/94 (!) 156/99 (!) 137/93  Pulse: 72 72 84 87  Resp: 16 16 15 17   Temp: 97.8 F (36.6 C) 98.9 F (37.2 C) 97.6 F (36.4 C) 98 F (36.7 C)  TempSrc: Oral Oral Oral Oral  SpO2: 100% 100% 100% 100%  Weight:      Height:  Intake/Output Summary (Last 24 hours) at 04/04/2021 1428 Last data filed at 04/04/2021 1228 Gross per 24 hour  Intake 840 ml  Output 2871 ml  Net -2031 ml   Filed Weights   03/22/21 2344 03/27/21 0315 03/28/21 0400  Weight: 47.5 kg 47.7 kg 50 kg    Exam:   General: Appearance:    Chronically ill appearing female in no acute distress     Lungs:      respirations unlabored  Heart:    Normal heart rate.    MS:   All extremities are intact.    Neurologic:   Awake, alert        Data Reviewed:   I have personally reviewed following labs and imaging  studies:  Labs: Labs show the following:   Basic Metabolic Panel: Recent Labs  Lab 04/01/21 0103 04/02/21 0701 04/02/21 1714 04/03/21 0117 04/04/21 0135  NA 126* 124* 127* 128* 128*  K 3.8 3.6 3.9 3.9 3.3*  CL 94* 93* 99 98 96*  CO2 22 21* 18* 21* 21*  GLUCOSE 91 96 104* 96 99  BUN 10 13 12 9 11   CREATININE 0.69 0.62 0.75 0.62 0.78  CALCIUM 9.1 9.3 8.8* 8.7* 9.2  PHOS  --  3.7 2.9 3.0 3.2   GFR Estimated Creatinine Clearance: 51 mL/min (by C-G formula based on SCr of 0.78 mg/dL). Liver Function Tests: Recent Labs  Lab 04/02/21 0701 04/02/21 1714 04/03/21 0117 04/04/21 0135  ALBUMIN 3.2* 3.1* 3.0* 3.2*   No results for input(s): LIPASE, AMYLASE in the last 168 hours. No results for input(s): AMMONIA in the last 168 hours. Coagulation profile No results for input(s): INR, PROTIME in the last 168 hours.  CBC: No results for input(s): WBC, NEUTROABS, HGB, HCT, MCV, PLT in the last 168 hours.  Cardiac Enzymes: No results for input(s): CKTOTAL, CKMB, CKMBINDEX, TROPONINI in the last 168 hours. BNP (last 3 results) No results for input(s): PROBNP in the last 8760 hours. CBG: No results for input(s): GLUCAP in the last 168 hours. D-Dimer: No results for input(s): DDIMER in the last 72 hours. Hgb A1c: No results for input(s): HGBA1C in the last 72 hours. Lipid Profile: No results for input(s): CHOL, HDL, LDLCALC, TRIG, CHOLHDL, LDLDIRECT in the last 72 hours. Thyroid function studies: No results for input(s): TSH, T4TOTAL, T3FREE, THYROIDAB in the last 72 hours.  Invalid input(s): FREET3 Anemia work up: No results for input(s): VITAMINB12, FOLATE, FERRITIN, TIBC, IRON, RETICCTPCT in the last 72 hours. Sepsis Labs: No results for input(s): PROCALCITON, WBC, LATICACIDVEN in the last 168 hours.   Microbiology No results found for this or any previous visit (from the past 240 hour(s)).   Procedures and diagnostic studies:  No results found.  Medications:     acidophilus  1 capsule Oral Daily   enoxaparin (LOVENOX) injection  40 mg Subcutaneous G83M   folic acid  1 mg Oral Daily   lisinopril  40 mg Oral Daily   metoprolol succinate  100 mg Oral Daily   multivitamin with minerals  1 tablet Oral Daily   pantoprazole  40 mg Oral Daily   sodium chloride flush  3 mL Intravenous Q12H   sodium chloride  2 g Oral TID WC   vitamin B-12  1,000 mcg Oral BID   Continuous Infusions:      LOS: 13 days   Geradine Girt  [facility]   How to contact the [facility] Attending or Consulting provider [place] or covering  provider during after hours [place], for this patient?  Check the care team in [facility] and look for a) attending/consulting TRH provider listed and b) the [facility] team listed Log into [email or web address] and use [place]'s universal password to access. If you do not have the password, please contact the hospital operator. Locate the [facility] provider you are looking for under [facility] and page to a number that you can be directly reached. If you still have difficulty reaching the provider, please page the [facility] (Director on Call) for the Hospitalists listed on amion for assistance.  04/04/2021, 2:28 PM

## 2021-04-04 NOTE — Progress Notes (Signed)
HOSPITAL MEDICINE OVERNIGHT EVENT NOTE    Notified by nursing that patient initially had greater than 500 cc in her bladder, voided and still had 354 cc in the bladder postvoid residual.  Patient denies abdominal pain.  We will perform intermittent urinary catheterization and monitor for recurrence.  Vernelle Emerald  MD [facility]

## 2021-04-04 NOTE — Plan of Care (Signed)
  Problem: Health Behavior/Discharge Planning: Goal: Ability to manage health-related needs will improve Outcome: Progressing   

## 2021-04-05 DIAGNOSIS — F10230 Alcohol dependence with withdrawal, uncomplicated: Secondary | ICD-10-CM | POA: Diagnosis not present

## 2021-04-05 DIAGNOSIS — I10 Essential (primary) hypertension: Secondary | ICD-10-CM | POA: Diagnosis not present

## 2021-04-05 LAB — RENAL FUNCTION PANEL
Albumin: 3.1 g/dL — ABNORMAL LOW (ref 3.5–5.0)
Anion gap: 9 (ref 5–15)
BUN: 13 mg/dL (ref 8–23)
CO2: 19 mmol/L — ABNORMAL LOW (ref 22–32)
Calcium: 9.2 mg/dL (ref 8.9–10.3)
Chloride: 97 mmol/L — ABNORMAL LOW (ref 98–111)
Creatinine, Ser: 0.73 mg/dL (ref 0.44–1.00)
GFR, Estimated: >60 mL/min (ref >60)
Glucose, Bld: 87 mg/dL (ref 70–99)
Phosphorus: 3.5 mg/dL (ref 2.5–4.6)
Potassium: 4.1 mmol/L (ref 3.5–5.1)
Sodium: 125 mmol/L — ABNORMAL LOW (ref 135–145)

## 2021-04-05 MED ORDER — UREA 15 G PO PACK
15.0000 g | PACK | Freq: Two times a day (BID) | ORAL | Status: DC
  Administered 2021-04-05 – 2021-04-06 (×4): 15 g via ORAL
  Filled 2021-04-05 (×5): qty 1

## 2021-04-05 MED ORDER — SODIUM CHLORIDE 0.9 % IV SOLN: INTRAVENOUS | Status: DC

## 2021-04-05 NOTE — Progress Notes (Signed)
Occupational Therapy Treatment Patient Details Name: [name] MRN: [id number] DOB: November 04, 1955 Today's Date: 04/05/2021   History of present illness Pt presented to ED on 9/12 with etoh abuse with withdrawals and frequent falls at home. Pt also with hyponatremia. Remains hospitalized due to unsafe d/c plan. 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   OT comments  Patient up in recliner when therapist arrived. Patient donned non-skid socks seated in recliner with setup. Patient ambulated to sink with RW with frequent cues to stay close to walker. At sink patient was able to use BUE to perform grooming tasks. Returning to recliner patient required physical assistance to reach back to sit. Chair push up performed from recliner to increase training for reaching back to sit. Acute OT to continue to follow.    Recommendations for follow up therapy are one component of a multi-disciplinary discharge planning process, led by the attending physician.  Recommendations may be updated based on patient status, additional functional criteria and insurance authorization.    Follow Up Recommendations  SNF;Supervision/Assistance - 24 hour    Equipment Recommendations  3 in 1 bedside commode    Recommendations for Other Services      Precautions / Restrictions Precautions Precautions: Fall       Mobility Bed Mobility               General bed mobility comments: up in chair upon OT arrival    Transfers Overall transfer level: Needs assistance Equipment used: Rolling walker (2 wheeled) Transfers: Sit to/from Stand Sit to Stand: Min guard;Min assist         General transfer comment: requires assistance to ensure reaching back    Balance Overall balance assessment: Needs assistance Sitting-balance support: No upper extremity supported;Feet supported Sitting balance-Leahy Scale: Good     Standing balance support: No upper extremity supported;During functional  activity Standing balance-Leahy Scale: Poor Standing balance comment: able to perform grooming standing at sink unsupported                           ADL either performed or assessed with clinical judgement   ADL       Grooming: Wash/dry hands;Brushing hair;Wash/dry face;Standing Grooming Details (indicate cue type and reason): supervision while stand at sink             Lower Body Dressing: Set up;Sitting/lateral leans Lower Body Dressing Details (indicate cue type and reason): setup to don non-skid socks             Functional mobility during ADLs: Min guard;Rolling walker General ADL Comments: min guard during mobility due to unsafe habits     Vision       Perception     Praxis      Cognition Arousal/Alertness: Awake/alert Behavior During Therapy: WFL for tasks assessed/performed Overall Cognitive Status: Impaired/Different from baseline Area of Impairment: Attention;Following commands;Safety/judgement;Awareness;Problem solving;Memory                   Current Attention Level: Selective Memory: Decreased short-term memory Following Commands: Follows one step commands with increased time;Follows one step commands consistently Safety/Judgement: Decreased awareness of safety;Decreased awareness of deficits Awareness: Emergent Problem Solving: Slow processing;Difficulty sequencing;Requires verbal cues;Requires tactile cues General Comments: requires frequent cues for safety; reach back to sit, stay close to rw        Exercises Exercises: General Upper Extremity General Exercises - Upper Extremity Chair Push Up: 10 reps  Shoulder Instructions       General Comments      Pertinent Vitals/ Pain          Home Living                                          Prior Functioning/Environment              Frequency  Min 2X/week        Progress Toward Goals  OT Goals(current goals can now be found in the  care plan section)  Progress towards OT goals: Progressing toward goals  Acute Rehab OT Goals Patient Stated Goal: to get stronger and get out of here OT Goal Formulation: With patient Time For Goal Achievement: 04/06/21 Potential to Achieve Goals: Good ADL Goals Pt Will Perform Grooming: standing;with supervision Pt Will Perform Lower Body Dressing: with supervision;sit to/from stand Pt Will Transfer to Toilet: with supervision;ambulating Additional ADL Goal #1: Pt will demonstrate anticipatory awareness for safe completion of ADL/IADL.  Plan Discharge plan remains appropriate    Co-evaluation                 AM-PAC OT "6 Clicks" Daily Activity     Outcome Measure   Help from another person eating meals?: A Little Help from another person taking care of personal grooming?: A Little Help from another person toileting, which includes using toliet, bedpan, or urinal?: A Little Help from another person bathing (including washing, rinsing, drying)?: A Little Help from another person to put on and taking off regular upper body clothing?: A Little Help from another person to put on and taking off regular lower body clothing?: A Little 6 Click Score: 18    End of Session Equipment Utilized During Treatment: Gait belt;Rolling walker  OT Visit Diagnosis: Unsteadiness on feet (R26.81);History of falling (Z91.81);Muscle weakness (generalized) (M62.81)   Activity Tolerance Patient tolerated treatment well   Patient Left in chair;with call bell/phone within reach;with chair alarm set   Nurse Communication Mobility status        Time: 0865-7846 OT Time Calculation (min): 23 min  Charges: OT General Charges $OT Visit: 1 Visit OT Treatments $Self Care/Home Management : 8-22 mins $Therapeutic Exercise: 8-22 mins  Lodema Hong, OTA   Trixie Dredge 04/05/2021, 2:36 PM

## 2021-04-05 NOTE — Progress Notes (Signed)
Progress Note    [name]  ZLD:[id number] DOB: 1956-04-02  DOA: 03/21/2021 PCP: Ann Held, DO    Brief Narrative:     Medical records reviewed and are as summarized below:  [name] is an 65 y.o. female with medical history significant of hypertension, alcohol abuse, anxiety, and depression presents with what she reports is an alcohol problem.  Admits to drinking 4-5 beers per day on average and intermittently drinking wine for over 30 years.  Her last drink was 2 days ago.  Her friend Daine Floras was able to provide additional history over the phone. Her friend tried to take her to [facility] in [place] for alcohol rehab, but she was declined admission as she was significantly debilitated needing assistance, in the ER she was found to be in early DTs, had ataxia and difficulty in standing up and ambulating.  Her stay in the hospital has been complicated by hyponatremia.     Assessment/Plan:   Principal Problem:   Alcohol withdrawal ([place]) Active Problems:   Essential hypertension   Hyponatremia   Anxiety and depression   Gait disturbance   Falls frequently   Alcohol abuse   Alcohol abuse with early DTs.- Counseled to quit alcohol: this problem has resolved.   Multiple falls with some ataxia.  MRI brain nonacute however limited due to motion artifact, continue PT OT, continue replacement of thiamine and B12, B12 levels were borderline, thiamine levels were added to blood work however she has received some supplemental thiamine by that time, thiamine levels ok  Could have early Wernicke's encephalopathy   She is extremely weak, lives alone and will require SNF, continue PT OT, gradual improvement in her balance and strength.    Hypomagnesemia, Hypokalemia .   -replace K &  Mag as needed  hyponatremia -clinically euvolemic  -single dose Samsca on 03/26/21 with 125>>129, dropped again 03/28/21 when Samsca was held, Na  improving again after Samsca dose on 03/28/2021, repeated Samsca again on 03/29/2021. -fluid restricted -nephrology consult appreciated -started on IVF and salt tabs 9/25 with improvement of Na to 128----> back down to 125 after IVF stopped   Hypertension.  Blood pressure medications adjusted   possible underlying alcoholic cirrhosis.  Ultrasound noted.  stable INR and LFTs.  Outpatient GI follow-up  Urinary retention -I/o x 1 -U/A shows WBCs but patient is not c/o dysuria, fever, chills-- wil hold on treatment  Family Communication/Anticipated D/C date and plan/Code Status   DVT prophylaxis: Lovenox ordered. Code Status: Full Code.  Disposition Plan: Status is: Inpatient  Remains inpatient appropriate because:Inpatient level of care appropriate due to severity of illness  Dispo: The patient is from: Home              Anticipated d/c is to: SNF   Difficult to place patient No         Medical Consultants:   Nephrology     Subjective:   Wants to go home-- says she is setting up personal caregivers for home   Objective:    Vitals:   04/04/21 1950 04/05/21 0050 04/05/21 0759 04/05/21 1209  BP: (!) 140/92 110/74 (!) 146/94 127/81  Pulse: 80 64 74 75  Resp: 18 18 16 17   Temp: 98.9 F (37.2 C) 98.5 F (36.9 C) 97.8 F (36.6 C) 98.1 F (36.7 C)  TempSrc: Oral Oral Oral Tympanic  SpO2: 100% 98% 100% 100%  Weight:      Height:  Intake/Output Summary (Last 24 hours) at 04/05/2021 1353 Last data filed at 04/05/2021 0800 Gross per 24 hour  Intake 357 ml  Output 2730 ml  Net -2373 ml   Filed Weights   03/22/21 2344 03/27/21 0315 03/28/21 0400  Weight: 47.5 kg 47.7 kg 50 kg    Exam:   General: Appearance:    Chronically ill appearing female in no acute distress, up in chair     Lungs:     respirations unlabored  Heart:    Normal heart rate. Normal rhythm. No murmurs, rubs, or gallops.    MS:   All extremities are intact.    Neurologic:   Awake,  alert, memory is impaired          Data Reviewed:   I have personally reviewed following labs and imaging studies:  Labs: Labs show the following:   Basic Metabolic Panel: Recent Labs  Lab 04/02/21 0701 04/02/21 1714 04/03/21 0117 04/04/21 0135 04/05/21 0323  NA 124* 127* 128* 128* 125*  K 3.6 3.9 3.9 3.3* 4.1  CL 93* 99 98 96* 97*  CO2 21* 18* 21* 21* 19*  GLUCOSE 96 104* 96 99 87  BUN 13 12 9 11 13   CREATININE 0.62 0.75 0.62 0.78 0.73  CALCIUM 9.3 8.8* 8.7* 9.2 9.2  PHOS 3.7 2.9 3.0 3.2 3.5   GFR Estimated Creatinine Clearance: 51 mL/min (by C-G formula based on SCr of 0.73 mg/dL). Liver Function Tests: Recent Labs  Lab 04/02/21 0701 04/02/21 1714 04/03/21 0117 04/04/21 0135 04/05/21 0323  ALBUMIN 3.2* 3.1* 3.0* 3.2* 3.1*   No results for input(s): LIPASE, AMYLASE in the last 168 hours. No results for input(s): AMMONIA in the last 168 hours. Coagulation profile No results for input(s): INR, PROTIME in the last 168 hours.  CBC: No results for input(s): WBC, NEUTROABS, HGB, HCT, MCV, PLT in the last 168 hours.  Cardiac Enzymes: No results for input(s): CKTOTAL, CKMB, CKMBINDEX, TROPONINI in the last 168 hours. BNP (last 3 results) No results for input(s): PROBNP in the last 8760 hours. CBG: No results for input(s): GLUCAP in the last 168 hours. D-Dimer: No results for input(s): DDIMER in the last 72 hours. Hgb A1c: No results for input(s): HGBA1C in the last 72 hours. Lipid Profile: No results for input(s): CHOL, HDL, LDLCALC, TRIG, CHOLHDL, LDLDIRECT in the last 72 hours. Thyroid function studies: No results for input(s): TSH, T4TOTAL, T3FREE, THYROIDAB in the last 72 hours.  Invalid input(s): FREET3 Anemia work up: No results for input(s): VITAMINB12, FOLATE, FERRITIN, TIBC, IRON, RETICCTPCT in the last 72 hours. Sepsis Labs: No results for input(s): PROCALCITON, WBC, LATICACIDVEN in the last 168 hours.   Microbiology No results found for  this or any previous visit (from the past 240 hour(s)).   Procedures and diagnostic studies:  No results found.  Medications:    acidophilus  1 capsule Oral Daily   enoxaparin (LOVENOX) injection  40 mg Subcutaneous D92E   folic acid  1 mg Oral Daily   lisinopril  40 mg Oral Daily   metoprolol succinate  100 mg Oral Daily   multivitamin with minerals  1 tablet Oral Daily   pantoprazole  40 mg Oral Daily   sodium chloride flush  3 mL Intravenous Q12H   sodium chloride  2 g Oral TID WC   vitamin B-12  1,000 mcg Oral BID   Continuous Infusions:  sodium chloride 100 mL/hr at 04/05/21 0758       LOS: 14 days  Geradine Girt  [facility]   How to contact the [facility] Attending or Consulting provider [place] or covering provider during after hours [place], for this patient?  Check the care team in [facility] and look for a) attending/consulting [facility] provider listed and b) the [facility] team listed Log into [email or web address] and use [place]'s universal password to access. If you do not have the password, please contact the hospital operator. Locate the [facility] provider you are looking for under [facility] and page to a number that you can be directly reached. If you still have difficulty reaching the provider, please page the [facility] (Director on Call) for the Hospitalists listed on amion for assistance.  04/05/2021, 1:53 PM

## 2021-04-05 NOTE — TOC Progression Note (Addendum)
Transition of Care [facility]) - Progression Note    Patient Details  Name: [name] MRN: [id number] Date of Birth: Jul 02, 1956  [facility] CM/SW Contact  Carles Collet, RN Phone Number: 04/05/2021, 4:54 PM  Clinical Narrative:    Caryl Ada in case management at [facility]. He states that the provider courtesy review filed on 9/20 has not resulted and we can expect it to by tomorrow morning. Results will be faxed to [facility] and [place], but can also be obtained by calling [phone]. Reference for case is [id number]  If review upholds the original denial, the next step is a Level 1 review.  A level 1 review will take up to 30 days. We can request for the level 1 review to be expedited when we file it, the request does not guarantee it will be expedited but that they will consider it for expedition.  Form obtained from [email or web address] <providers < commercial product providers <appeals process <  Level I Provider Appeal Form link, and faxed to [phone] with pertinent medical records.     Expected Discharge Plan: [place] Barriers to Discharge: Continued Medical Work up  Expected Discharge Plan and Services Expected Discharge Plan: [place] In-house Referral: Clinical Social Work Discharge Planning Services: CM Consult   Living arrangements for the past 2 months: Apartment                                       Social Determinants of Health (SDOH) Interventions    Readmission Risk Interventions No flowsheet data found.

## 2021-04-05 NOTE — Progress Notes (Signed)
Physical Therapy Treatment Patient Details Name: [name] MRN: [id number] DOB: July 01, 1956 Today's Date: 04/05/2021   History of Present Illness Pt presented to ED on 9/12 with etoh abuse with withdrawals and frequent falls at home. Pt also with hyponatremia. Remains hospitalized due to unsafe d/c plan. 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    PT Comments    Patient remains highly motivated as her goal is to return home. States she plans to hire an aide to assist her "at times." Continued poor awareness of her deficits as she needs min assist for sit to stand (due to posterior lean) and up to mod assist for ambulation with very slow, short steps.     Recommendations for follow up therapy are one component of a multi-disciplinary discharge planning process, led by the attending physician.  Recommendations may be updated based on patient status, additional functional criteria and insurance authorization.  Follow Up Recommendations  SNF     Equipment Recommendations  Rolling walker with 5" wheels    Recommendations for Other Services       Precautions / Restrictions Precautions Precautions: Fall     Mobility  Bed Mobility                    Transfers Overall transfer level: Needs assistance Equipment used: Rolling walker (2 wheeled);None Transfers: Sit to/from Stand Sit to Stand: Min guard;Min assist         General transfer comment: Min A  with cues for hand placement. Without RW has posterior bias and requires steadying assist to gain her balance before returning to sit. Stood from chair x10.  Ambulation/Gait Ambulation/Gait assistance: Mod assist;Min assist Gait Distance (Feet): 50 Feet (standing rest; 25, rest, 25) Assistive device: IV Pole Gait Pattern/deviations: Step-through pattern;Wide base of support;Trunk flexed;Step-to pattern Gait velocity: decr   General Gait Details: Slow, waddling-like step-to gait pattern requiring  Min-Mod A for support; cues to increase step lengths and for forward momentum. Stiff throughout with decreased knee flexion during swing phase. Reaching for UE support with hand not on IV pole and finally agreed that she is more safe with RW (she did not want to use).   Stairs             Wheelchair Mobility    Modified Rankin (Stroke Patients Only)       Balance Overall balance assessment: Needs assistance Sitting-balance support: No upper extremity supported;Feet supported Sitting balance-Leahy Scale: Good     Standing balance support: During functional activity Standing balance-Leahy Scale: Poor Standing balance comment: requires assist due to posterior lean                            Cognition Arousal/Alertness: Awake/alert Behavior During Therapy: WFL for tasks assessed/performed Overall Cognitive Status: Impaired/Different from baseline Area of Impairment: Attention;Following commands;Safety/judgement;Awareness;Problem solving;Memory                   Current Attention Level: Selective Memory: Decreased short-term memory Following Commands: Follows one step commands with increased time;Follows one step commands consistently Safety/Judgement: Decreased awareness of safety;Decreased awareness of deficits Awareness: Emergent Problem Solving: Slow processing;Difficulty sequencing;Requires verbal cues;Requires tactile cues General Comments: Seems improved today, however feels she is safe to go home and hire an aide to come in and help her "sometimes"      Exercises      General Comments        Pertinent Vitals/Pain  Home Living                      Prior Function            PT Goals (current goals can now be found in the care plan section) Acute Rehab PT Goals Patient Stated Goal: to get stronger and get out of here Time For Goal Achievement: 04/17/21 Potential to Achieve Goals: Good Progress towards PT goals:  Progressing toward goals    Frequency    Min 3X/week      PT Plan Current plan remains appropriate    Co-evaluation              AM-PAC PT "6 Clicks" Mobility   Outcome Measure  Help needed turning from your back to your side while in a flat bed without using bedrails?: A Little Help needed moving from lying on your back to sitting on the side of a flat bed without using bedrails?: A Little Help needed moving to and from a bed to a chair (including a wheelchair)?: A Little Help needed standing up from a chair using your arms (e.g., wheelchair or bedside chair)?: A Little Help needed to walk in hospital room?: A Lot Help needed climbing 3-5 steps with a railing? : A Lot 6 Click Score: 16    End of Session Equipment Utilized During Treatment: Gait belt Activity Tolerance: Patient tolerated treatment well Patient left: in chair;with call bell/phone within reach   PT Visit Diagnosis: Unsteadiness on feet (R26.81);Repeated falls (R29.6);Ataxic gait (R26.0)     Time: 7619-5093 PT Time Calculation (min) (ACUTE ONLY): 19 min  Charges:  $Gait Training: 8-22 mins                      Arby Barrette, PT Pager [phone]    Rexanne Mano 04/05/2021, 1:18 PM

## 2021-04-05 NOTE — Progress Notes (Addendum)
  [facility] Progress Note   24-year lady with a history of depression anxiety alcohol abuse and hypertension.  She was to go to detox in [place] but was in early DT's and declined admssion. Upon presentation to the ED she   was found to be profoundly hyponatremic.  Urine osmolality 362 urine sodium 122 serum osmolality 277 sodium 129.  She received a one-time dose of tolvaptan 03/26/2021 with an increase in his sodium to 129.  Then dropped to 122.  Sodium   levels have then fluctuated.   Assessment/ Plan:   Hyponatremia - Urine Osmolality only a little less than serum osmolality and the fact that the patient's Na incr with isotonic fluids argues against SIADH. NS administration is actually increasing the solute excretion which is why the SNa increased. - Continue salt tablets and will add Ure-Na as well to increase solute - Encourage PO intake - Will also check a 24hr urine and urine urea which will confirm the patient has adequate caloric intake. - Will hold off on a further  dose of Samsca at this time but may have to start tomorrow if downward trend.  Subjective:   Feels better and denies dizziness, SOB, decreased UOP or feeling thirsty.   Objective:   BP 127/81 (BP Location: Right Arm)   Pulse 75   Temp 98.1 F (36.7 C) (Tympanic)   Resp 17   Ht 5' (1.524 m)   Wt 50 kg   SpO2 100%   BMI 21.53 kg/m   Intake/Output Summary (Last 24 hours) at 04/05/2021 1422 Last data filed at 04/05/2021 0800 Gross per 24 hour  Intake 357 ml  Output 2730 ml  Net -2373 ml   Weight change:   Physical Exam: General appearance: alert, cooperative, cachectic, Head: NCAT Resp: CTA b/l Cardio: RRR GI: SNDNT + BS Extremities: no edema  Imaging: No results found.  Labs: BMET Recent Labs  Lab 03/31/21 0146 04/01/21 0103 04/02/21 0701 04/02/21 1714 04/03/21 0117 04/04/21 0135 04/05/21 0323  NA 126* 126* 124* 127* 128* 128* 125*  K 3.9 3.8 3.6 3.9 3.9 3.3* 4.1  CL 94* 94* 93*  99 98 96* 97*  CO2 23 22 21* 18* 21* 21* 19*  GLUCOSE 102* 91 96 104* 96 99 87  BUN 12 10 13 12 9 11 13   CREATININE 0.76 0.69 0.62 0.75 0.62 0.78 0.73  CALCIUM 9.2 9.1 9.3 8.8* 8.7* 9.2 9.2  PHOS  --   --  3.7 2.9 3.0 3.2 3.5   CBC No results for input(s): WBC, NEUTROABS, HGB, HCT, MCV, PLT in the last 168 hours.  Medications:     acidophilus  1 capsule Oral Daily   enoxaparin (LOVENOX) injection  40 mg Subcutaneous Z73V   folic acid  1 mg Oral Daily   lisinopril  40 mg Oral Daily   metoprolol succinate  100 mg Oral Daily   multivitamin with minerals  1 tablet Oral Daily   pantoprazole  40 mg Oral Daily   sodium chloride flush  3 mL Intravenous Q12H   sodium chloride  2 g Oral TID WC   vitamin B-12  1,000 mcg Oral BID      Otelia Santee, MD 04/05/2021, 2:22 PM

## 2021-04-05 NOTE — Plan of Care (Signed)
  Problem: Education: Goal: Knowledge of General Education information will improve Description Including pain rating scale, medication(s)/side effects and non-pharmacologic comfort measures Outcome: Progressing   Problem: Health Behavior/Discharge Planning: Goal: Ability to manage health-related needs will improve Outcome: Progressing   

## 2021-04-06 DIAGNOSIS — F419 Anxiety disorder, unspecified: Secondary | ICD-10-CM | POA: Diagnosis not present

## 2021-04-06 DIAGNOSIS — I10 Essential (primary) hypertension: Secondary | ICD-10-CM | POA: Diagnosis not present

## 2021-04-06 DIAGNOSIS — F10230 Alcohol dependence with withdrawal, uncomplicated: Secondary | ICD-10-CM | POA: Diagnosis not present

## 2021-04-06 DIAGNOSIS — F1010 Alcohol abuse, uncomplicated: Secondary | ICD-10-CM | POA: Diagnosis not present

## 2021-04-06 LAB — RENAL FUNCTION PANEL
Albumin: 3.1 g/dL — ABNORMAL LOW (ref 3.5–5.0)
Anion gap: 7 (ref 5–15)
BUN: 40 mg/dL — ABNORMAL HIGH (ref 8–23)
CO2: 22 mmol/L (ref 22–32)
Calcium: 9 mg/dL (ref 8.9–10.3)
Chloride: 98 mmol/L (ref 98–111)
Creatinine, Ser: 0.7 mg/dL (ref 0.44–1.00)
GFR, Estimated: >60 mL/min (ref >60)
Glucose, Bld: 96 mg/dL (ref 70–99)
Phosphorus: 4 mg/dL (ref 2.5–4.6)
Potassium: 3.5 mmol/L (ref 3.5–5.1)
Sodium: 127 mmol/L — ABNORMAL LOW (ref 135–145)

## 2021-04-06 LAB — CREATININE, URINE, 24 HOUR
Collection Interval-UCRE24: 24 hours
Creatinine, 24H Ur: 597 mg/d — ABNORMAL LOW (ref 600–1800)
Creatinine, Urine: 20.59 mg/dL
Urine Total Volume-UCRE24: 2900 mL

## 2021-04-06 NOTE — Progress Notes (Signed)
  [facility] Progress Note   52-year lady with a history of depression anxiety alcohol abuse and hypertension.  She was to go to detox in [place] but was in early DT's and declined admssion. Upon presentation to the ED she   was found to be profoundly hyponatremic.  Urine osmolality 362 urine sodium 122 serum osmolality 277 sodium 129.  She received a one-time dose of tolvaptan 03/26/2021 with an increase in his sodium to 129.  Then dropped to 122.  Sodium   levels have then fluctuated.   Assessment/ Plan:   Hyponatremia - Urine Osmolality only a little less than serum osmolality and the fact that the patient's Na incr with isotonic fluids argues against SIADH. NS administration is actually increasing the solute excretion which is why the SNa increased. - Continue salt tablets and added  Ure-Na on 9/27 (1 dose given on 9/27) as well to increase solute - Encourage PO intake - Will also check a 24hr urine and urine urea which will confirm the patient has adequate caloric intake. - Further  dose of Samsca not indicated at this time.  Subjective:   Feels better and denies dizziness, SOB, decreased UOP or feeling thirsty.   Objective:   BP 134/87 (BP Location: Right Arm)   Pulse 80   Temp 97.7 F (36.5 C) (Oral)   Resp 18   Ht 5' (1.524 m)   Wt 50 kg   SpO2 100%   BMI 21.53 kg/m   Intake/Output Summary (Last 24 hours) at 04/06/2021 3825 Last data filed at 04/06/2021 0430 Gross per 24 hour  Intake 1425.77 ml  Output 2939 ml  Net -1513.23 ml   Weight change:   Physical Exam: General appearance: alert, cooperative, cachectic, Head: NCAT Resp: CTA b/l Cardio: RRR GI: SNDNT + BS Extremities: no edema  Imaging: No results found.  Labs: BMET Recent Labs  Lab 04/01/21 0103 04/02/21 0701 04/02/21 1714 04/03/21 0117 04/04/21 0135 04/05/21 0323 04/06/21 0142  NA 126* 124* 127* 128* 128* 125* 127*  K 3.8 3.6 3.9 3.9 3.3* 4.1 3.5  CL 94* 93* 99 98 96* 97* 98  CO2 22  21* 18* 21* 21* 19* 22  GLUCOSE 91 96 104* 96 99 87 96  BUN 10 13 12 9 11 13  40*  CREATININE 0.69 0.62 0.75 0.62 0.78 0.73 0.70  CALCIUM 9.1 9.3 8.8* 8.7* 9.2 9.2 9.0  PHOS  --  3.7 2.9 3.0 3.2 3.5 4.0   CBC No results for input(s): WBC, NEUTROABS, HGB, HCT, MCV, PLT in the last 168 hours.  Medications:     acidophilus  1 capsule Oral Daily   enoxaparin (LOVENOX) injection  40 mg Subcutaneous [id number]   folic acid  1 mg Oral Daily   lisinopril  40 mg Oral Daily   metoprolol succinate  100 mg Oral Daily   multivitamin with minerals  1 tablet Oral Daily   pantoprazole  40 mg Oral Daily   sodium chloride flush  3 mL Intravenous Q12H   sodium chloride  2 g Oral TID WC   urea  15 g Oral BID   vitamin B-12  1,000 mcg Oral BID      Otelia Santee, MD 04/06/2021, 7:33 AM

## 2021-04-06 NOTE — Progress Notes (Signed)
Bladder scan revealed 44mL. Patient able to void 379mL spontaneously. MD aware and recommends continue bladder scan and monitoring.

## 2021-04-06 NOTE — Progress Notes (Signed)
Physical Therapy Treatment Patient Details Name: [name] MRN: [id number] DOB: 04/06/1956 Today's Date: 04/06/2021   History of Present Illness Pt presented to ED on 9/12 with etoh abuse with withdrawals and frequent falls at home. Pt also with hyponatremia. Remains hospitalized due to unsafe d/c plan. 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    PT Comments    Pt tolerates treatment well, ambulating for increased distances. Pt continues to demonstrate significant balance deficits at this time and benefits from BUE support of a walker or railing to reduce falls risk. Pt has poor awareness of deficits, with impaired recognition of posterior lean. Pt asking if she can go home despite multiple losses of balance requiring PT assistance to correct. PT continues to recommend SNF placement at this time. Pt will benefit from frequent bouts of ambulation with assistance of staff, pt currently reporting the only time she walks is with therapy.   Recommendations for follow up therapy are one component of a multi-disciplinary discharge planning process, led by the attending physician.  Recommendations may be updated based on patient status, additional functional criteria and insurance authorization.  Follow Up Recommendations  SNF     Equipment Recommendations  Rolling walker with 5" wheels    Recommendations for Other Services       Precautions / Restrictions Precautions Precautions: Fall Restrictions Weight Bearing Restrictions: No     Mobility  Bed Mobility Overal bed mobility: Needs Assistance Bed Mobility: Supine to Sit     Supine to sit: Supervision;HOB elevated          Transfers Overall transfer level: Needs assistance Equipment used: Rolling walker (2 wheeled) Transfers: Sit to/from Stand Sit to Stand: Supervision         General transfer comment: cues for trunk flexion and foot placement. During 5 x sit to stand pt with trunk extension and  posterior lean, RLE sliding forward from underneath trunk  Ambulation/Gait Ambulation/Gait assistance: Min guard;Mod assist Gait Distance (Feet): 80 Feet (additional trial of 25' without walker, needing railing to maintain balance) Assistive device: Rolling walker (2 wheeled) Gait Pattern/deviations: Step-through pattern;Wide base of support Gait velocity: reduced Gait velocity interpretation: <1.8 ft/sec, indicate of risk for recurrent falls General Gait Details: pt with slowed step-through gait, increased lateral pelvic displacement with each step. Without UE support of walker pt requiring PT support due to increased lateral sway, multiple instances of knee buckling. Pt reaching for BUE support with all activity.   Stairs             Wheelchair Mobility    Modified Rankin (Stroke Patients Only)       Balance Overall balance assessment: Needs assistance Sitting-balance support: No upper extremity supported;Feet supported Sitting balance-Leahy Scale: Good     Standing balance support: Single extremity supported;Bilateral upper extremity supported Standing balance-Leahy Scale: Poor Standing balance comment: reliant on UE support, requiring physical assistance when without BUE support                            Cognition Arousal/Alertness: Awake/alert Behavior During Therapy: WFL for tasks assessed/performed Overall Cognitive Status: Impaired/Different from baseline Area of Impairment: Awareness;Safety/judgement                         Safety/Judgement: Decreased awareness of deficits;Decreased awareness of safety Awareness: Emergent          Exercises      General Comments General  comments (skin integrity, edema, etc.): VSS on RA      Pertinent Vitals/Pain Pain Assessment: No/denies pain    Home Living                      Prior Function            PT Goals (current goals can now be found in the care plan section) Acute  Rehab PT Goals Patient Stated Goal: to get stronger and get out of here Progress towards PT goals: Progressing toward goals    Frequency    Min 3X/week      PT Plan Current plan remains appropriate    Co-evaluation              AM-PAC PT "6 Clicks" Mobility   Outcome Measure  Help needed turning from your back to your side while in a flat bed without using bedrails?: A Little Help needed moving from lying on your back to sitting on the side of a flat bed without using bedrails?: A Little Help needed moving to and from a bed to a chair (including a wheelchair)?: A Little Help needed standing up from a chair using your arms (e.g., wheelchair or bedside chair)?: A Little Help needed to walk in hospital room?: A Lot Help needed climbing 3-5 steps with a railing? : A Lot 6 Click Score: 16    End of Session   Activity Tolerance: Patient tolerated treatment well Patient left: in chair;with call bell/phone within reach;with chair alarm set Nurse Communication: Mobility status PT Visit Diagnosis: Unsteadiness on feet (R26.81);Repeated falls (R29.6);Ataxic gait (R26.0)     Time: 9147-8295 PT Time Calculation (min) (ACUTE ONLY): 23 min  Charges:  $Gait Training: 8-22 mins $Therapeutic Activity: 8-22 mins                     [name], PT, DPT Acute Rehabilitation Pager: [phone]    [name] 04/06/2021, 4:32 PM

## 2021-04-06 NOTE — Progress Notes (Signed)
Progress Note    [name]  SAY:[id number] DOB: 02-10-1956  DOA: 03/21/2021 PCP: Ann Held, DO   Brief Narrative:   [name] is an 65 y.o. female with medical history significant of hypertension, alcohol abuse, anxiety, and depression presented to the hospital with alcohol abuse.  In the ED, patient was noted to have early delirium tremens and ataxia, difficulty ambulating and standing up and was admitted to hospital for alcohol withdrawal syndrome, alcohol abuse.  Her hospital stay was complicated by hyponatremia.   Assessment/Plan:   Principal Problem:   Alcohol withdrawal ([place]) Active Problems:   Essential hypertension   Hyponatremia   Anxiety and depression   Gait disturbance   Falls frequently   Alcohol abuse  Alcohol abuse with early DTs.-Counseling was done.  No active withdrawal symptoms noted at this time.   Multiple falls with ataxia.  MRI of the brain was nonacute.  Patient had received vitamin B12 levels including thiamine.  Thiamine levels okay at this time.  There was some concern for early Warnicke encephalopathy.  Continues to be weak, lives alone by herself and will require skilled nursing facility placement.  Physical therapy and Occupational Therapy on board     Hypomagnesemia, Hypokalemia .   Has been replenished.  Check levels in a.m.  hyponatremia Nephrology was consulted due to hyponatremia.  Patient received a few doses of Samsca during hospitalization.  Currently fluid restricted.  Nephrology on board.  On IV fluids and urea tablets as per nephrology.  On sodium tablets as well.  Essential hypertension.  Continue lisinopril   Alcoholic cirrhosis.  Recommend outpatient follow-up.  Urinary retention Received in and out cath during hospitalization.  DVT prophylaxis: Lovenox subcu  Code Status: Full Code.   Disposition Plan:  Status is: Inpatient  Remains inpatient appropriate because:Inpatient level of care appropriate  due to severity of illness  Dispo: The patient is from: Home              Anticipated d/c is to: SNF   Difficult to place patient No  Medical Consultants:   Nephrology  Subjective:   Today, patient was seen and examined at bedside.  Patient denies any tremors, hallucinations, dizziness, lightheadedness or shortness of breath Objective:    Vitals:   04/05/21 2325 04/06/21 0340 04/06/21 0753 04/06/21 1201  BP: 136/86 134/87 (!) 125/93 (!) 140/91  Pulse: 78 80 60 74  Resp: 18 18 15 19   Temp: (!) 97.3 F (36.3 C) 97.7 F (36.5 C) 98.4 F (36.9 C) 98.2 F (36.8 C)  TempSrc: Oral Oral Oral Oral  SpO2: 92% 100% 95% 100%  Weight:      Height:        Intake/Output Summary (Last 24 hours) at 04/06/2021 1318 Last data filed at 04/06/2021 1202 Gross per 24 hour  Intake 3485.39 ml  Output 3589 ml  Net -103.61 ml    Filed Weights   03/22/21 2344 03/27/21 0315 03/28/21 0400  Weight: 47.5 kg 47.7 kg 50 kg    Physical exam:  General: Chronically ill,, not in obvious distress HENT:   No scleral pallor or icterus noted. Oral mucosa is moist.  Chest:  Clear breath sounds.  Diminished breath sounds bilaterally. No crackles or wheezes.  CVS: S1 &S2 heard. No murmur.  Regular rate and rhythm. Abdomen: Soft, nontender, nondistended.  Bowel sounds are heard.   Extremities: No cyanosis, clubbing or edema.  Peripheral pulses are palpable. Psych: Alert, awake and communicative, CNS:  No cranial nerve deficits.  Power equal in all extremities.  Impaired memory Skin: Warm and dry.  No rashes noted.     Data Reviewed:   I I have personally reviewed the following labs and imaging studies   Labs:  Basic Metabolic Panel: Recent Labs  Lab 04/02/21 1714 04/03/21 0117 04/04/21 0135 04/05/21 0323 04/06/21 0142  NA 127* 128* 128* 125* 127*  K 3.9 3.9 3.3* 4.1 3.5  CL 99 98 96* 97* 98  CO2 18* 21* 21* 19* 22  GLUCOSE 104* 96 99 87 96  BUN 12 9 11 13  40*  CREATININE 0.75 0.62 0.78  0.73 0.70  CALCIUM 8.8* 8.7* 9.2 9.2 9.0  PHOS 2.9 3.0 3.2 3.5 4.0    GFR Estimated Creatinine Clearance: 51 mL/min (by C-G formula based on SCr of 0.7 mg/dL). Liver Function Tests: Recent Labs  Lab 04/02/21 1714 04/03/21 0117 04/04/21 0135 04/05/21 0323 04/06/21 0142  ALBUMIN 3.1* 3.0* 3.2* 3.1* 3.1*    No results for input(s): LIPASE, AMYLASE in the last 168 hours. No results for input(s): AMMONIA in the last 168 hours. Coagulation profile No results for input(s): INR, PROTIME in the last 168 hours.  CBC: No results for input(s): WBC, NEUTROABS, HGB, HCT, MCV, PLT in the last 168 hours.  Cardiac Enzymes: No results for input(s): CKTOTAL, CKMB, CKMBINDEX, TROPONINI in the last 168 hours. BNP (last 3 results) No results for input(s): PROBNP in the last 8760 hours. CBG: No results for input(s): GLUCAP in the last 168 hours. D-Dimer: No results for input(s): DDIMER in the last 72 hours. Hgb A1c: No results for input(s): HGBA1C in the last 72 hours. Lipid Profile: No results for input(s): CHOL, HDL, LDLCALC, TRIG, CHOLHDL, LDLDIRECT in the last 72 hours. Thyroid function studies: No results for input(s): TSH, T4TOTAL, T3FREE, THYROIDAB in the last 72 hours.  Invalid input(s): FREET3 Anemia work up: No results for input(s): VITAMINB12, FOLATE, FERRITIN, TIBC, IRON, RETICCTPCT in the last 72 hours. Sepsis Labs: No results for input(s): PROCALCITON, WBC, LATICACIDVEN in the last 168 hours.   Microbiology No results found for this or any previous visit (from the past 240 hour(s)).   Procedures and diagnostic studies:  No results found.  Medications:    acidophilus  1 capsule Oral Daily   enoxaparin (LOVENOX) injection  40 mg Subcutaneous Z60F   folic acid  1 mg Oral Daily   lisinopril  40 mg Oral Daily   metoprolol succinate  100 mg Oral Daily   multivitamin with minerals  1 tablet Oral Daily   pantoprazole  40 mg Oral Daily   sodium chloride flush  3 mL  Intravenous Q12H   sodium chloride  2 g Oral TID WC   urea  15 g Oral BID   vitamin B-12  1,000 mcg Oral BID   Continuous Infusions:  sodium chloride 100 mL/hr at 04/06/21 1147      LOS: 15 days   [facility], MD 04/06/2021, 1:18 PM

## 2021-04-06 NOTE — Progress Notes (Signed)
HOSPITAL MEDICINE OVERNIGHT EVENT NOTE    Notified by nursing that postvoid residual bladder scan has revealed 337 cc in the bladder.  This is similar to postvoid residual findings on previous evenings.  Intermittent catheterization ordered.  Vernelle Emerald  MD [facility]

## 2021-04-07 ENCOUNTER — Other Ambulatory Visit (HOSPITAL_COMMUNITY): Payer: Self-pay

## 2021-04-07 DIAGNOSIS — F10230 Alcohol dependence with withdrawal, uncomplicated: Secondary | ICD-10-CM | POA: Diagnosis not present

## 2021-04-07 LAB — CBC
HCT: 32.2 % — ABNORMAL LOW (ref 36.0–46.0)
Hemoglobin: 11.8 g/dL — ABNORMAL LOW (ref 12.0–15.0)
MCH: 36.8 pg — ABNORMAL HIGH (ref 26.0–34.0)
MCHC: 36.6 g/dL — ABNORMAL HIGH (ref 30.0–36.0)
MCV: 100.3 fL — ABNORMAL HIGH (ref 80.0–100.0)
Platelets: 432 10*3/uL — ABNORMAL HIGH (ref 150–400)
RBC: 3.21 MIL/uL — ABNORMAL LOW (ref 3.87–5.11)
RDW: 11.9 % (ref 11.5–15.5)
WBC: 9.5 10*3/uL (ref 4.0–10.5)
nRBC: 0 % (ref 0.0–0.2)

## 2021-04-07 LAB — SARS CORONAVIRUS 2 (TAT 6-24 HRS): SARS Coronavirus 2: NEGATIVE

## 2021-04-07 LAB — RENAL FUNCTION PANEL
Albumin: 3.2 g/dL — ABNORMAL LOW (ref 3.5–5.0)
Anion gap: 9 (ref 5–15)
BUN: 29 mg/dL — ABNORMAL HIGH (ref 8–23)
CO2: 23 mmol/L (ref 22–32)
Calcium: 9.3 mg/dL (ref 8.9–10.3)
Chloride: 97 mmol/L — ABNORMAL LOW (ref 98–111)
Creatinine, Ser: 0.67 mg/dL (ref 0.44–1.00)
GFR, Estimated: >60 mL/min (ref >60)
Glucose, Bld: 95 mg/dL (ref 70–99)
Phosphorus: 3.7 mg/dL (ref 2.5–4.6)
Potassium: 3.6 mmol/L (ref 3.5–5.1)
Sodium: 129 mmol/L — ABNORMAL LOW (ref 135–145)

## 2021-04-07 LAB — UREA NITROGEN (UUN), 24 HR URINE
Total Volume: 2900
Urea Nitrogen, 24H Ur: 13 g/24 hr (ref 4–16)
Urea Nitrogen, Ur: 432 mg/dL

## 2021-04-07 LAB — MAGNESIUM: Magnesium: 1.5 mg/dL — ABNORMAL LOW (ref 1.7–2.4)

## 2021-04-07 MED ORDER — MAGNESIUM OXIDE -MG SUPPLEMENT 400 (240 MG) MG PO TABS
400.0000 mg | ORAL_TABLET | Freq: Two times a day (BID) | ORAL | Status: DC
  Administered 2021-04-07 – 2021-04-08 (×4): 400 mg via ORAL
  Filled 2021-04-07 (×4): qty 1

## 2021-04-07 MED ORDER — TAMSULOSIN HCL 0.4 MG PO CAPS
0.4000 mg | ORAL_CAPSULE | Freq: Every day | ORAL | Status: DC
  Administered 2021-04-07 – 2021-04-08 (×2): 0.4 mg via ORAL
  Filled 2021-04-07 (×2): qty 1

## 2021-04-07 MED ORDER — UREA 15 G PO PACK
15.0000 g | PACK | Freq: Every day | ORAL | Status: DC
  Administered 2021-04-07 – 2021-04-08 (×2): 15 g via ORAL
  Filled 2021-04-07 (×2): qty 1

## 2021-04-07 NOTE — Plan of Care (Signed)

## 2021-04-07 NOTE — TOC Progression Note (Signed)
Transition of Care [facility]) - Progression Note    Patient Details  Name: DAUNA ZISKA MRN: [id number] Date of Birth: Nov 28, 1955  Transition of Care [facility]) CM/SW [place], [place] Phone Number: 04/07/2021, 2:34 PM  Clinical Narrative:    Wandra Feinstein has received insurance approval for patient. Per MD, will discharge hopefully tomorrow. CSW updated patient's [employer]. She will let CSW how to transport patient. COVID test in process.    Expected Discharge Plan: [place] Barriers to Discharge: Continued Medical Work up  Expected Discharge Plan and Services Expected Discharge Plan: [place] In-house Referral: Clinical Social Work Discharge Planning Services: CM Consult   Living arrangements for the past 2 months: Apartment                                       Social Determinants of Health (SDOH) Interventions    Readmission Risk Interventions No flowsheet data found.

## 2021-04-07 NOTE — Progress Notes (Signed)
Physical Therapy Treatment Patient Details Name: [name] MRN: [id number] DOB: 1956/02/25 Today's Date: 04/07/2021   History of Present Illness Pt presented to ED on 9/12 with etoh abuse with withdrawals and frequent falls at home. Pt also with hyponatremia. Remains hospitalized due to unsafe d/c plan. 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    PT Comments    Patient eager to work with therapy. Doing a much better job with keeping the RW closer to her and only required min cues throughout session to maintain. Continues with very wide base of support with excessive lateral sway to advance the opposite leg. Multiple cues attempted to create more stable gait pattern with little success. Pt continues to run into objects with front wheels of RW which then results in loss of balance requiring assist to recover.     Recommendations for follow up therapy are one component of a multi-disciplinary discharge planning process, led by the attending physician.  Recommendations may be updated based on patient status, additional functional criteria and insurance authorization.  Follow Up Recommendations  SNF     Equipment Recommendations  Rolling walker with 5" wheels    Recommendations for Other Services       Precautions / Restrictions Precautions Precautions: Fall     Mobility  Bed Mobility Overal bed mobility: Needs Assistance Bed Mobility: Supine to Sit     Supine to sit: Supervision;HOB elevated          Transfers Overall transfer level: Needs assistance Equipment used: Rolling walker (2 wheeled) Transfers: Sit to/from Stand Sit to Stand: Supervision         General transfer comment: vc for sequencing with RW  Ambulation/Gait Ambulation/Gait assistance: Min assist Gait Distance (Feet): 150 Feet Assistive device: Rolling walker (2 wheeled) Gait Pattern/deviations: Step-through pattern;Wide base of support Gait velocity: reduced   General  Gait Details: pt with slowed step-through gait, increased lateral pelvic displacement with each step. Ran into objects with front wheel of RW x 3 with staggering LOB each time. Cues for improving knee flexion during swing with limited change in pt's gait. Attempted to facilitate less lateral pelvic displacement to further encourage knee flexion, however this did not improve her flexion, just caused her to take much smaller steps   Stairs             Wheelchair Mobility    Modified Rankin (Stroke Patients Only)       Balance Overall balance assessment: Needs assistance Sitting-balance support: No upper extremity supported;Feet supported Sitting balance-Leahy Scale: Good     Standing balance support: Single extremity supported;Bilateral upper extremity supported Standing balance-Leahy Scale: Poor Standing balance comment: reliant on UE support, requiring physical assistance when without BUE support                            Cognition Arousal/Alertness: Awake/alert Behavior During Therapy: WFL for tasks assessed/performed Overall Cognitive Status: Impaired/Different from baseline Area of Impairment: Awareness;Safety/judgement                         Safety/Judgement: Decreased awareness of deficits;Decreased awareness of safety Awareness: Emergent   General Comments: requires frequent cues for safety; reach back to sit, stay close to rw; avoiding obstacles      Exercises      General Comments        Pertinent Vitals/Pain Pain Assessment: No/denies pain    Home Living  Prior Function            PT Goals (current goals can now be found in the care plan section) Acute Rehab PT Goals Patient Stated Goal: to get stronger and get out of here PT Goal Formulation: With patient Time For Goal Achievement: 04/17/21 Potential to Achieve Goals: Good Progress towards PT goals: Progressing toward goals     Frequency    Min 3X/week      PT Plan Current plan remains appropriate    Co-evaluation              AM-PAC PT "6 Clicks" Mobility   Outcome Measure  Help needed turning from your back to your side while in a flat bed without using bedrails?: A Little Help needed moving from lying on your back to sitting on the side of a flat bed without using bedrails?: A Little Help needed moving to and from a bed to a chair (including a wheelchair)?: A Little Help needed standing up from a chair using your arms (e.g., wheelchair or bedside chair)?: A Little Help needed to walk in hospital room?: A Little Help needed climbing 3-5 steps with a railing? : A Lot 6 Click Score: 17    End of Session Equipment Utilized During Treatment: Gait belt Activity Tolerance: Patient tolerated treatment well Patient left: in chair;with call bell/phone within reach;with chair alarm set   PT Visit Diagnosis: Unsteadiness on feet (R26.81);Repeated falls (R29.6);Ataxic gait (R26.0)     Time: 1660-6004 PT Time Calculation (min) (ACUTE ONLY): 19 min  Charges:  $Gait Training: 8-22 mins                      Arby Barrette, PT Pager [phone]    Rexanne Mano 04/07/2021, 4:06 PM

## 2021-04-07 NOTE — Progress Notes (Signed)
PROGRESS NOTE    [name]  QQI:[id number] DOB: 18-Mar-1956 DOA: 03/21/2021 PCP: Ann Held, DO   Chief Complaint  Patient presents with   Alcohol Problem  Brief Narrative/Hospital Course: 65 year old female with history of hypertension alcohol abuse anxiety, depression presented to the ED on 03/22/2021 with alcohol problem.  As per her friend during admission patient has been weak debilitated at home where she lives alone has been drinking beer regularly. Her friend tried to take her to rehab 03/21/21, she reported that she smelled of alcohol, was confused, and had fallen. In the ED afebrile, hypertensive labs with hyponatremia alcohol level 100 given Ativan and admitted for alcohol withdrawal ataxic gait weakness. Patient was managed for Dts, hyponatremia seen by nephrology.  Subjective: Seen and examined this morning.  She was alert awake but appears very forgetful mentation much better.  No tremors but with unsteady gait, she was resting on the bedside chair.  Open to going to skilled nursing facility.  Assessment & Plan:  Alcohol abuse with withdrawal/early Dts: At this time no signs of alcohol withdrawal doing much improved treated with CIWA scale thiamine folate.  Multiple falls with ataxia/physical deconditioning: Ataxic gait concern for Wernicke's encephalopathy: Managed with thiamine vitamins plan is for PT OT and skilled nursing facility likely has deconditioning  Hyponatremia from volume depletion alcohol abuse decreased solute intake improved with normal saline and increasing solute, on urea and since 9/27 per nephrology and to be continued 1 packet daily until she has adequate solute intake. Recent Labs  Lab 04/03/21 0117 04/04/21 0135 04/05/21 0323 04/06/21 0142 04/07/21 0324  NA 128* 128* 125* 127* 129*   Hypomagnesemia/hypokalemia: Continue oral magnesium oxide  Essential hypertension: Stable on lisinopril  Alcoholic cirrhosis: Will need  outpatient follow-up, alcohol cessation  Urine retention needing in and out cath: Monitor postvoid, added Flomax  Anemia from chronic disease macrocytic.  Monitor  Anxiety and depression:Mood is stable currently.  Diet Order             Diet regular Room service appropriate? Yes; Fluid consistency: Thin; Fluid restriction: 1200 mL Fluid  Diet effective now                   DVT prophylaxis: enoxaparin (LOVENOX) injection 40 mg Start: 03/22/21 1600 Code Status:   Code Status: Full Code  Family Communication: plan of care discussed with patient at bedside. Status is: Inpatient  Remains inpatient appropriate because:IV treatments appropriate due to intensity of illness or inability to take PO and Inpatient level of care appropriate due to severity of illness  Dispo: The patient is from: Home              Anticipated d/c is to: SNF likely tomorrow              Patient currently is not medically stable to d/c.   Difficult to place patient No Objective: Vitals: Today's Vitals   04/07/21 0416 04/07/21 0742 04/07/21 0900 04/07/21 1137  BP: 140/87 (!) 149/96  (!) 153/91  Pulse: 64 70  71  Resp: 16 18  19   Temp: 98.4 F (36.9 C) 98.8 F (37.1 C)  98.5 F (36.9 C)  TempSrc: Oral Oral  Oral  SpO2: 99% 95%  98%  Weight:      Height:      PainSc:   0-No pain    Physical Examination: General exam: Aaox3,weak,older than stated age. HEENT:Oral mucosa moist, Ear/Nose WNL grossly,dentition normal. Respiratory system: B/l clear,BS,no  use of accessory muscle, non tender. Cardiovascular system: S1 & S2 +,No JVD. Gastrointestinal system: Abdomen soft, NT,ND, BS+. Nervous System:Alert, awake, moving extremities. Extremities: Leg edema none, distal peripheral pulses palpable.  Skin: No rashes, no icterus. MSK: small muscle bulk,tone, power.  Medications reviewed:  Scheduled Meds:  acidophilus  1 capsule Oral Daily   enoxaparin (LOVENOX) injection  40 mg Subcutaneous P38S   folic  acid  1 mg Oral Daily   lisinopril  40 mg Oral Daily   magnesium oxide  400 mg Oral BID   metoprolol succinate  100 mg Oral Daily   multivitamin with minerals  1 tablet Oral Daily   pantoprazole  40 mg Oral Daily   sodium chloride flush  3 mL Intravenous Q12H   sodium chloride  2 g Oral TID WC   tamsulosin  0.4 mg Oral Daily   urea  15 g Oral Daily   vitamin B-12  1,000 mcg Oral BID   Continuous Infusions:  sodium chloride 100 mL/hr at 04/06/21 1502    Intake/Output  Intake/Output Summary (Last 24 hours) at 04/07/2021 1303 Last data filed at 04/07/2021 1138 Gross per 24 hour  Intake 1279.97 ml  Output 2322 ml  Net -1042.03 ml   Intake/Output from previous day: 09/28 0701 - 09/29 0700 In: 3336.6 [P.O.:960; I.V.:2376.6] Out: 2422 [Urine:2422] Net IO Since Admission: -9,176.26 mL [04/07/21 1303]   Weight change:   Wt Readings from Last 3 Encounters:  03/28/21 50 kg  03/18/21 51.8 kg  12/27/20 50.8 kg     Consultants: Nephrology  Procedures:see note Antimicrobials: Anti-infectives (From admission, onward)    None      Culture/Microbiology No results found for: SDES, SPECREQUEST, CULT, REPTSTATUS  Other culture-see note  Unresulted Labs (From admission, onward)     Start     Ordered   04/05/21 1456  Urea nitrogen (UUN), 24 hour urine  Once,   R        04/05/21 1455   04/02/21 0500  Renal function panel  Daily,   R     Question:  Specimen collection method  Answer:  Lab=Lab collect   04/01/21 0751            Data Reviewed: I have personally reviewed following labs and imaging studies CBC: Recent Labs  Lab 04/07/21 0324  WBC 9.5  HGB 11.8*  HCT 32.2*  MCV 100.3*  PLT 505*   Basic Metabolic Panel: Recent Labs  Lab 04/03/21 0117 04/04/21 0135 04/05/21 0323 04/06/21 0142 04/07/21 0324  NA 128* 128* 125* 127* 129*  K 3.9 3.3* 4.1 3.5 3.6  CL 98 96* 97* 98 97*  CO2 21* 21* 19* 22 23  GLUCOSE 96 99 87 96 95  BUN 9 11 13  40* 29*  CREATININE  0.62 0.78 0.73 0.70 0.67  CALCIUM 8.7* 9.2 9.2 9.0 9.3  MG  --   --   --   --  1.5*  PHOS 3.0 3.2 3.5 4.0 3.7   GFR: Estimated Creatinine Clearance: 51 mL/min (by C-G formula based on SCr of 0.67 mg/dL). Liver Function Tests: Recent Labs  Lab 04/03/21 0117 04/04/21 0135 04/05/21 0323 04/06/21 0142 04/07/21 0324  ALBUMIN 3.0* 3.2* 3.1* 3.1* 3.2*   No results for input(s): LIPASE, AMYLASE in the last 168 hours. No results for input(s): AMMONIA in the last 168 hours. Coagulation Profile: No results for input(s): INR, PROTIME in the last 168 hours. Cardiac Enzymes: No results for input(s): CKTOTAL, CKMB, CKMBINDEX, TROPONINI in  the last 168 hours. BNP (last 3 results) No results for input(s): PROBNP in the last 8760 hours. HbA1C: No results for input(s): HGBA1C in the last 72 hours. CBG: No results for input(s): GLUCAP in the last 168 hours. Lipid Profile: No results for input(s): CHOL, HDL, LDLCALC, TRIG, CHOLHDL, LDLDIRECT in the last 72 hours. Thyroid Function Tests: No results for input(s): TSH, T4TOTAL, FREET4, T3FREE, THYROIDAB in the last 72 hours. Anemia Panel: No results for input(s): VITAMINB12, FOLATE, FERRITIN, TIBC, IRON, RETICCTPCT in the last 72 hours. Sepsis Labs: No results for input(s): PROCALCITON, LATICACIDVEN in the last 168 hours.  No results found for this or any previous visit (from the past 240 hour(s)).   Radiology Studies: No results found.   LOS: 16 days   Antonieta Pert, MD [facility]  04/07/2021, 1:03 PM

## 2021-04-07 NOTE — Progress Notes (Signed)
  [facility] Progress Note   18-year lady with a history of depression anxiety alcohol abuse and hypertension.  She was to go to detox in [place] but was in early DT's and declined admssion. Upon presentation to the ED she   was found to be profoundly hyponatremic.  Urine osmolality 362 urine sodium 122 serum osmolality 277 sodium 129.  She received a one-time dose of tolvaptan 03/26/2021 with an increase in his sodium to 129.  Then dropped to 122.  Sodium   levels have then fluctuated.   Assessment/ Plan:   Hyponatremia - Urine Osmolality only a little less than serum osmolality and the fact that the patient's Na incr with isotonic fluids argues against SIADH. NS administration is actually increasing the solute excretion which is why the SNa increased. - Continue salt tablets and added  Ure-Na on 9/27 (1 dose given on 9/27) as well to increase solute - Encourage PO intake - 24hr urine did not measure urea; therefore unable to determine if the pt has adequate protein intake.  I am decreasing Ure-Na to 1 packet daily which she should be d/c w until it's confirmed she has adequate solute intake.  Signing off at this time; please reconsult as needed.  Subjective:   Feels better and denies dizziness, SOB, decreased UOP or feeling thirsty.   Objective:   BP 140/87 (BP Location: Right Arm)   Pulse 64   Temp 98.4 F (36.9 C) (Oral)   Resp 16   Ht 5' (1.524 m)   Wt 50 kg   SpO2 99%   BMI 21.53 kg/m   Intake/Output Summary (Last 24 hours) at 04/07/2021 6283 Last data filed at 04/07/2021 0500 Gross per 24 hour  Intake 3336.59 ml  Output 2422 ml  Net 914.59 ml   Weight change:   Physical Exam: General appearance: alert, cooperative, cachectic, Head: NCAT Resp: CTA b/l Cardio: RRR GI: SNDNT + BS Extremities: no edema  Imaging: No results found.  Labs: BMET Recent Labs  Lab 04/02/21 0701 04/02/21 1714 04/03/21 0117 04/04/21 0135 04/05/21 0323 04/06/21 0142  04/07/21 0324  NA 124* 127* 128* 128* 125* 127* 129*  K 3.6 3.9 3.9 3.3* 4.1 3.5 3.6  CL 93* 99 98 96* 97* 98 97*  CO2 21* 18* 21* 21* 19* 22 23  GLUCOSE 96 104* 96 99 87 96 95  BUN 13 12 9 11 13  40* 29*  CREATININE 0.62 0.75 0.62 0.78 0.73 0.70 0.67  CALCIUM 9.3 8.8* 8.7* 9.2 9.2 9.0 9.3  PHOS 3.7 2.9 3.0 3.2 3.5 4.0 3.7   CBC Recent Labs  Lab 04/07/21 0324  WBC 9.5  HGB 11.8*  HCT 32.2*  MCV 100.3*  PLT 432*    Medications:     acidophilus  1 capsule Oral Daily   enoxaparin (LOVENOX) injection  40 mg Subcutaneous M62H   folic acid  1 mg Oral Daily   lisinopril  40 mg Oral Daily   metoprolol succinate  100 mg Oral Daily   multivitamin with minerals  1 tablet Oral Daily   pantoprazole  40 mg Oral Daily   sodium chloride flush  3 mL Intravenous Q12H   sodium chloride  2 g Oral TID WC   urea  15 g Oral Daily   vitamin B-12  1,000 mcg Oral BID      Otelia Santee, MD 04/07/2021, 7:38 AM

## 2021-04-08 ENCOUNTER — Ambulatory Visit: Payer: BC Managed Care – PPO

## 2021-04-08 DIAGNOSIS — F32A Depression, unspecified: Secondary | ICD-10-CM | POA: Diagnosis not present

## 2021-04-08 DIAGNOSIS — D649 Anemia, unspecified: Secondary | ICD-10-CM | POA: Diagnosis not present

## 2021-04-08 DIAGNOSIS — E871 Hypo-osmolality and hyponatremia: Secondary | ICD-10-CM | POA: Diagnosis not present

## 2021-04-08 DIAGNOSIS — R531 Weakness: Secondary | ICD-10-CM | POA: Diagnosis not present

## 2021-04-08 DIAGNOSIS — E538 Deficiency of other specified B group vitamins: Secondary | ICD-10-CM | POA: Diagnosis not present

## 2021-04-08 DIAGNOSIS — D539 Nutritional anemia, unspecified: Secondary | ICD-10-CM | POA: Diagnosis not present

## 2021-04-08 DIAGNOSIS — R5381 Other malaise: Secondary | ICD-10-CM | POA: Diagnosis not present

## 2021-04-08 DIAGNOSIS — F1010 Alcohol abuse, uncomplicated: Secondary | ICD-10-CM | POA: Diagnosis not present

## 2021-04-08 DIAGNOSIS — F1020 Alcohol dependence, uncomplicated: Secondary | ICD-10-CM | POA: Diagnosis not present

## 2021-04-08 DIAGNOSIS — I10 Essential (primary) hypertension: Secondary | ICD-10-CM | POA: Diagnosis not present

## 2021-04-08 DIAGNOSIS — Z7401 Bed confinement status: Secondary | ICD-10-CM | POA: Diagnosis not present

## 2021-04-08 DIAGNOSIS — Z79899 Other long term (current) drug therapy: Secondary | ICD-10-CM | POA: Diagnosis not present

## 2021-04-08 DIAGNOSIS — F419 Anxiety disorder, unspecified: Secondary | ICD-10-CM | POA: Diagnosis not present

## 2021-04-08 DIAGNOSIS — D638 Anemia in other chronic diseases classified elsewhere: Secondary | ICD-10-CM | POA: Diagnosis not present

## 2021-04-08 DIAGNOSIS — M25511 Pain in right shoulder: Secondary | ICD-10-CM | POA: Diagnosis not present

## 2021-04-08 DIAGNOSIS — R339 Retention of urine, unspecified: Secondary | ICD-10-CM | POA: Diagnosis not present

## 2021-04-08 LAB — RENAL FUNCTION PANEL
Albumin: 3 g/dL — ABNORMAL LOW (ref 3.5–5.0)
Anion gap: 8 (ref 5–15)
BUN: 19 mg/dL (ref 8–23)
CO2: 23 mmol/L (ref 22–32)
Calcium: 8.9 mg/dL (ref 8.9–10.3)
Chloride: 98 mmol/L (ref 98–111)
Creatinine, Ser: 0.96 mg/dL (ref 0.44–1.00)
GFR, Estimated: >60 mL/min (ref >60)
Glucose, Bld: 99 mg/dL (ref 70–99)
Phosphorus: 3.9 mg/dL (ref 2.5–4.6)
Potassium: 3.6 mmol/L (ref 3.5–5.1)
Sodium: 129 mmol/L — ABNORMAL LOW (ref 135–145)

## 2021-04-08 MED ORDER — THIAMINE HCL 250 MG PO TABS: 250.0000 mg | ORAL_TABLET | Freq: Every day | ORAL | 0 refills | Status: AC

## 2021-04-08 MED ORDER — CYANOCOBALAMIN 1000 MCG PO TABS: 1000.0000 ug | ORAL_TABLET | Freq: Every day | ORAL | Status: AC

## 2021-04-08 MED ORDER — FOLIC ACID 1 MG PO TABS: 1.0000 mg | ORAL_TABLET | Freq: Every day | ORAL | Status: DC

## 2021-04-08 MED ORDER — PANTOPRAZOLE SODIUM 40 MG PO TBEC: 40.0000 mg | DELAYED_RELEASE_TABLET | Freq: Every day | ORAL | Status: DC

## 2021-04-08 MED ORDER — TAMSULOSIN HCL 0.4 MG PO CAPS: 0.4000 mg | ORAL_CAPSULE | Freq: Every day | ORAL | Status: DC

## 2021-04-08 MED ORDER — SODIUM CHLORIDE 1 G PO TABS: 2.0000 g | ORAL_TABLET | Freq: Three times a day (TID) | ORAL | Status: AC

## 2021-04-08 MED ORDER — MELATONIN 5 MG PO TABS: 5.0000 mg | ORAL_TABLET | Freq: Every evening | ORAL | 0 refills | Status: AC | PRN

## 2021-04-08 MED ORDER — UREA 15 G PO PACK: 15.0000 g | PACK | Freq: Every day | ORAL | Status: DC

## 2021-04-08 MED ORDER — MAGNESIUM OXIDE -MG SUPPLEMENT 400 (240 MG) MG PO TABS: 400.0000 mg | ORAL_TABLET | Freq: Two times a day (BID) | ORAL | Status: DC

## 2021-04-08 MED ORDER — ADULT MULTIVITAMIN W/MINERALS CH
1.0000 | ORAL_TABLET | Freq: Every day | ORAL | Status: DC
Start: 2021-04-08 — End: 2022-08-24

## 2021-04-08 NOTE — Discharge Summary (Signed)
Physician Discharge Summary  UMI MAINOR MWN:[id number] DOB: 03-15-1956 DOA: 03/21/2021  PCP: Ann Held, DO  Admit date: 03/21/2021 Discharge date: 04/08/2021  Admitted From: home Disposition:  SNF  Recommendations for Outpatient Follow-up:  Follow up with PCP in 1-2 weeks Please obtain BMP/CBC in one week Please follow up on the following pending results:  Home Health:NO  Equipment/Devices: NONE  Discharge Condition: Stable Code Status:   Code Status: Full Code Diet recommendation:  Diet Order             Diet regular Room service appropriate? Yes; Fluid consistency: Thin; Fluid restriction: 1200 mL Fluid  Diet effective now                    Brief/Interim Summary:  65 year old female with history of hypertension alcohol abuse anxiety, depression presented to the ED on 03/22/2021 with alcohol problem.  As per her friend during admission patient has been weak debilitated at home where she lives alone has been drinking beer regularly. Her friend tried to take her to rehab 03/21/21, she reported that she smelled of alcohol, was confused, and had fallen. In the ED afebrile, hypertensive labs with hyponatremia alcohol level 100 given Ativan and admitted for alcohol withdrawal ataxic gait weakness. Patient was managed for Dts, hyponatremia seen by nephrology At this time sodium stabilized, no evidence of withdrawal, she is very deconditioned and weak and frail and will benefit with skilled nursing facility placement which has been approved.  Discharge Diagnoses:  Alcohol abuse with withdrawal/early Dts: At this time no signs of alcohol withdrawal doing much improved treated with CIWA scale thiamine folate.  Currently alcohol cessation  Multiple falls with ataxia/physical deconditioning: Ataxic gait concern for Wernicke's encephalopathy: MRI brain motion degraded no intracranial pathology no definite evidence of Wernicke's encephalopathy, marked global parenchymal  volume loss. managed with thiamine vitamins plan is for PT OT and skilled nursing facility likely has deconditioning, and she seems to be improving  Hyponatremia from volume depletion alcohol abuse decreased solute intake improved with normal saline and increasing solute, on urea and since 9/27 per nephrology and to be continued 1 packet daily until she has adequate solute intake, also managed with salt tablets.  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 Voiding well.  Anemia from chronic disease macrocytic.  Monitor  Anxiety and depression:Mood is stable currently.   Consults: NEPHROLOGY  Subjective: Aaox3, feels better, on bedside chair Did not eat much breakfast looking at the plate nearby  Discharge Exam: Vitals:   04/08/21 0355 04/08/21 0717  BP: 121/77 132/85  Pulse: 60 80  Resp: 18 17  Temp: 98.8 F (37.1 C) 98.8 F (37.1 C)  SpO2: 100% 98%   General: Pt is alert, awake, not in acute distress Cardiovascular: RRR, S1/S2 +, no rubs, no gallops Respiratory: CTA bilaterally, no wheezing, no rhonchi Abdominal: Soft, NT, ND, bowel sounds + Extremities: no edema, no cyanosis  Discharge Instructions  Discharge Instructions     Discharge instructions   Complete by: As directed    TAKE UREA NA packet  until  [place] then STOP it once eating  food well.  Check BMP in 1 WEEK TO MONITOR SODIUM LEVEL.  Please call call MD or return to ER for similar or worsening recurring problem that brought you to hospital or if any fever,nausea/vomiting,abdominal pain,  uncontrolled pain, chest pain,  shortness of breath or any other alarming symptoms.  Please follow-up your doctor as instructed in a week time and call the office for appointment.  Please avoid alcohol, smoking, or any other illicit substance and maintain healthy habits including taking your regular medications as prescribed.  You were cared for by a hospitalist during your hospital stay. If you have any questions about your discharge medications or the care you received while you were in the hospital after you are discharged, you can call the unit and ask to speak with the hospitalist on call if the hospitalist that took care of you is not available.  Once you are discharged, your primary care physician will handle any further medical issues. Please note that NO REFILLS for any discharge medications will be authorized once you are discharged, as it is imperative that you return to your primary care physician (or establish a relationship with a primary care physician if you do not have one) for your aftercare needs so that they can reassess your need for medications and monitor your lab values   Increase activity slowly   Complete by: As directed    No wound care   Complete by: As directed       Allergies as of 04/08/2021       Reactions   Codeine Other (See Comments)   REACTION: insomnia and anxious        Medication List     STOP taking these medications    alendronate 70 MG tablet Commonly known as: FOSAMAX   mirabegron ER 50 MG Tb24 tablet Commonly known as: Myrbetriq       TAKE these medications    cyanocobalamin 1000 MCG tablet Take 1 tablet (1,000 mcg total) by mouth daily.   folic acid 1 MG tablet Commonly known as: FOLVITE Take 1 tablet (1 mg total) by mouth daily.   lisinopril 40 MG tablet Commonly known as: ZESTRIL TAKE 1 TABLET BY MOUTH EVERY DAY   magnesium oxide 400 (240 Mg) MG tablet Commonly known as: MAG-OX Take 1 tablet (400 mg total) by mouth 2  (two) times daily.   melatonin 5 MG Tabs Take 1 tablet (5 mg total) by mouth at bedtime as needed.   metoprolol succinate 100 MG 24 hr tablet Commonly known as: TOPROL-XL Take 1 tablet (100 mg total) by mouth daily. Take with or immediately following a meal.   multivitamin with minerals Tabs tablet Take 1 tablet by mouth daily.   pantoprazole 40 MG tablet Commonly known as: PROTONIX Take 1 tablet (40 mg total) by mouth daily.   sodium chloride 1 g tablet Take 2 tablets (2 g total) by mouth 3 (three) times daily with meals for 7 days.   tamsulosin 0.4 MG Caps capsule Commonly known as: FLOMAX Take 1 capsule (0.4 mg total) by mouth daily.   thiamine 250 MG tablet Take 1 tablet (250 mg total) by mouth daily.   urea 15 g Pack oral packet Commonly known as: URE-NA Take 15 g by mouth daily. TAKE IT UNTIL YOU HAVE CONSISTENT SOLUTE INTAKE then STOP it once eating food well.        Contact information for follow-up providers     Carollee Herter, Alferd Apa, DO .  Specialty: Family Medicine Why: To check BMP in 1 week Contact information: [place] [address] [phone]              Contact information for after-discharge care     Destination     [facility] .   Service: Skilled Nursing Contact information: [address] [address] [phone]                    Allergies  Allergen Reactions   Codeine Other (See Comments)    REACTION: insomnia and anxious    The results of significant diagnostics from this hospitalization (including imaging, microbiology, ancillary and laboratory) are listed below for reference.    Microbiology: Recent Results (from the past 240 hour(s))  SARS CORONAVIRUS 2 (TAT 6-24 HRS) Nasopharyngeal Nasopharyngeal Swab     Status: None   Collection Time: 04/07/21  1:51 PM   Specimen: Nasopharyngeal Swab  Result Value Ref Range Status   SARS  Coronavirus 2 NEGATIVE NEGATIVE Final    Comment: (NOTE) SARS-CoV-2 target nucleic acids are NOT DETECTED.  The SARS-CoV-2 RNA is generally detectable in upper and lower respiratory specimens during the acute phase of infection. Negative results do not preclude SARS-CoV-2 infection, do not rule out co-infections with other pathogens, and should not be used as the sole basis for treatment or other patient management decisions. Negative results must be combined with clinical observations, patient history, and epidemiological information. The expected result is Negative.  Fact Sheet for Patients: SugarRoll.be  Fact Sheet for Healthcare Providers: https://www.woods-mathews.com/  This test is not yet approved or cleared by the [place] FDA and  has been authorized for detection and/or diagnosis of SARS-CoV-2 by FDA under an Emergency Use Authorization (EUA). This EUA will remain  in effect (meaning this test can be used) for the duration of the COVID-19 declaration under Se ction 564(b)(1) of the Act, 21 U.S.C. section 360bbb-3(b)(1), unless the authorization is terminated or revoked sooner.  Performed at [facility], [place] [address], [place], [place] [address]     Procedures/Studies: MR BRAIN WO CONTRAST  Result Date: 03/23/2021 CLINICAL DATA:  Ataxia, question Wernicke's encephalopathy EXAM: MRI HEAD WITHOUT CONTRAST TECHNIQUE: Multiplanar, multiecho pulse sequences of the brain and surrounding structures were obtained without intravenous contrast. COMPARISON:  CT head 02/21/2021 FINDINGS: The study is limited due to patient cooperation. The provided sequences are moderately motion degraded. Brain: There is no evidence of acute intracranial hemorrhage, extra-axial fluid collection, or infarct. There is marked global parenchymal volume loss with enlargement of the ventricular system, advanced for age. There is no discernible lobar  predominance. Confluent T2/FLAIR hyperintensity in the subcortical and periventricular white matter likely reflects sequela of chronic white matter microangiopathy. There are no findings of Wernicke's encephalopathy, though the mamillary bodies are not well evaluated. Vascular: Normal flow voids. Skull and upper cervical spine: Normal marrow signal. Sinuses/Orbits: The paranasal sinuses are clear. The globes and orbits are unremarkable. Other: None. IMPRESSION: 1. Motion degraded study as above. 2. No evidence of acute intracranial pathology. Specifically, no definite evidence of Wernicke's encephalopathy, though the mamillary bodies are not well evaluated. Consider repeat imaging with intravenous contrast when the patient can tolerate, if indicated. 3. Marked global parenchymal volume loss without discernible lobar predominance, advanced for age. Electronically Signed   By: Valetta Mole M.D.   On: 03/23/2021 11:43   US Abdomen Limited RUQ (LIVER/GB)  Result  Date: 03/21/2021 CLINICAL DATA:  alcoholic cirrhosis EXAM: ULTRASOUND ABDOMEN LIMITED RIGHT UPPER QUADRANT COMPARISON:  None. FINDINGS: Gallbladder: The gallbladder is nondilated without visible intraluminal stones. There is a non dependent, nonshadowing 4 mm nodular focus along the hepatic side gallbladder wall, which could be adherent sludge or small polyp, the latter of which would require no follow-up per consensus guidelines. Common bile duct: Diameter: 9 mm.  No obstructing lesion identified sonographically. Liver: Diffusely increased liver echogenicity with mildly nodular contours. Portal vein is patent on color Doppler imaging with normal direction of blood flow towards the liver. Other: None. IMPRESSION: Hepatic steatosis with mildly nodular liver contours, suggesting cirrhosis. No ascites. Dilated common bile duct measuring 9 mm, previously 10 mm on prior ultrasound in October 2021. If not already performed, MRI/MRCP is recommended for further  evaluation. Electronically Signed   By: Maurine Simmering M.D.   On: 03/21/2021 11:56    Labs: BNP (last 3 results) Recent Labs    03/25/21 0057 03/26/21 0050 03/27/21 0113  BNP 82.3 64.4 92.4   Basic Metabolic Panel: Recent Labs  Lab 04/04/21 0135 04/05/21 0323 04/06/21 0142 04/07/21 0324 04/08/21 0117  NA 128* 125* 127* 129* 129*  K 3.3* 4.1 3.5 3.6 3.6  CL 96* 97* 98 97* 98  CO2 21* 19* 22 23 23   GLUCOSE 99 87 96 95 99  BUN 11 13 40* 29* 19  CREATININE 0.78 0.73 0.70 0.67 0.96  CALCIUM 9.2 9.2 9.0 9.3 8.9  MG  --   --   --  1.5*  --   PHOS 3.2 3.5 4.0 3.7 3.9   Liver Function Tests: Recent Labs  Lab 04/04/21 0135 04/05/21 0323 04/06/21 0142 04/07/21 0324 04/08/21 0117  ALBUMIN 3.2* 3.1* 3.1* 3.2* 3.0*   No results for input(s): LIPASE, AMYLASE in the last 168 hours. No results for input(s): AMMONIA in the last 168 hours. CBC: Recent Labs  Lab 04/07/21 0324  WBC 9.5  HGB 11.8*  HCT 32.2*  MCV 100.3*  PLT 432*   Cardiac Enzymes: No results for input(s): CKTOTAL, CKMB, CKMBINDEX, TROPONINI in the last 168 hours. BNP: Invalid input(s): POCBNP CBG: No results for input(s): GLUCAP in the last 168 hours. D-Dimer No results for input(s): DDIMER in the last 72 hours. Hgb A1c No results for input(s): HGBA1C in the last 72 hours. Lipid Profile No results for input(s): CHOL, HDL, LDLCALC, TRIG, CHOLHDL, LDLDIRECT in the last 72 hours. Thyroid function studies No results for input(s): TSH, T4TOTAL, T3FREE, THYROIDAB in the last 72 hours.  Invalid input(s): FREET3 Anemia work up No results for input(s): VITAMINB12, FOLATE, FERRITIN, TIBC, IRON, RETICCTPCT in the last 72 hours. Urinalysis    Component Value Date/Time   COLORURINE YELLOW 04/04/2021 2308   APPEARANCEUR TURBID (A) 04/04/2021 2308   LABSPEC 1.005 04/04/2021 2308   PHURINE 6.0 04/04/2021 2308   GLUCOSEU NEGATIVE 04/04/2021 2308   HGBUR LARGE (A) 04/04/2021 2308   HGBUR large 10/22/2009 1550    BILIRUBINUR NEGATIVE 04/04/2021 2308   BILIRUBINUR Negative 06/28/2020 1412   KETONESUR NEGATIVE 04/04/2021 2308   PROTEINUR NEGATIVE 04/04/2021 2308   UROBILINOGEN 0.2 06/28/2020 1412   UROBILINOGEN 0.2 10/22/2009 1550   NITRITE NEGATIVE 04/04/2021 2308   LEUKOCYTESUR LARGE (A) 04/04/2021 2308   Sepsis Labs Invalid input(s): PROCALCITONIN,  WBC,  LACTICIDVEN Microbiology Recent Results (from the past 240 hour(s))  SARS CORONAVIRUS 2 (TAT 6-24 HRS) Nasopharyngeal Nasopharyngeal Swab     Status: None   Collection Time: 04/07/21  1:51  PM   Specimen: Nasopharyngeal Swab  Result Value Ref Range Status   SARS Coronavirus 2 NEGATIVE NEGATIVE Final    Comment: (NOTE) SARS-CoV-2 target nucleic acids are NOT DETECTED.  The SARS-CoV-2 RNA is generally detectable in upper and lower respiratory specimens during the acute phase of infection. Negative results do not preclude SARS-CoV-2 infection, do not rule out co-infections with other pathogens, and should not be used as the sole basis for treatment or other patient management decisions. Negative results must be combined with clinical observations, patient history, and epidemiological information. The expected result is Negative.  Fact Sheet for Patients: SugarRoll.be  Fact Sheet for Healthcare Providers: https://www.woods-mathews.com/  This test is not yet approved or cleared by the [place] FDA and  has been authorized for detection and/or diagnosis of SARS-CoV-2 by FDA under an Emergency Use Authorization (EUA). This EUA will remain  in effect (meaning this test can be used) for the duration of the COVID-19 declaration under Se ction 564(b)(1) of the Act, 21 U.S.C. section 360bbb-3(b)(1), unless the authorization is terminated or revoked sooner.  Performed at [facility], [place] [address], [place], [place] [address]      Time coordinating discharge: 35  minutes  SIGNED: Antonieta Pert, MD  [facility] 04/08/2021, 8:53 AM  If 7PM-7AM, please contact night-coverage [email or web address]

## 2021-04-08 NOTE — TOC Transition Note (Signed)
Transition of Care [facility]) - CM/SW Discharge Note   Patient Details  Name: [name] MRN: [id number] Date of Birth: [birth year]  [facility] CM/SW Contact:  Benard Halsted, LCSW Phone Number: 04/08/2021, 10:56 AM   Clinical Narrative:    Patient will DC to: [facility] Anticipated DC date: 04/08/21 Family notified: [name] Transport by: Corey Harold   Per MD patient ready for DC to [facility]. RN to call report prior to discharge [phone]). RN, patient, patient's family, and facility notified of DC. Discharge Summary and FL2 sent to facility. DC packet on chart. Ambulance transport requested for patient.   CSW will sign off for now as social work intervention is no longer needed. Please consult [place] again if new needs arise.     Final next level of care: Skilled Nursing Facility Barriers to Discharge: Barriers Resolved   Patient Goals and CMS Choice Patient states their goals for this hospitalization and ongoing recovery are:: Rehab CMS Medicare.gov Compare Post Acute Care list provided to:: Patient Choice offered to / list presented to : Patient, [facility] POA / Guardian  Discharge Placement   Existing PASRR number confirmed : 04/08/21          Patient chooses bed at: [facility] Patient to be transferred to facility by: [place] Name of family member notified: [name] Patient and family notified of of transfer: 04/08/21  Discharge Plan and Services In-house Referral: Clinical Social Work Discharge Planning Services: CM Consult                                 Social Determinants of Health (SDOH) Interventions     Readmission Risk Interventions No flowsheet data found.

## 2021-04-09 NOTE — Progress Notes (Signed)
Patient discharged to [facility] per orders. Discharge instructions reviewed with patient per Freddrick March, Dayshift RN. D/C packet given to PTAR. Patient verbalizes understanding. IV discontinued previous shift per orders. Belongings packed up by staff. Patient A&O x4, no distress noted. Denies needs at this time. Patient off unit via stretcher by PTAR.Marland Kitchen

## 2021-04-13 DIAGNOSIS — E871 Hypo-osmolality and hyponatremia: Secondary | ICD-10-CM | POA: Diagnosis not present

## 2021-04-13 DIAGNOSIS — D539 Nutritional anemia, unspecified: Secondary | ICD-10-CM | POA: Diagnosis not present

## 2021-04-13 DIAGNOSIS — I10 Essential (primary) hypertension: Secondary | ICD-10-CM | POA: Diagnosis not present

## 2021-04-13 DIAGNOSIS — F1020 Alcohol dependence, uncomplicated: Secondary | ICD-10-CM | POA: Diagnosis not present

## 2021-04-14 DIAGNOSIS — E871 Hypo-osmolality and hyponatremia: Secondary | ICD-10-CM | POA: Diagnosis not present

## 2021-04-14 DIAGNOSIS — R5381 Other malaise: Secondary | ICD-10-CM | POA: Diagnosis not present

## 2021-04-14 DIAGNOSIS — D539 Nutritional anemia, unspecified: Secondary | ICD-10-CM | POA: Diagnosis not present

## 2021-04-14 DIAGNOSIS — I10 Essential (primary) hypertension: Secondary | ICD-10-CM | POA: Diagnosis not present

## 2021-04-18 ENCOUNTER — Telehealth: Payer: Self-pay | Admitting: *Deleted

## 2021-04-18 NOTE — Chronic Care Management (AMB) (Signed)
  Care Management   Outreach Note  04/18/2021 Name: [name] MRN: [id number] DOB: 05/03/1956  Referred by: Ann Held, DO Reason for referral : Care Coordination (Outreach to reschedule missed initial with Licensed Clinical SW )   An unsuccessful telephone outreach was attempted today. The patient was referred to the case management team for assistance with care management and care coordination.   Follow Up Plan:  A HIPAA compliant phone message was left for the patient providing contact information and requesting a return call.  If patient returns call to provider office, please advise to call Embedded Care Management Care Guide Chesnee Floren at [place], [place] Management  Direct Dial: [phone]

## 2021-04-19 ENCOUNTER — Telehealth: Payer: BC Managed Care – PPO | Admitting: *Deleted

## 2021-04-19 ENCOUNTER — Telehealth: Payer: Self-pay | Admitting: *Deleted

## 2021-04-19 NOTE — Telephone Encounter (Cosign Needed)
  Care Management   Follow Up Note   04/19/2021 Name: [name] MRN: [id number] DOB: Sep 30, 1955   Referred by: Ann Held, DO Reason for referral : Chronic Care Management in patient with TIA, HTN, Alcoholic Cirrhosis, Anxiety, Depression, Alcohol Abuse and Withdrawal.  An unsuccessful telephone outreach was attempted today. The patient was referred to the case management team for assistance with care management and care coordination. A HIPAA compliant message was left on voicemail for patient, including contact information, encouraging patient to return LCSW's call at her earliest convenience.  After thorough chart review, LCSW noted that patient was placed at [facility], [place], on 04/08/2021, upon discharge from the hospital, to receive short-term rehabilitative services.  Follow-Up Plan:  LCSW will place a referral to the Care Guides to reschedule an initial telephone outreach call with LCSW, as soon as patient is discharged back into the community.  [place] Licensed Clinical Social Worker [place] [phone]

## 2021-04-20 DIAGNOSIS — D539 Nutritional anemia, unspecified: Secondary | ICD-10-CM | POA: Diagnosis not present

## 2021-04-20 DIAGNOSIS — R5381 Other malaise: Secondary | ICD-10-CM | POA: Diagnosis not present

## 2021-04-20 DIAGNOSIS — E538 Deficiency of other specified B group vitamins: Secondary | ICD-10-CM | POA: Diagnosis not present

## 2021-04-20 DIAGNOSIS — I10 Essential (primary) hypertension: Secondary | ICD-10-CM | POA: Diagnosis not present

## 2021-04-27 DIAGNOSIS — D638 Anemia in other chronic diseases classified elsewhere: Secondary | ICD-10-CM | POA: Diagnosis not present

## 2021-04-27 DIAGNOSIS — E871 Hypo-osmolality and hyponatremia: Secondary | ICD-10-CM | POA: Diagnosis not present

## 2021-04-27 DIAGNOSIS — E538 Deficiency of other specified B group vitamins: Secondary | ICD-10-CM | POA: Diagnosis not present

## 2021-04-27 DIAGNOSIS — R5381 Other malaise: Secondary | ICD-10-CM | POA: Diagnosis not present

## 2021-04-27 NOTE — Chronic Care Management (AMB) (Signed)
  Care Management   Outreach Note  04/27/2021 Name: [name] MRN: [id number] DOB: 11/19/1955  Referred by: Ann Held, DO Reason for referral : Care Coordination (Outreach to reschedule missed initial with Licensed Clinical SW )   A second unsuccessful telephone outreach was attempted today. The patient was referred to the case management team for assistance with care management and care coordination.   Follow Up Plan:  If patient returns call to provider office, please advise to call Embedded Care Management Care Guide [name] at [place], [place] Management  Direct Dial: [phone]

## 2021-04-29 ENCOUNTER — Telehealth: Payer: BC Managed Care – PPO | Admitting: *Deleted

## 2021-04-29 ENCOUNTER — Telehealth: Payer: Self-pay | Admitting: *Deleted

## 2021-04-29 NOTE — Chronic Care Management (AMB) (Signed)
  Care Management   Note  04/29/2021 Name: [name] MRN: [id number] DOB: 12-25-1955  [name] is a 65 y.o. year old female who is a primary care patient of Ann Held, DO and is actively engaged with the care management team. I reached out to Mickle Mallory by phone today to assist with re-scheduling an initial visit with the Licensed Clinical Social Worker  Follow up plan: Unsuccessful telephone outreach attempt made. A HIPAA compliant phone message was left for the patient providing contact information and requesting a return call.  The care management team will reach out to the patient again over the next 7 days.  If patient returns call to provider office, please advise to call [place] at [place] Management  Direct Dial: [phone]

## 2021-04-29 NOTE — Telephone Encounter (Cosign Needed)
  Care Management   Follow Up Note   04/29/2021 Name: [name] MRN: [id number] DOB: [birth year]   Referred by: Ann Held, DO  Reason for referral : Chronic Care Management in Patient with Alcoholic Cirrhosis of Liver without Ascites, Alcohol Abuse, Depression with anxiety, Essential Hypertension and TIA.  An unsuccessful telephone outreach was attempted today. The patient was referred to the case management team for assistance with care management and care coordination. A HIPAA compliant message was left on voicemail, including contact information, encouraging patient to return LCSW's call at her earliest convenience.  LCSW will make another initial telephone outreach call attempt to patient within the next 5-7 business days, if a return call is not received in the meantime.  Follow-Up Plan:  Request placed to Care Guides to schedule another initial telephone outreach call with patient.  [place] Licensed Clinical Social Worker [place] [phone]

## 2021-04-29 NOTE — Chronic Care Management (AMB) (Signed)
Pt scheduled for 27/51/7001 with Licensed Clinical SW by clinician

## 2021-05-02 NOTE — Chronic Care Management (AMB) (Signed)
  Care Management   Note  05/02/2021 Name: ZAYANNA PUNDT MRN: [id number] DOB: 01/08/1956  MAUDY YONAN is a 65 y.o. year old female who is a primary care patient of Ann Held, DO and is actively engaged with the care management team. I reached out to Mickle Mallory by phone today to assist with re-scheduling an initial visit with the Licensed Clinical Social Worker  Follow up plan: Unsuccessful telephone outreach attempt made. A HIPAA compliant phone message was left for the patient providing contact information and requesting a return call.  The care management team will reach out to the patient again over the next 7 days.  If patient returns call to provider office, please advise to call [place] at [phone].  [place] Management  Direct Dial: [phone]

## 2021-05-04 ENCOUNTER — Telehealth: Payer: BC Managed Care – PPO | Admitting: *Deleted

## 2021-05-04 ENCOUNTER — Telehealth: Payer: Self-pay | Admitting: *Deleted

## 2021-05-04 NOTE — Telephone Encounter (Cosign Needed)
  Care Management   Follow Up Note   05/04/2021 Name: [name] MRN: [id number] DOB: 07-14-1955   Referred by: Ann Held, DO  Reason for referral : Chronic Care Management in Patient with Alcoholic Cirrhosis of Liver without Ascites, Alcohol Abuse, Depression with anxiety, Essential Hypertension and TIA.  An unsuccessful telephone outreach was attempted today. The patient was referred to the case management team for assistance with care management and care coordination. A HIPAA compliant message was left on voicemail, including contact information, encouraging patient to return LCSW's call at her earliest convenience.  LCSW will make another initial telephone outreach call attempt to patient within the next 5-7 business days, if a return call is not received in the meantime.   Update:  Scheduling Care Guide was able to make contact with patient and reschedule the initial telephone outreach call.   Follow-Up Plan:  05/16/2021 at 11:30am  [place] Clinical Social Worker [place] [phone]

## 2021-05-04 NOTE — Chronic Care Management (AMB) (Signed)
  Care Management   Note  05/04/2021 Name: [name] MRN: [id number] DOB: [birth year]  [name] is a 65 y.o. year old female who is a primary care patient of Ann Held, DO and is actively engaged with the care management team. I reached out to Mickle Mallory by phone today to assist with re-scheduling an initial visit with the Licensed Clinical Social Worker  Follow up plan: Unsuccessful telephone outreach attempt made. A HIPAA compliant phone message was left for the patient providing contact information and requesting a return call.   Julian Hy, [place] Management  Direct Dial: [phone]

## 2021-05-04 NOTE — Chronic Care Management (AMB) (Signed)
  Care Management   Note  05/04/2021 Name: LATRICE STORLIE MRN: [id number] DOB: [birth year]  MARIACRISTINA ADAY is a 65 y.o. year old female who is a primary care patient of Ann Held, DO and is actively engaged with the care management team. I reached out to Mickle Mallory by phone today to assist with re-scheduling an initial visit with the Licensed Clinical Social Worker  Follow up plan: Telephone appointment with care management team member scheduled for: 05/16/2021  Julian Hy, [place], [place] Management  Direct Dial: [phone]

## 2021-05-06 DIAGNOSIS — R339 Retention of urine, unspecified: Secondary | ICD-10-CM | POA: Diagnosis not present

## 2021-05-06 DIAGNOSIS — Z79899 Other long term (current) drug therapy: Secondary | ICD-10-CM | POA: Diagnosis not present

## 2021-05-06 DIAGNOSIS — M25511 Pain in right shoulder: Secondary | ICD-10-CM | POA: Diagnosis not present

## 2021-05-06 DIAGNOSIS — E871 Hypo-osmolality and hyponatremia: Secondary | ICD-10-CM | POA: Diagnosis not present

## 2021-05-09 DIAGNOSIS — F1020 Alcohol dependence, uncomplicated: Secondary | ICD-10-CM | POA: Diagnosis not present

## 2021-05-09 DIAGNOSIS — F419 Anxiety disorder, unspecified: Secondary | ICD-10-CM | POA: Diagnosis not present

## 2021-05-09 DIAGNOSIS — M6281 Muscle weakness (generalized): Secondary | ICD-10-CM | POA: Diagnosis not present

## 2021-05-09 DIAGNOSIS — E538 Deficiency of other specified B group vitamins: Secondary | ICD-10-CM | POA: Diagnosis not present

## 2021-05-09 DIAGNOSIS — R260 Ataxic gait: Secondary | ICD-10-CM | POA: Diagnosis not present

## 2021-05-09 DIAGNOSIS — I10 Essential (primary) hypertension: Secondary | ICD-10-CM | POA: Diagnosis not present

## 2021-05-09 DIAGNOSIS — D539 Nutritional anemia, unspecified: Secondary | ICD-10-CM | POA: Diagnosis not present

## 2021-05-09 DIAGNOSIS — M25511 Pain in right shoulder: Secondary | ICD-10-CM | POA: Diagnosis not present

## 2021-05-09 DIAGNOSIS — F32A Depression, unspecified: Secondary | ICD-10-CM | POA: Diagnosis not present

## 2021-05-09 DIAGNOSIS — R339 Retention of urine, unspecified: Secondary | ICD-10-CM | POA: Diagnosis not present

## 2021-05-09 DIAGNOSIS — Z9181 History of falling: Secondary | ICD-10-CM | POA: Diagnosis not present

## 2021-05-09 DIAGNOSIS — K7030 Alcoholic cirrhosis of liver without ascites: Secondary | ICD-10-CM | POA: Diagnosis not present

## 2021-05-09 DIAGNOSIS — G8929 Other chronic pain: Secondary | ICD-10-CM | POA: Diagnosis not present

## 2021-05-10 ENCOUNTER — Other Ambulatory Visit: Payer: Self-pay

## 2021-05-10 ENCOUNTER — Ambulatory Visit (INDEPENDENT_AMBULATORY_CARE_PROVIDER_SITE_OTHER): Payer: BC Managed Care – PPO | Admitting: Family Medicine

## 2021-05-10 ENCOUNTER — Encounter: Payer: Self-pay | Admitting: Family Medicine

## 2021-05-10 VITALS — BP 144/78 | HR 78 | Temp 97.8°F | Resp 16 | Ht 60.0 in | Wt 104.2 lb

## 2021-05-10 DIAGNOSIS — I10 Essential (primary) hypertension: Secondary | ICD-10-CM | POA: Diagnosis not present

## 2021-05-10 DIAGNOSIS — R531 Weakness: Secondary | ICD-10-CM | POA: Diagnosis not present

## 2021-05-10 DIAGNOSIS — K7030 Alcoholic cirrhosis of liver without ascites: Secondary | ICD-10-CM

## 2021-05-10 DIAGNOSIS — G20 Parkinson's disease: Secondary | ICD-10-CM | POA: Insufficient documentation

## 2021-05-10 LAB — BASIC METABOLIC PANEL
BUN: 12 mg/dL (ref 6–23)
CO2: 25 mEq/L (ref 19–32)
Calcium: 10.1 mg/dL (ref 8.4–10.5)
Chloride: 92 mEq/L — ABNORMAL LOW (ref 96–112)
Creatinine, Ser: 0.7 mg/dL (ref 0.40–1.20)
GFR: 91.45 mL/min (ref >60.00)
Glucose, Bld: 73 mg/dL (ref 70–99)
Potassium: 4.7 mEq/L (ref 3.5–5.1)
Sodium: 128 mEq/L — ABNORMAL LOW (ref 135–145)

## 2021-05-10 LAB — CBC WITH DIFFERENTIAL/PLATELET
Basophils Absolute: 0.1 10*3/uL (ref 0.0–0.1)
Basophils Relative: 0.8 % (ref 0.0–3.0)
Eosinophils Absolute: 0.1 10*3/uL (ref 0.0–0.7)
Eosinophils Relative: 1.4 % (ref 0.0–5.0)
HCT: 38.4 % (ref 36.0–46.0)
Hemoglobin: 13 g/dL (ref 12.0–15.0)
Lymphocytes Relative: 26.3 % (ref 12.0–46.0)
Lymphs Abs: 2 10*3/uL (ref 0.7–4.0)
MCHC: 33.9 g/dL (ref 30.0–36.0)
MCV: 101.2 fl — ABNORMAL HIGH (ref 78.0–100.0)
Monocytes Absolute: 0.6 10*3/uL (ref 0.1–1.0)
Monocytes Relative: 8.2 % (ref 3.0–12.0)
Neutro Abs: 4.9 10*3/uL (ref 1.4–7.7)
Neutrophils Relative %: 63.3 % (ref 43.0–77.0)
Platelets: 362 10*3/uL (ref 150.0–400.0)
RBC: 3.79 Mil/uL — ABNORMAL LOW (ref 3.87–5.11)
RDW: 12.9 % (ref 11.5–15.5)
WBC: 7.7 10*3/uL (ref 4.0–10.5)

## 2021-05-10 LAB — MAGNESIUM: Magnesium: 1.8 mg/dL (ref 1.5–2.5)

## 2021-05-10 NOTE — Assessment & Plan Note (Signed)
Pt improved with PT in patient--  Home health PT will be coming to her home starting tomorrow

## 2021-05-10 NOTE — Patient Instructions (Signed)
Cirrhosis Cirrhosis is long-term (chronic) liver injury. The liver is the body's largest internal organ, and it performs many functions. It converts food into energy, removes toxic material from the blood, makes important proteins, and absorbs necessary vitamins from food. In cirrhosis, healthy liver cells are replaced by scar tissue. This prevents blood from flowing through the liver and makes it difficult for the liver to complete its functions. What are the causes? Common causes of this condition are hepatitis C and long-term alcohol abuse. Other causes include: Nonalcoholic fatty liver disease (NAFLD). This happens when fat is deposited in the liver by causes other than alcohol. Hepatitis B infection. Autoimmune hepatitis. In this condition, the body's defense system (immune system) mistakenly attacks the liver cells, causing inflammation. Diseases that cause blockage of ducts inside the liver. Inherited liver diseases, such as hemochromatosis. This is one of the most common inherited liver diseases. In this disease, deposits of iron collect in the liver and other organs. Reactions to certain long-term medicines, such as amiodarone, a heart medicine. Parasitic infections. These include schistosomiasis, which is caused by a flatworm. Long-term contact to certain toxins. These toxins include certain organic solvents, such as toluene and chloroform. What increases the risk? You are more likely to develop this condition if: You have certain types of viral hepatitis. You abuse alcohol, especially if you are female. You are overweight. You use IV drugs and share needles. You have unprotected sex with someone who has viral hepatitis. What are the signs or symptoms? You may not have any signs and symptoms at first. Symptoms may not develop until the damage to your liver starts to get worse. Early symptoms may include: Weakness and tiredness (fatigue). Changes in sleep patterns or having trouble  sleeping. Itchiness. Tenderness in the right-upper part of your abdomen. Weight loss and muscle loss. Nausea. Loss of appetite. Later symptoms may include: Fatigue or weakness that is getting worse. Yellow skin and eyes (jaundice). Buildup of fluid in the abdomen (ascites). You may notice that your clothes are tight around your waist. Weight gain and swelling of the feet and ankles (edema). Trouble breathing. Easy bruising and bleeding. Vomiting blood, or black or bloody stool. Mental confusion. How is this diagnosed? Your health care provider may suspect cirrhosis based on your symptoms and medical history, especially if you have other medical conditions or a history of alcohol abuse. Your health care provider will do a physical exam to feel your liver and to check for signs of cirrhosis. Tests may include: Blood tests to check: For hepatitis B or C. Kidney function. Liver function. Imaging tests such as: MRI or CT scan to look for changes seen in advanced cirrhosis. Ultrasound to see if normal liver tissue is being replaced by scar tissue. A procedure in which a long needle is used to take a sample of liver tissue to be checked in a lab (biopsy). Liver biopsy can confirm the diagnosis of cirrhosis. How is this treated? Treatment for this condition depends on how damaged your liver is and what caused the damage. It may include treating the symptoms of cirrhosis, or treating the underlying causes to slow the damage. Treatment may include: Making lifestyle changes, such as: Eating a healthy diet. You may need to work with your health care provider or a dietitian to develop an eating plan. Restricting salt intake. Maintaining a healthy weight. Not abusing drugs or alcohol. Taking medicines to: Treat liver infections or other infections. Control itching. Reduce fluid buildup. Reduce certain blood toxins.  Reduce risk of bleeding from enlarged blood vessels in the stomach or esophagus  (varices). Liver transplant. In this procedure, a liver from a donor is used to replace your diseased liver. This is done if cirrhosis has caused liver failure. Other treatments and procedures may be done depending on the problems that you get from cirrhosis. Common problems include liver-related kidney failure (hepatorenal syndrome). Follow these instructions at home:  Take medicines only as told by your health care provider. Do not use medicines that are toxic to your liver. Ask your health care provider before taking any new medicines, including over-the-counter medicines such as NSAIDs. Rest as needed. Eat a well-balanced diet. Limit your salt or water intake, if your health care provider asks you to do this. Do not drink alcohol. This is especially important if you routinely take acetaminophen. Keep all follow-up visits. This is important. Contact a health care provider if you: Have fatigue or weakness that is getting worse. Develop swelling of the hands, feet, or legs, or a buildup of fluid in the abdomen (ascites). Have a fever or chills. Develop loss of appetite. Have nausea or vomiting. Develop jaundice. Develop easy bruising or bleeding. Get help right away if you: Vomit bright red blood or a material that looks like coffee grounds. Have blood in your stools. Notice that your stools appear black and tarry. Become confused. Have chest pain or trouble breathing. These symptoms may represent a serious problem that is an emergency. Do not wait to see if the symptoms will go away. Get medical help right away. Call your local emergency services (911 in the [place] Do not drive yourself to the hospital. Summary Cirrhosis is chronic liver injury. Common causes are hepatitis C and long-term alcohol abuse. Tests used to diagnose cirrhosis include blood tests, imaging tests, and liver biopsy. Treatment for this condition involves treating the underlying cause. Avoid alcohol, drugs, salt,  and medicines that may damage your liver. Get help right away if you vomit bright red blood or a material that looks like coffee grounds. This information is not intended to replace advice given to you by your health care provider. Make sure you discuss any questions you have with your health care provider. Document Revised: 04/08/2020 Document Reviewed: 04/08/2020 Elsevier Patient Education  [place].

## 2021-05-10 NOTE — Assessment & Plan Note (Signed)
Well controlled, no changes to meds. Encouraged heart healthy diet such as the DASH diet and exercise as tolerated.  °

## 2021-05-10 NOTE — Progress Notes (Signed)
Subjective:   By signing my name below, I, Shehryar Baig, attest that this documentation has been prepared under the direction and in the presence of Dr. Roma Schanz, DO. 05/10/2021     Patient ID: [name], female    DOB: 1956/02/15, 65 y.o.   MRN: [id number]  Chief Complaint  Patient presents with   Alchoholic Cirrhosis   Follow-up    HPI Patient is in today for a hospital follow up. She is present with her friend during this visit.   She was admitted to the hospital on 03/21/2021 to 04/08/2021 for alcohol abuse and hyponatremia. She was recommended to follow up with her PCP in 1-2 weeks and complete a BMP/CBC in 1 week.   Physical therapy- She reports increased strength while in physical therapy. She is responding well to physical therapy. She finds it difficult to go up the stairs so she is currently sleeping downstairs. Her daughter is planning to move her to a house with one story for better acceptability. Her mobility is permanently limited. She cannot move around independently at this time.  Counseling- She has not set up an appointment with a counselor to help manage her alcohol abuse. She notes that she will have difficulty seeing one due to limited mobility and lack of transportation. She was given recommendations to behavior heath specialists.  Alcohol- She is no longer drinking alcohol at this time.   Magnesium- She does not think she is taking magnesium anymore. GI- She has an upcomming appointment with a GI specialist.   Nephrology- She has not had a follow up appointment with a nephrologist since her hospital discharge.  Blood pressure- She continues taking 40 mg lisinopril daily PO, 100 mg metoprolol succinate daily PO and reports no new issues while taking it.   BP Readings from Last 3 Encounters:  05/10/21 (!) 144/78  04/08/21 (!) 140/91  03/18/21 (!) 142/68   Pulse Readings from Last 3 Encounters:  05/10/21 78  04/08/21 71  03/18/21 67    Immunizations- She is UTD on flu vaccines.    Past Medical History:  Diagnosis Date   Anxiety    Cancer ([facility]) 04/2014   squamous cell carcinoma L inner thigh   Depression    Hard of hearing    Hypertension    Osteopenia    Substance abuse ([place])    TIA (transient ischemic attack) 2016    Past Surgical History:  Procedure Laterality Date   AUGMENTATION MAMMAPLASTY Bilateral 1988   BREAST ENHANCEMENT SURGERY     BUNIONECTOMY     GUM SURGERY  11/2017   NOSE SURGERY     Epistaxis   TONSILLECTOMY      Family History  Problem Relation Age of Onset   Hypertension Mother    Kidney disease Mother        dialysis   Alzheimer's disease Mother    Hyperlipidemia Father    Stroke Paternal Grandmother    Rheum arthritis Paternal Grandmother    Breast cancer Neg Hx    Colon cancer Neg Hx    Esophageal cancer Neg Hx    Rectal cancer Neg Hx    Stomach cancer Neg Hx     Social History   Socioeconomic History   Marital status: Divorced    Spouse name: Not on file   Number of children: Not on file   Years of education: Not on file   Highest education level: Not on file  Occupational History   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  Tobacco Use   Smoking status: Never   Smokeless tobacco: Never  Vaping Use   Vaping Use: Never used  Substance and Sexual Activity   Alcohol use: Yes    Alcohol/week: 4.0 - 6.0 standard drinks    Types: 4 - 6 Cans of beer per week    Comment: 3 times a week, states hx of heavy use 15 years ago   Drug use: No   Sexual activity: Not Currently    Partners: Male  Other Topics Concern   Not on file  Social History Narrative   Exercise--- gym 6x a week   Social Determinants of Health   Financial Resource Strain: Not on file  Food Insecurity: Not on file  Transportation Needs: Not on file  Physical Activity: Not on file  Stress: Not on file  Social Connections: Not on file  Intimate Partner Violence: Not on file    Outpatient  Medications Prior to Visit  Medication Sig Dispense Refill   folic acid (FOLVITE) 1 MG tablet Take 1 tablet (1 mg total) by mouth daily.     lisinopril (ZESTRIL) 40 MG tablet TAKE 1 TABLET BY MOUTH EVERY DAY 90 tablet 1   magnesium oxide (MAG-OX) 400 (240 Mg) MG tablet Take 1 tablet (400 mg total) by mouth 2 (two) times daily.     melatonin 5 MG TABS Take 1 tablet (5 mg total) by mouth at bedtime as needed.  0   metoprolol succinate (TOPROL-XL) 100 MG 24 hr tablet Take 1 tablet (100 mg total) by mouth daily. Take with or immediately following a meal. 90 tablet 1   Multiple Vitamin (MULTIVITAMIN WITH MINERALS) TABS tablet Take 1 tablet by mouth daily.     pantoprazole (PROTONIX) 40 MG tablet Take 1 tablet (40 mg total) by mouth daily.     tamsulosin (FLOMAX) 0.4 MG CAPS capsule Take 1 capsule (0.4 mg total) by mouth daily. 30 capsule    urea (URE-NA) 15 g PACK oral packet Take 15 g by mouth daily. TAKE IT UNTIL YOU HAVE CONSISTENT SOLUTE INTAKE then STOP it once eating food well.     No facility-administered medications prior to visit.    Allergies  Allergen Reactions   Codeine Other (See Comments)    REACTION: insomnia and anxious    Review of Systems  Constitutional:  Positive for weight loss. Negative for chills, fever and malaise/fatigue.  HENT:  Negative for congestion and hearing loss.   Eyes:  Negative for discharge.  Respiratory:  Negative for cough, sputum production and shortness of breath.   Cardiovascular:  Negative for chest pain, palpitations and leg swelling.  Gastrointestinal:  Negative for abdominal pain, blood in stool, constipation, diarrhea, heartburn, nausea and vomiting.  Genitourinary:  Negative for dysuria, frequency, hematuria and urgency.  Musculoskeletal:  Negative for back pain, falls and myalgias.  Skin:  Negative for rash.  Neurological:  Positive for weakness. Negative for dizziness, sensory change, loss of consciousness and headaches.   Endo/Heme/Allergies:  Negative for environmental allergies. Does not bruise/bleed easily.  Psychiatric/Behavioral:  Negative for depression and suicidal ideas. The patient is not nervous/anxious and does not have insomnia.       Objective:    Physical Exam Constitutional:      General: She is not in acute distress.    Appearance: Normal appearance. She is not ill-appearing.  HENT:     Head: Normocephalic and atraumatic.     Right Ear: External ear normal.  Left Ear: External ear normal.  Eyes:     Extraocular Movements: Extraocular movements intact.     Pupils: Pupils are equal, round, and reactive to light.  Cardiovascular:     Rate and Rhythm: Normal rate and regular rhythm.     Heart sounds: Normal heart sounds. No murmur heard.   No gallop.  Pulmonary:     Effort: Pulmonary effort is normal. No respiratory distress.     Breath sounds: Normal breath sounds. No wheezing or rales.  Skin:    General: Skin is warm and dry.  Neurological:     Mental Status: She is alert and oriented to person, place, and time.  Psychiatric:        Behavior: Behavior normal.        Judgment: Judgment normal.    BP (!) 144/78 (BP Location: Left Arm, Patient Position: Sitting, Cuff Size: Small)   Pulse 78   Temp 97.8 F (36.6 C) (Oral)   Resp 16   Ht 5' (1.524 m)   Wt 104 lb 3.2 oz (47.3 kg)   SpO2 97%   BMI 20.35 kg/m  Wt Readings from Last 3 Encounters:  05/10/21 104 lb 3.2 oz (47.3 kg)  03/28/21 110 lb 3.7 oz (50 kg)  03/18/21 114 lb 3.2 oz (51.8 kg)    Diabetic Foot Exam - Simple   No data filed    Lab Results  Component Value Date   WBC 9.5 04/07/2021   HGB 11.8 (L) 04/07/2021   HCT 32.2 (L) 04/07/2021   PLT 432 (H) 04/07/2021   GLUCOSE 99 04/08/2021   CHOL 233 (H) 03/18/2021   TRIG 119.0 03/18/2021   HDL 81.70 03/18/2021   LDLDIRECT 127.6 03/28/2013   LDLCALC 127 (H) 03/18/2021   ALT 30 03/27/2021   AST 33 03/27/2021   NA 129 (L) 04/08/2021   K 3.6 04/08/2021    CL 98 04/08/2021   CREATININE 0.96 04/08/2021   BUN 19 04/08/2021   CO2 23 04/08/2021   TSH 1.282 03/22/2021   INR 1.0 03/22/2021   HGBA1C 5.2 06/14/2015   MICROALBUR 1.0 05/29/2014    Lab Results  Component Value Date   TSH 1.282 03/22/2021   Lab Results  Component Value Date   WBC 9.5 04/07/2021   HGB 11.8 (L) 04/07/2021   HCT 32.2 (L) 04/07/2021   MCV 100.3 (H) 04/07/2021   PLT 432 (H) 04/07/2021   Lab Results  Component Value Date   NA 129 (L) 04/08/2021   K 3.6 04/08/2021   CO2 23 04/08/2021   GLUCOSE 99 04/08/2021   BUN 19 04/08/2021   CREATININE 0.96 04/08/2021   BILITOT 0.9 03/27/2021   ALKPHOS 55 03/27/2021   AST 33 03/27/2021   ALT 30 03/27/2021   PROT 5.9 (L) 03/27/2021   ALBUMIN 3.0 (L) 04/08/2021   CALCIUM 8.9 04/08/2021   ANIONGAP 8 04/08/2021   GFR 92.51 03/18/2021   Lab Results  Component Value Date   CHOL 233 (H) 03/18/2021   Lab Results  Component Value Date   HDL 81.70 03/18/2021   Lab Results  Component Value Date   LDLCALC 127 (H) 03/18/2021   Lab Results  Component Value Date   TRIG 119.0 03/18/2021   Lab Results  Component Value Date   CHOLHDL 3 03/18/2021   Lab Results  Component Value Date   HGBA1C 5.2 06/14/2015       Assessment & Plan:   Problem List Items Addressed This Visit  Unprioritized   Alcoholic cirrhosis ([place]) - Primary    Pt has stopped drinking  Will recheck labs today Pt given names and numbers for counseling  She will also look into AA Pt will see GI as well      Relevant Orders   Basic metabolic panel   CBC with Differential/Platelet   Magnesium   Essential hypertension    Well controlled, no changes to meds. Encouraged heart healthy diet such as the DASH diet and exercise as tolerated.       Weakness    Pt improved with PT in patient--  Home health PT will be coming to her home starting tomorrow         No orders of the defined types were placed in this encounter.   I,  Dr. Roma Schanz, DO, personally preformed the services described in this documentation.  All medical record entries made by the scribe were at my direction and in my presence.  I have reviewed the chart and discharge instructions (if applicable) and agree that the record reflects my personal performance and is accurate and complete. 05/10/2021   I,Shehryar Baig,acting as a scribe for Ann Held, DO.,have documented all relevant documentation on the behalf of Ann Held, DO,as directed by  Ann Held, DO while in the presence of Ann Held, DO.   Ann Held, DO

## 2021-05-10 NOTE — Assessment & Plan Note (Addendum)
Pt has stopped drinking  Will recheck labs today Pt given names and numbers for counseling  She will also look into AA Pt will see GI as well

## 2021-05-13 ENCOUNTER — Other Ambulatory Visit: Payer: Self-pay | Admitting: Family Medicine

## 2021-05-13 DIAGNOSIS — E871 Hypo-osmolality and hyponatremia: Secondary | ICD-10-CM

## 2021-05-16 ENCOUNTER — Ambulatory Visit: Payer: BC Managed Care – PPO | Admitting: *Deleted

## 2021-05-16 ENCOUNTER — Other Ambulatory Visit: Payer: Self-pay | Admitting: Family Medicine

## 2021-05-16 DIAGNOSIS — F1010 Alcohol abuse, uncomplicated: Secondary | ICD-10-CM

## 2021-05-16 DIAGNOSIS — F10930 Alcohol use, unspecified with withdrawal, uncomplicated: Secondary | ICD-10-CM

## 2021-05-16 DIAGNOSIS — F32A Depression, unspecified: Secondary | ICD-10-CM

## 2021-05-16 DIAGNOSIS — I10 Essential (primary) hypertension: Secondary | ICD-10-CM

## 2021-05-16 DIAGNOSIS — I639 Cerebral infarction, unspecified: Secondary | ICD-10-CM

## 2021-05-16 DIAGNOSIS — F1011 Alcohol abuse, in remission: Secondary | ICD-10-CM

## 2021-05-16 DIAGNOSIS — F419 Anxiety disorder, unspecified: Secondary | ICD-10-CM

## 2021-05-16 DIAGNOSIS — K7030 Alcoholic cirrhosis of liver without ascites: Secondary | ICD-10-CM

## 2021-05-16 DIAGNOSIS — F322 Major depressive disorder, single episode, severe without psychotic features: Secondary | ICD-10-CM

## 2021-05-16 DIAGNOSIS — E871 Hypo-osmolality and hyponatremia: Secondary | ICD-10-CM

## 2021-05-16 DIAGNOSIS — F418 Other specified anxiety disorders: Secondary | ICD-10-CM

## 2021-05-16 DIAGNOSIS — G459 Transient cerebral ischemic attack, unspecified: Secondary | ICD-10-CM

## 2021-05-16 DIAGNOSIS — R531 Weakness: Secondary | ICD-10-CM

## 2021-05-16 NOTE — Patient Instructions (Signed)
Visit Information   PATIENT GOALS/PLAN OF CARE:  Care Plan : LCSW Plan of Care  Updates made by Francis Gaines, LCSW since 05/16/2021 12:00 AM     Problem: Refrain from Drinking Alcohol.   Priority: High     Goal: Refrain from Drinking Alcohol.   Start Date: 05/16/2021  Expected End Date: 07/15/2021  This Visit's Progress: On track  Priority: High  Note:   Current Barriers:  Patient with Alcoholic Cirrhosis of Liver, Alcohol Use and Abuse Disorder, Alcohol Withdrawal and Dependence, Depression and Anxiety, needs support, education, resources, referrals, advocacy and care coordination, in order to meet unmet mental health and substance abuse needs. Patient appears to be in the pre-contemplative stage of change, denying the need for counseling and supportive services for unmet mental health and substance abuse needs.   Clinical Goal(s):  Patient will reduce self-medicating behavior and increase ability of:  coping skills, healthy habits, and self-management skills. Interventions:  Collaboration with Primary Care Physician, Dr. Roma Schanz regarding development and update of comprehensive plan of care as evidenced by provider attestation and co-signature. Collaboration with Primary Care Physician, Dr. Roma Schanz to request order for home health physical therapy services, per patient's request. Inter-disciplinary care team collaboration (see longitudinal plan of care). Explored community resource options, mailing patient a complete list of inpatient and outpatient substance abuse treatment programs, as well as a Metall[employer] Schedule.   Patient verbalized basic understanding of Alcohol Use and Abuse Disorder, denying the need for counseling and supportive services, of any kind, at this time.   Discussed previous treatment for Alcohol Use and Abuse Disorder, and what behaviors patient would like to change. Assessed patient's over all care  coordination needs and provided basic disease education.  Clinical Interventions: Patient interviewed and appropriate assessments performed. Provided mental health counseling with regards to Alcohol Use and Abuse, as well as Depression and Anxiety.   Discussed plans with patient for ongoing care management follow-up and provided patient with direct contact information for care management team. Assisted patient with obtaining information about health plan benefits. PHQ2 and PHQ9 Depression Screenings completed and results reviewed with patient. Assessed for Suicidal Ideation/Homicidal Ideation:  none present. Solution-Focused Strategies employed, Active Listening/Reflection utilized, Engineer, petroleum provided, Verbalization of Feelings encouraged, Mental Health       Medications reviewed and Compliance encouraged, Quality of Sleep assessed and Sleep Hygiene Techniques promoted, Participation in Counseling and        Support Group Involvement encouraged.  Discussed referral to [facility] to assist with connecting to mental health provider for ongoing counseling and supportive services, but patient denied       interest. Patient Goals/Self-Care Activities:  Begin personal counseling with LCSW, on a weekly/bi-weekly basis, to obtain resources and services for alcohol use and abuse, and reduce symptoms of Depression and Anxiety. Implement interventions discussed: Avoid people and places where alcohol is used. Develop a plan to avoid relapse. Join Alcoholics Anonymous and establish services with a sponsor.   Identify triggers for relapse and make a plan to deal with triggers, such as holidays, anniversaries, etc. Learn how to handle negative feelings without alcohol use. Make a list of pleasurable things to do without using alcohol. LCSW collaboration with Wallace Keller, representative with [facility] [phone]), to confirm order for home health physical therapy services. Begin  attending in-person, as well as virtual Alcoholics Anonymous Meetings, from the list provided. Continue to consider  counseling and supportive services for symptoms of Depression and Anxiety. Contact LCSW directly (# [id number]) if you have questions, need assistance, or if additional social work needs are identified between now and our next scheduled telephone outreach call. Follow-Up Plan:  05/30/2021 at 3:15pm                   Consent to CCM Services: Ms. Durango was given information about Chronic Care Management services including:  CCM service includes personalized support from designated clinical staff supervised by her physician, including individualized plan of care and coordination with other care providers 24/7 contact phone numbers for assistance for urgent and routine care needs. Service will only be billed when office clinical staff spend 20 minutes or more in a month to coordinate care. Only one practitioner may furnish and bill the service in a calendar month. The patient may stop CCM services at any time (effective at the end of the month) by phone call to the office staff. The patient will be responsible for cost sharing (co-pay) of up to 20% of the service fee (after annual deductible is met).  Patient agreed to services and verbal consent obtained.   Patient verbalizes understanding of instructions provided today and agrees to view in [place].   Telephone follow up appointment with care management team member scheduled for:  05/30/2021 at 3:15pm  [place] Licensed Clinical Social Worker [place] [phone]

## 2021-05-16 NOTE — Chronic Care Management (AMB) (Signed)
Care Management Clinical Social Work Note  05/16/2021 Name: [name] MRN: [id number] DOB: July 01, 1956  [name] is a 65 y.o. year old female who is a primary care patient of Ann Held, DO.  The Care Management team was consulted for assistance with chronic disease management and coordination needs.  Engaged with patient by telephone for initial visit in response to provider referral for social work chronic care management and care coordination services  Consent to Services:  [name] was given information about Care Management services today including:  Care Management services includes personalized support from designated clinical staff supervised by her physician, including individualized plan of care and coordination with other care providers 24/7 contact phone numbers for assistance for urgent and routine care needs. The patient may stop case management services at any time by phone call to the office staff.  Patient agreed to services and consent obtained.   Assessment: Review of patient past medical history, allergies, medications, and health status, including review of relevant consultants reports was performed today as part of a comprehensive evaluation and provision of chronic care management and care coordination services.  SDOH (Social Determinants of Health) assessments and interventions performed:  SDOH Interventions    Flowsheet Row Most Recent Value  SDOH Interventions   Food Insecurity Interventions Intervention Not Indicated  Financial Strain Interventions Intervention Not Indicated  Housing Interventions Intervention Not Indicated  Intimate Partner Violence Interventions Intervention Not Indicated  Physical Activity Interventions Intervention Not Indicated  Stress Interventions Offered [employer] Resources, Provide Counseling, Patient Refused  Social Connections Interventions Intervention Not Indicated, Patient Refused   Transportation Interventions Intervention Not Indicated        Advanced Directives Status: See Care Plan for related entries.  Care Plan  Allergies  Allergen Reactions   Codeine Other (See Comments)    REACTION: insomnia and anxious    Outpatient Encounter Medications as of 05/16/2021  Medication Sig   folic acid (FOLVITE) 1 MG tablet Take 1 tablet (1 mg total) by mouth daily.   lisinopril (ZESTRIL) 40 MG tablet TAKE 1 TABLET BY MOUTH EVERY DAY   magnesium oxide (MAG-OX) 400 (240 Mg) MG tablet Take 1 tablet (400 mg total) by mouth 2 (two) times daily.   melatonin 5 MG TABS Take 1 tablet (5 mg total) by mouth at bedtime as needed.   metoprolol succinate (TOPROL-XL) 100 MG 24 hr tablet Take 1 tablet (100 mg total) by mouth daily. Take with or immediately following a meal.   Multiple Vitamin (MULTIVITAMIN WITH MINERALS) TABS tablet Take 1 tablet by mouth daily.   pantoprazole (PROTONIX) 40 MG tablet Take 1 tablet (40 mg total) by mouth daily.   tamsulosin (FLOMAX) 0.4 MG CAPS capsule Take 1 capsule (0.4 mg total) by mouth daily.   urea (URE-NA) 15 g PACK oral packet Take 15 g by mouth daily. TAKE IT UNTIL YOU HAVE CONSISTENT SOLUTE INTAKE then STOP it once eating food well.   No facility-administered encounter medications on file as of 05/16/2021.    Patient Active Problem List   Diagnosis Date Noted   Weakness 05/10/2021   Gait disturbance 03/22/2021   Falls frequently 03/22/2021   Alcohol abuse 55/73/2202   Alcoholic cirrhosis ([place]) 54/27/0623   Ataxia 03/22/2020   Alcohol withdrawal ([place]) 10/09/2015   Nausea, vomiting and diarrhea 10/07/2015   Diarrhea 10/07/2015   Anxiety and depression    Acute ischemic stroke ([place])    Left leg weakness 06/13/2015   TIA (transient  ischemic attack) 06/13/2015   Hyponatremia 06/13/2015   Cornea scar 09/24/2012   H/O corneal ulcer 09/24/2012   Bronchitis 06/14/2012   Error, refractive, myopia 02/29/2012   Presbyopia 01/25/2012   H/O  ETOH abuse 09/13/2011   DYSURIA 10/22/2009   PROTEINURIA, MILD 04/21/2009   ABNORMAL FINDINGS GI TRACT 12/03/2007   Essential hypertension 10/22/2007    Conditions to be addressed/monitored: Anxiety and Depression.  Limited Social Support, Mental Health Concerns, Family and Relationship Dysfunction, Alcohol Abuse Issues, Social Isolation, and Lacks Knowledge of [employer].  Care Plan : LCSW Plan of Care  Updates made by Francis Gaines, LCSW since 05/16/2021 12:00 AM     Problem: Refrain from Drinking Alcohol.   Priority: High     Goal: Refrain from Drinking Alcohol.   Start Date: 05/16/2021  Expected End Date: 07/15/2021  This Visit's Progress: On track  Priority: High  Note:   Current Barriers:  Patient with Alcoholic Cirrhosis of Liver, Alcohol Use and Abuse Disorder, Alcohol Withdrawal and Dependence, Depression and Anxiety, needs support, education, resources, referrals, advocacy and care coordination, in order to meet unmet mental health and substance abuse needs. Patient appears to be in the pre-contemplative stage of change, denying the need for counseling and supportive services for unmet mental health and substance abuse needs.   Clinical Goal(s):  Patient will reduce self-medicating behavior and increase ability of:  coping skills, healthy habits, and self-management skills. Interventions:  Collaboration with Primary Care Physician, Dr. Roma Schanz regarding development and update of comprehensive plan of care as evidenced by provider attestation and co-signature. Collaboration with Primary Care Physician, Dr. Roma Schanz to request order for home health physical therapy services, per patient's request. Inter-disciplinary care team collaboration (see longitudinal plan of care). Explored community resource options, mailing patient a complete list of inpatient and outpatient substance abuse treatment programs, as well as a Multimedia programmer Schedule.   Patient verbalized basic understanding of Alcohol Use and Abuse Disorder, denying the need for counseling and supportive services, of any kind, at this time.   Discussed previous treatment for Alcohol Use and Abuse Disorder, and what behaviors patient would like to change. Assessed patient's over all care coordination needs and provided basic disease education.  Clinical Interventions: Patient interviewed and appropriate assessments performed. Provided mental health counseling with regards to Alcohol Use and Abuse, as well as Depression and Anxiety.   Discussed plans with patient for ongoing care management follow-up and provided patient with direct contact information for care management team. Assisted patient with obtaining information about health plan benefits. PHQ2 and PHQ9 Depression Screenings completed and results reviewed with patient. Assessed for Suicidal Ideation/Homicidal Ideation:  none present. Solution-Focused Strategies employed, Active Listening/Reflection utilized, Engineer, petroleum provided, Verbalization of Feelings encouraged, Mental Health       Medications reviewed and Compliance encouraged, Quality of Sleep assessed and Sleep Hygiene Techniques promoted, Participation in Counseling and        Support Group Involvement encouraged.  Discussed referral to [facility] to assist with connecting to mental health provider for ongoing counseling and supportive services, but patient denied       interest. Patient Goals/Self-Care Activities:  Begin personal counseling with LCSW, on a weekly/bi-weekly basis, to obtain resources and services for alcohol use and abuse, and reduce symptoms of Depression and Anxiety. Implement interventions discussed: Avoid people and places where alcohol is used. Develop a plan to avoid relapse. Join Alcoholics Anonymous  and establish services with a sponsor.   Identify triggers for relapse and make a plan to deal  with triggers, such as holidays, anniversaries, etc. Learn how to handle negative feelings without alcohol use. Make a list of pleasurable things to do without using alcohol. LCSW collaboration with Wallace Keller, representative with [facility] [phone]), to confirm order for home health physical therapy services. Begin attending in-person, as well as virtual Alcoholics Anonymous Meetings, from the list provided. Continue to consider counseling and supportive services for symptoms of Depression and Anxiety. Contact LCSW directly (# [id number]) if you have questions, need assistance, or if additional social work needs are identified between now and our next scheduled telephone outreach call. Follow-Up Plan:  05/30/2021 at 3:15pm   [facility] Licensed Clinical Social Worker Weed Concepcion [phone]

## 2021-05-19 ENCOUNTER — Telehealth: Payer: Self-pay | Admitting: Family Medicine

## 2021-05-19 DIAGNOSIS — K7030 Alcoholic cirrhosis of liver without ascites: Secondary | ICD-10-CM | POA: Diagnosis not present

## 2021-05-19 DIAGNOSIS — D539 Nutritional anemia, unspecified: Secondary | ICD-10-CM | POA: Diagnosis not present

## 2021-05-19 DIAGNOSIS — R260 Ataxic gait: Secondary | ICD-10-CM | POA: Diagnosis not present

## 2021-05-19 DIAGNOSIS — F419 Anxiety disorder, unspecified: Secondary | ICD-10-CM | POA: Diagnosis not present

## 2021-05-19 DIAGNOSIS — F1020 Alcohol dependence, uncomplicated: Secondary | ICD-10-CM | POA: Diagnosis not present

## 2021-05-19 DIAGNOSIS — F32A Depression, unspecified: Secondary | ICD-10-CM | POA: Diagnosis not present

## 2021-05-19 DIAGNOSIS — E538 Deficiency of other specified B group vitamins: Secondary | ICD-10-CM | POA: Diagnosis not present

## 2021-05-19 DIAGNOSIS — R339 Retention of urine, unspecified: Secondary | ICD-10-CM | POA: Diagnosis not present

## 2021-05-19 DIAGNOSIS — M6281 Muscle weakness (generalized): Secondary | ICD-10-CM | POA: Diagnosis not present

## 2021-05-19 DIAGNOSIS — M25511 Pain in right shoulder: Secondary | ICD-10-CM | POA: Diagnosis not present

## 2021-05-19 DIAGNOSIS — I10 Essential (primary) hypertension: Secondary | ICD-10-CM | POA: Diagnosis not present

## 2021-05-19 DIAGNOSIS — Z9181 History of falling: Secondary | ICD-10-CM | POA: Diagnosis not present

## 2021-05-19 DIAGNOSIS — G8929 Other chronic pain: Secondary | ICD-10-CM | POA: Diagnosis not present

## 2021-05-20 ENCOUNTER — Encounter: Payer: Self-pay | Admitting: Nurse Practitioner

## 2021-05-20 ENCOUNTER — Other Ambulatory Visit: Payer: BC Managed Care – PPO

## 2021-05-20 ENCOUNTER — Ambulatory Visit: Payer: BC Managed Care – PPO

## 2021-05-20 ENCOUNTER — Ambulatory Visit (INDEPENDENT_AMBULATORY_CARE_PROVIDER_SITE_OTHER): Payer: BC Managed Care – PPO | Admitting: Nurse Practitioner

## 2021-05-20 VITALS — BP 118/70 | HR 72 | Ht 59.25 in | Wt 104.0 lb

## 2021-05-20 DIAGNOSIS — K7030 Alcoholic cirrhosis of liver without ascites: Secondary | ICD-10-CM

## 2021-05-20 NOTE — Patient Instructions (Addendum)
IMAGING: You will be contacted by [facility] (Your caller ID will indicate phone # [phone]) in the next 7 days to schedule your Abdominal MRI. If you have not heard from them within 7 business days, please call [facility] at [phone] to follow up on the status of your appointment.    LABS:  Lab work has been ordered for you today. Our lab is located in the basement. Press "B" on the elevator. The lab is located at the first door on the left as you exit the elevator.  HEALTHCARE LAWS AND MY CHART RESULTS: Due to recent changes in healthcare laws, you may see the results of your imaging and laboratory studies on MyChart before your provider has had a chance to review them.   We understand that in some cases there may be results that are confusing or concerning to you. Not all laboratory results come back in the same time frame and the provider may be waiting for multiple results in order to interpret others.  Please give [place] 48 hours in order for your provider to thoroughly review all the results before contacting the office for clarification of your results.   RECOMMENDATIONS: Follow up in 6 months. Our office will send you a reminder. Boost or Ensure 1 bottle a day. No alcohol ever.  It was great seeing you today! Thank you for entrusting me with your care and choosing [facility].  Noralyn Pick, CRNP

## 2021-05-20 NOTE — Progress Notes (Signed)
05/20/2021 [name] [id number] 1956-06-29   Chief Complaint:  Cirrhosis follow up  History of Present Illness: 65 year old female with a history of hypertension, TIA, anxiety, depression and alcohol associated cirrhosis. She is followed by Dr. Ardis Hughs. She was admitted to the hospital on 03/21/2021 due to altered mental status, generalized weakness with recent falls in the setting of continued alcohol use. She had significant hyponatremia ( Na+ 130-> 122). Alcohol level 108. Ammonia 12. She was evaluated by nephrology and she received Tolvaptan x 1 dose, NS IV and Ure-Na tablets and NS IV and her sodium level stabilized. There was some concern she might have Wernicke's encephalopathy. She subsequently underwent a brain MRI which did not show any evidence of Wernicke's encephalopathy. CIWA protocol was maintained, she had mild tremors without overt DTs. She was discharged to the SNF on 04/08/2021.   She presents to our office today for cirrhosis follow up. She is accompanied by her advocate/friend. She was discharged from the SNF to home a few weeks ago. She has home OT and PT. She remains abstinent from alcohol since her hospital admission date 03/25/2021. Prior to her hospital admission she was drinking 4 beers daily. She planned on entering [place] on the same day of her hospital admission. She intends to join AA. She continues to have generalized weakness which has slowly improved. She is eating 3 meals daily. Most of her meals are delivered to her home. She denies having any confusion. No N/V. No upper or lower abdominal pain. No CP or SOB. She is passing a normal BM daily. No rectal bleeding or black stools. She underwent a surveillance abdominal sonogram on 03/18/2021 which showed hepatic steatosis with mildly nodular liver contours suggestive of cirrhosis, no ascites and a dilated CBD measuring 56m. She had a follow up appointment with Dr. JArdis Hughsto further  discuss these results but she missed this appointment due to her hospital admission. An abdominal Mri/MRCP was recommended to further evaluate her dilated CBD. Her last AFP level was 6.1 on 05/19/2020. She underwent an EGD on 07/28/2020 which identified reactive gastritis, no evidence of esophageal/gastric varices or portal hypertension.   CBC Latest Ref Rng & Units 05/10/2021 04/07/2021 03/27/2021  WBC 4.0 - 10.5 K/uL 7.7 9.5 6.4  Hemoglobin 12.0 - 15.0 g/dL 13.0 11.8(L) 12.9  Hematocrit 36.0 - 46.0 % 38.4 32.2(L) 35.9(L)  Platelets 150.0 - 400.0 K/uL 362.0 432(H) 263    CMP Latest Ref Rng & Units 05/10/2021 04/08/2021 04/07/2021  Glucose 70 - 99 mg/dL 73 99 95  BUN 6 - 23 mg/dL 12 19 29(H)  Creatinine 0.40 - 1.20 mg/dL 0.70 0.96 0.67  Sodium 135 - 145 mEq/L 128(L) 129(L) 129(L)  Potassium 3.5 - 5.1 mEq/L 4.7 3.6 3.6  Chloride 96 - 112 mEq/L 92(L) 98 97(L)  CO2 19 - 32 mEq/L _0 Calcium 8.4 - 10.5 mg/dL 10.1 8.9 9.3  Total Protein 6.5 - 8.1 g/dL - - -  Total Bilirubin 0.3 - 1.2 mg/dL - - -  Alkaline Phos 38 - 126 U/L - - -  AST 15 - 41 U/L - - -  ALT 0 - 44 U/L - - -    AST 33. ALT 30. T. Bili 0.9. Alk phos 55 on 03/27/2021 INR 1.0 on 03/22/2021  RUQ sono 03/18/2021: Hepatic steatosis with mildly nodular liver contours, suggesting cirrhosis. No ascites.  Dilated common bile duct measuring 9 mm, previously 10 mm on  prior ultrasound in October 2021. If not already performed, MRI/MRCP is recommended for further evaluation.  EGD 07/28/2020: - Mild, non-specific gastritis, biopsied to check for H. pylori. - The examination was otherwise normal. - No signs of portal hypertension. Surgical [P], gastric antrum and gastric body - REACTIVE GASTROPATHY. [place] IS NEGATIVE FOR HELICOBACTER PYLORI. - NO INTESTINAL METAPLASIA, DYSPLASIA, OR MALIGNANCY.  Colonoscopy October 2019. No polyps.  Repeat screening examination recommended at 10-year interval.  Current Outpatient  Medications on File Prior to Visit  Medication Sig Dispense Refill   folic acid (FOLVITE) 1 MG tablet Take 1 tablet (1 mg total) by mouth daily.     lisinopril (ZESTRIL) 40 MG tablet TAKE 1 TABLET BY MOUTH EVERY DAY 90 tablet 1   magnesium oxide (MAG-OX) 400 (240 Mg) MG tablet Take 1 tablet (400 mg total) by mouth 2 (two) times daily.     melatonin 5 MG TABS Take 1 tablet (5 mg total) by mouth at bedtime as needed.  0   metoprolol succinate (TOPROL-XL) 100 MG 24 hr tablet Take 1 tablet (100 mg total) by mouth daily. Take with or immediately following a meal. 90 tablet 1   Multiple Vitamin (MULTIVITAMIN WITH MINERALS) TABS tablet Take 1 tablet by mouth daily.     urea (URE-NA) 15 g PACK oral packet Take 15 g by mouth daily. TAKE IT UNTIL YOU HAVE CONSISTENT SOLUTE INTAKE then STOP it once eating food well.     No current facility-administered medications on file prior to visit.   Allergies  Allergen Reactions   Codeine Other (See Comments)    REACTION: insomnia and anxious    Current Medications, Allergies, Past Medical History, Past Surgical History, Family History and Social History were reviewed in [employer] record.   Review of Systems:   Constitutional: Negative for fever, sweats, chills or weight loss.  Respiratory: Negative for shortness of breath.   Cardiovascular: Negative for chest pain, palpitations and leg swelling.  Gastrointestinal: See HPI.  Musculoskeletal: Negative for back pain or muscle aches.  Neurological: Negative for dizziness, headaches or paresthesias.    Physical Exam: BP 118/70 (BP Location: Left Arm, Patient Position: Sitting, Cuff Size: Normal)   Pulse 72   Ht 4' 11.25" (1.505 m)   Wt 104 lb (47.2 kg)   BMI 20.83 kg/m   Wt Readings from Last 3 Encounters:  05/20/21 104 lb (47.2 kg)  05/10/21 104 lb 3.2 oz (47.3 kg)  03/28/21 110 lb 3.7 oz (50 kg)    General: Thin 65 year old female with an unstable gait, assisted to exam  table without distress in NAD. Head: Normocephalic and atraumatic. Eyes: No scleral icterus. Conjunctiva pink . Ears: Normal auditory acuity. Mouth: Dentition intact. No ulcers or lesions.  Lungs: Clear throughout to auscultation, decreased breath sounds in the  bases.  Heart: Regular rate and rhythm, no murmur. Abdomen: Soft, nontender and nondistended. No ascites. No masses or hepatomegaly. Normal bowel sounds x 4 quadrants.  Rectal: Deferred.  Musculoskeletal: Symmetrical with no gross deformities. Extremities: No edema. Neurological: Alert oriented x 4. Generalized weakness. No asterixis.  Psychological: Alert and cooperative. 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 MELD 6. Normal LFTs, INR and PLT count. No overt hepatic encephalopathy. RUQ abd sono 03/18/2021 showed a dilated CBD. -No alcohol ever dicussed with the patient  -Recommended alcohol rehab, patient intends to attend virtual AA meetings  -AFP -  Abdominal MRI/MRCPC with/without contrast  -Boost or Ensure 1 bottle daily  -Protein 1.2 - 1.5 g/kg/day -Continue MVI, Thiamine and folate po QD -Further recommendations to be determined after abdominal MRI/MRCP results reviewed  -Follow up in office in 6 months   2) Recent hospital admission due to ataxia, multiple falls, generalized weakness and hyponatremia -Continue follow up and labs with Dr. Carollee Herter (BMP and magnesium level ordered for next week)

## 2021-05-23 ENCOUNTER — Telehealth: Payer: Self-pay | Admitting: Family Medicine

## 2021-05-23 ENCOUNTER — Telehealth: Payer: Self-pay

## 2021-05-23 LAB — AFP TUMOR MARKER: AFP-Tumor Marker: 3.4 ng/mL

## 2021-05-23 NOTE — Progress Notes (Signed)
Beth, pls contact the patient and let her know she should still be taking Thiamine. Pls send in Rx for Thiamine 100mg  one po once daily # 90, 1 additional refill. Thx

## 2021-05-23 NOTE — Telephone Encounter (Addendum)
-  Noralyn Pick, NP at 05/20/2021 11:00 AM  Status: Signed  Beth, pls contact the patient and let her know she should still be taking Thiamine. Pls send in Rx for Thiamine 100mg  one po once daily # 90, 1 additional refill. Thx     Called the patient. No answer. Left her a message to call back to discuss. Recall for office visit entered.

## 2021-05-23 NOTE — Progress Notes (Signed)
I agree with the above note, plan.  ROV with me in 6 months.

## 2021-05-23 NOTE — Telephone Encounter (Signed)
Patient's friend called to ask if we still had the [employer] form for transportation that the patient had dropped off on her last visit on 11/01. She stated that this paper was to get the patient help for transportation and it was left with [place] so we could fax it back to them. [employer] contacted her and told her only part A was filled and part B needed to be filled out by this office. She would like to know if we can fax part B to them. Please advise.

## 2021-05-23 NOTE — Progress Notes (Signed)
Beth, pls place office recall with Dr. Ardis Hughs in 6 months thx

## 2021-05-26 ENCOUNTER — Telehealth: Payer: Self-pay | Admitting: *Deleted

## 2021-05-26 DIAGNOSIS — R260 Ataxic gait: Secondary | ICD-10-CM | POA: Diagnosis not present

## 2021-05-26 DIAGNOSIS — G8929 Other chronic pain: Secondary | ICD-10-CM | POA: Diagnosis not present

## 2021-05-26 DIAGNOSIS — M6281 Muscle weakness (generalized): Secondary | ICD-10-CM | POA: Diagnosis not present

## 2021-05-26 DIAGNOSIS — F1020 Alcohol dependence, uncomplicated: Secondary | ICD-10-CM | POA: Diagnosis not present

## 2021-05-26 DIAGNOSIS — Z9181 History of falling: Secondary | ICD-10-CM | POA: Diagnosis not present

## 2021-05-26 DIAGNOSIS — I10 Essential (primary) hypertension: Secondary | ICD-10-CM | POA: Diagnosis not present

## 2021-05-26 DIAGNOSIS — F32A Depression, unspecified: Secondary | ICD-10-CM | POA: Diagnosis not present

## 2021-05-26 DIAGNOSIS — R339 Retention of urine, unspecified: Secondary | ICD-10-CM | POA: Diagnosis not present

## 2021-05-26 DIAGNOSIS — F419 Anxiety disorder, unspecified: Secondary | ICD-10-CM | POA: Diagnosis not present

## 2021-05-26 DIAGNOSIS — M25511 Pain in right shoulder: Secondary | ICD-10-CM | POA: Diagnosis not present

## 2021-05-26 DIAGNOSIS — D539 Nutritional anemia, unspecified: Secondary | ICD-10-CM | POA: Diagnosis not present

## 2021-05-26 DIAGNOSIS — K7030 Alcoholic cirrhosis of liver without ascites: Secondary | ICD-10-CM | POA: Diagnosis not present

## 2021-05-26 DIAGNOSIS — E538 Deficiency of other specified B group vitamins: Secondary | ICD-10-CM | POA: Diagnosis not present

## 2021-05-26 NOTE — Telephone Encounter (Signed)
Pt has lab appointment on 06/06/21.  She has orders for bmet and magnesium dated 05/13/21. She also has orders for: Cmet, CBCw/Diff, TSH and Lipids dated 12/27/20.  Do you want the labs from 12/27/20 drawn at upcoming appointment as well or should those be cancelled at this time?

## 2021-05-26 NOTE — Telephone Encounter (Signed)
Orders updated

## 2021-05-27 NOTE — Telephone Encounter (Signed)
Spoke with [name]. Advised that the forms have been sent to scan and have not been scanned into the patient's chart yet. I advised having Part B sent to [place] again so we could fill it out again instead of waiting for the forms. [name] states she will see what she can do.

## 2021-05-28 ENCOUNTER — Other Ambulatory Visit (HOSPITAL_COMMUNITY): Payer: Self-pay | Admitting: Nurse Practitioner

## 2021-05-28 ENCOUNTER — Ambulatory Visit (HOSPITAL_COMMUNITY)
Admission: RE | Admit: 2021-05-28 | Discharge: 2021-05-28 | Disposition: A | Discharge status: Home / Self Care | Payer: BC Managed Care – PPO | Source: Ambulatory Visit | Attending: Nurse Practitioner | Admitting: Nurse Practitioner

## 2021-05-28 DIAGNOSIS — K838 Other specified diseases of biliary tract: Secondary | ICD-10-CM | POA: Diagnosis not present

## 2021-05-28 DIAGNOSIS — K7030 Alcoholic cirrhosis of liver without ascites: Secondary | ICD-10-CM | POA: Diagnosis not present

## 2021-05-28 DIAGNOSIS — N281 Cyst of kidney, acquired: Secondary | ICD-10-CM | POA: Diagnosis not present

## 2021-05-28 DIAGNOSIS — K828 Other specified diseases of gallbladder: Secondary | ICD-10-CM | POA: Diagnosis not present

## 2021-05-28 MED ORDER — GADOBUTROL 1 MMOL/ML IV SOLN
5.0000 mL | Freq: Once | INTRAVENOUS | Status: AC | PRN
  Administered 2021-05-28: 5 mL via INTRAVENOUS

## 2021-05-30 ENCOUNTER — Other Ambulatory Visit: Payer: BC Managed Care – PPO

## 2021-05-30 ENCOUNTER — Ambulatory Visit: Payer: BC Managed Care – PPO | Admitting: *Deleted

## 2021-05-30 DIAGNOSIS — D539 Nutritional anemia, unspecified: Secondary | ICD-10-CM | POA: Diagnosis not present

## 2021-05-30 DIAGNOSIS — R339 Retention of urine, unspecified: Secondary | ICD-10-CM | POA: Diagnosis not present

## 2021-05-30 DIAGNOSIS — F32A Depression, unspecified: Secondary | ICD-10-CM

## 2021-05-30 DIAGNOSIS — F1010 Alcohol abuse, uncomplicated: Secondary | ICD-10-CM

## 2021-05-30 DIAGNOSIS — K7030 Alcoholic cirrhosis of liver without ascites: Secondary | ICD-10-CM | POA: Diagnosis not present

## 2021-05-30 DIAGNOSIS — F418 Other specified anxiety disorders: Secondary | ICD-10-CM

## 2021-05-30 DIAGNOSIS — I10 Essential (primary) hypertension: Secondary | ICD-10-CM

## 2021-05-30 DIAGNOSIS — Z9181 History of falling: Secondary | ICD-10-CM | POA: Diagnosis not present

## 2021-05-30 DIAGNOSIS — F419 Anxiety disorder, unspecified: Secondary | ICD-10-CM | POA: Diagnosis not present

## 2021-05-30 DIAGNOSIS — I639 Cerebral infarction, unspecified: Secondary | ICD-10-CM

## 2021-05-30 DIAGNOSIS — F10930 Alcohol use, unspecified with withdrawal, uncomplicated: Secondary | ICD-10-CM

## 2021-05-30 DIAGNOSIS — M25511 Pain in right shoulder: Secondary | ICD-10-CM | POA: Diagnosis not present

## 2021-05-30 DIAGNOSIS — E538 Deficiency of other specified B group vitamins: Secondary | ICD-10-CM | POA: Diagnosis not present

## 2021-05-30 DIAGNOSIS — R260 Ataxic gait: Secondary | ICD-10-CM | POA: Diagnosis not present

## 2021-05-30 DIAGNOSIS — G459 Transient cerebral ischemic attack, unspecified: Secondary | ICD-10-CM

## 2021-05-30 DIAGNOSIS — R531 Weakness: Secondary | ICD-10-CM

## 2021-05-30 DIAGNOSIS — G8929 Other chronic pain: Secondary | ICD-10-CM | POA: Diagnosis not present

## 2021-05-30 DIAGNOSIS — F322 Major depressive disorder, single episode, severe without psychotic features: Secondary | ICD-10-CM

## 2021-05-30 DIAGNOSIS — M6281 Muscle weakness (generalized): Secondary | ICD-10-CM | POA: Diagnosis not present

## 2021-05-30 DIAGNOSIS — F1011 Alcohol abuse, in remission: Secondary | ICD-10-CM

## 2021-05-30 DIAGNOSIS — F1020 Alcohol dependence, uncomplicated: Secondary | ICD-10-CM | POA: Diagnosis not present

## 2021-05-31 ENCOUNTER — Telehealth: Payer: Self-pay | Admitting: Family Medicine

## 2021-05-31 DIAGNOSIS — I10 Essential (primary) hypertension: Secondary | ICD-10-CM | POA: Diagnosis not present

## 2021-05-31 DIAGNOSIS — D539 Nutritional anemia, unspecified: Secondary | ICD-10-CM | POA: Diagnosis not present

## 2021-05-31 DIAGNOSIS — M6281 Muscle weakness (generalized): Secondary | ICD-10-CM | POA: Diagnosis not present

## 2021-05-31 DIAGNOSIS — F32A Depression, unspecified: Secondary | ICD-10-CM | POA: Diagnosis not present

## 2021-05-31 DIAGNOSIS — F1020 Alcohol dependence, uncomplicated: Secondary | ICD-10-CM | POA: Diagnosis not present

## 2021-05-31 DIAGNOSIS — E538 Deficiency of other specified B group vitamins: Secondary | ICD-10-CM | POA: Diagnosis not present

## 2021-05-31 DIAGNOSIS — F419 Anxiety disorder, unspecified: Secondary | ICD-10-CM | POA: Diagnosis not present

## 2021-05-31 DIAGNOSIS — K7030 Alcoholic cirrhosis of liver without ascites: Secondary | ICD-10-CM | POA: Diagnosis not present

## 2021-05-31 DIAGNOSIS — M25511 Pain in right shoulder: Secondary | ICD-10-CM | POA: Diagnosis not present

## 2021-05-31 DIAGNOSIS — G8929 Other chronic pain: Secondary | ICD-10-CM | POA: Diagnosis not present

## 2021-05-31 DIAGNOSIS — R260 Ataxic gait: Secondary | ICD-10-CM | POA: Diagnosis not present

## 2021-05-31 DIAGNOSIS — R339 Retention of urine, unspecified: Secondary | ICD-10-CM | POA: Diagnosis not present

## 2021-05-31 DIAGNOSIS — Z9181 History of falling: Secondary | ICD-10-CM | POA: Diagnosis not present

## 2021-05-31 NOTE — Telephone Encounter (Signed)
Beth from [facility] [address] is calling to see if Dr. Etter Sjogren would be able to order a liver function panel for the patient. She states that the patient will be here next week to get labs and getting a ride to place is hard, so they are trying to see if she would be able to get the Hepatic panel done here as well. Please advice. [phone] is her direct number.

## 2021-05-31 NOTE — Telephone Encounter (Signed)
POA think the patient is still taking the Thiamine. She will check. Advised of the need for follow up lab.

## 2021-05-31 NOTE — Chronic Care Management (AMB) (Signed)
Care Management Clinical Social Work Note  05/31/2021 Name: [name] MRN: [id number] DOB: 1955/07/13  [name] is a 65 y.o. year old female who is a primary care patient of Ann Held, DO.  The Care Management team was consulted for assistance with chronic disease management and coordination needs.  Engaged with patient by telephone for follow up visit in response to provider referral for social work chronic care management and care coordination services  Consent to Services:  Ms. Sossamon was given information about Care Management services today including:  Care Management services includes personalized support from designated clinical staff supervised by her physician, including individualized plan of care and coordination with other care providers 24/7 contact phone numbers for assistance for urgent and routine care needs. The patient may stop case management services at any time by phone call to the office staff.  Patient agreed to services and consent obtained.   Assessment: Review of patient past medical history, allergies, medications, and health status, including review of relevant consultants reports was performed today as part of a comprehensive evaluation and provision of chronic care management and care coordination services.  SDOH (Social Determinants of Health) assessments and interventions performed:    Advanced Directives Status: Not addressed in this encounter.  Care Plan  Allergies  Allergen Reactions   Codeine Other (See Comments)    REACTION: insomnia and anxious    Outpatient Encounter Medications as of 05/30/2021  Medication Sig   folic acid (FOLVITE) 1 MG tablet Take 1 tablet (1 mg total) by mouth daily.   lisinopril (ZESTRIL) 40 MG tablet TAKE 1 TABLET BY MOUTH EVERY DAY   magnesium oxide (MAG-OX) 400 (240 Mg) MG tablet Take 1 tablet (400 mg total) by mouth 2 (two) times daily.   melatonin 5 MG TABS Take 1 tablet (5 mg total)  by mouth at bedtime as needed.   metoprolol succinate (TOPROL-XL) 100 MG 24 hr tablet Take 1 tablet (100 mg total) by mouth daily. Take with or immediately following a meal.   Multiple Vitamin (MULTIVITAMIN WITH MINERALS) TABS tablet Take 1 tablet by mouth daily.   urea (URE-NA) 15 g PACK oral packet Take 15 g by mouth daily. TAKE IT UNTIL YOU HAVE CONSISTENT SOLUTE INTAKE then STOP it once eating food well.   No facility-administered encounter medications on file as of 05/30/2021.    Patient Active Problem List   Diagnosis Date Noted   Weakness 05/10/2021   Gait disturbance 03/22/2021   Falls frequently 03/22/2021   Alcohol abuse 89/21/1941   Alcoholic cirrhosis ([place]) 74/02/1447   Ataxia 03/22/2020   Alcohol withdrawal ([place]) 10/09/2015   Nausea, vomiting and diarrhea 10/07/2015   Diarrhea 10/07/2015   Anxiety and depression    Acute ischemic stroke [facility])    Left leg weakness 06/13/2015   TIA (transient ischemic attack) 06/13/2015   Hyponatremia 06/13/2015   Cornea scar 09/24/2012   H/O corneal ulcer 09/24/2012   Bronchitis 06/14/2012   Error, refractive, myopia 02/29/2012   Presbyopia 01/25/2012   H/O ETOH abuse 09/13/2011   DYSURIA 10/22/2009   PROTEINURIA, MILD 04/21/2009   ABNORMAL FINDINGS GI TRACT 12/03/2007   Essential hypertension 10/22/2007    Conditions to be addressed/monitored: Anxiety, Depression and Alcohol Use and Abuse Disorder.  Mental Health Concerns, Family and Relationship Dysfunction, Social Isolation, Limited Access to Caregiver, and Lacks Knowledge of [employer].  Care Plan : LCSW Plan of Care  Updates made by Francis Gaines, LCSW since 05/31/2021 12:00  AM     Problem: Refrain from Drinking Alcohol.   Priority: High     Goal: Refrain from Drinking Alcohol.   Start Date: 05/16/2021  Expected End Date: 07/15/2021  This Visit's Progress: On track  Recent Progress: On track  Priority: High  Note:   Current Barriers:  Patient with  Alcoholic Cirrhosis of Liver, Alcohol Use and Abuse Disorder, Alcohol Withdrawal and Dependence, Depression and Anxiety, needs support, education, resources, referrals, advocacy and care coordination, in order to meet unmet mental health and substance abuse needs. Clinical Goal(s):  Patient will reduce self-medicating behavior and increase ability of:  coping skills, healthy habits, and self-management skills. Interventions:  Collaboration with Primary Care Physician, Dr. Roma Schanz regarding development and update of comprehensive plan of care as evidenced by provider attestation and co-signature. Collaboration with Primary Care Physician, Dr. Roma Schanz to request order for home health physical therapy services, per patient's request. Inter-disciplinary care team collaboration (see longitudinal plan of care). Clinical Interventions: Provided mental health counseling with regards to Alcohol Use and Abuse, as well as Depression and Anxiety.   Solution-Focused Strategies employed, Active Listening utilized, Engineer, petroleum provided, Verbalization of Feelings encouraged, Participation in Counseling         and Support Group Involvement encouraged.  Patient Goals/Self-Care Activities:  Continue to receive personal counseling with LCSW, on a bi-weekly basis, to obtain resources and services for alcohol use and abuse, and reduce symptoms of Depression and Anxiety. Continue to review and implement interventions provided and discussed: Avoid people and places where alcohol is used. Develop a plan to avoid relapse. Join Alcoholics Anonymous and establish services with a sponsor.   Identify triggers for relapse and make a plan to deal with triggers, such as holidays, anniversaries, etc. Learn how to handle negative feelings without alcohol use. Make a list of pleasurable things to do without using alcohol. Continue to consider attending in-person, as well as virtual, Alcoholics Anonymous  Meetings, from the schedules provided. [facility] [phone]), for all transportation needs, to and from a [facility] affiliated provider and/or practice.   Contact LCSW directly (# [id number]) if you have questions, need assistance, or if additional social work needs are identified between now and our next scheduled telephone outreach call. Follow-Up Plan:  06/13/2021 at 10:30 am                 [place] Clinical Social Worker [place] [phone]

## 2021-05-31 NOTE — Patient Instructions (Signed)
Visit Information  Thank you for taking time to visit with me today. Please don't hesitate to contact me if I can be of assistance to you before our next scheduled telephone appointment.  Following are the goals we discussed today:   Patient Goals: Continue to receive personal counseling with LCSW, on a bi-weekly basis, to obtain resources and services for alcohol use and abuse, and reduce symptoms of Depression and Anxiety. Continue to review and implement interventions provided and discussed: Avoid people and places where alcohol is used. Develop a plan to avoid relapse. Join Alcoholics Anonymous and establish services with a sponsor.   Identify triggers for relapse and make a plan to deal with triggers, such as holidays, anniversaries, etc. Learn how to handle negative feelings without alcohol use. Make a list of pleasurable things to do without using alcohol. Continue to consider attending in-person, as well as virtual, Alcoholics Anonymous Meetings, from the schedules provided. [facility] [phone]), for all transportation needs, to and from a [facility] affiliated provider and/or practice.   Contact LCSW directly (# [id number]) if you have questions, need assistance, or if additional social work needs are identified between now and our next scheduled telephone outreach call.  Our next appointment is by telephone on 06/13/2021 at 10:30 am.  Please call the care guide team at [phone] if you need to cancel or reschedule your appointment.   Please call the Suicide and Crisis Lifeline: 988 call the [place] National Suicide Prevention Lifeline: 586-348-5893 or TTY: 640-623-6973 TTY (440)168-7484) to talk to a trained counselor call 1-800-273-TALK (toll free, 24 hour hotline) go to [facility] [address] [place] [phone]) call 911 if you are experiencing a Mental Health or [place]  or need someone to talk to.  Patient verbalizes understanding of instructions provided today and agrees to view in [place].   [place] Licensed Clinical Social Worker [place] [phone]

## 2021-06-01 NOTE — Telephone Encounter (Signed)
Order placed

## 2021-06-06 ENCOUNTER — Other Ambulatory Visit (INDEPENDENT_AMBULATORY_CARE_PROVIDER_SITE_OTHER): Payer: BC Managed Care – PPO

## 2021-06-06 ENCOUNTER — Other Ambulatory Visit: Payer: Self-pay

## 2021-06-06 DIAGNOSIS — K7030 Alcoholic cirrhosis of liver without ascites: Secondary | ICD-10-CM | POA: Diagnosis not present

## 2021-06-06 DIAGNOSIS — E871 Hypo-osmolality and hyponatremia: Secondary | ICD-10-CM | POA: Diagnosis not present

## 2021-06-06 DIAGNOSIS — M810 Age-related osteoporosis without current pathological fracture: Secondary | ICD-10-CM | POA: Diagnosis not present

## 2021-06-06 DIAGNOSIS — I10 Essential (primary) hypertension: Secondary | ICD-10-CM | POA: Diagnosis not present

## 2021-06-06 DIAGNOSIS — E785 Hyperlipidemia, unspecified: Secondary | ICD-10-CM

## 2021-06-07 DIAGNOSIS — Z9181 History of falling: Secondary | ICD-10-CM | POA: Diagnosis not present

## 2021-06-07 DIAGNOSIS — M6281 Muscle weakness (generalized): Secondary | ICD-10-CM | POA: Diagnosis not present

## 2021-06-07 DIAGNOSIS — R260 Ataxic gait: Secondary | ICD-10-CM | POA: Diagnosis not present

## 2021-06-07 DIAGNOSIS — K7030 Alcoholic cirrhosis of liver without ascites: Secondary | ICD-10-CM | POA: Diagnosis not present

## 2021-06-07 DIAGNOSIS — F419 Anxiety disorder, unspecified: Secondary | ICD-10-CM | POA: Diagnosis not present

## 2021-06-07 DIAGNOSIS — G8929 Other chronic pain: Secondary | ICD-10-CM | POA: Diagnosis not present

## 2021-06-07 DIAGNOSIS — M25511 Pain in right shoulder: Secondary | ICD-10-CM | POA: Diagnosis not present

## 2021-06-07 DIAGNOSIS — R339 Retention of urine, unspecified: Secondary | ICD-10-CM | POA: Diagnosis not present

## 2021-06-07 DIAGNOSIS — D539 Nutritional anemia, unspecified: Secondary | ICD-10-CM | POA: Diagnosis not present

## 2021-06-07 DIAGNOSIS — F1020 Alcohol dependence, uncomplicated: Secondary | ICD-10-CM | POA: Diagnosis not present

## 2021-06-07 DIAGNOSIS — I10 Essential (primary) hypertension: Secondary | ICD-10-CM | POA: Diagnosis not present

## 2021-06-07 DIAGNOSIS — E538 Deficiency of other specified B group vitamins: Secondary | ICD-10-CM | POA: Diagnosis not present

## 2021-06-07 DIAGNOSIS — F32A Depression, unspecified: Secondary | ICD-10-CM | POA: Diagnosis not present

## 2021-06-07 LAB — COMPREHENSIVE METABOLIC PANEL
ALT: 12 U/L (ref 0–35)
AST: 20 U/L (ref 0–37)
Albumin: 4.9 g/dL (ref 3.5–5.2)
Alkaline Phosphatase: 63 U/L (ref 39–117)
BUN: 17 mg/dL (ref 6–23)
CO2: 26 mEq/L (ref 19–32)
Calcium: 10.4 mg/dL (ref 8.4–10.5)
Chloride: 91 mEq/L — ABNORMAL LOW (ref 96–112)
Creatinine, Ser: 0.79 mg/dL (ref 0.40–1.20)
GFR: 79.05 mL/min (ref >60.00)
Glucose, Bld: 97 mg/dL (ref 70–99)
Potassium: 3.9 mEq/L (ref 3.5–5.1)
Sodium: 128 mEq/L — ABNORMAL LOW (ref 135–145)
Total Bilirubin: 0.6 mg/dL (ref 0.2–1.2)
Total Protein: 7.7 g/dL (ref 6.0–8.3)

## 2021-06-07 LAB — CBC WITH DIFFERENTIAL/PLATELET
Basophils Absolute: 0.1 10*3/uL (ref 0.0–0.1)
Basophils Relative: 1.4 % (ref 0.0–3.0)
Eosinophils Absolute: 0.1 10*3/uL (ref 0.0–0.7)
Eosinophils Relative: 1.2 % (ref 0.0–5.0)
HCT: 39.7 % (ref 36.0–46.0)
Hemoglobin: 13.2 g/dL (ref 12.0–15.0)
Lymphocytes Relative: 27.5 % (ref 12.0–46.0)
Lymphs Abs: 1.8 10*3/uL (ref 0.7–4.0)
MCHC: 33.2 g/dL (ref 30.0–36.0)
MCV: 105.1 fl — ABNORMAL HIGH (ref 78.0–100.0)
Monocytes Absolute: 0.5 10*3/uL (ref 0.1–1.0)
Monocytes Relative: 7.4 % (ref 3.0–12.0)
Neutro Abs: 4.1 10*3/uL (ref 1.4–7.7)
Neutrophils Relative %: 62.5 % (ref 43.0–77.0)
Platelets: 317 10*3/uL (ref 150.0–400.0)
RBC: 3.78 Mil/uL — ABNORMAL LOW (ref 3.87–5.11)
RDW: 15.3 % (ref 11.5–15.5)
WBC: 6.6 10*3/uL (ref 4.0–10.5)

## 2021-06-07 LAB — LIPID PANEL
Cholesterol: 252 mg/dL — ABNORMAL HIGH (ref 0–200)
HDL: 85.9 mg/dL (ref >39.00)
NonHDL: 165.61
Total CHOL/HDL Ratio: 3
Triglycerides: 327 mg/dL — ABNORMAL HIGH (ref 0.0–149.0)
VLDL: 65.4 mg/dL — ABNORMAL HIGH (ref 0.0–40.0)

## 2021-06-07 LAB — HEPATIC FUNCTION PANEL
ALT: 12 U/L (ref 0–35)
AST: 20 U/L (ref 0–37)
Albumin: 4.9 g/dL (ref 3.5–5.2)
Alkaline Phosphatase: 63 U/L (ref 39–117)
Bilirubin, Direct: 0.1 mg/dL (ref 0.0–0.3)
Total Bilirubin: 0.6 mg/dL (ref 0.2–1.2)
Total Protein: 7.7 g/dL (ref 6.0–8.3)

## 2021-06-07 LAB — LDL CHOLESTEROL, DIRECT: Direct LDL: 132 mg/dL

## 2021-06-07 LAB — MAGNESIUM: Magnesium: 2 mg/dL (ref 1.5–2.5)

## 2021-06-07 LAB — TSH: TSH: 1.1 u[IU]/mL (ref 0.35–5.50)

## 2021-06-08 ENCOUNTER — Telehealth: Payer: Self-pay | Admitting: Nurse Practitioner

## 2021-06-08 DIAGNOSIS — K838 Other specified diseases of biliary tract: Secondary | ICD-10-CM

## 2021-06-08 DIAGNOSIS — K7030 Alcoholic cirrhosis of liver without ascites: Secondary | ICD-10-CM

## 2021-06-08 NOTE — Telephone Encounter (Signed)
Inbound call from patient POA [name]. States she had liver function test 11/28 by her PCP Dr. Etter Sjogren. Is requesting is we could call her after reviewing.   And is also having questions about possible clog bile duct and gallbladder from MRI. Best contact number [phone]

## 2021-06-09 NOTE — Telephone Encounter (Signed)
Beth, pls contact the patient's POA. Her LFTs 06/06/2021 done at her PCP's office (but requested by Dr. Ardis Hughs and I) were normal.  Dr. Ardis Hughs, refer to last office visit and abdominal MRI/MRCP report notes, you requested a repeat hepatic panel with thoughts she will likely need an ERCP, timing to be determined after results received. See 06/06/2021 hepatic panel, results were normal.   Please let Beth or Patty  know timing of ERCP so this procedure can be scheduled. THX

## 2021-06-09 NOTE — Telephone Encounter (Signed)
  The patient has had labs done at her PCP as she had said she would. Her POA is asking for your recommendations. The labs were done after her MRI showed areas of concern.

## 2021-06-10 NOTE — Telephone Encounter (Signed)
Left message on machine to call back  

## 2021-06-13 ENCOUNTER — Ambulatory Visit: Payer: BC Managed Care – PPO | Admitting: *Deleted

## 2021-06-13 DIAGNOSIS — F418 Other specified anxiety disorders: Secondary | ICD-10-CM

## 2021-06-13 DIAGNOSIS — I639 Cerebral infarction, unspecified: Secondary | ICD-10-CM

## 2021-06-13 DIAGNOSIS — K7030 Alcoholic cirrhosis of liver without ascites: Secondary | ICD-10-CM

## 2021-06-13 DIAGNOSIS — G459 Transient cerebral ischemic attack, unspecified: Secondary | ICD-10-CM

## 2021-06-13 DIAGNOSIS — F1011 Alcohol abuse, in remission: Secondary | ICD-10-CM

## 2021-06-13 DIAGNOSIS — F1010 Alcohol abuse, uncomplicated: Secondary | ICD-10-CM

## 2021-06-13 DIAGNOSIS — R269 Unspecified abnormalities of gait and mobility: Secondary | ICD-10-CM

## 2021-06-13 DIAGNOSIS — F10930 Alcohol use, unspecified with withdrawal, uncomplicated: Secondary | ICD-10-CM

## 2021-06-13 DIAGNOSIS — F32A Depression, unspecified: Secondary | ICD-10-CM

## 2021-06-13 DIAGNOSIS — F419 Anxiety disorder, unspecified: Secondary | ICD-10-CM

## 2021-06-13 DIAGNOSIS — I10 Essential (primary) hypertension: Secondary | ICD-10-CM

## 2021-06-13 DIAGNOSIS — F322 Major depressive disorder, single episode, severe without psychotic features: Secondary | ICD-10-CM

## 2021-06-13 DIAGNOSIS — R296 Repeated falls: Secondary | ICD-10-CM

## 2021-06-13 NOTE — Chronic Care Management (AMB) (Signed)
Chronic Care Management    Clinical Social Work Note  06/13/2021 Name: [name] MRN: [id number] DOB: 1956-04-02  ALAISHA Skinner is a 65 y.o. year old female who is a primary care patient of Ann Held, DO. The CCM team was consulted to assist the patient with chronic disease management and/or care coordination needs related to: Appointment Scheduling Needs, [employer], and Mental Health Counseling and Resources.   Engaged with patient by telephone for follow up visit in response to provider referral for social work chronic care management and care coordination services.   Consent to Services:  The patient was given information about Chronic Care Management services, agreed to services, and gave verbal consent prior to initiation of services.  Please see initial visit note for detailed documentation.   Patient agreed to services and consent obtained.   Assessment: Review of patient past medical history, allergies, medications, and health status, including review of relevant consultants reports was performed today as part of a comprehensive evaluation and provision of chronic care management and care coordination services.     SDOH (Social Determinants of Health) assessments and interventions performed:    Advanced Directives Status: Not addressed in this encounter.  CCM Care Plan  Allergies  Allergen Reactions   Codeine Other (See Comments)    REACTION: insomnia and anxious    Outpatient Encounter Medications as of 06/13/2021  Medication Sig   folic acid (FOLVITE) 1 MG tablet Take 1 tablet (1 mg total) by mouth daily.   lisinopril (ZESTRIL) 40 MG tablet TAKE 1 TABLET BY MOUTH EVERY DAY   magnesium oxide (MAG-OX) 400 (240 Mg) MG tablet Take 1 tablet (400 mg total) by mouth 2 (two) times daily.   melatonin 5 MG TABS Take 1 tablet (5 mg total) by mouth at bedtime as needed.   metoprolol succinate (TOPROL-XL) 100 MG 24 hr tablet Take 1 tablet (100 mg  total) by mouth daily. Take with or immediately following a meal.   Multiple Vitamin (MULTIVITAMIN WITH MINERALS) TABS tablet Take 1 tablet by mouth daily.   urea (URE-NA) 15 g PACK oral packet Take 15 g by mouth daily. TAKE IT UNTIL YOU HAVE CONSISTENT SOLUTE INTAKE then STOP it once eating food well.   No facility-administered encounter medications on file as of 06/13/2021.    Patient Active Problem List   Diagnosis Date Noted   Weakness 05/10/2021   Gait disturbance 03/22/2021   Falls frequently 03/22/2021   Alcohol abuse 41/74/0814   Alcoholic cirrhosis ([place]) 48/18/5631   Ataxia 03/22/2020   Alcohol withdrawal ([place]) 10/09/2015   Nausea, vomiting and diarrhea 10/07/2015   Diarrhea 10/07/2015   Anxiety and depression    Acute ischemic stroke [facility])    Left leg weakness 06/13/2015   TIA (transient ischemic attack) 06/13/2015   Hyponatremia 06/13/2015   Cornea scar 09/24/2012   H/O corneal ulcer 09/24/2012   Bronchitis 06/14/2012   Error, refractive, myopia 02/29/2012   Presbyopia 01/25/2012   H/O ETOH abuse 09/13/2011   DYSURIA 10/22/2009   PROTEINURIA, MILD 04/21/2009   ABNORMAL FINDINGS GI TRACT 12/03/2007   Essential hypertension 10/22/2007    Conditions to be addressed/monitored: Anxiety, Depression, and Alcohol Abuse Disorder.  Limited Social Support, Mental Health Concerns, Alcohol Abuse Issues, Social Isolation, Limited Access to Caregiver, and Lacks Knowledge of [employer].  Care Plan : LCSW Plan of Care  Updates made by Francis Gaines, LCSW since 06/13/2021 12:00 AM     Problem: Refrain from Drinking Alcohol.  Priority: High     Goal: Refrain from Drinking Alcohol.   Start Date: 05/16/2021  Expected End Date: 09/13/2021  This Visit's Progress: On track  Recent Progress: On track  Priority: High  Note:   Current Barriers:  Patient with Alcoholic Cirrhosis of Liver, Alcohol Use and Abuse Disorder, Alcohol Withdrawal and Dependence, Depression and  Anxiety, needs support, education, resources, referrals, advocacy and care coordination, in order to meet unmet mental health and substance abuse needs. Clinical Goal(s):  Patient will reduce self-medicating behavior and increase ability of:  coping skills, healthy habits, and self-management skills. Interventions:  Collaboration with Primary Care Physician, Dr. Roma Schanz regarding development and update of comprehensive plan of care as evidenced by provider attestation and co-signature. Inter-disciplinary care team collaboration (see longitudinal plan of care). Clinical Interventions: Provided mental health counseling with regards to Alcohol Use and Abuse, as well as Depression and Anxiety.   Solution-Focused Strategies employed, Active Listening utilized, Engineer, petroleum provided, Verbalization of Feelings encouraged, AA Attendance Diplomatic Services operational officer and [employer]) emphasized, and Participation in Counseling and Support Group Involvement encouraged.  Patient Goals/Self-Care Activities:  Continue to receive personal counseling with LCSW, on a bi-weekly basis, to obtain resources and services for alcohol use and abuse, and reduce symptoms of Depression and Anxiety. Review AA Animator, Meeting Accessibility, Meetings & Events, and About AA, Information and Instructions, e-mailed to you ([email or web address]), per your request, on 06/13/2021.  ~ E-mail confirmation received. ~ Continue attending in-person, as well as virtual, Alcoholics Anonymous Meetings, from the schedules provided. Contact LCSW directly (# [id number]) if you have questions, need assistance, or if additional social work needs are identified in the near future.   Follow-Up Plan:   No follow-up required at this time, per patient request.  Patient reported that she would like to "get through the holiday's", agreeing to contact LCSW after the first of the year to schedule follow-up appointment.  [place] Licensed Clinical Social Worker [place] [phone]

## 2021-06-13 NOTE — Patient Instructions (Addendum)
Visit Information  Thank you for taking time to visit with me today. Please don't hesitate to contact me if I can be of assistance to you before our next scheduled telephone appointment.  Following are the goals we discussed today:   Patient Goals/Self-Care Activities:  Continue to receive personal counseling with LCSW, on a bi-weekly basis, to obtain resources and services for alcohol use and abuse, and reduce symptoms of Depression and Anxiety. Review AA Animator, Meeting Accessibility, Meetings & Events, and About AA, Information and Instructions, e-mailed to you ([email or web address]), per your request, on 06/13/2021.  ~ E-mail confirmation received. ~ Continue attending in-person, as well as virtual, Alcoholics Anonymous Meetings, from the schedules provided. Contact LCSW directly (# [id number]) if you have questions, need assistance, or if additional social work needs are identified in the near future.    Follow-Up Plan:   No follow-up required at this time, per patient request.  Patient reported that she would like to "get through the holiday's", agreeing to contact LCSW after the first of the year to schedule follow-up appointment.  Please call the care guide team at [phone] if you need to cancel or reschedule your appointment.   If you are experiencing a Mental Health or [place] or need someone to talk to, please call the Suicide and Crisis Lifeline: 988 call the [place] National Suicide Prevention Lifeline: 878-848-5878 or TTY: 234-023-0774 TTY (252) 613-5686) to talk to a trained counselor call 1-800-273-TALK (toll free, 24 hour hotline) go to [facility] [address], [place] [phone]) call the [place]: [phone] call 911   Patient verbalizes understanding of instructions provided today and agrees to view in [place].   [place] Licensed Clinical Social Worker [place] [phone]

## 2021-06-14 ENCOUNTER — Other Ambulatory Visit: Payer: Self-pay

## 2021-06-14 DIAGNOSIS — K7030 Alcoholic cirrhosis of liver without ascites: Secondary | ICD-10-CM

## 2021-06-14 DIAGNOSIS — K838 Other specified diseases of biliary tract: Secondary | ICD-10-CM

## 2021-06-14 NOTE — Telephone Encounter (Signed)
I spoke with [name] the [place] at [phone] and discussed the results and need for ERCP and labs.  Lab orders have been entered and ERCP scheduled on 07/28/21 at 730 am at [facility] with DJ. Instructions have been mailed to the pt home. The POA was instructed and all questions answered to the best of my ability

## 2021-06-15 ENCOUNTER — Telehealth: Payer: Self-pay | Admitting: Gastroenterology

## 2021-06-15 NOTE — Telephone Encounter (Signed)
[name] called seeking advise, states that [name] is having dentinal work on 01/17 which is 2 days before ER CP with Ardis Hughs. Wanting to make sure that will be okay, due to having all of her bottom teeth removed and 4 dental Implants put in. Please advise.

## 2021-06-15 NOTE — Telephone Encounter (Signed)
Dr Ardis Hughs this pt is having dental work on 1/17 2 days prior to ERCP where she is having her bottom teeth removed and 4 implants put in.  Can she proceed with ERCP as planned? Should she try to mover her dental appt ?  Please advise

## 2021-06-15 NOTE — Telephone Encounter (Signed)
I spoke with the [facility] and she states she is going to change the date of the dental appt and keep the ERCP as planned.

## 2021-06-17 ENCOUNTER — Ambulatory Visit: Payer: BC Managed Care – PPO | Admitting: Family Medicine

## 2021-06-20 ENCOUNTER — Ambulatory Visit: Payer: BC Managed Care – PPO

## 2021-07-06 ENCOUNTER — Other Ambulatory Visit: Payer: Self-pay | Admitting: Family Medicine

## 2021-07-06 DIAGNOSIS — I10 Essential (primary) hypertension: Secondary | ICD-10-CM

## 2021-07-19 ENCOUNTER — Encounter (HOSPITAL_COMMUNITY): Payer: Self-pay | Admitting: Gastroenterology

## 2021-07-19 NOTE — Progress Notes (Signed)
Attempted to obtain medical history via telephone, unable to reach at this time. I left a voicemail to return pre surgical testing department's phone call.  

## 2021-07-22 ENCOUNTER — Telehealth: Payer: Self-pay | Admitting: Nurse Practitioner

## 2021-07-22 NOTE — Telephone Encounter (Signed)
Inbound call from patient requesting to speak with a nurse in regards to upcoming procedure.  Wants to discuss if she really needs to have it.  Please advise.

## 2021-07-22 NOTE — Telephone Encounter (Signed)
Patient with dilated bile duct and cirrhosis.  She is scheduled for an ERCP next week. The estimate of her part of the bill after insurance was received by her. She reports she is estimated to owe over $9000 after insurance. She is asking if she still needs the ERCP. She does not feel she should do it because of the cost. Asks if there is anything else that can be done to determine if she still needs the procedure.

## 2021-07-22 NOTE — Telephone Encounter (Signed)
Shared this information with the patient. She states she will go forward with the procedure.

## 2021-07-25 ENCOUNTER — Other Ambulatory Visit (INDEPENDENT_AMBULATORY_CARE_PROVIDER_SITE_OTHER): Payer: BC Managed Care – PPO

## 2021-07-25 DIAGNOSIS — K7030 Alcoholic cirrhosis of liver without ascites: Secondary | ICD-10-CM

## 2021-07-25 DIAGNOSIS — K838 Other specified diseases of biliary tract: Secondary | ICD-10-CM

## 2021-07-25 LAB — COMPREHENSIVE METABOLIC PANEL
ALT: 46 U/L — ABNORMAL HIGH (ref 0–35)
AST: 66 U/L — ABNORMAL HIGH (ref 0–37)
Albumin: 5.1 g/dL (ref 3.5–5.2)
Alkaline Phosphatase: 76 U/L (ref 39–117)
BUN: 10 mg/dL (ref 6–23)
CO2: 26 mEq/L (ref 19–32)
Calcium: 10.2 mg/dL (ref 8.4–10.5)
Chloride: 88 mEq/L — ABNORMAL LOW (ref 96–112)
Creatinine, Ser: 0.79 mg/dL (ref 0.40–1.20)
GFR: 78.98 mL/min (ref >60.00)
Glucose, Bld: 86 mg/dL (ref 70–99)
Potassium: 5.6 mEq/L — ABNORMAL HIGH (ref 3.5–5.1)
Sodium: 126 mEq/L — ABNORMAL LOW (ref 135–145)
Total Bilirubin: 0.7 mg/dL (ref 0.2–1.2)
Total Protein: 8.4 g/dL — ABNORMAL HIGH (ref 6.0–8.3)

## 2021-07-25 LAB — CBC WITH DIFFERENTIAL/PLATELET
Basophils Absolute: 0 10*3/uL (ref 0.0–0.1)
Basophils Relative: 0.8 % (ref 0.0–3.0)
Eosinophils Absolute: 0.1 10*3/uL (ref 0.0–0.7)
Eosinophils Relative: 1.6 % (ref 0.0–5.0)
HCT: 42.2 % (ref 36.0–46.0)
Hemoglobin: 14.4 g/dL (ref 12.0–15.0)
Lymphocytes Relative: 29.8 % (ref 12.0–46.0)
Lymphs Abs: 1.5 10*3/uL (ref 0.7–4.0)
MCHC: 34.1 g/dL (ref 30.0–36.0)
MCV: 102.3 fl — ABNORMAL HIGH (ref 78.0–100.0)
Monocytes Absolute: 0.7 10*3/uL (ref 0.1–1.0)
Monocytes Relative: 13.1 % — ABNORMAL HIGH (ref 3.0–12.0)
Neutro Abs: 2.8 10*3/uL (ref 1.4–7.7)
Neutrophils Relative %: 54.7 % (ref 43.0–77.0)
Platelets: 237 10*3/uL (ref 150.0–400.0)
RBC: 4.12 Mil/uL (ref 3.87–5.11)
RDW: 14.3 % (ref 11.5–15.5)
WBC: 5 10*3/uL (ref 4.0–10.5)

## 2021-07-25 LAB — PROTIME-INR
INR: 0.9 ratio (ref 0.8–1.0)
Prothrombin Time: 9.5 s — ABNORMAL LOW (ref 9.6–13.1)

## 2021-07-26 ENCOUNTER — Other Ambulatory Visit (INDEPENDENT_AMBULATORY_CARE_PROVIDER_SITE_OTHER): Payer: BC Managed Care – PPO

## 2021-07-26 ENCOUNTER — Other Ambulatory Visit: Payer: Self-pay

## 2021-07-26 DIAGNOSIS — K7030 Alcoholic cirrhosis of liver without ascites: Secondary | ICD-10-CM

## 2021-07-26 DIAGNOSIS — E875 Hyperkalemia: Secondary | ICD-10-CM

## 2021-07-26 DIAGNOSIS — E871 Hypo-osmolality and hyponatremia: Secondary | ICD-10-CM

## 2021-07-26 LAB — BASIC METABOLIC PANEL
BUN: 42 mg/dL — ABNORMAL HIGH (ref 6–23)
CO2: 29 mEq/L (ref 19–32)
Calcium: 9.9 mg/dL (ref 8.4–10.5)
Chloride: 87 mEq/L — ABNORMAL LOW (ref 96–112)
Creatinine, Ser: 0.73 mg/dL (ref 0.40–1.20)
GFR: 86.83 mL/min (ref >60.00)
Glucose, Bld: 93 mg/dL (ref 70–99)
Potassium: 4.4 mEq/L (ref 3.5–5.1)
Sodium: 125 mEq/L — ABNORMAL LOW (ref 135–145)

## 2021-07-26 NOTE — Telephone Encounter (Signed)
Patients friend Mildred called stating the patient recently had labs drawn and they were abnormal therefore her ERCP was canceled also would like to know who would be redoing the labs in order to proceed with the planned ERCP procedure.

## 2021-07-26 NOTE — Telephone Encounter (Signed)
See the alternate results note dated 1/17.

## 2021-07-27 ENCOUNTER — Other Ambulatory Visit: Payer: Self-pay

## 2021-07-27 DIAGNOSIS — E871 Hypo-osmolality and hyponatremia: Secondary | ICD-10-CM

## 2021-07-27 DIAGNOSIS — K7030 Alcoholic cirrhosis of liver without ascites: Secondary | ICD-10-CM

## 2021-07-27 DIAGNOSIS — E875 Hyperkalemia: Secondary | ICD-10-CM

## 2021-07-27 NOTE — Anesthesia Preprocedure Evaluation (Addendum)
Anesthesia Evaluation  Patient identified by MRN, date of birth, ID band Patient awake    Reviewed: Allergy & Precautions, NPO status , Patient's Chart, lab work & pertinent test results  History of Anesthesia Complications Negative for: history of anesthetic complications  Airway Mallampati: II  TM Distance: >3 FB Neck ROM: Full    Dental no notable dental hx. (+) Dental Advisory Given   Pulmonary neg pulmonary ROS,    Pulmonary exam normal        Cardiovascular hypertension, Pt. on medications and Pt. on home beta blockers Normal cardiovascular exam  Study Conclusions   - Left ventricle: The cavity size was normal. Wall thickness was  increased in a pattern of mild LVH. Systolic function was normal.  The estimated ejection fraction was in the range of 55% to 60%.  Wall motion was normal; there were no regional wall motion  abnormalities. Features are consistent with a pseudonormal left  ventricular filling pattern, with concomitant abnormal relaxation  and increased filling pressure (grade 2 diastolic dysfunction).  - Aortic valve: There was no stenosis.  - Mitral valve: There was no significant regurgitation.  - Right ventricle: The cavity size was normal. Systolic function  was normal.  - Pulmonary arteries: No complete TR doppler jet so unable to  estimate PA systolic pressure.  - Inferior vena cava: The vessel was normal in size. The  respirophasic diameter changes were in the normal range (= 50%),  consistent with normal central venous pressure.    Neuro/Psych PSYCHIATRIC DISORDERS Anxiety Depression TIA   GI/Hepatic Neg liver ROS,   Endo/Other  negative endocrine ROS  Renal/GU negative Renal ROS     Musculoskeletal negative musculoskeletal ROS (+)   Abdominal   Peds  Hematology negative hematology ROS (+)   Anesthesia Other Findings   Reproductive/Obstetrics                             Anesthesia Physical Anesthesia Plan  ASA: 2  Anesthesia Plan: General   Post-op Pain Management: Minimal or no pain anticipated   Induction:   PONV Risk Score and Plan: 3 and Ondansetron, Dexamethasone and Midazolam  Airway Management Planned: Oral ETT  Additional Equipment:   Intra-op Plan:   Post-operative Plan: Extubation in OR  Informed Consent: I have reviewed the patients History and Physical, chart, labs and discussed the procedure including the risks, benefits and alternatives for the proposed anesthesia with the patient or authorized representative who has indicated his/her understanding and acceptance.     Dental advisory given  Plan Discussed with: Anesthesiologist and CRNA  Anesthesia Plan Comments:        Anesthesia Quick Evaluation

## 2021-07-28 ENCOUNTER — Ambulatory Visit (HOSPITAL_COMMUNITY)
Admission: RE | Admit: 2021-07-28 | Discharge: 2021-07-28 | Disposition: A | Discharge status: Home / Self Care | Payer: BC Managed Care – PPO | Attending: Gastroenterology | Admitting: Gastroenterology

## 2021-07-28 ENCOUNTER — Ambulatory Visit (HOSPITAL_COMMUNITY): Payer: BC Managed Care – PPO

## 2021-07-28 ENCOUNTER — Encounter (HOSPITAL_COMMUNITY): Payer: Self-pay | Admitting: Gastroenterology

## 2021-07-28 ENCOUNTER — Other Ambulatory Visit: Payer: Self-pay

## 2021-07-28 ENCOUNTER — Ambulatory Visit (HOSPITAL_COMMUNITY): Payer: BC Managed Care – PPO | Admitting: Anesthesiology

## 2021-07-28 ENCOUNTER — Encounter (HOSPITAL_COMMUNITY)
Admission: RE | Disposition: A | Discharge status: Home / Self Care | Payer: Self-pay | Source: Home / Self Care | Attending: Gastroenterology

## 2021-07-28 DIAGNOSIS — K8020 Calculus of gallbladder without cholecystitis without obstruction: Secondary | ICD-10-CM

## 2021-07-28 DIAGNOSIS — F419 Anxiety disorder, unspecified: Secondary | ICD-10-CM | POA: Diagnosis not present

## 2021-07-28 DIAGNOSIS — I10 Essential (primary) hypertension: Secondary | ICD-10-CM | POA: Insufficient documentation

## 2021-07-28 DIAGNOSIS — F32A Depression, unspecified: Secondary | ICD-10-CM | POA: Diagnosis not present

## 2021-07-28 DIAGNOSIS — Z9889 Other specified postprocedural states: Secondary | ICD-10-CM | POA: Diagnosis not present

## 2021-07-28 DIAGNOSIS — K8050 Calculus of bile duct without cholangitis or cholecystitis without obstruction: Secondary | ICD-10-CM

## 2021-07-28 DIAGNOSIS — K7030 Alcoholic cirrhosis of liver without ascites: Secondary | ICD-10-CM | POA: Insufficient documentation

## 2021-07-28 DIAGNOSIS — K839 Disease of biliary tract, unspecified: Secondary | ICD-10-CM | POA: Diagnosis not present

## 2021-07-28 DIAGNOSIS — K838 Other specified diseases of biliary tract: Secondary | ICD-10-CM | POA: Diagnosis not present

## 2021-07-28 DIAGNOSIS — Z8673 Personal history of transient ischemic attack (TIA), and cerebral infarction without residual deficits: Secondary | ICD-10-CM | POA: Insufficient documentation

## 2021-07-28 HISTORY — PX: SPHINCTEROTOMY: SHX5544

## 2021-07-28 HISTORY — PX: ENDOSCOPIC RETROGRADE CHOLANGIOPANCREATOGRAPHY (ERCP) WITH PROPOFOL: SHX5810

## 2021-07-28 HISTORY — PX: REMOVAL OF STONES: SHX5545

## 2021-07-28 SURGERY — ENDOSCOPIC RETROGRADE CHOLANGIOPANCREATOGRAPHY (ERCP) WITH PROPOFOL
Anesthesia: General

## 2021-07-28 MED ORDER — ONDANSETRON HCL 4 MG/2ML IJ SOLN
INTRAMUSCULAR | Status: DC | PRN
  Administered 2021-07-28: 4 mg via INTRAVENOUS

## 2021-07-28 MED ORDER — PHENYLEPHRINE HCL (PRESSORS) 10 MG/ML IV SOLN
INTRAVENOUS | Status: AC
  Filled 2021-07-28: qty 1

## 2021-07-28 MED ORDER — SUGAMMADEX SODIUM 200 MG/2ML IV SOLN
INTRAVENOUS | Status: DC | PRN
  Administered 2021-07-28: 400 mg via INTRAVENOUS

## 2021-07-28 MED ORDER — CIPROFLOXACIN IN D5W 400 MG/200ML IV SOLN
INTRAVENOUS | Status: DC | PRN
  Administered 2021-07-28: 400 mg via INTRAVENOUS

## 2021-07-28 MED ORDER — LACTATED RINGERS IV SOLN
INTRAVENOUS | Status: DC
  Administered 2021-07-28: 1000 mL via INTRAVENOUS

## 2021-07-28 MED ORDER — INDOMETHACIN 50 MG RE SUPP
RECTAL | Status: DC | PRN
  Administered 2021-07-28: 100 mg via RECTAL

## 2021-07-28 MED ORDER — PROPOFOL 10 MG/ML IV BOLUS
INTRAVENOUS | Status: AC
  Filled 2021-07-28: qty 20

## 2021-07-28 MED ORDER — GLUCAGON HCL RDNA (DIAGNOSTIC) 1 MG IJ SOLR
INTRAMUSCULAR | Status: AC
  Filled 2021-07-28: qty 1

## 2021-07-28 MED ORDER — INDOMETHACIN 50 MG RE SUPP
RECTAL | Status: AC
  Filled 2021-07-28: qty 2

## 2021-07-28 MED ORDER — DEXAMETHASONE SODIUM PHOSPHATE 10 MG/ML IJ SOLN
INTRAMUSCULAR | Status: DC | PRN
  Administered 2021-07-28: 10 mg via INTRAVENOUS

## 2021-07-28 MED ORDER — PROPOFOL 10 MG/ML IV BOLUS
INTRAVENOUS | Status: DC | PRN
Start: 2021-07-28 — End: 2021-07-28
  Administered 2021-07-28: 150 mg via INTRAVENOUS
  Administered 2021-07-28: 50 mg via INTRAVENOUS

## 2021-07-28 MED ORDER — MIDAZOLAM HCL 2 MG/2ML IJ SOLN
INTRAMUSCULAR | Status: AC
  Filled 2021-07-28: qty 2

## 2021-07-28 MED ORDER — FENTANYL CITRATE (PF) 100 MCG/2ML IJ SOLN
INTRAMUSCULAR | Status: DC | PRN
Start: 2021-07-28 — End: 2021-07-28
  Administered 2021-07-28 (×2): 50 ug via INTRAVENOUS

## 2021-07-28 MED ORDER — LIDOCAINE 2% (20 MG/ML) 5 ML SYRINGE
INTRAMUSCULAR | Status: DC | PRN
  Administered 2021-07-28: 40 mg via INTRAVENOUS

## 2021-07-28 MED ORDER — SODIUM CHLORIDE 0.9 % IV SOLN
INTRAVENOUS | Status: DC | PRN
  Administered 2021-07-28: 30 mL

## 2021-07-28 MED ORDER — MIDAZOLAM HCL 5 MG/5ML IJ SOLN
INTRAMUSCULAR | Status: DC | PRN
  Administered 2021-07-28: 2 mg via INTRAVENOUS

## 2021-07-28 MED ORDER — FENTANYL CITRATE (PF) 100 MCG/2ML IJ SOLN
INTRAMUSCULAR | Status: AC
  Filled 2021-07-28: qty 2

## 2021-07-28 MED ORDER — CIPROFLOXACIN IN D5W 400 MG/200ML IV SOLN
INTRAVENOUS | Status: AC
  Filled 2021-07-28: qty 200

## 2021-07-28 MED ORDER — ROCURONIUM BROMIDE 10 MG/ML (PF) SYRINGE
PREFILLED_SYRINGE | INTRAVENOUS | Status: DC | PRN
  Administered 2021-07-28: 80 mg via INTRAVENOUS

## 2021-07-28 NOTE — Anesthesia Postprocedure Evaluation (Signed)
Anesthesia Post Note  Patient: [name]  Procedure(s) Performed: ENDOSCOPIC RETROGRADE CHOLANGIOPANCREATOGRAPHY (ERCP) WITH PROPOFOL SPHINCTEROTOMY REMOVAL OF STONES     Patient location during evaluation: PACU Anesthesia Type: General Level of consciousness: sedated Pain management: pain level controlled Vital Signs Assessment: post-procedure vital signs reviewed and stable Respiratory status: spontaneous breathing and respiratory function stable Cardiovascular status: stable Postop Assessment: no apparent nausea or vomiting Anesthetic complications: no   No notable events documented.  Last Vitals:  Vitals:   07/28/21 0900 07/28/21 0905  BP:  (!) 176/103  Pulse: 84 67  Resp: (!) 25 12  Temp:    SpO2: 98% 100%    Last Pain:  Vitals:   07/28/21 0859  TempSrc: Oral  PainSc: 0-No pain                 Eino Whitner DANIEL

## 2021-07-28 NOTE — Discharge Instructions (Signed)
YOU HAD AN ENDOSCOPIC PROCEDURE TODAY: Refer to the procedure report and other information in the discharge instructions given to you for any specific questions about what was found during the examination. If this information does not answer your questions, please call [place] office at [phone] to clarify.   YOU SHOULD EXPECT: Some feelings of bloating in the abdomen. Passage of more gas than usual. Walking can help get rid of the air that was put into your GI tract during the procedure and reduce the bloating. If you had a lower endoscopy (such as a colonoscopy or flexible sigmoidoscopy) you may notice spotting of blood in your stool or on the toilet paper. Some abdominal soreness may be present for a day or two, also.  DIET: Your first meal following the procedure should be a light meal and then it is ok to progress to your normal diet. A half-sandwich or bowl of soup is an example of a good first meal. Heavy or fried foods are harder to digest and may make you feel nauseous or bloated. Drink plenty of fluids but you should avoid alcoholic beverages for 24 hours.   ACTIVITY: Your care partner should take you home directly after the procedure. You should plan to take it easy, moving slowly for the rest of the day. You can resume normal activity the day after the procedure however YOU SHOULD NOT DRIVE, use power tools, machinery or perform tasks that involve climbing or major physical exertion for 24 hours (because of the sedation medicines used during the test).   SYMPTOMS TO REPORT IMMEDIATELY: A gastroenterologist can be reached at any hour. Please call [phone]  for any of the following symptoms:  Following upper endoscopy (EGD, EUS, ERCP, esophageal dilation) Vomiting of blood or coffee ground material  New, significant abdominal pain  New, significant chest pain or pain under the shoulder blades  Painful or persistently difficult swallowing  New shortness of breath  Black,  tarry-looking or red, bloody stools  FOLLOW UP:  If any biopsies were taken you will be contacted by phone or by letter within the next 1-3 weeks. Call [phone]  if you have not heard about the biopsies in 3 weeks.  Please also call with any specific questions about appointments or follow up tests.  

## 2021-07-28 NOTE — Op Note (Signed)
[facility] Patient Name: [name] Procedure Date: 07/28/2021 MRN: [id number] Attending MD: Milus Banister , MD Date of Birth: Jan 05, 1956 CSN: [id number] Age: 66 Admit Type: Outpatient Procedure:                ERCP Indications:              Etoh cirrhosis (still drinking), dilated biliary                            tree, suggestion of CBD stones/debris on MRI,                            normal LFTs Providers:                Milus Banister, MD, Jeanella Cara, RN,                            Tyna Jaksch Technician, Despina Pole,                            Technician, Cleda Daub, CRNA Referring MD:              Medicines:                Monitored Anesthesia Care, Indomethacin 100 mg PR,                            Cipro 270 mg IV Complications:            No immediate complications. Estimated blood loss:                            None Estimated Blood Loss:     Estimated blood loss: none. Procedure:                Pre-Anesthesia Assessment:                           - Prior to the procedure, a History and Physical                            was performed, and patient medications and                            allergies were reviewed. The patient's tolerance of                            previous anesthesia was also reviewed. The risks                            and benefits of the procedure and the sedation                            options and risks were discussed with the patient.                            All questions were answered, and informed  consent                            was obtained. Prior Anticoagulants: The patient has                            taken no previous anticoagulant or antiplatelet                            agents. ASA Grade Assessment: IV - A patient with                            severe systemic disease that is a constant threat                            to life. After reviewing the risks and benefits,                             the patient was deemed in satisfactory condition to                            undergo the procedure.                           After obtaining informed consent, the scope was                            passed under direct vision. Throughout the                            procedure, the patient's blood pressure, pulse, and                            oxygen saturations were monitored continuously. The                            [id number]-Q190V ([id number] Olympus duodenoscope was                            introduced through the mouth, and used to inject                            contrast into and used to inject contrast into the                            bile duct and ventral pancreatic duct. The ERCP was                            accomplished without difficulty. The patient                            tolerated the procedure well. Scope In: Scope Out: Findings:      The scout film was normal. I advanced the duodenoscope to the region of  the major papilla without detailed examination of the upper GI tract.       The major papilla was normal. I used a 44 autotome over 0.035 Hydra wire       to attempt biliary cannulation briefly, without success. I then used a       Revolution tome over a 0.025 Revolution wire to cannulate the bile duct       while the 0.035 Hydra wire remained in the main pancreatic duct. I then       removed the Hydra wire from the main pancreatic duct and turned       attention to the biliary tree injecting contrast to obtain a       cholangiogram. The cholangiogram showed proximally dilated common       hepatic duct up to 1.2 cm with long gradual smooth tapering of the duct       into the head of the pancreas. There was not obvious macroscopic stone       disease. The cystic duct partially opacified. The intrahepatic ducts did       not opacify by intention. There were no obvious strictures. Given the       MRI report I elected to perform a biliary  sphincterotomy and swept the       duct several times using a 9 to 12 mm biliary retrieval balloon. This       delivered some small biliary debris into the duodenum without purulence.       Completion occlusion cholangiogram showed no retained filling defects in       the bile duct. Impression:               - Dilated proximal extrahepatic biliary tree, this                            is likely from from small amount of sludge which                            was removed by biliary sphincterotomy and balloon                            sweeping, but she may have had a hypertrophic                            biliary sphincter. Moderate Sedation:      Not Applicable - Patient had care per Anesthesia. Recommendation:           - Discharge patient to home.                           - Dr. Ardis Hughs office to contact her about follow-up                            appointment. Will consider referral to general                            surgery to consider elective cholecystectomy at                            that time.                           -  Most important is that she stop drinking alcohol. Procedure Code(s):        --- Professional ---                           [phone], Endoscopic retrograde                            cholangiopancreatography (ERCP); with                            sphincterotomy/papillotomy Diagnosis Code(s):        --- Professional ---                           K80.50, Calculus of bile duct without cholangitis                            or cholecystitis without obstruction CPT copyright 2019 American Medical Association. All rights reserved. The codes documented in this report are preliminary and upon coder review may  be revised to meet current compliance requirements. Milus Banister, MD 07/28/2021 8:54:18 AM This report has been signed electronically. Number of Addenda: 0

## 2021-07-28 NOTE — Transfer of Care (Signed)
Immediate Anesthesia Transfer of Care Note  Patient: [name]  Procedure(s) Performed: ENDOSCOPIC RETROGRADE CHOLANGIOPANCREATOGRAPHY (ERCP) WITH PROPOFOL SPHINCTEROTOMY REMOVAL OF STONES  Patient Location: PACU  Anesthesia Type:General  Level of Consciousness: awake, alert , oriented and patient cooperative  Airway & Oxygen Therapy: Patient Spontanous Breathing and Patient connected to face mask oxygen  Post-op Assessment: Report given to RN and Post -op Vital signs reviewed and stable  Post vital signs: Reviewed and stable  Last Vitals:  Vitals Value Taken Time  BP    Temp 36.6 C 07/28/21 0859  Pulse 78 07/28/21 0900  Resp 23 07/28/21 0900  SpO2 99 % 07/28/21 0900  Vitals shown include unvalidated device data.  Last Pain:  Vitals:   07/28/21 0859  TempSrc: Oral  PainSc: 0-No pain         Complications: No notable events documented.

## 2021-07-28 NOTE — Anesthesia Procedure Notes (Addendum)
Procedure Name: Intubation Date/Time: 07/28/2021 7:51 AM Performed by: Cleda Daub, CRNA Pre-anesthesia Checklist: Patient identified, Emergency Drugs available, Suction available and Patient being monitored Patient Re-evaluated:Patient Re-evaluated prior to induction Oxygen Delivery Method: Circle system utilized Preoxygenation: Pre-oxygenation with 100% oxygen Induction Type: IV induction Ventilation: Mask ventilation without difficulty Laryngoscope Size: Miller and 2 Grade View: Grade I Tube type: Oral Tube size: 7.0 mm Number of attempts: 2 (Grade 1 view with Mac 3; unable to advance ETT; Dr. Tobias Alexander intubated with Sabra Heck 2 assisted with criociod pressure.) Airway Equipment and Method: Stylet and Oral airway Placement Confirmation: ETT inserted through vocal cords under direct vision, positive ETCO2 and breath sounds checked- equal and bilateral Secured at: 21 cm Tube secured with: Tape Dental Injury: Injury to lip  Comments: A small lip chip found after intubation; ointment applied.

## 2021-07-28 NOTE — H&P (Signed)
HPI: This is a woman with etoh cirrhosis, still drinking.  Found to have dilated biliary tree and sludge, stones in CBD.  Intermittently elevated liver tests, never very high.  ROS: complete GI ROS as described in HPI, all other review negative.  Constitutional:  No unintentional weight loss   Past Medical History:  Diagnosis Date   Anxiety    Cancer ([place]) 04/2014   squamous cell carcinoma L inner thigh   Depression    Hard of hearing    Hypertension    Osteopenia    Substance abuse ([facility])    TIA (transient ischemic attack) 2016    Past Surgical History:  Procedure Laterality Date   AUGMENTATION MAMMAPLASTY Bilateral 1988   BREAST ENHANCEMENT SURGERY     BUNIONECTOMY     GUM SURGERY  11/2017   NOSE SURGERY     Epistaxis   TONSILLECTOMY      Current Facility-Administered Medications  Medication Dose Route Frequency Provider Last Rate Last Admin   lactated ringers infusion   Intravenous Continuous Milus Banister, MD        Allergies as of 06/14/2021 - Review Complete 05/20/2021  Allergen Reaction Noted   Codeine Other (See Comments) 10/22/2007    Family History  Problem Relation Age of Onset   Hypertension Mother    Kidney disease Mother        dialysis   Alzheimer's disease Mother    Hyperlipidemia Father    Stroke Paternal Grandmother    Rheum arthritis Paternal Grandmother    Breast cancer Neg Hx    Colon cancer Neg Hx    Esophageal cancer Neg Hx    Rectal cancer Neg Hx    Stomach cancer Neg Hx     Social History   Socioeconomic History   Marital status: Divorced    Spouse name: Not on file   Number of children: 0   Years of education: 12   Highest education level: 12th grade  Occupational History   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  Tobacco Use   Smoking status: Never    Passive exposure: Never   Smokeless tobacco: Never  Vaping Use   Vaping Use: Never used  Substance and Sexual Activity   Alcohol use: Yes    Alcohol/week: 4.0 -  6.0 standard drinks    Types: 4 - 6 Cans of beer per week    Comment: 3 times a week, states hx of heavy use 15 years ago   Drug use: No   Sexual activity: Not Currently    Partners: Male  Other Topics Concern   Not on file  Social History Narrative   Exercise--- gym 6x a week   Social Determinants of Health   Financial Resource Strain: Low Risk    Difficulty of Paying Living Expenses: Not hard at all  Food Insecurity: No Food Insecurity   Worried About Charity fundraiser in the Last Year: Never true   Ran Out of Food in the Last Year: Never true  Transportation Needs: No Transportation Needs   Lack of Transportation (Medical): No   Lack of Transportation (Non-Medical): No  Physical Activity: Sufficiently Active   Days of Exercise per Week: 6 days   Minutes of Exercise per Session: 50 min  Stress: Stress Concern Present   Feeling of Stress : To some extent  Social Connections: Moderately Integrated   Frequency of Communication with Friends and Family: More than three times a week   Frequency of  Social Gatherings with Friends and Family: More than three times a week   Attends Religious Services: More than 4 times per year   Active Member of [employer] or Organizations: Yes   Attends Archivist Meetings: 1 to 4 times per year   Marital Status: Divorced  Human resources officer Violence: Not At Risk   Fear of Current or Ex-Partner: No   Emotionally Abused: No   Physically Abused: No   Sexually Abused: No     Physical Exam: BP (!) 211/101 Comment: Dr. Tobias Alexander aware   Pulse 65    Temp 97.8 F (36.6 C) (Temporal)    Resp 13    Ht 5\' 1"  (1.549 m)    Wt 47.6 kg    SpO2 100%    BMI 19.84 kg/m  Constitutional: generally well-appearing Psychiatric: alert and oriented x3 Abdomen: soft, nontender, nondistended, no obvious ascites, no peritoneal signs, normal bowel sounds No peripheral edema noted in lower extremities  Assessment and plan: 66 y.o. female with dilated biliary tree  containing sludge, stones  ERCP today  Please see the "Patient Instructions" section for addition details about the plan.  Owens Loffler, MD [facility] 07/28/2021, 7:14 AM

## 2021-07-29 ENCOUNTER — Encounter (HOSPITAL_COMMUNITY): Payer: Self-pay | Admitting: Gastroenterology

## 2021-07-29 ENCOUNTER — Ambulatory Visit: Payer: BC Managed Care – PPO | Admitting: Gastroenterology

## 2021-09-20 ENCOUNTER — Ambulatory Visit: Payer: BC Managed Care – PPO | Admitting: Gastroenterology

## 2021-09-20 ENCOUNTER — Encounter: Payer: Self-pay | Admitting: Gastroenterology

## 2021-09-20 VITALS — BP 118/80 | HR 84 | Ht 60.0 in | Wt 107.0 lb

## 2021-09-20 DIAGNOSIS — K7030 Alcoholic cirrhosis of liver without ascites: Secondary | ICD-10-CM

## 2021-09-20 NOTE — Patient Instructions (Signed)
If you are age 65 or older, your body mass index should be between 23-30. Your Body mass index is 20.9 kg/m?Marland Kitchen If this is out of the aforementioned range listed, please consider follow up with your Primary Care Provider. ? ?If you are age 31 or younger, your body mass index should be between 19-25. Your Body mass index is 20.9 kg/m?Marland Kitchen If this is out of the aformentioned range listed, please consider follow up with your Primary Care Provider.  ? ?________________________________________________________ ? ?The [facility] providers would like to encourage you to use [facility] to communicate with providers for non-urgent requests or questions.  Due to long hold times on the telephone, sending your provider a message by [facility] may be a faster and more efficient way to get a response.  Please allow 48 business hours for a response.  Please remember that this is for non-urgent requests.  ?_______________________________________________________ ? ?You will need a right upper quadrant ultrasound, lab work, and a follow up in 6 months.  We will contact you to get this scheduled. ? ?Thank you for entrusting me with your care and choosing [facility]. ? ?Dr Ardis Hughs ? ?

## 2021-09-20 NOTE — Progress Notes (Signed)
Review of pertinent gastrointestinal problems: ?1.  Routine risk for colon cancer.  Colonoscopy October 2019 found no polyps.  Repeat screening examination recommended at 10-year interval ?2.  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 pylori, no signs of portal hypertension ?Meld score 6 (07/2021 labs)\ ?Ultrasound October 2021 "fatty liver with sonographic findings of cirrhosis.  Dilation of the common bile duct (78m), further evaluation with MRI, MRCP recommended.  Ultrasound 03/2021 nodular liver, no lesions.  9 mm bile duct.  MRI with MRCP 05/2021 reported "moderate to marked biliary duct distention of intra and extrahepatic biliary tree with sludge in the gallbladder and suspected sludge and/or small stones in the distal common bile duct." ?No ascites, normal platelets.   ?3.  Dilated bile duct; 2022, 2023 see above.  ERCP January 2023 Dr. JArdis Hughsperformed biliary sphincterotomy, swept the duct several times and removed no purulence, there was a small amount of biliary debris. ? ?HPI: ?This is a very pleasant 66year old woman ? ?I last saw her at the time of an ERCP about 2 months ago.  See that results summarized above. ? ?Her weight is up 3 pounds since her last office visit here 4 months ago ? ?She is drinking 2 beers every week or 2 only. ? ?She bought 2 infrared lights because she has heard that infrared light can help cirrhosis. ? ?She inquired about her liver staging. ? ? ?ROS: complete GI ROS as described in HPI, all other review negative. ? ?Constitutional:  No unintentional weight loss ? ? ?Past Medical History:  ?Diagnosis Date  ? Anxiety   ? Cancer ([place] 04/2014  ? squamous  cell carcinoma L inner thigh  ? Depression   ? Hard of hearing   ? Hypertension   ? Osteopenia   ? Substance [place] ([place]   ? TIA (transient ischemic attack) 2016  ? ? ?Past Surgical History:  ?Procedure Laterality Date  ? AUGMENTATION MAMMAPLASTY Bilateral 1988  ? BREAST ENHANCEMENT SURGERY    ? BUNIONECTOMY    ? ENDOSCOPIC RETROGRADE CHOLANGIOPANCREATOGRAPHY (ERCP) WITH PROPOFOL N/A 07/28/2021  ? Procedure: ENDOSCOPIC RETROGRADE CHOLANGIOPANCREATOGRAPHY (ERCP) WITH PROPOFOL;  Surgeon: JMilus Banister MD;  Location: [facility] ENDOSCOPY;  Service: Endoscopy;  Laterality: N/A;  ? GUM SURGERY  11/2017  ? NOSE SURGERY    ? Epistaxis  ? REMOVAL OF STONES  07/28/2021  ? Procedure: REMOVAL OF STONES;  Surgeon: JMilus Banister MD;  Location: [facility] ENDOSCOPY;  Service: Endoscopy;;  ? SPHINCTEROTOMY  07/28/2021  ? Procedure: SPHINCTEROTOMY;  Surgeon: JMilus Banister MD;  [facility];  Service: Endoscopy;;  ? TONSILLECTOMY    ? ? ?Current Outpatient Medications  ?Medication Instructions  ? folic acid (FOLVITE) 1 mg, Oral, Daily  ? lisinopril (ZESTRIL) 40 MG tablet TAKE 1 TABLET BY MOUTH EVERY DAY  ? magnesium oxide (MAG-OX) 400 mg, Oral, 2 times daily  ? melatonin 5 mg, Oral, At bedtime PRN  ? metoprolol succinate (TOPROL-XL) 100 MG 24 hr tablet TAKE 1 TABLET BY MOUTH DAILY. TAKE WITH OR IMMEDIATELY FOLLOWING A MEAL.  ? Multiple Vitamin (MULTIVITAMIN WITH MINERALS) TABS tablet 1 tablet, Oral, Daily  ? urea (URE-NA) 15 g, Oral, Daily, TAKE IT UNTIL YOU HAVE CONSISTENT SOLUTE INTAKE then  STOP it once eating food well.  ? Vitamin B1 100 mg, Oral, Daily  ? ? ?Allergies as of 09/20/2021 - Review Complete 09/20/2021  ?Allergen Reaction Noted  ? Codeine Other (See Comments) 10/22/2007  ? ? ?Family History  ?Problem Relation Age of Onset  ? Hypertension Mother   ? Kidney disease Mother   ?     dialysis  ? Alzheimer's disease Mother   ? Hyperlipidemia Father   ? Stroke Paternal Grandmother   ? Rheum arthritis Paternal Grandmother    ? Breast cancer Neg Hx   ? Colon cancer Neg Hx   ? Esophageal cancer Neg Hx   ? Rectal cancer Neg Hx   ? Stomach cancer Neg Hx   ? ? ?Social History  ? ?Socioeconomic History  ? Marital status: Divorced  ?  Spouse name: Not on file  ? Number of children: 0  ? Years of education: 48  ? Highest education level: 12th grade  ?Occupational History  ? 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 Smoking status: Never  ?  Passive exposure: Never  ? Smokeless tobacco: Never  ?Vaping Use  ? Vaping Use: Never used  ?Substance and Sexual Activity  ? Alcohol use: Yes  ?  Alcohol/week: 4.0 - 6.0 standard drinks  ?  Types: 4 - 6 Cans of beer per week  ?  Comment: 3 times a week, states hx of heavy use 15 years ago  ? Drug use: No  ? Sexual activity: Not Currently  ?  Partners: Male  ?Other Topics Concern  ? Not on file  ?Social History Narrative  ? Exercise--- gym 6x a week  ? ?Social Determinants of Health  ? ?Financial Resource Strain: Low Risk   ? Difficulty of Paying Living Expenses: Not hard at all  ?Food Insecurity: No Food Insecurity  ? Worried About Charity fundraiser in the Last Year: Never true  ? Ran Out of Food in the Last Year: Never true  ?Transportation Needs: No Transportation Needs  ? Lack of Transportation (Medical): No  ? Lack of Transportation (Non-Medical): No  ?Physical Activity: Sufficiently Active  ? Days of Exercise per Week: 6 days  ? Minutes of Exercise per Session: 50 min  ?Stress: Stress Concern Present  ? Feeling of Stress : To some extent  ?Social Connections: Moderately Integrated  ? Frequency of Communication with Friends and Family: More than three times a week  ? Frequency of Social Gatherings with Friends and Family: More than three times a week  ? Attends Religious Services: More than 4 times per year  ? Active Member of Clubs or Organizations: Yes  ? Attends Archivist Meetings: 1 to 4 times per year  ? Marital Status: Divorced  ?Intimate Partner Violence: Not At Risk   ? Fear of Current or Ex-Partner: No  ? Emotionally Abused: No  ? Physically Abused: No  ? Sexually Abused: No  ? ? ? ?Physical Exam: ?BP 118/80 (BP Location: Right Arm, Patient Position: Sitting, Cuff Size: Normal)   Ht 5' (1.524 m)   Wt 107 lb (48.5 kg)   BMI 20.90 kg/m?  ?Constitutional: generally well-appearing ?Psychiatric: alert and oriented x3 ?Abdomen: soft, nontender, nondistended, no obvious ascites, no peritoneal signs, normal bowel sounds ?No peripheral edema noted in lower extremities ? ?Assessment and plan: ?66 y.o. female with alcoholic cirrhosis, well compensated ? ?She has a MELD NA score of 6 which is about as low as  you can score and still have cirrhosis.  This is good news.  She is drinking less.  2 beers every week or 2 only.  I encouraged her to continue that as she certainly appears the most alert and oriented today that I have ever seen her.   ? ?She is having no significant liver issues.  She will return to see me in 6 months with ultrasound and restaging labs of her liver a few days beforehand as well as hepatoma screening with alpha-fetoprotein. ? ?Please see the "Patient Instructions" section for addition details about the plan. ? ?Owens Loffler, MD ?[facility] ?09/20/2021, 11:09 AM ? ? ?Total time on date of encounter was 20 minutes (this included time spent preparing to see the patient reviewing records; obtaining and/or reviewing separately obtained history; performing a medically appropriate exam and/or evaluation; counseling and educating the patient and family if present; ordering medications, tests or procedures if applicable; and documenting clinical information in the health record). ? ?

## 2021-09-22 ENCOUNTER — Encounter: Payer: Self-pay | Admitting: Gastroenterology

## 2021-10-07 NOTE — Telephone Encounter (Signed)
error 

## 2021-10-27 IMAGING — CT CT HEAD W/O CM
3 series · 14 of 47 positions shown, 16 images · non-contrast
Comparison: 06/18/2015

CLINICAL DATA: Head trauma, moderate severity, numerous falls over
the past few months

EXAM:
CT HEAD WITHOUT CONTRAST
TECHNIQUE: Contiguous axial images were obtained from the base of the skull
through the vertex without intravenous contrast.

[Series 2: head wo · axial · 0.50mm/px · z∈[-137,-12]mm · 8 of 31 slices shown, 10 images]
[im 3/31  brain]
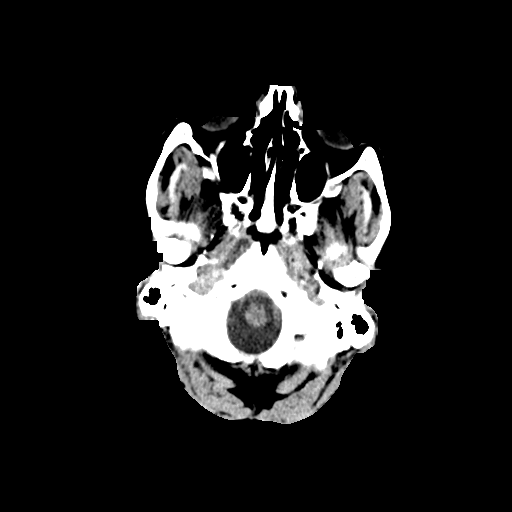
[im 3/31  bone]
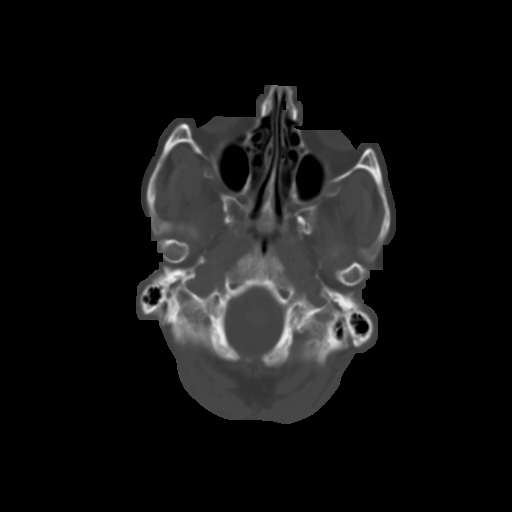
[im 7/31  brain]
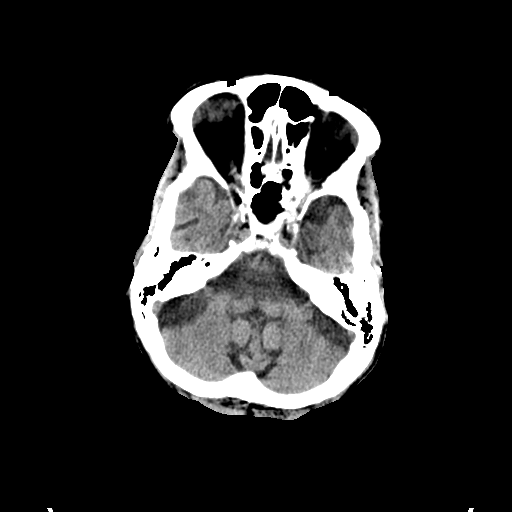
[im 10/31  brain]
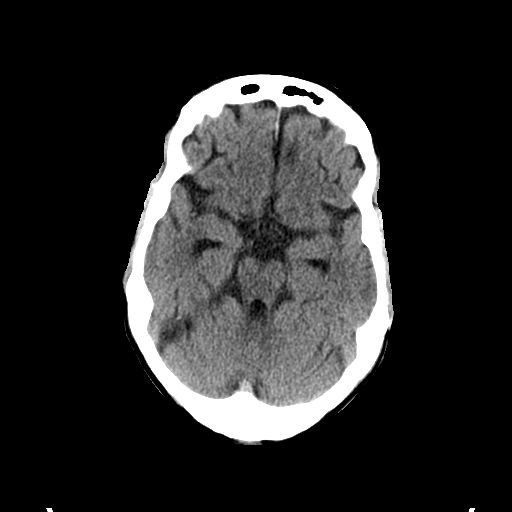
[im 14/31  brain]
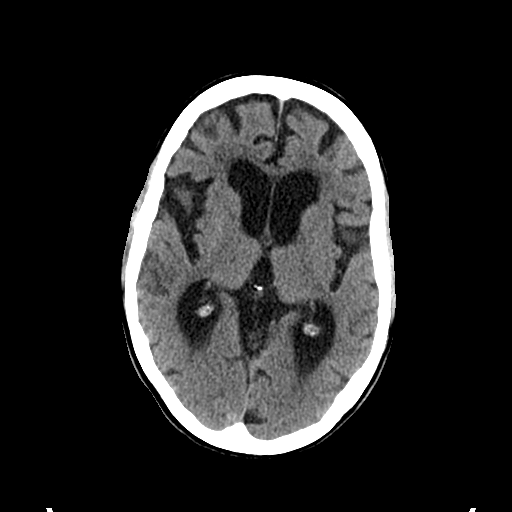
[im 17/31  brain]
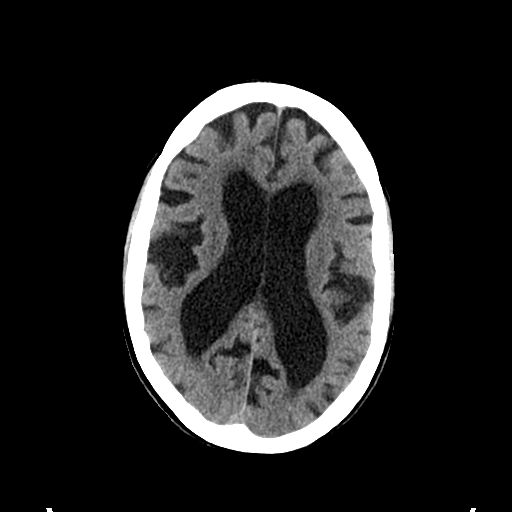
[im 17/31  bone]
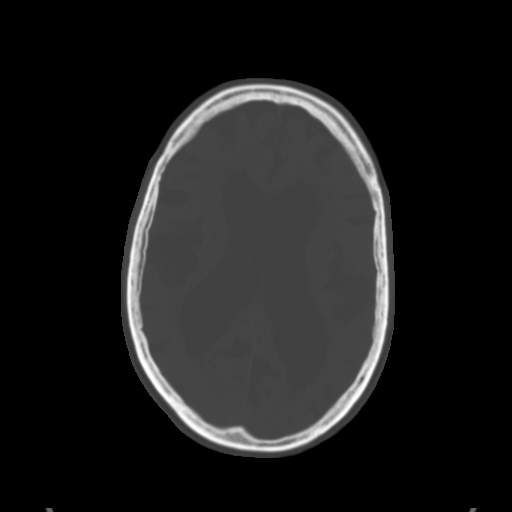
[im 21/31  brain]
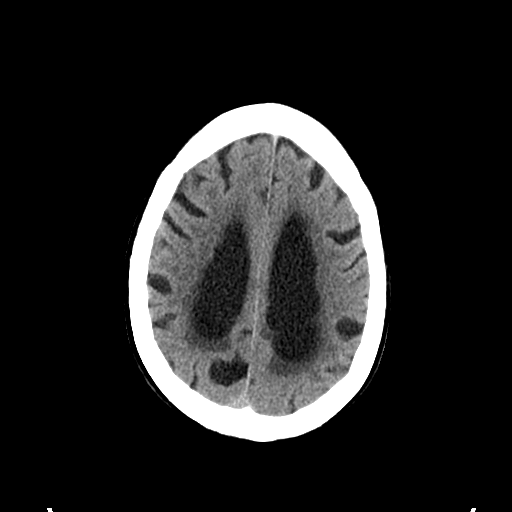
[im 24/31  brain]
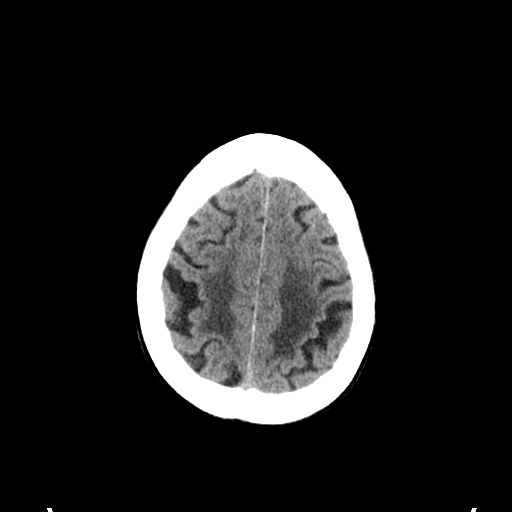
[im 28/31  brain]
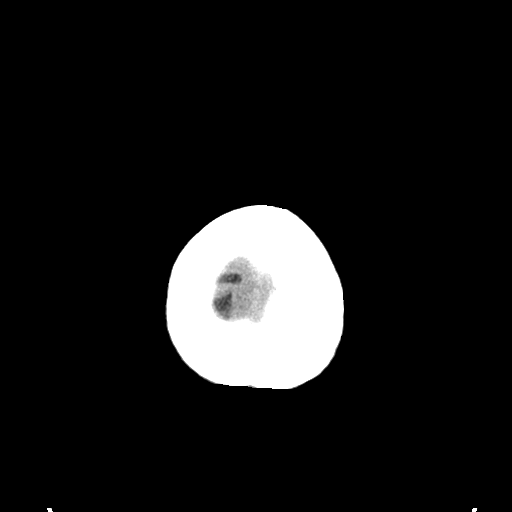

[Series 4: coronal soft tissue · coronal · 0.33mm/px · 3 of 65 slices shown]
[im 22/65  brain]
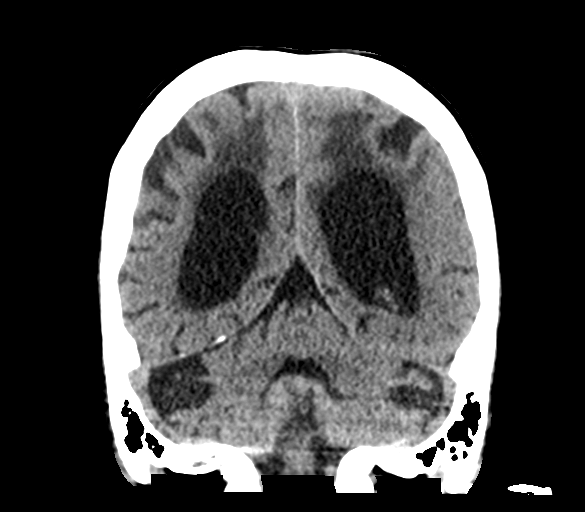
[im 29/65  brain]
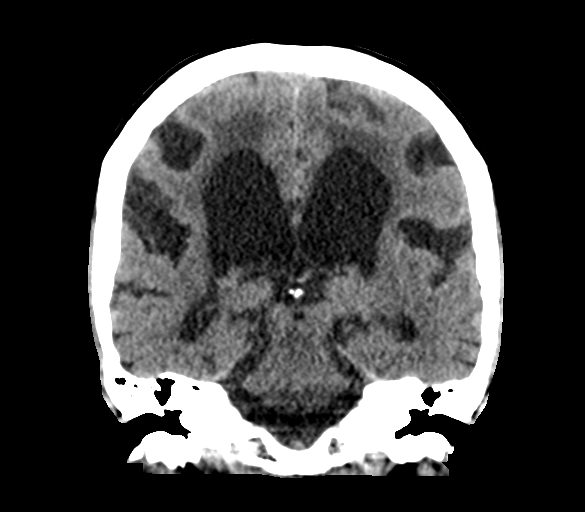
[im 36/65  brain]
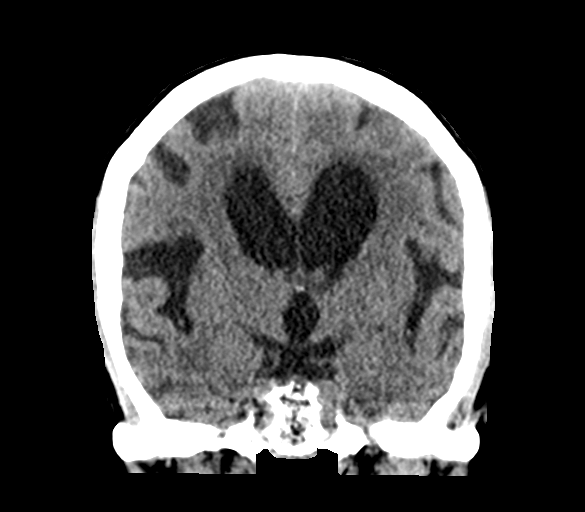

[Series 5: sagittal soft tissue · sagittal · 0.33mm/px · 3 of 48 slices shown]
[im 16/48  brain]
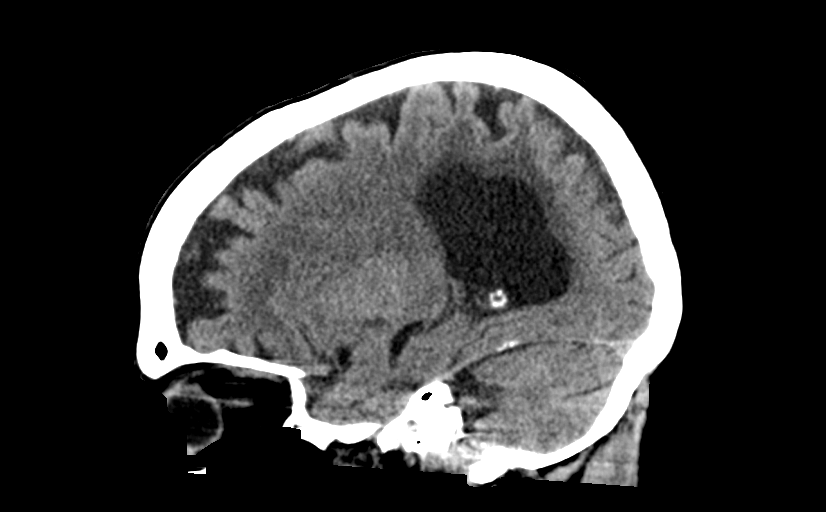
[im 24/48  brain]
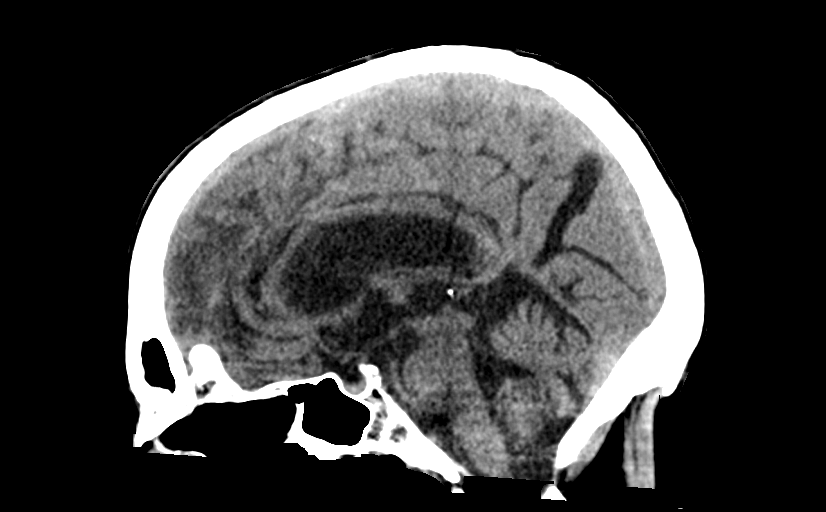
[im 32/48  brain]
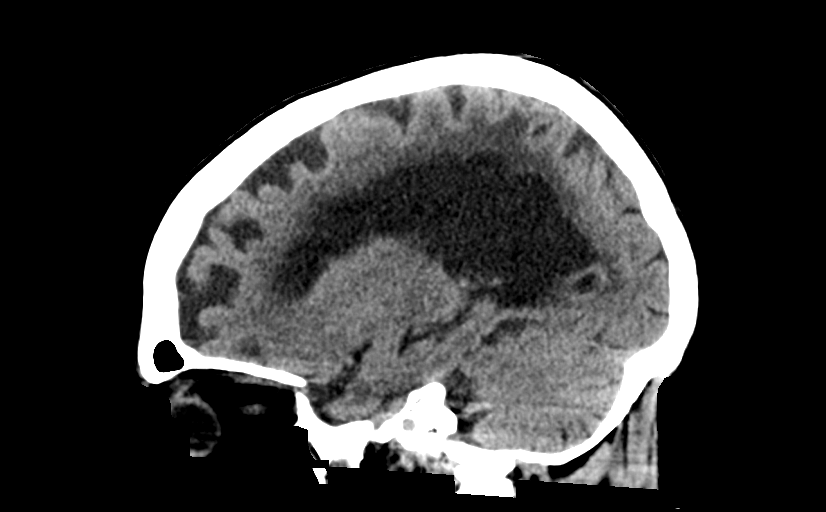

[14 of 47 positions shown; findings below may reference images not displayed]

FINDINGS: Brain: No evidence of acute infarction, hemorrhage, extra-axial
collection, or mass effect. Ventriculomegaly commensurate with the
degree of cerebral atrophy. Generalized cerebral atrophy.
Periventricular white matter low attenuation likely secondary to
microangiopathy.

Vascular: Cerebrovascular atherosclerotic calcifications are noted.

Skull: Negative for fracture or focal lesion.

Sinuses/Orbits: Visualized portions of the orbits are unremarkable.
Visualized portions of the paranasal sinuses are unremarkable.
Visualized portions of the mastoid air cells are unremarkable.

Other: None.
IMPRESSION: 1. No acute intracranial pathology.
2. Chronic microvascular disease and cerebral atrophy.

## 2022-01-20 ENCOUNTER — Encounter: Payer: Self-pay | Admitting: *Deleted

## 2022-01-20 ENCOUNTER — Other Ambulatory Visit: Payer: Self-pay | Admitting: *Deleted

## 2022-01-20 DIAGNOSIS — I10 Essential (primary) hypertension: Secondary | ICD-10-CM

## 2022-01-20 MED ORDER — LISINOPRIL 40 MG PO TABS: 40.0000 mg | ORAL_TABLET | Freq: Every day | ORAL | 0 refills | Status: DC

## 2022-01-20 MED ORDER — METOPROLOL SUCCINATE ER 100 MG PO TB24: 100.0000 mg | ORAL_TABLET | Freq: Every day | ORAL | 0 refills | Status: DC

## 2022-02-15 ENCOUNTER — Other Ambulatory Visit: Payer: Self-pay

## 2022-02-15 DIAGNOSIS — K7460 Unspecified cirrhosis of liver: Secondary | ICD-10-CM

## 2022-02-15 DIAGNOSIS — K7030 Alcoholic cirrhosis of liver without ascites: Secondary | ICD-10-CM

## 2022-02-21 ENCOUNTER — Other Ambulatory Visit (HOSPITAL_COMMUNITY): Payer: BC Managed Care – PPO

## 2022-02-23 ENCOUNTER — Ambulatory Visit: Payer: Self-pay | Admitting: Family Medicine

## 2022-02-23 ENCOUNTER — Ambulatory Visit (HOSPITAL_COMMUNITY)
Admission: RE | Admit: 2022-02-23 | Discharge: 2022-02-23 | Disposition: A | Discharge status: Home / Self Care | Payer: Medicare Other | Source: Ambulatory Visit | Attending: Gastroenterology | Admitting: Gastroenterology

## 2022-02-23 DIAGNOSIS — K7030 Alcoholic cirrhosis of liver without ascites: Secondary | ICD-10-CM | POA: Diagnosis present

## 2022-02-23 DIAGNOSIS — K7460 Unspecified cirrhosis of liver: Secondary | ICD-10-CM | POA: Diagnosis not present

## 2022-03-03 ENCOUNTER — Ambulatory Visit: Payer: Self-pay | Admitting: Family Medicine

## 2022-03-21 ENCOUNTER — Ambulatory Visit: Payer: BC Managed Care – PPO | Admitting: Gastroenterology

## 2022-03-30 ENCOUNTER — Encounter: Payer: Self-pay | Admitting: Nurse Practitioner

## 2022-03-30 ENCOUNTER — Ambulatory Visit: Payer: Medicare Other | Admitting: Nurse Practitioner

## 2022-03-30 ENCOUNTER — Other Ambulatory Visit (INDEPENDENT_AMBULATORY_CARE_PROVIDER_SITE_OTHER): Payer: Medicare Other

## 2022-03-30 VITALS — BP 152/86 | HR 72 | Ht 59.25 in | Wt 106.5 lb

## 2022-03-30 DIAGNOSIS — K7030 Alcoholic cirrhosis of liver without ascites: Secondary | ICD-10-CM | POA: Diagnosis not present

## 2022-03-30 DIAGNOSIS — I10 Essential (primary) hypertension: Secondary | ICD-10-CM | POA: Diagnosis not present

## 2022-03-30 DIAGNOSIS — K7460 Unspecified cirrhosis of liver: Secondary | ICD-10-CM | POA: Diagnosis not present

## 2022-03-30 DIAGNOSIS — Z23 Encounter for immunization: Secondary | ICD-10-CM

## 2022-03-30 LAB — PROTIME-INR
INR: 0.9 ratio (ref 0.8–1.0)
Prothrombin Time: 9.9 s (ref 9.6–13.1)

## 2022-03-30 LAB — COMPREHENSIVE METABOLIC PANEL
ALT: 36 U/L — ABNORMAL HIGH (ref 0–35)
AST: 53 U/L — ABNORMAL HIGH (ref 0–37)
Albumin: 5 g/dL (ref 3.5–5.2)
Alkaline Phosphatase: 78 U/L (ref 39–117)
BUN: 7 mg/dL (ref 6–23)
CO2: 25 mEq/L (ref 19–32)
Calcium: 10.5 mg/dL (ref 8.4–10.5)
Chloride: 91 mEq/L — ABNORMAL LOW (ref 96–112)
Creatinine, Ser: 0.69 mg/dL (ref 0.40–1.20)
GFR: 91.19 mL/min (ref >60.00)
Glucose, Bld: 82 mg/dL (ref 70–99)
Potassium: 4.7 mEq/L (ref 3.5–5.1)
Sodium: 130 mEq/L — ABNORMAL LOW (ref 135–145)
Total Bilirubin: 0.6 mg/dL (ref 0.2–1.2)
Total Protein: 8.5 g/dL — ABNORMAL HIGH (ref 6.0–8.3)

## 2022-03-30 LAB — CBC
HCT: 41.4 % (ref 36.0–46.0)
Hemoglobin: 14.5 g/dL (ref 12.0–15.0)
MCHC: 35 g/dL (ref 30.0–36.0)
MCV: 110.5 fl — ABNORMAL HIGH (ref 78.0–100.0)
Platelets: 258 10*3/uL (ref 150.0–400.0)
RBC: 3.75 Mil/uL — ABNORMAL LOW (ref 3.87–5.11)
RDW: 12.9 % (ref 11.5–15.5)
WBC: 4.4 10*3/uL (ref 4.0–10.5)

## 2022-03-30 NOTE — Patient Instructions (Addendum)
_______________________________________________________  If you are age 65 or older, your body mass index should be between 23-30. Your Body mass index is 21.33 kg/m. If this is out of the aforementioned range listed, please consider follow up with your Primary Care Provider. ________________________________________________________  The [facility] providers would like to encourage you to use [facility] to communicate with providers for non-urgent requests or questions.  Due to long hold times on the telephone, sending your provider a message by [facility] may be a faster and more efficient way to get a response.  Please allow 48 business hours for a response.  Please remember that this is for non-urgent requests.  _______________________________________________________  Your provider has requested that you go to the basement level for lab work before leaving today. Press "B" on the elevator. The lab is located at the first door on the left as you exit the elevator.  You had Hepatitis A vaccine today.  You will need the 2nd Hep A vaccine in 6 months (March 2023).  We will contact you to get this scheduled.  You will need a follow up appointment in 6 months (March 2023). We will contact you to get this scheduled.   Thank you for entrusting me with your care and choosing [facility].  Tye Savoy, NP

## 2022-03-30 NOTE — Progress Notes (Signed)
Chief Complaint: Follow-up on cirrhosis   Assessment &  Plan   # 66 yo female with Etoh cirrhosis without evidence for portal hypertension. Unable to calculate MELD sodium as she did not get the lab work done prior to this procedure.  Abstinent from [place] since Sept 22. Ascites: No history of ascites.  Hepatic encephalopathy: No history.  No signs of hepatic encephalopathy on exam today Varices screening/surveillance: No esophageal varices on EGD January 2022 [facility] screening:  [place] in August was negative for liver lesions HAV- not immune. She agrees to get vaccine today. HBV ab reactive in 2021 ( got vaccine) Follow-up in 6 months, sooner if needed  HPI   [name] is a 66 y.o. female known to Dr.  Ardis Hughs with a past medical history significant for alcoholic cirrhosis. See PMH /PSH for additional history   She was last seen by Dr. Ardis Hughs in March 2023 for ongoing care of compensated alcoholic cirrhosis.  At that time her MELD sodium score was 6.  She was still drinking but only about 2 beers a week.  She was advised to follow-up in 6 months with an ultrasound and labs a few days prior.   Interval History: She lives alone , does not drive .  She is accompanied by a friend.    RUQ ultrasound on 02/23/2022 showed a cirrhotic liver, unchanged.  No liver masses.  Common bile duct normal in caliber.  Gallbladder unremarkable.  She has not yet had her labs drawn which will include a CBC, c-Met, INR and AFP  [name] feels okay.  No nausea, vomiting, abdominal pain or bowel changes. No blood in stool.  Abstinent from alcohol for a year.  She admits to some memory problems.   Labs:      Latest Ref Rng & Units 07/25/2021    8:45 AM 06/06/2021    1:40 PM 05/10/2021   11:57 AM  CBC  WBC 4.0 - 10.5 K/uL 5.0  6.6  7.7   Hemoglobin 12.0 - 15.0 g/dL 14.4  13.2  13.0   Hematocrit 36.0 - 46.0 % 42.2  39.7  38.4   Platelets 150.0 - 400.0 K/uL 237.0  317.0  362.0        Latest Ref Rng &  Units 07/25/2021    8:45 AM 06/06/2021    1:40 PM 04/08/2021    1:17 AM  Hepatic Function  Total Protein 6.0 - 8.3 g/dL 8.4  7.7    7.7    Albumin 3.5 - 5.2 g/dL 5.1  4.9    4.9  3.0   AST 0 - 37 U/L 66  20    20    ALT 0 - 35 U/L 46  12    12    Alk Phosphatase 39 - 117 U/L 76  63    63    Total Bilirubin 0.2 - 1.2 mg/dL 0.7  0.6    0.6    Bilirubin, Direct 0.0 - 0.3 mg/dL  0.1      Endoscopic Evaluation:  January 2022 EGD for varices screening - Mild, non-specific gastritis, biopsied to check for H. pylori. - The examination was otherwise normal. - No signs of portal hypertension. Surgical [P], gastric antrum and gastric body - REACTIVE GASTROPATHY. [place] IS NEGATIVE FOR HELICOBACTER PYLORI. - NO INTESTINAL METAPLASIA, DYSPLASIA, OR MALIGNANCY  Imaging  02/14/2022 RUQ [place]  RUQ ultrasound on 02/23/2022 showed a cirrhotic liver, unchanged.  No liver masses.  Common bile duct  normal in caliber.  Gallbladder unremarkable.   Past Medical History:  Diagnosis Date   Anxiety    Cancer ([place]) 04/2014   squamous cell carcinoma L inner thigh   Depression    Hard of hearing    Hypertension    Osteopenia    Substance abuse ([place])    TIA (transient ischemic attack) 2016    Past Surgical History:  Procedure Laterality Date   AUGMENTATION MAMMAPLASTY Bilateral 1988   BREAST ENHANCEMENT SURGERY     BUNIONECTOMY     ENDOSCOPIC RETROGRADE CHOLANGIOPANCREATOGRAPHY (ERCP) WITH PROPOFOL N/A 07/28/2021   Procedure: ENDOSCOPIC RETROGRADE CHOLANGIOPANCREATOGRAPHY (ERCP) WITH PROPOFOL;  Surgeon: Milus Banister, MD;  Location: [facility] ENDOSCOPY;  Service: Endoscopy;  Laterality: N/A;   GUM SURGERY  11/2017   NOSE SURGERY     Epistaxis   REMOVAL OF STONES  07/28/2021   Procedure: REMOVAL OF STONES;  Surgeon: Milus Banister, MD;  Location: [facility] ENDOSCOPY;  Service: Endoscopy;;   SPHINCTEROTOMY  07/28/2021   Procedure: Joan Mayans;  Surgeon: Milus Banister, MD;  [facility]  ENDOSCOPY;  Service: Endoscopy;;   TONSILLECTOMY      Current Medications, Allergies, Family History and Social History were reviewed in [place] record.     Current Outpatient Medications  Medication Sig Dispense Refill   folic acid (FOLVITE) 1 MG tablet Take 1 tablet (1 mg total) by mouth daily.     lisinopril (ZESTRIL) 40 MG tablet Take 1 tablet (40 mg total) by mouth daily. 90 tablet 0   magnesium oxide (MAG-OX) 400 (240 Mg) MG tablet Take 1 tablet (400 mg total) by mouth 2 (two) times daily. (Patient taking differently: Take 400 mg by mouth daily.)     melatonin 5 MG TABS Take 1 tablet (5 mg total) by mouth at bedtime as needed.  0   metoprolol succinate (TOPROL-XL) 100 MG 24 hr tablet Take 1 tablet (100 mg total) by mouth daily. TAKE WITH OR IMMEDIATELY FOLLOWING A MEAL. 90 tablet 0   Multiple Vitamin (MULTIVITAMIN WITH MINERALS) TABS tablet Take 1 tablet by mouth daily.     Thiamine HCl (VITAMIN B1) 100 MG TABS Take 100 mg by mouth daily.     urea (URE-NA) 15 g PACK oral packet Take 15 g by mouth daily. TAKE IT UNTIL YOU HAVE CONSISTENT SOLUTE INTAKE then STOP it once eating food well. (Patient taking differently: Take 15 g by mouth every other day. TAKE IT UNTIL YOU HAVE CONSISTENT SOLUTE INTAKE then STOP it once eating food well.)     No current facility-administered medications for this visit.    Review of Systems: No chest pain. No shortness of breath. No urinary complaints.    Physical Exam  Wt Readings from Last 3 Encounters:  09/20/21 107 lb (48.5 kg)  07/28/21 105 lb (47.6 kg)  05/20/21 104 lb (47.2 kg)    BP (!) 152/86 (BP Location: Left Arm, Patient Position: Sitting, Cuff Size: Normal)   Pulse 72   Ht 4' 11.25" (1.505 m)   Wt 106 lb 8 oz (48.3 kg)   BMI 21.33 kg/m  Constitutional: Frail female in no acute distress. Psychiatric: Pleasant. Normal mood and affect. Behavior is normal. EENT: Pupils normal.  Conjunctivae are normal. No  scleral icterus. Neck supple.  Cardiovascular: Normal rate, regular rhythm. No edema Pulmonary/chest: Effort normal and breath sounds normal. No wheezing, rales or rhonchi. Abdominal: Soft, nondistended, nontender. Bowel sounds active throughout. There are no masses palpable. No hepatomegaly. Neurological: Alert  and oriented to person place and time. Skin: Skin is warm and dry. No rashes noted.  Tye Savoy, NP  03/30/2022, 10:00 AM  Cc:  Ann Held, *

## 2022-04-03 LAB — AFP TUMOR MARKER: AFP-Tumor Marker: 6.4 ng/mL — ABNORMAL HIGH

## 2022-04-03 NOTE — Progress Notes (Signed)
Agree with the assessment and plan as outlined by Paula Guenther, NP.  Gillie Crisci E. Dennys Guin, MD [facility]  

## 2022-04-04 ENCOUNTER — Ambulatory Visit: Payer: Self-pay | Admitting: Family Medicine

## 2022-04-04 ENCOUNTER — Other Ambulatory Visit: Payer: Self-pay | Admitting: Family Medicine

## 2022-04-04 DIAGNOSIS — I10 Essential (primary) hypertension: Secondary | ICD-10-CM

## 2022-04-06 ENCOUNTER — Other Ambulatory Visit: Payer: Self-pay | Admitting: Family Medicine

## 2022-04-06 DIAGNOSIS — I10 Essential (primary) hypertension: Secondary | ICD-10-CM

## 2022-04-18 ENCOUNTER — Telehealth: Payer: Self-pay

## 2022-04-18 NOTE — Telephone Encounter (Signed)
-----   Message from Willia Craze, NP sent at 04/13/2022  1:03 PM EDT ----- Yes, should be done in November. Thanks ----- Message ----- From: Marice Potter, RN Sent: 04/12/2022  11:21 AM EDT To: Willia Craze, NP  Just to verify that MRCP needs to be done November 2023 right?  ----- Message ----- From: Willia Craze, NP Sent: 04/11/2022  12:05 PM EDT To: Marice Potter, RN  Mickel Baas,  Please let her know that labs were mostly unchanged. Her AFP ( tumor marker) was slightly elevated. We will monitor closely for now. No liver cancer seen on recent ultrasound.    Please see Dr. Dayle Points notes below. Patient needs alpha fetoprotein in early January 2024. Also needs to be scheduled for an MRCP to be done in Nov 2022 to follow up on pancreatic cysts and also reassess for liver lesions.   Thanks   ----- Message ----- From: Daryel November, MD Sent: 04/05/2022   2:27 PM EDT To: Willia Craze, NP  Nevin Bloodgood,  Can we repeat an AFP in 3 months?  Although her AFP was at a similar level a year ago, we can trend it more closely to make sure it is not increasing.  Also, it appears she had some pancreatic cysts on an MRCP in Nov 2022 and was recommended to repeat in 1 year.  Can you order that for her?  This will also reassess for hepatic lesions.  Thanks ----- Message ----- From: Irving Copas., MD Sent: 04/05/2022  12:27 AM EDT To: Willia Craze, NP; Daryel November, MD  FYI this was in DJs inbox. We will need some follow-up of the AFP. GM ----- Message ----- From: Interface, Lab In Three Zero One Sent: 03/30/2022   2:23 PM EDT To: Milus Banister, MD

## 2022-04-18 NOTE — Telephone Encounter (Signed)
Lm on vm for patient to return call. 70-monthAFP lab reminder sent to LElta Guadeloupe RN. November MRCP reminder sent to LElta Guadeloupe RN.

## 2022-04-20 NOTE — Telephone Encounter (Signed)
Pt returned call. We reviewed her lab results and recommendations. Pt knows that she will receive a reminder call about MRI and labs when she is due. Pt did not have any questions at this time. She was advised to call if she thought of anything. Pt verbalized understanding.

## 2022-04-20 NOTE — Telephone Encounter (Signed)
Lm on vm for patient to return call 

## 2022-05-02 ENCOUNTER — Ambulatory Visit: Payer: Self-pay | Admitting: Family Medicine

## 2022-05-19 ENCOUNTER — Telehealth: Payer: Self-pay

## 2022-05-19 ENCOUNTER — Other Ambulatory Visit: Payer: Self-pay

## 2022-05-19 DIAGNOSIS — K862 Cyst of pancreas: Secondary | ICD-10-CM

## 2022-05-19 NOTE — Telephone Encounter (Signed)
-----   Message from Yevette Edwards, RN sent at 04/18/2022  4:25 PM EDT ----- Regarding: MRCP appt MRCP for pancreatic cysts/ liver lesions due in November 2023. Need to enter order. See 04/18/22 telephone encounter for details

## 2022-05-19 NOTE — Telephone Encounter (Signed)
Order entered for MRCP. Message sent to radiology scheduling.

## 2022-05-26 ENCOUNTER — Other Ambulatory Visit: Payer: Self-pay

## 2022-05-26 DIAGNOSIS — K862 Cyst of pancreas: Secondary | ICD-10-CM

## 2022-06-16 ENCOUNTER — Encounter (HOSPITAL_COMMUNITY): Payer: Self-pay

## 2022-06-16 ENCOUNTER — Ambulatory Visit (HOSPITAL_COMMUNITY): Payer: Medicare Other

## 2022-06-26 ENCOUNTER — Other Ambulatory Visit: Payer: Self-pay | Admitting: Family Medicine

## 2022-06-26 DIAGNOSIS — I10 Essential (primary) hypertension: Secondary | ICD-10-CM

## 2022-07-07 ENCOUNTER — Ambulatory Visit (HOSPITAL_COMMUNITY): Payer: Medicare Other

## 2022-07-07 ENCOUNTER — Ambulatory Visit: Payer: Medicare Other | Admitting: Family Medicine

## 2022-07-18 ENCOUNTER — Ambulatory Visit: Payer: Medicare Other | Admitting: Family Medicine

## 2022-07-18 ENCOUNTER — Ambulatory Visit: Payer: Self-pay | Admitting: Family Medicine

## 2022-07-19 ENCOUNTER — Other Ambulatory Visit: Payer: Self-pay

## 2022-07-19 ENCOUNTER — Telehealth: Payer: Self-pay

## 2022-07-19 DIAGNOSIS — K7460 Unspecified cirrhosis of liver: Secondary | ICD-10-CM

## 2022-07-19 NOTE — Telephone Encounter (Signed)
-----   Message from Yevette Edwards, RN sent at 04/18/2022  4:26 PM EDT ----- Regarding: Labs AFP lab due - need to enter order  See 04/18/22 telephone encounter for details

## 2022-07-19 NOTE — Telephone Encounter (Signed)
Order placed for AFP lab. Attempted to contact pt to let her know about lab test. Left message for pt to call back.

## 2022-07-21 ENCOUNTER — Ambulatory Visit: Payer: Medicare Other | Admitting: Family Medicine

## 2022-07-25 ENCOUNTER — Ambulatory Visit (HOSPITAL_COMMUNITY): Admission: RE | Admit: 2022-07-25 | Payer: Medicare Other | Source: Ambulatory Visit

## 2022-07-25 NOTE — Telephone Encounter (Signed)
Left message for pt to call back.

## 2022-07-27 ENCOUNTER — Telehealth: Payer: Self-pay | Admitting: Family Medicine

## 2022-07-27 NOTE — Telephone Encounter (Signed)
Patient said she has her MRI scheduled for 08/01/22 and they told her that she needed to have an ultrasound and that our office needs to fax the order to them at [phone]. Please call patient to advise.

## 2022-07-27 NOTE — Telephone Encounter (Signed)
Please advise 

## 2022-07-28 ENCOUNTER — Other Ambulatory Visit: Payer: Self-pay | Admitting: Family Medicine

## 2022-07-28 ENCOUNTER — Ambulatory Visit (INDEPENDENT_AMBULATORY_CARE_PROVIDER_SITE_OTHER): Payer: Medicare Other | Admitting: Family Medicine

## 2022-07-28 ENCOUNTER — Encounter: Payer: Self-pay | Admitting: Family Medicine

## 2022-07-28 VITALS — BP 142/98 | HR 72 | Temp 98.3°F | Resp 18 | Ht 59.25 in

## 2022-07-28 DIAGNOSIS — K7030 Alcoholic cirrhosis of liver without ascites: Secondary | ICD-10-CM | POA: Diagnosis not present

## 2022-07-28 DIAGNOSIS — G459 Transient cerebral ischemic attack, unspecified: Secondary | ICD-10-CM

## 2022-07-28 DIAGNOSIS — Z23 Encounter for immunization: Secondary | ICD-10-CM

## 2022-07-28 DIAGNOSIS — I10 Essential (primary) hypertension: Secondary | ICD-10-CM

## 2022-07-28 DIAGNOSIS — R269 Unspecified abnormalities of gait and mobility: Secondary | ICD-10-CM | POA: Diagnosis not present

## 2022-07-28 DIAGNOSIS — S91101A Unspecified open wound of right great toe without damage to nail, initial encounter: Secondary | ICD-10-CM | POA: Diagnosis not present

## 2022-07-28 DIAGNOSIS — E2839 Other primary ovarian failure: Secondary | ICD-10-CM

## 2022-07-28 DIAGNOSIS — R296 Repeated falls: Secondary | ICD-10-CM

## 2022-07-28 DIAGNOSIS — R531 Weakness: Secondary | ICD-10-CM | POA: Diagnosis not present

## 2022-07-28 DIAGNOSIS — Z1231 Encounter for screening mammogram for malignant neoplasm of breast: Secondary | ICD-10-CM

## 2022-07-28 LAB — CBC WITH DIFFERENTIAL/PLATELET
Basophils Absolute: 0.1 10*3/uL (ref 0.0–0.1)
Basophils Relative: 1.2 % (ref 0.0–3.0)
Eosinophils Absolute: 0.1 10*3/uL (ref 0.0–0.7)
Eosinophils Relative: 1 % (ref 0.0–5.0)
HCT: 38.9 % (ref 36.0–46.0)
Hemoglobin: 13.2 g/dL (ref 12.0–15.0)
Lymphocytes Relative: 16.4 % (ref 12.0–46.0)
Lymphs Abs: 1.1 10*3/uL (ref 0.7–4.0)
MCHC: 34.1 g/dL (ref 30.0–36.0)
MCV: 113 fl — ABNORMAL HIGH (ref 78.0–100.0)
Monocytes Absolute: 0.6 10*3/uL (ref 0.1–1.0)
Monocytes Relative: 9.9 % (ref 3.0–12.0)
Neutro Abs: 4.6 10*3/uL (ref 1.4–7.7)
Neutrophils Relative %: 71.5 % (ref 43.0–77.0)
Platelets: 286 10*3/uL (ref 150.0–400.0)
RBC: 3.44 Mil/uL — ABNORMAL LOW (ref 3.87–5.11)
RDW: 13.8 % (ref 11.5–15.5)
WBC: 6.5 10*3/uL (ref 4.0–10.5)

## 2022-07-28 LAB — LIPID PANEL
Cholesterol: 280 mg/dL — ABNORMAL HIGH (ref 0–200)
HDL: 129 mg/dL (ref >39.00)
LDL Cholesterol: 136 mg/dL — ABNORMAL HIGH (ref 0–99)
NonHDL: 151.21
Total CHOL/HDL Ratio: 2
Triglycerides: 75 mg/dL (ref 0.0–149.0)
VLDL: 15 mg/dL (ref 0.0–40.0)

## 2022-07-28 LAB — COMPREHENSIVE METABOLIC PANEL
ALT: 34 U/L (ref 0–35)
AST: 58 U/L — ABNORMAL HIGH (ref 0–37)
Albumin: 4.8 g/dL (ref 3.5–5.2)
Alkaline Phosphatase: 75 U/L (ref 39–117)
BUN: 8 mg/dL (ref 6–23)
CO2: 26 mEq/L (ref 19–32)
Calcium: 9.9 mg/dL (ref 8.4–10.5)
Chloride: 89 mEq/L — ABNORMAL LOW (ref 96–112)
Creatinine, Ser: 0.75 mg/dL (ref 0.40–1.20)
GFR: 83.47 mL/min (ref >60.00)
Glucose, Bld: 93 mg/dL (ref 70–99)
Potassium: 4.9 mEq/L (ref 3.5–5.1)
Sodium: 129 mEq/L — ABNORMAL LOW (ref 135–145)
Total Bilirubin: 0.8 mg/dL (ref 0.2–1.2)
Total Protein: 7.5 g/dL (ref 6.0–8.3)

## 2022-07-28 LAB — TSH: TSH: 0.76 u[IU]/mL (ref 0.35–5.50)

## 2022-07-28 LAB — VITAMIN B12: Vitamin B-12: 491 pg/mL (ref 211–911)

## 2022-07-28 LAB — FOLATE: Folate: 5.8 ng/mL — ABNORMAL LOW (ref >5.9)

## 2022-07-28 LAB — VITAMIN D 25 HYDROXY (VIT D DEFICIENCY, FRACTURES): VITD: 19.36 ng/mL — ABNORMAL LOW (ref 30.00–100.00)

## 2022-07-28 MED ORDER — DOXYCYCLINE HYCLATE 100 MG PO TABS: 100.0000 mg | ORAL_TABLET | Freq: Two times a day (BID) | ORAL | 0 refills | Status: DC

## 2022-07-28 NOTE — Addendum Note (Signed)
Addended by: Roma Schanz R on: 07/28/2022 03:50 PM   Modules accepted: Orders

## 2022-07-28 NOTE — Progress Notes (Signed)
Subjective:   By signing my name below, I, Daiva Huge, attest that this documentation has been prepared under the direction and in the presence of Ann Held, DO 07/28/22   Patient ID: [name], female    DOB: 1956/03/08, 67 y.o.   MRN: [id number]  Chief Complaint  Patient presents with   Extremity Weakness    HPI Patient is in today for an office visit.  Neuropathy in Lower Extremities She is concerned about neuropathy in her lower extremities. She believes it may have something to do with the shoes she has been using. She has not done physical therapy for mobility in years. She is interested in doing physical therapy at home. She is interested in a pediatry consult She is walking at home. She is not driving. She reports no other issues aside from neuropathy.  Toe Lesion She received antibiotics at an urgent care clinic 4 weeks ago to treat a lesion on her right great toe, which is healing.  She has cut back on alcohol consumption to 2 drinks per week. She has an upcoming MRI next week.  She is interested in receiving influenza and pneumonia vaccines. She is interested in receiving a mammogram.   Past Medical History:  Diagnosis Date   Anxiety    Cancer ([place]) 04/2014   squamous cell carcinoma L inner thigh   Depression    Hard of hearing    Hypertension    Osteopenia    Substance abuse ([place])    TIA (transient ischemic attack) 2016    Past Surgical History:  Procedure Laterality Date   AUGMENTATION MAMMAPLASTY Bilateral 1988   BREAST ENHANCEMENT SURGERY     BUNIONECTOMY     ENDOSCOPIC RETROGRADE CHOLANGIOPANCREATOGRAPHY (ERCP) WITH PROPOFOL N/A 07/28/2021   Procedure: ENDOSCOPIC RETROGRADE CHOLANGIOPANCREATOGRAPHY (ERCP) WITH PROPOFOL;  Surgeon: Milus Banister, MD;  [facility];  Service: Endoscopy;  Laterality: N/A;   GUM SURGERY  11/2017   NOSE SURGERY     Epistaxis   REMOVAL OF STONES  07/28/2021   Procedure: REMOVAL OF  STONES;  Surgeon: Milus Banister, MD;  [facility];  Service: Endoscopy;;   SPHINCTEROTOMY  07/28/2021   Procedure: Joan Mayans;  Surgeon: Milus Banister, MD;  [facility];  Service: Endoscopy;;   TONSILLECTOMY      Family History  Problem Relation Age of Onset   Hypertension Mother    Kidney disease Mother        dialysis   Alzheimer's disease Mother    Hyperlipidemia Father    Stroke Paternal Grandmother    Rheum arthritis Paternal Grandmother    Breast cancer Neg Hx    Colon cancer Neg Hx    Esophageal cancer Neg Hx    Rectal cancer Neg Hx    Stomach cancer Neg Hx     Social History   Socioeconomic History   Marital status: Divorced    Spouse name: Not on file   Number of children: 0   Years of education: 12   Highest education level: 12th grade  Occupational History   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  Tobacco Use   Smoking status: Never    Passive exposure: Never   Smokeless tobacco: Never  Vaping Use   Vaping Use: Never used  Substance and Sexual Activity   Alcohol use: Not Currently    Alcohol/week: 4.0 - 6.0 standard drinks of alcohol    Types: 4 - 6 Cans of beer per  week    Comment: 3 times a week, states hx of heavy use 15 years ago   Drug use: No   Sexual activity: Not Currently    Partners: Male  Other Topics Concern   Not on file  Social History Narrative   Exercise--- gym 6x a week   Social Determinants of Health   Financial Resource Strain: Low Risk  (05/16/2021)   Overall Financial Resource Strain (CARDIA)    Difficulty of Paying Living Expenses: Not hard at all  Food Insecurity: No Food Insecurity (05/16/2021)   Hunger Vital Sign    Worried About Running Out of Food in the Last Year: Never true    [place] in the Last Year: Never true  Transportation Needs: No Transportation Needs (05/16/2021)   PRAPARE - Hydrologist (Medical): No    Lack of Transportation (Non-Medical):  No  Physical Activity: Sufficiently Active (05/16/2021)   Exercise Vital Sign    Days of Exercise per Week: 6 days    Minutes of Exercise per Session: 50 min  Stress: Stress Concern Present (05/16/2021)   [place]    Feeling of Stress : To some extent  Social Connections: Moderately Integrated (05/16/2021)   Social Connection and Isolation Panel [NHANES]    Frequency of Communication with Friends and Family: More than three times a week    Frequency of Social Gatherings with Friends and Family: More than three times a week    Attends Religious Services: More than 4 times per year    Active Member of [employer] or Organizations: Yes    Attends Archivist Meetings: 1 to 4 times per year    Marital Status: Divorced  Intimate Partner Violence: Not At Risk (05/16/2021)   Humiliation, Afraid, Rape, and Kick questionnaire    Fear of Current or Ex-Partner: No    Emotionally Abused: No    Physically Abused: No    Sexually Abused: No    Outpatient Medications Prior to Visit  Medication Sig Dispense Refill   folic acid (FOLVITE) 1 MG tablet Take 1 tablet (1 mg total) by mouth daily.     lisinopril (ZESTRIL) 40 MG tablet TAKE 1 TABLET BY MOUTH EVERY DAY 90 tablet 0   magnesium oxide (MAG-OX) 400 (240 Mg) MG tablet Take 1 tablet (400 mg total) by mouth 2 (two) times daily. (Patient taking differently: Take 400 mg by mouth daily.)     melatonin 5 MG TABS Take 1 tablet (5 mg total) by mouth at bedtime as needed.  0   metoprolol succinate (TOPROL-XL) 100 MG 24 hr tablet Take 1 tablet (100 mg total) by mouth daily. Take with or immediately following a meal 90 tablet 1   Multiple Vitamin (MULTIVITAMIN WITH MINERALS) TABS tablet Take 1 tablet by mouth daily.     Thiamine HCl (VITAMIN B1) 100 MG TABS Take 100 mg by mouth daily.     urea (URE-NA) 15 g PACK oral packet Take 15 g by mouth daily. TAKE IT UNTIL YOU HAVE CONSISTENT SOLUTE  INTAKE then STOP it once eating food well. (Patient taking differently: Take 15 g by mouth every other day. TAKE IT UNTIL YOU HAVE CONSISTENT SOLUTE INTAKE then STOP it once eating food well.)     No facility-administered medications prior to visit.    Allergies  Allergen Reactions   Codeine Other (See Comments)    REACTION: insomnia and anxious  Review of Systems  Constitutional:  Negative for fever and malaise/fatigue.  HENT:  Negative for congestion.   Eyes:  Negative for blurred vision.  Respiratory:  Negative for shortness of breath.   Cardiovascular:  Negative for chest pain, palpitations and leg swelling.  Gastrointestinal:  Negative for abdominal pain, blood in stool and nausea.  Genitourinary:  Negative for dysuria and frequency.  Musculoskeletal:  Negative for falls.       (-) New muscle pain   Skin:  Negative for rash.  Neurological:  Positive for weakness (Bilateral legs). Negative for dizziness, loss of consciousness and headaches.  Endo/Heme/Allergies:  Negative for environmental allergies.  Psychiatric/Behavioral:  Negative for depression. The patient is not nervous/anxious.        Objective:    Physical Exam Vitals and nursing note reviewed.  Constitutional:      General: She is not in acute distress.    Appearance: Normal appearance. She is not ill-appearing.  HENT:     Head: Normocephalic and atraumatic.     Right Ear: External ear normal.     Left Ear: External ear normal.  Eyes:     Extraocular Movements: Extraocular movements intact.     Pupils: Pupils are equal, round, and reactive to light.  Cardiovascular:     Rate and Rhythm: Normal rate and regular rhythm.     Heart sounds: Normal heart sounds. No murmur heard.    No gallop.  Pulmonary:     Effort: Pulmonary effort is normal. No respiratory distress.     Breath sounds: Normal breath sounds. No wheezing or rales.  Skin:    General: Skin is warm and dry.  Neurological:     Mental Status:  She is alert and oriented to person, place, and time.     Motor: Weakness present.     Gait: Gait abnormal.     Comments: Tremor b/l hands   Psychiatric:        Judgment: Judgment normal.     BP (!) 142/98 (BP Location: Left Arm, Patient Position: Sitting, Cuff Size: Normal)   Pulse 72   Temp 98.3 F (36.8 C) (Oral)   Resp 18   Ht 4' 11.25" (1.505 m)   SpO2 100%   BMI 21.33 kg/m  Wt Readings from Last 3 Encounters:  03/30/22 106 lb 8 oz (48.3 kg)  09/20/21 107 lb (48.5 kg)  07/28/21 105 lb (47.6 kg)       Assessment & Plan:  Weakness -     Ambulatory referral to [place] -     CBC with Differential/Platelet -     Comprehensive metabolic panel -     Lipid panel -     TSH -     Vitamin B12 -     VITAMIN D 25 Hydroxy (Vit-D Deficiency, Fractures) -     Folate  Open wound of right great toe, initial encounter -     Doxycycline Hyclate; Take 1 tablet (100 mg total) by mouth 2 (two) times daily.  Dispense: 20 tablet; Refill: 0 -     VAS [place] LOWER EXT ART SEG MULTI (SEGMENTALS & LE RAYNAUDS); Future -     Ambulatory referral to Podiatry  Alcoholic cirrhosis, unspecified whether ascites present [facility]) -     Ambulatory referral to [place] -     CBC with Differential/Platelet -     Comprehensive metabolic panel -     Lipid panel -     TSH -  Vitamin B12 -     VITAMIN D 25 Hydroxy (Vit-D Deficiency, Fractures) -     Folate  TIA (transient ischemic attack) -     Ambulatory referral to [place] frequently -     Ambulatory referral to Home Health  Gait disturbance -     Ambulatory referral to [place] -     CBC with Differential/Platelet -     Comprehensive metabolic panel -     Lipid panel -     TSH -     Vitamin B12 -     VITAMIN D 25 Hydroxy (Vit-D Deficiency, Fractures) -     Folate  Need for influenza vaccination -     Flu Vaccine QUAD High Dose(Fluad)  Need for pneumococcal 20-valent conjugate vaccination -     Pneumococcal conjugate  vaccine 20-valent  Screening mammogram for breast cancer -     3D Screening Mammogram, Left and Right; Future  Estrogen deficiency -     DG Bone Density; Future     I,Alexander Ruley,acting as a scribe for [employer], DO.,have documented all relevant documentation on the behalf of Ann Held, DO,as directed by  Ann Held, DO while in the presence of [place], DO, personally preformed the services described in this documentation.  All medical record entries made by the scribe were at my direction and in my presence.  I have reviewed the chart and discharge instructions (if applicable) and agree that the record reflects my personal performance and is accurate and complete. 07/28/22   Ann Held, DO

## 2022-07-28 NOTE — Patient Instructions (Addendum)
0+.Deconditioning Deconditioning refers to the changes in the body that occur during a period of time when you are not active (inactivity). The changes happen in the heart, lungs, and muscles. They make you feel tired and weak (fatigued) and decrease your ability to be active. The three stages of deconditioning include: Mild deconditioning. This is a change in your ability to do your usual exercise activities, such as running, biking, or swimming. Moderate deconditioning. This is a change in your ability to do normal everyday activities, such as walking, shopping for groceries, and doing chores. Severe deconditioning. In this stage, you may not be able to do minimal activity or usual self-care. What are the causes? Deconditioning can occur after only a few days of inactivity. The longer the period of inactivity, the more severe the deconditioning will be, and the longer it will take to return to your previous level of functioning. It can take three times longer to recover than the time period of inactivity. Deconditioning is caused by inactivity, often due to: Illnesses, such as cancer, stroke, heart attack, fibromyalgia, and chronic fatigue syndrome. Injuries, especially back injuries, broken bones, and injuries to soft tissues, such as ligaments and tendons. Hospitalization, even for just a day or two. Pregnancy, especially if long periods of bed rest are needed. What increases the risk? The following factors may make you more likely to develop this condition: Having obesity or poor nutrition. Having poor mobility before the period of inactivity. Being an older adult. Having depression. Having problems with thinking and learning (cognitive impairment). What are the signs or symptoms? Symptoms of this condition include: Weakness and tiredness. Shortness of breath with minor physical effort (exertion). A heartbeat that is faster than normal. You may not notice this without taking your  pulse. Pain or discomfort with activity. Decreased strength, endurance, and balance. Difficulty doing your usual forms of exercise. Difficulty doing activities of daily living, such as grocery shopping or chores. You may also have problems walking around the house and doing basic self-care, such as getting to the bathroom, preparing meals, or doing laundry. How is this diagnosed? This condition is diagnosed based on your medical history and a physical exam. During the physical exam, your health care provider will check for signs of deconditioning, such as: Decreased size of muscles. Decreased strength. Trouble with balance. Shortness of breath or a heart rate that is faster than normal after minor exertion. How is this treated? Treatment for this condition involves an exercise program in which activity is increased slowly. Your health care provider will tell you which exercises are right for you. The exercise program will likely include: Aerobic exercise. This type of exercise helps improve the functioning of the heart, lungs, and muscles. Strength training. This type of exercise helps increase muscle size and strength. Both of these types of exercise will improve your endurance. You may be referred to a physical therapist who can create a safe strengthening program for you to follow. Follow these instructions at home: Eating and drinking  Eat a healthy, well-balanced diet. This includes: Proteins, such as lean meats and fish, to build muscles. Fresh fruits and vegetables. Carbohydrates, such as whole grains, to boost energy. Drink enough fluid to keep your urine pale yellow. Activity  Follow the exercise program that is recommended by your health care provider or physical therapist. Do not increase your exercise any faster than directed. General instructions Take over-the-counter and prescription medicines only as told by your health care provider. Do not use any  products that contain  nicotine or tobacco. These products include cigarettes, chewing tobacco, and vaping devices, such as e-cigarettes. If you need help quitting, ask your health care provider. Keep all follow-up visits. This is important. Contact a health care provider if: You are not able to do the recommended exercise program. You are becoming more and more tired and weak. You become light-headed when rising to a sitting or standing position. Your level of endurance decreases after it has improved. Get help right away if: You have chest pain. You are very short of breath. You have any episodes of fainting. These symptoms may be an emergency. Get help right away. Call 911. Do not wait to see if the symptoms will go away. Do not drive yourself to the hospital. Summary Deconditioning refers to the changes in the body that occur during a period of inactivity. Deconditioning happens in the heart, lungs, and muscles. The changes make you feel tired and weak and decrease your ability to be active. Treatment for deconditioning involves an exercise program in which activity is increased slowly. This information is not intended to replace advice given to you by your health care provider. Make sure you discuss any questions you have with your health care provider. Document Revised: 04/18/2021 Document Reviewed: 04/18/2021 Elsevier Patient Education  [place].

## 2022-08-01 ENCOUNTER — Other Ambulatory Visit: Payer: Self-pay | Admitting: Nurse Practitioner

## 2022-08-01 ENCOUNTER — Ambulatory Visit (HOSPITAL_COMMUNITY)
Admission: RE | Admit: 2022-08-01 | Discharge: 2022-08-01 | Disposition: A | Discharge status: Home / Self Care | Payer: Medicare Other | Source: Ambulatory Visit | Attending: Nurse Practitioner | Admitting: Nurse Practitioner

## 2022-08-01 DIAGNOSIS — K862 Cyst of pancreas: Secondary | ICD-10-CM

## 2022-08-01 DIAGNOSIS — N323 Diverticulum of bladder: Secondary | ICD-10-CM | POA: Diagnosis not present

## 2022-08-01 DIAGNOSIS — K863 Pseudocyst of pancreas: Secondary | ICD-10-CM | POA: Diagnosis not present

## 2022-08-01 DIAGNOSIS — K838 Other specified diseases of biliary tract: Secondary | ICD-10-CM | POA: Diagnosis not present

## 2022-08-01 DIAGNOSIS — N281 Cyst of kidney, acquired: Secondary | ICD-10-CM | POA: Diagnosis not present

## 2022-08-01 MED ORDER — GADOBUTROL 1 MMOL/ML IV SOLN
5.0000 mL | Freq: Once | INTRAVENOUS | Status: AC | PRN
  Administered 2022-08-01: 5 mL via INTRAVENOUS

## 2022-08-08 ENCOUNTER — Encounter: Payer: Self-pay | Admitting: Family Medicine

## 2022-08-08 ENCOUNTER — Ambulatory Visit: Payer: Medicare Other | Admitting: Podiatry

## 2022-08-10 NOTE — Telephone Encounter (Signed)
MyChart message sent to patient with lab reminder.  

## 2022-08-11 ENCOUNTER — Ambulatory Visit (HOSPITAL_COMMUNITY)
Admission: RE | Admit: 2022-08-11 | Payer: Medicare Other | Source: Ambulatory Visit | Attending: Family Medicine | Admitting: Family Medicine

## 2022-08-11 NOTE — Telephone Encounter (Signed)
Mychart message seen by pt.

## 2022-08-18 ENCOUNTER — Ambulatory Visit: Payer: Medicare Other | Admitting: Podiatry

## 2022-08-21 ENCOUNTER — Telehealth: Payer: Self-pay

## 2022-08-21 NOTE — Telephone Encounter (Signed)
Patient returned call and a follow up appointment was scheduled for 10-03-22 at 10:30am.

## 2022-08-21 NOTE — Telephone Encounter (Signed)
Left message for patient to return call to schedule 6 month follow up with Nevin Bloodgood, dx cirrhosis.  Patient will also need 2nd Hep A on or after 10-02-22. Will attempt to schedule follow up and injection on the same day.  Will continue efforts.

## 2022-08-23 ENCOUNTER — Telehealth: Payer: Self-pay | Admitting: Family Medicine

## 2022-08-23 NOTE — Telephone Encounter (Signed)
Debbie with [facility] calling in to provide call report. Patient is not appropriate for home health services, she cannot meet her basic needs and has no caregiver. Patient cannot ambulate to the bathroom. She can't get to food. Patient has trouble getting to her bed. Debbie helped pt to the bathroom by assisting her with the rollator and pushing her. Pt unable to sit on toilet correctly without a lot of assistance. Pt was unable to open a can of seltzer water without help of Debbie.She said pt has to 2 open wounds on her foot (one on each foot) that need attention today. She said pt is wearing tennis shoes with no protection on her wounds. Debbie said pt should not be home alone because she cannot care for herself. Debbie spoke with her nursing supervisor and it was decided she needed to go to the ED but pt refused. Debbie noticed a bottle of metoprolol filled on 06/26/22, mostly full so she is under the impression the pt is not taking medication. She isn't sure pt could open bottle since she could open can of water. She did not answer their calls and didn't know how to go into phone settings to change volume.

## 2022-08-24 ENCOUNTER — Emergency Department (HOSPITAL_COMMUNITY): Payer: Medicare Other

## 2022-08-24 ENCOUNTER — Other Ambulatory Visit: Payer: Self-pay

## 2022-08-24 ENCOUNTER — Inpatient Hospital Stay (HOSPITAL_COMMUNITY): Payer: Medicare Other

## 2022-08-24 ENCOUNTER — Inpatient Hospital Stay (HOSPITAL_COMMUNITY)
Admission: EM | Admit: 2022-08-24 | Discharge: 2022-09-08 | DRG: 070 | Disposition: E | Payer: Medicare Other | Attending: Pulmonary Disease | Admitting: Pulmonary Disease

## 2022-08-24 ENCOUNTER — Encounter (HOSPITAL_COMMUNITY): Payer: Self-pay

## 2022-08-24 DIAGNOSIS — K7030 Alcoholic cirrhosis of liver without ascites: Secondary | ICD-10-CM | POA: Diagnosis present

## 2022-08-24 DIAGNOSIS — K2211 Ulcer of esophagus with bleeding: Secondary | ICD-10-CM | POA: Diagnosis not present

## 2022-08-24 DIAGNOSIS — R7989 Other specified abnormal findings of blood chemistry: Secondary | ICD-10-CM | POA: Diagnosis present

## 2022-08-24 DIAGNOSIS — T68XXXA Hypothermia, initial encounter: Secondary | ICD-10-CM | POA: Diagnosis present

## 2022-08-24 DIAGNOSIS — Z66 Do not resuscitate: Secondary | ICD-10-CM | POA: Diagnosis not present

## 2022-08-24 DIAGNOSIS — I6389 Other cerebral infarction: Secondary | ICD-10-CM | POA: Diagnosis not present

## 2022-08-24 DIAGNOSIS — Z79899 Other long term (current) drug therapy: Secondary | ICD-10-CM

## 2022-08-24 DIAGNOSIS — K7040 Alcoholic hepatic failure without coma: Secondary | ICD-10-CM | POA: Diagnosis present

## 2022-08-24 DIAGNOSIS — F10139 Alcohol abuse with withdrawal, unspecified: Secondary | ICD-10-CM | POA: Diagnosis not present

## 2022-08-24 DIAGNOSIS — F1010 Alcohol abuse, uncomplicated: Secondary | ICD-10-CM | POA: Diagnosis present

## 2022-08-24 DIAGNOSIS — E860 Dehydration: Secondary | ICD-10-CM | POA: Diagnosis present

## 2022-08-24 DIAGNOSIS — J9601 Acute respiratory failure with hypoxia: Secondary | ICD-10-CM | POA: Diagnosis not present

## 2022-08-24 DIAGNOSIS — M6282 Rhabdomyolysis: Secondary | ICD-10-CM | POA: Diagnosis present

## 2022-08-24 DIAGNOSIS — S199XXA Unspecified injury of neck, initial encounter: Secondary | ICD-10-CM | POA: Diagnosis not present

## 2022-08-24 DIAGNOSIS — E871 Hypo-osmolality and hyponatremia: Secondary | ICD-10-CM | POA: Diagnosis present

## 2022-08-24 DIAGNOSIS — J90 Pleural effusion, not elsewhere classified: Secondary | ICD-10-CM | POA: Diagnosis not present

## 2022-08-24 DIAGNOSIS — R54 Age-related physical debility: Secondary | ICD-10-CM | POA: Diagnosis present

## 2022-08-24 DIAGNOSIS — X31XXXA Exposure to excessive natural cold, initial encounter: Secondary | ICD-10-CM

## 2022-08-24 DIAGNOSIS — K2289 Other specified disease of esophagus: Secondary | ICD-10-CM | POA: Diagnosis not present

## 2022-08-24 DIAGNOSIS — M19011 Primary osteoarthritis, right shoulder: Secondary | ICD-10-CM | POA: Diagnosis not present

## 2022-08-24 DIAGNOSIS — K5730 Diverticulosis of large intestine without perforation or abscess without bleeding: Secondary | ICD-10-CM | POA: Diagnosis present

## 2022-08-24 DIAGNOSIS — Z820 Family history of epilepsy and other diseases of the nervous system: Secondary | ICD-10-CM

## 2022-08-24 DIAGNOSIS — Z515 Encounter for palliative care: Secondary | ICD-10-CM | POA: Diagnosis not present

## 2022-08-24 DIAGNOSIS — R7401 Elevation of levels of liver transaminase levels: Secondary | ICD-10-CM | POA: Diagnosis not present

## 2022-08-24 DIAGNOSIS — G9341 Metabolic encephalopathy: Principal | ICD-10-CM | POA: Diagnosis present

## 2022-08-24 DIAGNOSIS — I6381 Other cerebral infarction due to occlusion or stenosis of small artery: Secondary | ICD-10-CM | POA: Diagnosis not present

## 2022-08-24 DIAGNOSIS — K862 Cyst of pancreas: Secondary | ICD-10-CM | POA: Diagnosis present

## 2022-08-24 DIAGNOSIS — Z823 Family history of stroke: Secondary | ICD-10-CM

## 2022-08-24 DIAGNOSIS — I959 Hypotension, unspecified: Secondary | ICD-10-CM | POA: Diagnosis present

## 2022-08-24 DIAGNOSIS — I6789 Other cerebrovascular disease: Secondary | ICD-10-CM | POA: Diagnosis present

## 2022-08-24 DIAGNOSIS — E861 Hypovolemia: Secondary | ICD-10-CM | POA: Diagnosis present

## 2022-08-24 DIAGNOSIS — U071 COVID-19: Secondary | ICD-10-CM | POA: Diagnosis present

## 2022-08-24 DIAGNOSIS — W19XXXA Unspecified fall, initial encounter: Secondary | ICD-10-CM | POA: Diagnosis present

## 2022-08-24 DIAGNOSIS — M549 Dorsalgia, unspecified: Secondary | ICD-10-CM | POA: Diagnosis present

## 2022-08-24 DIAGNOSIS — R0602 Shortness of breath: Secondary | ICD-10-CM | POA: Diagnosis not present

## 2022-08-24 DIAGNOSIS — N179 Acute kidney failure, unspecified: Secondary | ICD-10-CM | POA: Diagnosis present

## 2022-08-24 DIAGNOSIS — I639 Cerebral infarction, unspecified: Secondary | ICD-10-CM | POA: Diagnosis not present

## 2022-08-24 DIAGNOSIS — D62 Acute posthemorrhagic anemia: Secondary | ICD-10-CM | POA: Diagnosis not present

## 2022-08-24 DIAGNOSIS — K920 Hematemesis: Secondary | ICD-10-CM

## 2022-08-24 DIAGNOSIS — F419 Anxiety disorder, unspecified: Secondary | ICD-10-CM | POA: Diagnosis present

## 2022-08-24 DIAGNOSIS — K838 Other specified diseases of biliary tract: Secondary | ICD-10-CM | POA: Diagnosis present

## 2022-08-24 DIAGNOSIS — I10 Essential (primary) hypertension: Secondary | ICD-10-CM | POA: Diagnosis not present

## 2022-08-24 DIAGNOSIS — Z8673 Personal history of transient ischemic attack (TIA), and cerebral infarction without residual deficits: Secondary | ICD-10-CM

## 2022-08-24 DIAGNOSIS — R945 Abnormal results of liver function studies: Secondary | ICD-10-CM | POA: Diagnosis not present

## 2022-08-24 DIAGNOSIS — G9389 Other specified disorders of brain: Secondary | ICD-10-CM | POA: Diagnosis present

## 2022-08-24 DIAGNOSIS — K2210 Ulcer of esophagus without bleeding: Secondary | ICD-10-CM | POA: Diagnosis not present

## 2022-08-24 DIAGNOSIS — F32A Depression, unspecified: Secondary | ICD-10-CM | POA: Diagnosis present

## 2022-08-24 DIAGNOSIS — R270 Ataxia, unspecified: Secondary | ICD-10-CM | POA: Diagnosis not present

## 2022-08-24 DIAGNOSIS — M8580 Other specified disorders of bone density and structure, unspecified site: Secondary | ICD-10-CM | POA: Diagnosis present

## 2022-08-24 DIAGNOSIS — Z043 Encounter for examination and observation following other accident: Secondary | ICD-10-CM | POA: Diagnosis not present

## 2022-08-24 DIAGNOSIS — Z841 Family history of disorders of kidney and ureter: Secondary | ICD-10-CM

## 2022-08-24 DIAGNOSIS — J9602 Acute respiratory failure with hypercapnia: Secondary | ICD-10-CM | POA: Diagnosis not present

## 2022-08-24 DIAGNOSIS — R1110 Vomiting, unspecified: Secondary | ICD-10-CM | POA: Diagnosis not present

## 2022-08-24 DIAGNOSIS — E872 Acidosis, unspecified: Secondary | ICD-10-CM | POA: Diagnosis present

## 2022-08-24 DIAGNOSIS — K922 Gastrointestinal hemorrhage, unspecified: Secondary | ICD-10-CM | POA: Diagnosis not present

## 2022-08-24 DIAGNOSIS — Z8249 Family history of ischemic heart disease and other diseases of the circulatory system: Secondary | ICD-10-CM

## 2022-08-24 DIAGNOSIS — Z885 Allergy status to narcotic agent status: Secondary | ICD-10-CM

## 2022-08-24 DIAGNOSIS — Z83438 Family history of other disorder of lipoprotein metabolism and other lipidemia: Secondary | ICD-10-CM

## 2022-08-24 DIAGNOSIS — R578 Other shock: Secondary | ICD-10-CM | POA: Diagnosis present

## 2022-08-24 DIAGNOSIS — G319 Degenerative disease of nervous system, unspecified: Secondary | ICD-10-CM | POA: Diagnosis not present

## 2022-08-24 DIAGNOSIS — R188 Other ascites: Secondary | ICD-10-CM | POA: Diagnosis not present

## 2022-08-24 DIAGNOSIS — R571 Hypovolemic shock: Secondary | ICD-10-CM | POA: Diagnosis not present

## 2022-08-24 DIAGNOSIS — R4182 Altered mental status, unspecified: Secondary | ICD-10-CM | POA: Diagnosis not present

## 2022-08-24 LAB — COMPREHENSIVE METABOLIC PANEL
ALT: 142 U/L — ABNORMAL HIGH (ref 0–44)
ALT: 116 U/L — ABNORMAL HIGH (ref 0–44)
AST: 363 U/L — ABNORMAL HIGH (ref 15–41)
AST: 370 U/L — ABNORMAL HIGH (ref 15–41)
Albumin: 3.8 g/dL (ref 3.5–5.0)
Albumin: 3.1 g/dL — ABNORMAL LOW (ref 3.5–5.0)
Alkaline Phosphatase: 62 U/L (ref 38–126)
Alkaline Phosphatase: 52 U/L (ref 38–126)
Anion gap: 22 — ABNORMAL HIGH (ref 5–15)
Anion gap: 18 — ABNORMAL HIGH (ref 5–15)
BUN: 52 mg/dL — ABNORMAL HIGH (ref 8–23)
BUN: 46 mg/dL — ABNORMAL HIGH (ref 8–23)
CO2: 17 mmol/L — ABNORMAL LOW (ref 22–32)
CO2: 14 mmol/L — ABNORMAL LOW (ref 22–32)
Calcium: 8.8 mg/dL — ABNORMAL LOW (ref 8.9–10.3)
Calcium: 7.8 mg/dL — ABNORMAL LOW (ref 8.9–10.3)
Chloride: 77 mmol/L — ABNORMAL LOW (ref 98–111)
Chloride: 87 mmol/L — ABNORMAL LOW (ref 98–111)
Creatinine, Ser: 1.32 mg/dL — ABNORMAL HIGH (ref 0.44–1.00)
Creatinine, Ser: 1.1 mg/dL — ABNORMAL HIGH (ref 0.44–1.00)
GFR, Estimated: 45 mL/min — ABNORMAL LOW (ref >60)
GFR, Estimated: 56 mL/min — ABNORMAL LOW (ref >60)
Glucose, Bld: 106 mg/dL — ABNORMAL HIGH (ref 70–99)
Glucose, Bld: 90 mg/dL (ref 70–99)
Potassium: 4.9 mmol/L (ref 3.5–5.1)
Potassium: 3.8 mmol/L (ref 3.5–5.1)
Sodium: 116 mmol/L — CL (ref 135–145)
Sodium: 119 mmol/L — CL (ref 135–145)
Total Bilirubin: 2 mg/dL — ABNORMAL HIGH (ref 0.3–1.2)
Total Bilirubin: 1.5 mg/dL — ABNORMAL HIGH (ref 0.3–1.2)
Total Protein: 6.1 g/dL — ABNORMAL LOW (ref 6.5–8.1)
Total Protein: 5 g/dL — ABNORMAL LOW (ref 6.5–8.1)

## 2022-08-24 LAB — LACTIC ACID, PLASMA
Lactic Acid, Venous: 4.1 mmol/L (ref 0.5–1.9)
Lactic Acid, Venous: 7.2 mmol/L (ref 0.5–1.9)
Lactic Acid, Venous: 2.5 mmol/L (ref 0.5–1.9)
Lactic Acid, Venous: 1.9 mmol/L (ref 0.5–1.9)

## 2022-08-24 LAB — I-STAT VENOUS BLOOD GAS, ED
Acid-base deficit: 12 mmol/L — ABNORMAL HIGH (ref 0.0–2.0)
Bicarbonate: 11.9 mmol/L — ABNORMAL LOW (ref 20.0–28.0)
Calcium, Ion: 0.9 mmol/L — ABNORMAL LOW (ref 1.15–1.40)
HCT: 24 % — ABNORMAL LOW (ref 36.0–46.0)
Hemoglobin: 8.2 g/dL — ABNORMAL LOW (ref 12.0–15.0)
O2 Saturation: 71 %
Potassium: 3.2 mmol/L — ABNORMAL LOW (ref 3.5–5.1)
Sodium: 123 mmol/L — ABNORMAL LOW (ref 135–145)
TCO2: 13 mmol/L — ABNORMAL LOW (ref 22–32)
pCO2, Ven: 21.7 mmHg — ABNORMAL LOW (ref 44–60)
pH, Ven: 7.346 (ref 7.25–7.43)
pO2, Ven: 38 mmHg (ref 32–45)

## 2022-08-24 LAB — CBC WITH DIFFERENTIAL/PLATELET
Abs Immature Granulocytes: 0.19 10*3/uL — ABNORMAL HIGH (ref 0.00–0.07)
Basophils Absolute: 0 10*3/uL (ref 0.0–0.1)
Basophils Relative: 0 %
Eosinophils Absolute: 0 10*3/uL (ref 0.0–0.5)
Eosinophils Relative: 0 %
HCT: 29.2 % — ABNORMAL LOW (ref 36.0–46.0)
Hemoglobin: 10.9 g/dL — ABNORMAL LOW (ref 12.0–15.0)
Immature Granulocytes: 2 %
Lymphocytes Relative: 8 %
Lymphs Abs: 1.1 10*3/uL (ref 0.7–4.0)
MCH: 38.2 pg — ABNORMAL HIGH (ref 26.0–34.0)
MCHC: 37.3 g/dL — ABNORMAL HIGH (ref 30.0–36.0)
MCV: 102.5 fL — ABNORMAL HIGH (ref 80.0–100.0)
Monocytes Absolute: 1.8 10*3/uL — ABNORMAL HIGH (ref 0.1–1.0)
Monocytes Relative: 14 %
Neutro Abs: 10 10*3/uL — ABNORMAL HIGH (ref 1.7–7.7)
Neutrophils Relative %: 76 %
Platelets: 247 10*3/uL (ref 150–400)
RBC: 2.85 MIL/uL — ABNORMAL LOW (ref 3.87–5.11)
RDW: 11.2 % — ABNORMAL LOW (ref 11.5–15.5)
WBC: 13.1 10*3/uL — ABNORMAL HIGH (ref 4.0–10.5)
nRBC: 1.4 % — ABNORMAL HIGH (ref 0.0–0.2)

## 2022-08-24 LAB — URINALYSIS, W/ REFLEX TO CULTURE (INFECTION SUSPECTED)
Bacteria, UA: NONE SEEN
Bilirubin Urine: NEGATIVE
Glucose, UA: NEGATIVE mg/dL
Ketones, ur: 5 mg/dL — AB
Leukocytes,Ua: NEGATIVE
Nitrite: NEGATIVE
Protein, ur: NEGATIVE mg/dL
Specific Gravity, Urine: 1.011 (ref 1.005–1.030)
pH: 5 (ref 5.0–8.0)

## 2022-08-24 LAB — AMMONIA: Ammonia: 15 umol/L (ref 9–35)

## 2022-08-24 LAB — CK: Total CK: 7591 U/L — ABNORMAL HIGH (ref 38–234)

## 2022-08-24 LAB — PROTIME-INR
INR: 1.4 — ABNORMAL HIGH (ref 0.8–1.2)
Prothrombin Time: 16.6 seconds — ABNORMAL HIGH (ref 11.4–15.2)

## 2022-08-24 LAB — LIPASE, BLOOD: Lipase: 57 U/L — ABNORMAL HIGH (ref 11–51)

## 2022-08-24 LAB — ETHANOL: Alcohol, Ethyl (B): <10 mg/dL (ref <10)

## 2022-08-24 MED ORDER — SODIUM CHLORIDE 0.9 % IV SOLN: INTRAVENOUS | Status: AC

## 2022-08-24 MED ORDER — ADULT MULTIVITAMIN W/MINERALS CH
1.0000 | ORAL_TABLET | Freq: Every day | ORAL | Status: DC
  Administered 2022-08-25 – 2022-08-26 (×2): 1 via ORAL
  Filled 2022-08-24 (×2): qty 1

## 2022-08-24 MED ORDER — THIAMINE HCL 100 MG/ML IJ SOLN: 100.0000 mg | Freq: Every day | INTRAMUSCULAR | Status: DC

## 2022-08-24 MED ORDER — SODIUM CHLORIDE 0.9 % IV BOLUS
1000.0000 mL | Freq: Once | INTRAVENOUS | Status: AC
  Administered 2022-08-24: 1000 mL via INTRAVENOUS

## 2022-08-24 MED ORDER — SODIUM CHLORIDE 0.9% FLUSH
3.0000 mL | Freq: Two times a day (BID) | INTRAVENOUS | Status: DC
  Administered 2022-08-24 – 2022-08-27 (×6): 3 mL via INTRAVENOUS

## 2022-08-24 MED ORDER — THIAMINE MONONITRATE 100 MG PO TABS
100.0000 mg | ORAL_TABLET | Freq: Every day | ORAL | Status: DC
  Administered 2022-08-25 – 2022-08-26 (×2): 100 mg via ORAL
  Filled 2022-08-24 (×2): qty 1

## 2022-08-24 MED ORDER — FOLIC ACID 1 MG PO TABS
1.0000 mg | ORAL_TABLET | Freq: Every day | ORAL | Status: DC
  Administered 2022-08-25 – 2022-08-26 (×2): 1 mg via ORAL
  Filled 2022-08-24 (×2): qty 1

## 2022-08-24 MED ORDER — HEPARIN SODIUM (PORCINE) 5000 UNIT/ML IJ SOLN
5000.0000 [IU] | Freq: Three times a day (TID) | INTRAMUSCULAR | Status: DC
  Administered 2022-08-24 – 2022-08-26 (×6): 5000 [IU] via SUBCUTANEOUS
  Filled 2022-08-24 (×6): qty 1

## 2022-08-24 MED ORDER — ONDANSETRON HCL 4 MG/2ML IJ SOLN: 4.0000 mg | Freq: Four times a day (QID) | INTRAMUSCULAR | Status: DC | PRN

## 2022-08-24 MED ORDER — SODIUM CHLORIDE 0.9 % IV SOLN: Freq: Once | INTRAVENOUS | Status: AC

## 2022-08-24 MED ORDER — ONDANSETRON HCL 4 MG PO TABS: 4.0000 mg | ORAL_TABLET | Freq: Four times a day (QID) | ORAL | Status: DC | PRN

## 2022-08-24 MED ORDER — LACTATED RINGERS IV BOLUS
1000.0000 mL | Freq: Once | INTRAVENOUS | Status: AC
  Administered 2022-08-24: 1000 mL via INTRAVENOUS

## 2022-08-24 MED ORDER — SENNOSIDES-DOCUSATE SODIUM 8.6-50 MG PO TABS: 1.0000 | ORAL_TABLET | Freq: Every evening | ORAL | Status: DC | PRN

## 2022-08-24 MED ORDER — LORAZEPAM 1 MG PO TABS
1.0000 mg | ORAL_TABLET | ORAL | Status: DC | PRN
  Administered 2022-08-25: 1 mg via ORAL
  Filled 2022-08-24: qty 1

## 2022-08-24 NOTE — ED Provider Notes (Signed)
[facility] Provider Note   CSN: [id number] Arrival date & time: 08/29/2022  1410     History  Chief Complaint  Patient presents with   Cold Exposure    [name] is a 67 y.o. female.  HPI Patient presents with mental status change.  Reportedly found on the floor.  Cold.  Mottled extremities.  Patient really cannot provide much history.  States her back does hurt.  Found after police had been called for a welfare check.   Past Medical History:  Diagnosis Date   Anxiety    Cancer ([place]) 04/2014   squamous cell carcinoma L inner thigh   Depression    Hard of hearing    Hypertension    Osteopenia    Substance abuse ([place])    TIA (transient ischemic attack) 2016    Home Medications Prior to Admission medications   Medication Sig Start Date End Date Taking? Authorizing Provider  doxycycline (VIBRA-TABS) 100 MG tablet Take 1 tablet (100 mg total) by mouth 2 (two) times daily. 07/28/22   Ann Held, DO  folic acid (FOLVITE) 1 MG tablet Take 1 tablet (1 mg total) by mouth daily. 04/08/21   Antonieta Pert, MD  lisinopril (ZESTRIL) 40 MG tablet TAKE 1 TABLET BY MOUTH EVERY DAY 07/28/22   Carollee Herter, Alferd Apa, DO  magnesium oxide (MAG-OX) 400 (240 Mg) MG tablet Take 1 tablet (400 mg total) by mouth 2 (two) times daily. Patient taking differently: Take 400 mg by mouth daily. 04/08/21   Antonieta Pert, MD  melatonin 5 MG TABS Take 1 tablet (5 mg total) by mouth at bedtime as needed. 04/08/21   Antonieta Pert, MD  metoprolol succinate (TOPROL-XL) 100 MG 24 hr tablet Take 1 tablet (100 mg total) by mouth daily. Take with or immediately following a meal 06/26/22   Carollee Herter, Alferd Apa, DO  Multiple Vitamin (MULTIVITAMIN WITH MINERALS) TABS tablet Take 1 tablet by mouth daily. 04/08/21   Antonieta Pert, MD  Thiamine HCl (VITAMIN B1) 100 MG TABS Take 100 mg by mouth daily.    [provider]  urea (URE-NA) 15 g PACK oral packet Take 15 g by  mouth daily. TAKE IT UNTIL YOU HAVE CONSISTENT SOLUTE INTAKE then STOP it once eating food well. Patient taking differently: Take 15 g by mouth every other day. TAKE IT UNTIL YOU HAVE CONSISTENT SOLUTE INTAKE then STOP it once eating food well. 04/08/21   Antonieta Pert, MD      Allergies    Codeine    Review of Systems   Review of Systems  Physical Exam Updated Vital Signs BP (!) 85/66 (BP Location: Right Arm)   Pulse 84   Temp 99 F (37.2 C) (Rectal)   Resp (!) 21   Ht 5' (1.524 m)   Wt 52.2 kg   BMI 22.46 kg/m  Physical Exam Vitals reviewed.  HENT:     Head: Atraumatic.  Cardiovascular:     Rate and Rhythm: Regular rhythm.  Pulmonary:     Breath sounds: No wheezing.  Abdominal:     Tenderness: There is no abdominal tenderness.  Musculoskeletal:     Comments: Pressure wound on left first MTP joint and laceration on right great toe.  Mild lumbar tenderness.  Extremities otherwise nontender.  Neurological:     Mental Status: She is alert.     Comments:  awake but confused.  Cannot really provide much history.  ED Results / Procedures / Treatments   Labs (all labs ordered are listed, but only abnormal results are displayed) Labs Reviewed  PROTIME-INR - Abnormal; Notable for the following components:      Result Value   Prothrombin Time 16.6 (*)    INR 1.4 (*)    All other components within normal limits  CULTURE, BLOOD (ROUTINE X 2)  CULTURE, BLOOD (ROUTINE X 2)  COMPREHENSIVE METABOLIC PANEL  LACTIC ACID, PLASMA  LACTIC ACID, PLASMA  LIPASE, BLOOD  ETHANOL  CBC WITH DIFFERENTIAL/PLATELET  CK  AMMONIA  URINALYSIS, W/ REFLEX TO CULTURE (INFECTION SUSPECTED)  RAPID URINE DRUG SCREEN, HOSP PERFORMED    EKG None  Radiology DG Pelvis Portable  Result Date: 08/25/2022 CLINICAL DATA:  Altered mental status, fall EXAM: PORTABLE PELVIS 1-2 VIEWS COMPARISON:  None Available. FINDINGS: There is no evidence of displaced pelvic fracture or diastasis. No pelvic bone  lesions are seen. IMPRESSION: Displaced fracture or dislocation of the pelvis or bilateral proximal femurs seen in single frontal view. Electronically Signed   By: Delanna Ahmadi M.D.   On: 08/17/2022 15:35   DG Chest Portable 1 View  Result Date: 08/14/2022 CLINICAL DATA:  Altered mental status.  Fall. EXAM: PORTABLE CHEST 1 VIEW COMPARISON:  Chest radiograph dated June 13, 2015 FINDINGS: The heart size and mediastinal contours are within normal limits. Both lungs are clear. The visualized skeletal structures are unremarkable. Soft tissue densities projecting over the bilateral hemithorax suggesting breast implants IMPRESSION: No active disease. Electronically Signed   By: Keane Police D.O.   On: 08/31/2022 15:30    Procedures Procedures    Medications Ordered in ED Medications  sodium chloride 0.9 % bolus 1,000 mL (1,000 mLs Intravenous New Bag/Given 08/21/2022 1505)  lactated ringers bolus 1,000 mL (1,000 mLs Intravenous New Bag/Given 08/22/2022 1546)    ED Course/ Medical Decision Making/ A&P                             Medical Decision Making Amount and/or Complexity of Data Reviewed Labs: ordered. Radiology: ordered.   Patient brought in after welfare check.  Found on floor.  Reportedly has been laying on the ground.  Really cannot provide much history.  Differential diagnosis includes traumatic injury but also pressure injury, dehydration, liver issues.  Will get basic blood work.  Will get chest and pelvic x-ray.  Will get total CK.  Chest x-ray and pelvic x-rays independently interpreted reassuring.  Has had some hypotension.  Fluid boluses given.  Care turned over [place]        Final Clinical Impression(s) / ED Diagnoses Final diagnoses:  None    Rx / DC Orders ED Discharge Orders     None         Davonna Belling, MD 09/04/2022 1623

## 2022-08-24 NOTE — Hospital Course (Addendum)
[name] is a 67 y.o. female with medical history significant for alcohol associated liver cirrhosis, pancreatic cysts, HTN who is admitted with severe hyponatremia and rhabdomyolysis after she was found down at home.  Na was improved from 121 to 125 on the morning of 08/25/2022. BMP is pending at 1730.

## 2022-08-24 NOTE — Telephone Encounter (Signed)
Spoke with Shyrl Numbers (on DPR). Made here aware of the situation and all details below. She states she is in [place] helping with grandchildren but she will call another friend to have them check on patient and hopefully take her to the ED. [name] states she will go if other friend is unable to go. [name] states she will call us back with an update.

## 2022-08-24 NOTE — Telephone Encounter (Signed)
[place] PD returned call after lunch. PD stated they had made contact with patient. Pt was found on the floor in the kitchen. Pt was unresponsive. PD/EMS performed a sternum rub on the patient and was unable to get vitals originally. They reported that patient hands and feet were purple. PD/EMS placed a warming blanket on pt and was able to get vitals and blood sugar. HR 82, BP--116/58, glucose- 71. PD reported to me they believe she was on the floor in the kitchen all night. Pt was transported to [facility] via EMS. Pt currently in the ED.

## 2022-08-24 NOTE — Telephone Encounter (Signed)
[name] returning heather's call. Please call back

## 2022-08-24 NOTE — H&P (Signed)
History and Physical    [name] [id number] DOB: 06/27/1956 DOA: 08/17/2022  PCP: Ann Held, DO  Patient coming from: Home  I have personally briefly reviewed patient's old medical records in [place]  Chief Complaint: Found down at home  HPI: [name] is a 67 y.o. female with medical history significant for alcohol associated liver cirrhosis, pancreatic cysts, HTN who presented to the ED via EMS after she was found down at home after a wellness check.  Patient does not recall events of the fall therefore history is otherwise supplemented by EDP and chart review.  Patient does state that she had a beer yesterday but does not remember falling.  Patient's PCP called a wellness check due to being unable to reach patient.  [employer] arrived to her house and her door was wide open.  She was found laying on the floor, cold to touch, with mottled extremities.  She was brought to the ED for further evaluation.  Patient states that she has been having some abdominal pain.  She has some nausea and vomiting a couple days ago but denies currently.  ED Course  Labs/Imaging on admission: I have personally reviewed following labs and imaging studies.  Initial vitals showed BP 84/70, pulse 70, RR 21, temp 90.0 F rectally, SpO2 100% on room air.  Labs showed sodium 116, potassium 4.9, chloride 77, bicarb 17, BUN 52, creatinine 1.32, serum glucose 106, AST 363, ALT 142, alk phos 62, total bilirubin 2.0, lipase 57, ammonia 15, CK 7591.  Serum ethanol <10.  WBC 13.1, hemoglobin 10.9, platelets 247,000.  INR 1.4.  Lactic acid 4.1 > 7.2 > 2.5.  Blood cultures in process.  Urinalysis showed moderate hemoglobin, negative nitrites, negative leukocytes, 0-5 RBCs and WBCs, no bacteria.  UDS pending.  Portable chest x-ray negative for focal consolidation, edema, effusion.  Pelvic x-ray negative for fracture or dislocation.  CT head without contrast negative for acute  intracranial process.  CT cervical spine without contrast negative for acute displaced fractures.  Patient was given 1 L LR and 1 L normal saline.  Repeat sodium 119.  EDP discussed with PCCM, patient was felt stable for stepdown.  The hospitalist service was consulted to admit for further evaluation and management.  Review of Systems: All systems reviewed and are negative except as documented in history of present illness above.   Past Medical History:  Diagnosis Date   Anxiety    Cancer ([place]) 04/2014   squamous cell carcinoma L inner thigh   Depression    Hard of hearing    Hypertension    Osteopenia    Substance abuse ([place])    TIA (transient ischemic attack) 2016    Past Surgical History:  Procedure Laterality Date   AUGMENTATION MAMMAPLASTY Bilateral 1988   BREAST ENHANCEMENT SURGERY     BUNIONECTOMY     ENDOSCOPIC RETROGRADE CHOLANGIOPANCREATOGRAPHY (ERCP) WITH PROPOFOL N/A 07/28/2021   Procedure: ENDOSCOPIC RETROGRADE CHOLANGIOPANCREATOGRAPHY (ERCP) WITH PROPOFOL;  Surgeon: Milus Banister, MD;  Location: [facility] ENDOSCOPY;  Service: Endoscopy;  Laterality: N/A;   GUM SURGERY  11/2017   NOSE SURGERY     Epistaxis   REMOVAL OF STONES  07/28/2021   Procedure: REMOVAL OF STONES;  Surgeon: Milus Banister, MD;  Location: [facility] ENDOSCOPY;  Service: Endoscopy;;   SPHINCTEROTOMY  07/28/2021   Procedure: Joan Mayans;  Surgeon: Milus Banister, MD;  Location: [facility] ENDOSCOPY;  Service: Endoscopy;;   TONSILLECTOMY      Social  History:  reports that she has never smoked. She has never been exposed to tobacco smoke. She has never used smokeless tobacco. She reports that she does not currently use alcohol after a past usage of about 4.0 - 6.0 standard drinks of alcohol per week. She reports that she does not use drugs.  Allergies  Allergen Reactions   Codeine Other (See Comments)    REACTION: insomnia and anxious    Family History  Problem Relation Age of Onset   Hypertension  Mother    Kidney disease Mother        dialysis   Alzheimer's disease Mother    Hyperlipidemia Father    Stroke Paternal Grandmother    Rheum arthritis Paternal Grandmother    Breast cancer Neg Hx    Colon cancer Neg Hx    Esophageal cancer Neg Hx    Rectal cancer Neg Hx    Stomach cancer Neg Hx      Prior to Admission medications   Medication Sig Start Date End Date Taking? Authorizing Provider  doxycycline (VIBRA-TABS) 100 MG tablet Take 1 tablet (100 mg total) by mouth 2 (two) times daily. 07/28/22   Ann Held, DO  folic acid (FOLVITE) 1 MG tablet Take 1 tablet (1 mg total) by mouth daily. 04/08/21   Antonieta Pert, MD  lisinopril (ZESTRIL) 40 MG tablet TAKE 1 TABLET BY MOUTH EVERY DAY 07/28/22   Carollee Herter, Alferd Apa, DO  magnesium oxide (MAG-OX) 400 (240 Mg) MG tablet Take 1 tablet (400 mg total) by mouth 2 (two) times daily. Patient taking differently: Take 400 mg by mouth daily. 04/08/21   Antonieta Pert, MD  melatonin 5 MG TABS Take 1 tablet (5 mg total) by mouth at bedtime as needed. 04/08/21   Antonieta Pert, MD  metoprolol succinate (TOPROL-XL) 100 MG 24 hr tablet Take 1 tablet (100 mg total) by mouth daily. Take with or immediately following a meal 06/26/22   Carollee Herter, Alferd Apa, DO  Multiple Vitamin (MULTIVITAMIN WITH MINERALS) TABS tablet Take 1 tablet by mouth daily. 04/08/21   Antonieta Pert, MD  Thiamine HCl (VITAMIN B1) 100 MG TABS Take 100 mg by mouth daily.    [provider]  urea (URE-NA) 15 g PACK oral packet Take 15 g by mouth daily. TAKE IT UNTIL YOU HAVE CONSISTENT SOLUTE INTAKE then STOP it once eating food well. Patient taking differently: Take 15 g by mouth every other day. TAKE IT UNTIL YOU HAVE CONSISTENT SOLUTE INTAKE then STOP it once eating food well. 04/08/21   Antonieta Pert, MD    Physical Exam: Vitals:   09/06/2022 1918 08/19/2022 2000 08/29/2022 2100 08/31/2022 2151  BP:  104/69 (!) 116/103   Pulse:  80 83   Resp:  (!) 22 18   Temp: (!) 95.1 F (35.1 C)    (!) 97.4 F (36.3 C)  TempSrc: Rectal   Oral  SpO2:  100% 100%   Weight:      Height:       Constitutional: Disheveled, resting in bed Eyes: EOMI, lids and conjunctivae normal ENMT: Mucous membranes are dry. Posterior pharynx clear of any exudate or lesions.Normal dentition.  Neck: normal, supple, no masses. Respiratory: clear to auscultation bilaterally, no wheezing, no crackles. Normal respiratory effort. No accessory muscle use.  Cardiovascular: Regular rate and rhythm, no murmurs / rubs / gallops. No extremity edema. 2+ pedal pulses. Abdomen: Mild epigastric tenderness, no masses palpated.  Soft, nondistended. Musculoskeletal: no clubbing / cyanosis. No  joint deformity upper and lower extremities. Good ROM, no contractures.  Skin: Laceration right first toe, pressure wound left first MTP, mottled appearance distal extremities. Neurologic: Sensation intact. Strength 5/5 in all 4.  Psychiatric: Awake, alert, oriented to person, place, year but not situation.  EKG: Personally reviewed. Sinus rhythm, rate 76, no acute ischemic changes.  Motion artifact present.  Assessment/Plan Principal Problem:   Hyponatremia Active Problems:   Rhabdomyolysis   Alcoholic cirrhosis (HCC)   Elevated LFTs   AKI (acute kidney injury) ([place])   Hypothermia   Essential hypertension   Alcohol abuse   [name] is a 67 y.o. female with medical history significant for alcohol associated liver cirrhosis, pancreatic cysts, HTN who is admitted with severe hyponatremia and rhabdomyolysis after she was found down at home.  Assessment and Plan: Severe hyponatremia: Appears hypovolemic on admission.  Initial sodium 116, repeat 119. -IV NS@75$  mL/hour -Check sodium q4h overnight -Obtain urine studies, serum osmolality  Rhabdomyolysis: Secondary to prolonged downtime.  CK 7591. -Continue IV fluid hydration -Repeat CK in a.m. -Monitor UOP  Alcohol associated liver cirrhosis: No significant  abdominal distention/ascites noted on exam.  Mental status improved compared to initial arrival.  Elevated LFTs: Secondary to alcohol use, hypoperfusion due to hypovolemia, underlying liver disease.  She is having some mild abdominal pain.  MRCP 08/01/2022 showed stable pancreatic cystic lesions, unchanged intra and extrahepatic biliary ductal dilatation with CBD measuring 10 mm.  She did not follow-up with [facility]. -Obtain RUQ ultrasound  Acute kidney injury: Mild in setting of hypovolemia and rhabdomyolysis.  Continue IV fluid hydration and repeat labs in AM.  Hypothermia: Initial temp 90 F, improved with Bair hugger.  Alcohol use: Reports drinking 1-2 beers daily.  Placed on CIWA protocol with Ativan as needed.  Hypertension: Holding antihypertensives as she was hypotensive on arrival.   DVT prophylaxis: heparin injection 5,000 Units Start: 08/16/2022 2215 Code Status: Full code Family Communication: None present on admission Disposition Plan: From home, dispo pending clinical progress Consults called: None Severity of Illness: The appropriate patient status for this patient is INPATIENT. Inpatient status is judged to be reasonable and necessary in order to provide the required intensity of service to ensure the patient's safety. The patient's presenting symptoms, physical exam findings, and initial radiographic and laboratory data in the context of their chronic comorbidities is felt to place them at high risk for further clinical deterioration. Furthermore, it is not anticipated that the patient will be medically stable for discharge from the hospital within 2 midnights of admission.   * I certify that at the point of admission it is my clinical judgment that the patient will require inpatient hospital care spanning beyond 2 midnights from the point of admission due to high intensity of service, high risk for further deterioration and high frequency of surveillance required.Zada Finders MD [facility]  If 7PM-7AM, please contact night-coverage [email or web address]  08/16/2022, 10:24 PM

## 2022-08-24 NOTE — Telephone Encounter (Signed)
Pt called. LVM advising of Dr. Ivy Lynn recommendations.

## 2022-08-24 NOTE — ED Notes (Signed)
MD Pickering notified of critical lactic 4.1 and Na 116

## 2022-08-24 NOTE — ED Triage Notes (Signed)
Pt arrived via [facility] from home. Pt's primary care dr called for a welfare check when they couldn't get a hold of her. [employer] arrived to pt's house and her door was wide open and pt was laying on floor. Per EMS, pt was cold to the touch and her extremities were mottled. Pt is A&OX2 to self and place.

## 2022-08-24 NOTE — Telephone Encounter (Signed)
Spoke with [name]. [name] was unable to reach patient and was unable to go check on her. Spoke with Maudie Mercury and was discussed to have a Comptroller. I called [place] department to send out police/EMS.

## 2022-08-24 NOTE — ED Provider Notes (Signed)
  Patient signed out to me at 1600 by Dr. Alvino Chapel pending labs, imaging and reassessment.  In short this is a 67 year old female with a past medical history of hypertension and alcohol use disorder presenting to the emergency department after being found down and altered.  Patient was found in her home with the door open.  She states that she thinks that she may have fell but does not remember the fall.  She states that she did drink beer yesterday.  The patient was initially hypothermic and hypotensive on arrival and was placed on a Bair hugger and given IV fluids.  Upon my evaluation, the patient's blood pressure has improved to the 983J systolic.  The patient is able to follow commands in all 4 extremities though does appear confused.  She is oriented to person and place.  Her labs are pending at this time.  She is stable to have CT head and C-spine additionally performed to evaluate for traumatic injury from the fall.  She does have pressure wounds on her toes but no other signs of injury on exam and is not complaining of any pain.  Clinical Course as of 08/29/2022 2106  Thu Aug 24, 2022  1900 Patient CTs show no acute traumatic injury.  Lactates are uptrending and in the setting of her hyponatremia and rhabdo she will have ICU consulted for admission. [VK]  1905 I spoke with ICU who recommended calling back after repeat lactic and Na result. With improvement of mental status and Na on VBG, they think she may be stable for the floors. Elevated lactic is expected per critical care in the setting of her transaminitis and likely alcoholic liver disease. [VK]  2029 Na improved to 119 and lactic improved to 2.5. I spoke with Dr. [facility] who states she is stable for SDU. Hospitalist will be called for admission. [VK]    Clinical Course User Index [VK] Kemper Durie, DO   CRITICAL CARE Performed by: Kemper Durie   Total critical care time: 35 minutes  Critical care time was exclusive  of separately billable procedures and treating other patients.  Critical care was necessary to treat or prevent imminent or life-threatening deterioration.  Critical care was time spent personally by me on the following activities: development of treatment plan with patient and/or surrogate as well as nursing, discussions with consultants, evaluation of patient's response to treatment, examination of patient, obtaining history from patient or surrogate, ordering and performing treatments and interventions, ordering and review of laboratory studies, ordering and review of radiographic studies, pulse oximetry and re-evaluation of patient's condition.    Kemper Durie, DO 08/31/2022 2106

## 2022-08-25 ENCOUNTER — Ambulatory Visit (INDEPENDENT_AMBULATORY_CARE_PROVIDER_SITE_OTHER): Payer: Medicare Other | Admitting: Podiatry

## 2022-08-25 ENCOUNTER — Ambulatory Visit (HOSPITAL_COMMUNITY): Admission: RE | Admit: 2022-08-25 | Payer: Medicare Other | Source: Ambulatory Visit

## 2022-08-25 DIAGNOSIS — Z91199 Patient's noncompliance with other medical treatment and regimen due to unspecified reason: Secondary | ICD-10-CM

## 2022-08-25 DIAGNOSIS — E871 Hypo-osmolality and hyponatremia: Secondary | ICD-10-CM | POA: Diagnosis not present

## 2022-08-25 LAB — BASIC METABOLIC PANEL
Anion gap: 14 (ref 5–15)
BUN: 32 mg/dL — ABNORMAL HIGH (ref 8–23)
CO2: 17 mmol/L — ABNORMAL LOW (ref 22–32)
Calcium: 7.8 mg/dL — ABNORMAL LOW (ref 8.9–10.3)
Chloride: 89 mmol/L — ABNORMAL LOW (ref 98–111)
Creatinine, Ser: 0.94 mg/dL (ref 0.44–1.00)
GFR, Estimated: >60 mL/min (ref >60)
Glucose, Bld: 85 mg/dL (ref 70–99)
Potassium: 3.6 mmol/L (ref 3.5–5.1)
Sodium: 120 mmol/L — ABNORMAL LOW (ref 135–145)

## 2022-08-25 LAB — COMPREHENSIVE METABOLIC PANEL
ALT: 118 U/L — ABNORMAL HIGH (ref 0–44)
AST: 386 U/L — ABNORMAL HIGH (ref 15–41)
Albumin: 3.3 g/dL — ABNORMAL LOW (ref 3.5–5.0)
Alkaline Phosphatase: 56 U/L (ref 38–126)
Anion gap: 19 — ABNORMAL HIGH (ref 5–15)
BUN: 40 mg/dL — ABNORMAL HIGH (ref 8–23)
CO2: 15 mmol/L — ABNORMAL LOW (ref 22–32)
Calcium: 8.1 mg/dL — ABNORMAL LOW (ref 8.9–10.3)
Chloride: 86 mmol/L — ABNORMAL LOW (ref 98–111)
Creatinine, Ser: 1.04 mg/dL — ABNORMAL HIGH (ref 0.44–1.00)
GFR, Estimated: 60 mL/min — ABNORMAL LOW (ref >60)
Glucose, Bld: 81 mg/dL (ref 70–99)
Potassium: 4.1 mmol/L (ref 3.5–5.1)
Sodium: 120 mmol/L — ABNORMAL LOW (ref 135–145)
Total Bilirubin: 1.7 mg/dL — ABNORMAL HIGH (ref 0.3–1.2)
Total Protein: 5.2 g/dL — ABNORMAL LOW (ref 6.5–8.1)

## 2022-08-25 LAB — CBC
HCT: 27.8 % — ABNORMAL LOW (ref 36.0–46.0)
Hemoglobin: 10.1 g/dL — ABNORMAL LOW (ref 12.0–15.0)
MCH: 38 pg — ABNORMAL HIGH (ref 26.0–34.0)
MCHC: 36.3 g/dL — ABNORMAL HIGH (ref 30.0–36.0)
MCV: 104.5 fL — ABNORMAL HIGH (ref 80.0–100.0)
Platelets: 269 10*3/uL (ref 150–400)
RBC: 2.66 MIL/uL — ABNORMAL LOW (ref 3.87–5.11)
RDW: 11.8 % (ref 11.5–15.5)
WBC: 11.8 10*3/uL — ABNORMAL HIGH (ref 4.0–10.5)
nRBC: 1.1 % — ABNORMAL HIGH (ref 0.0–0.2)

## 2022-08-25 LAB — SODIUM: Sodium: 124 mmol/L — ABNORMAL LOW (ref 135–145)

## 2022-08-25 LAB — PROTIME-INR
INR: 1.3 — ABNORMAL HIGH (ref 0.8–1.2)
Prothrombin Time: 16.2 seconds — ABNORMAL HIGH (ref 11.4–15.2)

## 2022-08-25 LAB — HIV ANTIBODY (ROUTINE TESTING W REFLEX): HIV Screen 4th Generation wRfx: NONREACTIVE

## 2022-08-25 LAB — OSMOLALITY: Osmolality: 270 mOsm/kg — ABNORMAL LOW (ref 275–295)

## 2022-08-25 LAB — CK: Total CK: 6090 U/L — ABNORMAL HIGH (ref 38–234)

## 2022-08-25 NOTE — Assessment & Plan Note (Signed)
Noted. Monitor for signs of withdrawal.

## 2022-08-25 NOTE — Addendum Note (Signed)
Addended by: Edrick Kins on: 08/25/2022 12:41 PM   Modules accepted: Level of Service

## 2022-08-25 NOTE — ED Notes (Signed)
Pt linen and external female catheter changed. Provided new linen and offered hydration. Comfort measures where met. Patient has no complaints at this time. Bed in lowest position. Call bell within reach.

## 2022-08-25 NOTE — Assessment & Plan Note (Signed)
Continue IV fluid supplementation, but cautious out of concern for repleting sodium too quickly.

## 2022-08-25 NOTE — Assessment & Plan Note (Signed)
Baseline creatinine appears to be 0.75. Admission creatinine was elevated 1t 1.10. Improved to 1.04. Monitor creatinine, electrolytes and volume status.

## 2022-08-25 NOTE — Assessment & Plan Note (Signed)
Elevated slightly over LFT's at admission. Likely reaching a plateau. Likely primarily due to alcohol given the AST/ALT ratio, although elevated CK will also result in elevated LFT's.

## 2022-08-25 NOTE — Assessment & Plan Note (Signed)
The patient is currently normotensive without antihypertensives. 

## 2022-08-25 NOTE — Assessment & Plan Note (Addendum)
Appears to be hypovolemic in etiology. Improved to 124 since admission. Will recheck Na this afternoon and tonight. Continue NS supplementation.

## 2022-08-25 NOTE — Assessment & Plan Note (Addendum)
92 degrees rectal on admission. Now 98.4. Likely due to exposure as the patient was found down on the floor in her home with the door open.

## 2022-08-25 NOTE — Progress Notes (Addendum)
Patient currently admitted in the hospital.

## 2022-08-25 NOTE — ED Notes (Signed)
ED TO INPATIENT HANDOFF REPORT  ED Nurse Name and Phone #: Overton Mam [phone]  S Name/Age/Gender [name] 67 y.o. female Room/Bed: 005C/005C  Code Status   Code Status: Full Code  Home/SNF/Other Home Patient oriented to: self and place Is this baseline? No   Triage Complete: Triage complete  Chief Complaint Hyponatremia [E87.1]  Triage Note Pt arrived via [facility] from home. Pt's primary care dr called for a welfare check when they couldn't get a hold of her. [employer] arrived to pt's house and her door was wide open and pt was laying on floor. Per EMS, pt was cold to the touch and her extremities were mottled. Pt is A&OX2 to self and place.    Allergies Allergies  Allergen Reactions   Codeine Other (See Comments)    REACTION: insomnia and anxious    Level of Care/Admitting Diagnosis ED Disposition     ED Disposition  Admit   Condition  --   [place]: [place] [[id number]]  Level of Care: Progressive [102]  Admit to Progressive based on following criteria: MULTISYSTEM THREATS such as stable sepsis, metabolic/electrolyte imbalance with or without encephalopathy that is responding to early treatment.  May admit patient to [facility] or [facility] if equivalent level of care is available:: No  Covid Evaluation: Asymptomatic - no recent exposure (last 10 days) testing not required  Diagnosis: Hyponatremia [198519]  Admitting Physician: Lenore Cordia [id number]  Attending Physician: Lenore Cordia [id number]  Certification:: I certify this patient will need inpatient services for at least 2 midnights  Estimated Length of Stay: 3          B Medical/Surgery History Past Medical History:  Diagnosis Date   Anxiety    Cancer ([place]) 04/2014   squamous cell carcinoma L inner thigh   Depression    Hard of hearing    Hypertension    Osteopenia    Substance abuse ([place])    TIA (transient ischemic attack) 2016   Past Surgical History:   Procedure Laterality Date   AUGMENTATION MAMMAPLASTY Bilateral 1988   BREAST ENHANCEMENT SURGERY     BUNIONECTOMY     ENDOSCOPIC RETROGRADE CHOLANGIOPANCREATOGRAPHY (ERCP) WITH PROPOFOL N/A 07/28/2021   Procedure: ENDOSCOPIC RETROGRADE CHOLANGIOPANCREATOGRAPHY (ERCP) WITH PROPOFOL;  Surgeon: Milus Banister, MD;  Location: [facility];  Service: Endoscopy;  Laterality: N/A;   GUM SURGERY  11/2017   NOSE SURGERY     Epistaxis   REMOVAL OF STONES  07/28/2021   Procedure: REMOVAL OF STONES;  Surgeon: Milus Banister, MD;  Location: [facility];  Service: Endoscopy;;   SPHINCTEROTOMY  07/28/2021   Procedure: Joan Mayans;  Surgeon: Milus Banister, MD;  Location: [facility];  Service: Endoscopy;;   TONSILLECTOMY       A IV Location/Drains/Wounds Patient Lines/Drains/Airways Status     Active Line/Drains/Airways     Name Placement date Placement time Site Days   Peripheral IV 08/30/2022 20 G Right Antecubital 09/05/2022  --  Antecubital  1   External Urinary Catheter 03/29/21  0620  --  514   Wound / Incision (Open or Dehisced) 03/23/21 Other (Comment) Knee Anterior;Right abrasion 03/23/21  0015  Knee  520   Wound / Incision (Open or Dehisced) 03/23/21 Other (Comment) Knee Anterior;Left healing abrasion 03/23/21  0015  Knee  520            Intake/Output Last 24 hours  Intake/Output Summary (Last 24 hours) at 08/25/2022 1310 Last data filed  at 08/10/2022 1645 Gross per 24 hour  Intake 2000 ml  Output --  Net 2000 ml    Labs/Imaging Results for orders placed or performed during the hospital encounter of 09/03/2022 (from the past 48 hour(s))  Lactic acid, plasma     Status: Abnormal   Collection Time: 09/02/2022  2:25 PM  Result Value Ref Range   Lactic Acid, Venous 4.1 (HH) 0.5 - 1.9 mmol/L    Comment: CRITICAL RESULT CALLED TO, READ BACK BY AND VERIFIED WITH Octavia Heir, RN @ [phone] 08/20/2022 BY SEKDAHL Performed at [facility], [place] [address], [place], [place]  [address]   Ammonia     Status: None   Collection Time: 08/31/2022  2:26 PM  Result Value Ref Range   Ammonia 15 9 - 35 umol/L    Comment: Performed at [facility], [place] [address], [place], [place] [address]  Comprehensive metabolic panel     Status: Abnormal   Collection Time: 08/11/2022  3:25 PM  Result Value Ref Range   Sodium 116 (LL) 135 - 145 mmol/L    Comment: CRITICAL RESULT CALLED TO, READ BACK BY AND VERIFIED WITH BBlossom Hoops, RN @ 1637 09/01/2022 BY SEKDAHL   Potassium 4.9 3.5 - 5.1 mmol/L   Chloride 77 (L) 98 - 111 mmol/L   CO2 17 (L) 22 - 32 mmol/L   Glucose, Bld 106 (H) 70 - 99 mg/dL    Comment: Glucose reference range applies only to samples taken after fasting for at least 8 hours.   BUN 52 (H) 8 - 23 mg/dL   Creatinine, Ser 1.32 (H) 0.44 - 1.00 mg/dL   Calcium 8.8 (L) 8.9 - 10.3 mg/dL   Total Protein 6.1 (L) 6.5 - 8.1 g/dL   Albumin 3.8 3.5 - 5.0 g/dL   AST 363 (H) 15 - 41 U/L   ALT 142 (H) 0 - 44 U/L   Alkaline Phosphatase 62 38 - 126 U/L   Total Bilirubin 2.0 (H) 0.3 - 1.2 mg/dL   GFR, Estimated 45 (L) >60 mL/min    Comment: (NOTE) Calculated using the CKD-EPI Creatinine Equation (2021)    Anion gap 22 (H) 5 - 15    Comment: ELECTROLYTES REPEATED TO VERIFY Performed at [facility], [address] [place], [place] [address]   Lipase, blood     Status: Abnormal   Collection Time: 08/17/2022  3:25 PM  Result Value Ref Range   Lipase 57 (H) 11 - 51 U/L    Comment: Performed at [place] [address] [place], [place] [address]  Ethanol     Status: None   Collection Time: 08/12/2022  3:25 PM  Result Value Ref Range   Alcohol, Ethyl (B) <10 <10 mg/dL    Comment: (NOTE) Lowest detectable limit for serum alcohol is 10 mg/dL.  For medical purposes only. Performed at [facility], [place] [address], [place], [place] [address]   CBC with Differential     Status: Abnormal   Collection Time: 09/04/2022  3:25 PM  Result Value Ref Range   WBC 13.1  (H) 4.0 - 10.5 K/uL   RBC 2.85 (L) 3.87 - 5.11 MIL/uL   Hemoglobin 10.9 (L) 12.0 - 15.0 g/dL   HCT 29.2 (L) 36.0 - 46.0 %   MCV 102.5 (H) 80.0 - 100.0 fL   MCH 38.2 (H) 26.0 - 34.0 pg   MCHC 37.3 (H) 30.0 - 36.0 g/dL  Comment: REPEATED TO VERIFY CORRECTED FOR COLD AGGLUTININS    RDW 11.2 (L) 11.5 - 15.5 %   Platelets 247 150 - 400 K/uL   nRBC 1.4 (H) 0.0 - 0.2 %   Neutrophils Relative % 76 %   Neutro Abs 10.0 (H) 1.7 - 7.7 K/uL   Lymphocytes Relative 8 %   Lymphs Abs 1.1 0.7 - 4.0 K/uL   Monocytes Relative 14 %   Monocytes Absolute 1.8 (H) 0.1 - 1.0 K/uL   Eosinophils Relative 0 %   Eosinophils Absolute 0.0 0.0 - 0.5 K/uL   Basophils Relative 0 %   Basophils Absolute 0.0 0.0 - 0.1 K/uL   Immature Granulocytes 2 %   Abs Immature Granulocytes 0.19 (H) 0.00 - 0.07 K/uL    Comment: Performed at [facility] [address] [place], [place] [address]  CK     Status: Abnormal   Collection Time: 08/20/2022  3:25 PM  Result Value Ref Range   Total CK 7,591 (H) 38 - 234 U/L    Comment: RESULT CONFIRMED BY MANUAL DILUTION Performed at [facility], [place] [address] [place], [place] [address]   Culture, blood (routine x 2)     Status: None (Preliminary result)   Collection Time: 08/12/2022  3:25 PM   Specimen: BLOOD  Result Value Ref Range   Specimen Description BLOOD SITE NOT SPECIFIED    Special Requests      BOTTLES DRAWN AEROBIC AND ANAEROBIC Blood Culture results may not be optimal due to an inadequate volume of blood received in culture bottles   Culture      NO GROWTH < 24 HOURS Performed at [facility], [place] [address], [place], [place] [address]    Report Status PENDING   Protime-INR     Status: Abnormal   Collection Time: 09/05/2022  3:25 PM  Result Value Ref Range   Prothrombin Time 16.6 (H) 11.4 - 15.2 seconds   INR 1.4 (H) 0.8 - 1.2    Comment: (NOTE) INR goal varies based on device and disease states. Performed at [facility], [place]  [address] [place], [place] [address]   Lactic acid, plasma     Status: Abnormal   Collection Time: 08/23/2022  4:58 PM  Result Value Ref Range   Lactic Acid, Venous 7.2 (HH) 0.5 - 1.9 mmol/L    Comment: CRITICAL VALUE NOTED. VALUE IS CONSISTENT WITH PREVIOUSLY REPORTED/CALLED VALUE Performed at [facility], [place] [address] [place], [place] [address]   I-Stat venous blood gas, [facility], [facility], [facility])     Status: Abnormal   Collection Time: 08/11/2022  6:07 PM  Result Value Ref Range   pH, Ven 7.346 7.25 - 7.43   pCO2, Ven 21.7 (L) 44 - 60 mmHg   pO2, Ven 38 32 - 45 mmHg   Bicarbonate 11.9 (L) 20.0 - 28.0 mmol/L   TCO2 13 (L) 22 - 32 mmol/L   O2 Saturation 71 %   Acid-base deficit 12.0 (H) 0.0 - 2.0 mmol/L   Sodium 123 (L) 135 - 145 mmol/L   Potassium 3.2 (L) 3.5 - 5.1 mmol/L   Calcium, Ion 0.90 (L) 1.15 - 1.40 mmol/L   HCT 24.0 (L) 36.0 - 46.0 %   Hemoglobin 8.2 (L) 12.0 - 15.0 g/dL   Sample type VENOUS    Comment NOTIFIED PHYSICIAN   Urinalysis, w/ Reflex to Culture (Infection Suspected) -Urine, Unspecified Source     Status: Abnormal  Collection Time: 09/02/2022  7:15 PM  Result Value Ref Range   Specimen Source URINE, UNSPE    Color, Urine YELLOW YELLOW   APPearance CLEAR CLEAR   Specific Gravity, Urine 1.011 1.005 - 1.030   pH 5.0 5.0 - 8.0   Glucose, UA NEGATIVE NEGATIVE mg/dL   Hgb urine dipstick MODERATE (A) NEGATIVE   Bilirubin Urine NEGATIVE NEGATIVE   Ketones, ur 5 (A) NEGATIVE mg/dL   Protein, ur NEGATIVE NEGATIVE mg/dL   Nitrite NEGATIVE NEGATIVE   Leukocytes,Ua NEGATIVE NEGATIVE   RBC / HPF 0-5 0 - 5 RBC/hpf   WBC, UA 0-5 0 - 5 WBC/hpf    Comment:        Reflex urine culture not performed if WBC <=10, OR if Squamous epithelial cells >5. If Squamous epithelial cells >5 suggest recollection.    Bacteria, UA NONE SEEN NONE SEEN   Squamous Epithelial / HPF 0-5 0 - 5 /HPF    Comment: Performed at [facility], [place] [address], [place], [place] [address]  Lactic  acid, plasma     Status: Abnormal   Collection Time: 08/19/2022  7:15 PM  Result Value Ref Range   Lactic Acid, Venous 2.5 (HH) 0.5 - 1.9 mmol/L    Comment: CRITICAL VALUE NOTED. VALUE IS CONSISTENT WITH PREVIOUSLY REPORTED/CALLED VALUE Performed at [facility], [place] [address], [place], [place] [address]   Comprehensive metabolic panel     Status: Abnormal   Collection Time: 08/16/2022  7:15 PM  Result Value Ref Range   Sodium 119 (LL) 135 - 145 mmol/L    Comment: CRITICAL RESULT CALLED TO, READ BACK BY AND VERIFIED WITH Ledell Peoples, RN @ 2017 08/25/2022 BY SEKDAHL   Potassium 3.8 3.5 - 5.1 mmol/L   Chloride 87 (L) 98 - 111 mmol/L   CO2 14 (L) 22 - 32 mmol/L   Glucose, Bld 90 70 - 99 mg/dL    Comment: Glucose reference range applies only to samples taken after fasting for at least 8 hours.   BUN 46 (H) 8 - 23 mg/dL   Creatinine, Ser 1.10 (H) 0.44 - 1.00 mg/dL   Calcium 7.8 (L) 8.9 - 10.3 mg/dL   Total Protein 5.0 (L) 6.5 - 8.1 g/dL   Albumin 3.1 (L) 3.5 - 5.0 g/dL   AST 370 (H) 15 - 41 U/L   ALT 116 (H) 0 - 44 U/L   Alkaline Phosphatase 52 38 - 126 U/L   Total Bilirubin 1.5 (H) 0.3 - 1.2 mg/dL   GFR, Estimated 56 (L) >60 mL/min    Comment: (NOTE) Calculated using the CKD-EPI Creatinine Equation (2021)    Anion gap 18 (H) 5 - 15    Comment: Performed at [facility], [place] [address], [place], [place] [address]  Lactic acid, plasma     Status: None   Collection Time: 08/28/2022  9:49 PM  Result Value Ref Range   Lactic Acid, Venous 1.9 0.5 - 1.9 mmol/L    Comment: Performed at [facility] [address], [place], [place] [address]  Osmolality     Status: Abnormal   Collection Time: 08/25/22  2:52 AM  Result Value Ref Range   Osmolality 270 (L) 275 - 295 mOsm/kg    Comment: Performed at [facility], [place] [address] [place], [place] [address]  HIV Antibody (routine testing w rflx)     Status: None   Collection Time: 08/25/22  2:52 AM  Result Value Ref Range    HIV Screen 4th Generation wRfx Non Reactive Non Reactive    Comment: Performed at [facility], [place] [address] [place], [place] [address]  CBC     Status: Abnormal   Collection Time: 08/25/22  2:52 AM  Result Value Ref Range   WBC 11.8 (H) 4.0 - 10.5 K/uL   RBC 2.66 (L) 3.87 - 5.11 MIL/uL   Hemoglobin 10.1 (L) 12.0 - 15.0 g/dL   HCT 27.8 (L) 36.0 - 46.0 %   MCV 104.5 (H) 80.0 - 100.0 fL   MCH 38.0 (H) 26.0 - 34.0 pg   MCHC 36.3 (H) 30.0 - 36.0 g/dL    Comment: CORRECTED FOR COLD AGGLUTININS   RDW 11.8 11.5 - 15.5 %   Platelets 269 150 - 400 K/uL   nRBC 1.1 (H) 0.0 - 0.2 %    Comment: Performed at [facility], [place] [address] [place], [place] [address]  Protime-INR     Status: Abnormal   Collection Time: 08/25/22  2:52 AM  Result Value Ref Range   Prothrombin Time 16.2 (H) 11.4 - 15.2 seconds   INR 1.3 (H) 0.8 - 1.2    Comment: (NOTE) INR goal varies based on device and disease states. Performed at [facility], [place] [address], [place], [place] [address]   CK     Status: Abnormal   Collection Time: 08/25/22  2:52 AM  Result Value Ref Range   Total CK 6,090 (H) 38 - 234 U/L    Comment: RESULT CONFIRMED BY MANUAL DILUTION Performed at [facility], [place] [address], [place], [place] [address]   Comprehensive metabolic panel     Status: Abnormal   Collection Time: 08/25/22  2:52 AM  Result Value Ref Range   Sodium 120 (L) 135 - 145 mmol/L   Potassium 4.1 3.5 - 5.1 mmol/L   Chloride 86 (L) 98 - 111 mmol/L   CO2 15 (L) 22 - 32 mmol/L   Glucose, Bld 81 70 - 99 mg/dL    Comment: Glucose reference range applies only to samples taken after fasting for at least 8 hours.   BUN 40 (H) 8 - 23 mg/dL   Creatinine, Ser 1.04 (H) 0.44 - 1.00 mg/dL   Calcium 8.1 (L) 8.9 - 10.3 mg/dL   Total Protein 5.2 (L) 6.5 - 8.1 g/dL   Albumin 3.3 (L) 3.5 - 5.0 g/dL   AST 386 (H) 15 - 41 U/L   ALT 118 (H) 0 - 44 U/L   Alkaline Phosphatase 56 38 - 126 U/L   Total Bilirubin  1.7 (H) 0.3 - 1.2 mg/dL   GFR, Estimated 60 (L) >60 mL/min    Comment: (NOTE) Calculated using the CKD-EPI Creatinine Equation (2021)    Anion gap 19 (H) 5 - 15    Comment: Performed at [facility], [place] [address], [place], [place] [address]  Sodium     Status: Abnormal   Collection Time: 08/25/22  9:22 AM  Result Value Ref Range   Sodium 124 (L) 135 - 145 mmol/L    Comment: Performed at [facility] [address], [place], [place] [address]   US Abdomen Limited RUQ (LIVER/GB)  Result Date: 08/23/2022 CLINICAL DATA:  Elevated LFT EXAM: ULTRASOUND ABDOMEN LIMITED RIGHT UPPER QUADRANT COMPARISON:  MRI 08/01/2022 FINDINGS: Gallbladder: No gallstones or wall thickening visualized. No sonographic Murphy sign noted by sonographer. Common bile duct: Diameter: 7.6  mm Liver: Echogenic liver parenchyma. No focal hepatic abnormality. Portal vein is patent on color Doppler imaging with normal direction of blood flow towards the liver. Other: Small volume perihepatic ascites IMPRESSION: 1. Echogenic liver parenchyma consistent with hepatic steatosis and or hepatocellular disease. 2. Upper normal to slightly enlarged common bile duct but stable to slightly decreased compared to MRI from January Electronically Signed   By: Donavan Foil M.D.   On: 09/01/2022 23:20   CT Head Wo Contrast  Result Date: 09/03/2022 CLINICAL DATA:  Mental status change EXAM: CT HEAD WITHOUT CONTRAST TECHNIQUE: Contiguous axial images were obtained from the base of the skull through the vertex without intravenous contrast. RADIATION DOSE REDUCTION: This exam was performed according to the departmental dose-optimization program which includes automated exposure control, adjustment of the mA and/or kV according to patient size and/or use of iterative reconstruction technique. COMPARISON:  Head CT 02/21/2021 FINDINGS: Brain: No evidence of acute infarction, hemorrhage, hydrocephalus, extra-axial collection or mass  lesion/mass effect. Again seen is moderate diffuse atrophy with compensatory dilatation of the ventricular system. This is unchanged from prior. There is stable mild periventricular white matter hypodensity, likely chronic small vessel ischemic change. Vascular: Atherosclerotic calcifications are present within the cavernous internal carotid arteries. Skull: Normal. Negative for fracture or focal lesion. Sinuses/Orbits: No acute finding. Other: None. IMPRESSION: 1. No acute intracranial process. 2. Stable moderate diffuse atrophy with compensatory dilatation of the ventricular system. 3. Stable mild periventricular white matter hypodensity, likely chronic small vessel ischemic change. Electronically Signed   By: Ronney Asters M.D.   On: 08/29/2022 18:52   CT Cervical Spine Wo Contrast  Result Date: 09/03/2022 CLINICAL DATA:  Neck trauma.  Patient was found on the floor. EXAM: CT CERVICAL SPINE WITHOUT CONTRAST TECHNIQUE: Multidetector CT imaging of the cervical spine was performed without intravenous contrast. Multiplanar CT image reconstructions were also generated. RADIATION DOSE REDUCTION: This exam was performed according to the departmental dose-optimization program which includes automated exposure control, adjustment of the mA and/or kV according to patient size and/or use of iterative reconstruction technique. COMPARISON:  None Available. FINDINGS: Alignment: Normal alignment. Skull base and vertebrae: Skull base appears intact. No vertebral compression deformities. No focal bone lesion or bone destruction. Soft tissues and spinal canal: No prevertebral soft tissue swelling. No abnormal paraspinal soft tissue mass or infiltration. Disc levels: Degenerative changes throughout with disc space narrowing and endplate osteophyte formation. Degenerative changes in the posterior facet joints. Uncovertebral spurring and facet joint hypertrophy cause bone encroachment on multiple neural foramina bilaterally.  Upper chest: Moderate bilateral pleural effusions are demonstrated. Other: Degenerative changes in the right shoulder with heterotopic ossification. IMPRESSION: Normal alignment. Severe diffuse degenerative changes. No acute displaced fractures are identified. Electronically Signed   By: Lucienne Capers M.D.   On: 08/26/2022 18:48   DG Pelvis Portable  Addendum Date: 08/26/2022   ADDENDUM REPORT: 08/11/2022 16:38 ADDENDUM: Addendum is made to correct voice recognition error in the initially issued report. Impression should read "No displaced fracture or dislocation of the pelvis or bilateral proximal femurs seen in single frontal view." Electronically Signed   By: Delanna Ahmadi M.D.   On: 08/29/2022 16:38   Result Date: 08/21/2022 CLINICAL DATA:  Altered mental status, fall EXAM: PORTABLE PELVIS 1-2 VIEWS COMPARISON:  None Available. FINDINGS: There is no evidence of displaced pelvic fracture or diastasis. No pelvic bone lesions are seen. IMPRESSION: Displaced fracture or dislocation of the pelvis or bilateral proximal femurs seen in single frontal view. Electronically  Signed: By: Delanna Ahmadi M.D. On: 08/26/2022 15:35   DG Chest Portable 1 View  Result Date: 08/19/2022 CLINICAL DATA:  Altered mental status.  Fall. EXAM: PORTABLE CHEST 1 VIEW COMPARISON:  Chest radiograph dated June 13, 2015 FINDINGS: The heart size and mediastinal contours are within normal limits. Both lungs are clear. The visualized skeletal structures are unremarkable. Soft tissue densities projecting over the bilateral hemithorax suggesting breast implants IMPRESSION: No active disease. Electronically Signed   By: Keane Police D.O.   On: 08/15/2022 15:30    Pending Labs Unresulted Labs (From admission, onward)     Start     Ordered   08/21/2022 2213  Sodium  Now then every 4 hours,   R      08/23/2022 2212   08/30/2022 2159  Osmolality, urine  Add-on,   AD        08/23/2022 2158   08/15/2022 2159  Sodium, urine, random  Add-on,    AD        09/02/2022 2158   08/31/2022 1505  Urine rapid drug screen (hosp performed)  Once,   STAT        08/10/2022 1504   09/04/2022 1425  Culture, blood (routine x 2)  BLOOD CULTURE X 2,   R      08/20/2022 1425            Vitals/Pain Today's Vitals   08/25/22 0915 08/25/22 0945 08/25/22 1230 08/25/22 1310  BP:   (!) 141/76   Pulse: 89 99 87   Resp:   18   Temp:    98.7 F (37.1 C)  TempSrc:    Oral  SpO2: 97% 99% 100%   Weight:      Height:        Isolation Precautions No active isolations  Medications Medications  heparin injection 5,000 Units (5,000 Units Subcutaneous Given 08/25/22 0601)  sodium chloride flush (NS) 0.9 % injection 3 mL (3 mLs Intravenous Given 08/25/22 0912)  0.9 %  sodium chloride infusion (0 mLs Intravenous Stopped 08/25/22 1219)  ondansetron (ZOFRAN) tablet 4 mg (has no administration in time range)    Or  ondansetron (ZOFRAN) injection 4 mg (has no administration in time range)  senna-docusate (Senokot-S) tablet 1 tablet (has no administration in time range)  LORazepam (ATIVAN) tablet 1-4 mg (has no administration in time range)  thiamine (VITAMIN B1) tablet 100 mg (100 mg Oral Given 08/25/22 0902)    Or  thiamine (VITAMIN B1) injection 100 mg ( Intravenous See Alternative [id number])  folic acid (FOLVITE) tablet 1 mg (1 mg Oral Given 08/25/22 0902)  multivitamin with minerals tablet 1 tablet (1 tablet Oral Given 08/25/22 0902)  sodium chloride 0.9 % bolus 1,000 mL (0 mLs Intravenous Stopped 08/18/2022 1630)  lactated ringers bolus 1,000 mL (0 mLs Intravenous Stopped 08/30/2022 1645)  0.9 %  sodium chloride infusion (0 mLs Intravenous Stopped 08/25/22 0230)    Mobility walks with device     Focused Assessments Pulmonary Assessment Handoff:  Lung sounds:   O2 Device: Room Air      R Recommendations: See Admitting Provider Note  Report given to:   Additional Notes: Lives at home alone, says she has people that come to check on her but is  thinks she might need more help at home.

## 2022-08-25 NOTE — Progress Notes (Signed)
PROGRESS NOTE  [name] [id number] DOB: [birth year] DOA: 09/03/2022 PCP: Ann Held, DO  Brief History   [name] is a 67 y.o. female with medical history significant for alcohol associated liver cirrhosis, pancreatic cysts, HTN who presented to the ED via EMS after she was found down at home after a wellness check.  Patient does not recall events of the fall therefore history is otherwise supplemented by EDP and chart review.   Patient does state that she had a beer yesterday but does not remember falling.  Patient's PCP called a wellness check due to being unable to reach patient.   [facility] arrived to her house and her door was wide open.  She was found laying on the floor, cold to touch, with mottled extremities.  She was brought to the ED for further evaluation.   Patient states that she has been having some abdominal pain.  She has some nausea and vomiting a couple days ago but denies currently.  The patient has been admitted to a telemetry bed. She is receiving IV fluids. Her electrolytes are being closely monitored. Currently IV fluids are cautious at 75 cc/hr due to the patient's hyponatremia out of concern for correcting the sodium too quickly. Monitor CK and sodium carefully.  Consultants  None  Procedures  None  Antibiotics   Anti-infectives (From admission, onward)    None        Subjective  The patient is resting quietly. She is complaining of hurting all over.  Objective   Vitals:  Vitals:   08/25/22 1230 08/25/22 1310  BP: (!) 141/76   Pulse: 87   Resp: 18   Temp:  98.7 F (37.1 C)  SpO2: 100%     Exam:  Constitutional:  Appears calm and comfortable. Somnolent. Respiratory:  CTA bilaterally, no w/r/r.  Respiratory effort normal. No retractions or accessory muscle use Cardiovascular:  RRR, no m/r/g No LE extremity edema   Normal pedal pulses Abdomen:  Abdomen appears normal; no tenderness or masses No hernias No  HSM Musculoskeletal:  Digits/nails BUE: no clubbing, cyanosis, petechiae, infection exam of joints, bones, muscles of at least one of following: head/neck, RUE, LUE, RLE, LLE   strength and tone normal, no atrophy, no abnormal movements No tenderness, masses Normal ROM, no contractures  gait and station Skin:  No rashes, lesions, ulcers palpation of skin: no induration or nodules Ulcerations on right great toe and left first metatarsal joint. Neurologic:  CN 2-12 intact Sensation all 4 extremities intact Psychiatric:  Mental status Mood, affect appropriate Orientation to person, place, time  judgment and insight appear intact     I have personally reviewed the following:   Today's Data   Today's Vitals   08/25/22 0915 08/25/22 0945 08/25/22 1230 08/25/22 1310  BP:   (!) 141/76   Pulse: 89 99 87   Resp:   18   Temp:    98.7 F (37.1 C)  TempSrc:    Oral  SpO2: 97% 99% 100%   Weight:      Height:       Body mass index is 22.46 kg/m.   Lab Data  CBC    Component Value Date/Time   WBC 11.8 (H) 08/25/2022 0252   RBC 2.66 (L) 08/25/2022 0252   HGB 10.1 (L) 08/25/2022 0252   HCT 27.8 (L) 08/25/2022 0252   PLT 269 08/25/2022 0252   MCV 104.5 (H) 08/25/2022 0252   MCH 38.0 (H) 08/25/2022 [id number]  MCHC 36.3 (H) 08/25/2022 0252   RDW 11.8 08/25/2022 0252   LYMPHSABS 1.1 08/26/2022 1525   MONOABS 1.8 (H) 08/12/2022 1525   EOSABS 0.0 08/23/2022 1525   BASOSABS 0.0 08/26/2022 1525      Latest Ref Rng & Units 08/25/2022    9:22 AM 08/25/2022    2:52 AM 08/25/2022    7:15 PM  BMP  Glucose 70 - 99 mg/dL  81  90   BUN 8 - 23 mg/dL  40  46   Creatinine 0.44 - 1.00 mg/dL  1.04  1.10   Sodium 135 - 145 mmol/L 124  120  119   Potassium 3.5 - 5.1 mmol/L  4.1  3.8   Chloride 98 - 111 mmol/L  86  87   CO2 22 - 32 mmol/L  15  14   Calcium 8.9 - 10.3 mg/dL  8.1  7.8      Micro Data   Results for orders placed or performed during the hospital encounter of 09/06/2022   Culture, blood (routine x 2)     Status: None (Preliminary result)   Collection Time: 08/19/2022  3:25 PM   Specimen: BLOOD  Result Value Ref Range Status   Specimen Description BLOOD SITE NOT SPECIFIED  Final   Special Requests   Final    BOTTLES DRAWN AEROBIC AND ANAEROBIC Blood Culture results may not be optimal due to an inadequate volume of blood received in culture bottles   Culture   Final    NO GROWTH < 24 HOURS Performed at [facility], [address], [place], [place] [address]    Report Status PENDING  Incomplete    Scheduled Meds:  folic acid  1 mg Oral Daily   heparin  5,000 Units Subcutaneous Q8H   multivitamin with minerals  1 tablet Oral Daily   sodium chloride flush  3 mL Intravenous Q12H   thiamine  100 mg Oral Daily   Or   thiamine  100 mg Intravenous Daily   Continuous Infusions: NS at 75 cc/hr.  Principal Problem:   Hyponatremia Active Problems:   Rhabdomyolysis   Alcoholic cirrhosis (HCC)   Elevated LFTs   AKI (acute kidney injury) ([place])   Hypothermia   Essential hypertension   Alcohol abuse   LOS: 1 day   A & P  Hyponatremia Appears to be hypovolemic in etiology. Improved to 124 since admission. Will recheck Na this afternoon and tonight. Continue NS supplementation.  Rhabdomyolysis Continue IV fluid supplementation, but cautious out of concern for repleting sodium too quickly.   Alcoholic cirrhosis ([place]) Noted. Monitor for signs of withdrawal.  Elevated LFTs Elevated slightly over LFT's at admission. Likely reaching a plateau. Likely primarily due to alcohol given the AST/ALT ratio, although elevated CK will also result in elevated LFT's.  AKI (acute kidney injury) ([place]) Baseline creatinine appears to be 0.75. Admission creatinine was elevated 1t 1.10. Improved to 1.04. Monitor creatinine, electrolytes and volume status.  Hypothermia 92 degrees rectal on admission. Now 98.4. Likely due to exposure as the patient was found down on  the floor in her home with the door open.  Essential hypertension The patient is currently normotensive without antihypertensives.  Alcohol abuse Noted. Monitor for signs of withdrawal.   DVT prophylaxis: heparin Code Status: Full Code Family Communication: None available Disposition Plan: to be determined.    Morton Simson, DO [facility] Direct contact: see [email or web address]  7PM-7AM contact night coverage as above 08/25/2022, 1:31 PM  LOS: 1 day

## 2022-08-26 ENCOUNTER — Inpatient Hospital Stay (HOSPITAL_COMMUNITY): Payer: Medicare Other

## 2022-08-26 DIAGNOSIS — F1010 Alcohol abuse, uncomplicated: Secondary | ICD-10-CM

## 2022-08-26 DIAGNOSIS — M6282 Rhabdomyolysis: Secondary | ICD-10-CM | POA: Diagnosis not present

## 2022-08-26 DIAGNOSIS — I10 Essential (primary) hypertension: Secondary | ICD-10-CM | POA: Diagnosis not present

## 2022-08-26 DIAGNOSIS — E871 Hypo-osmolality and hyponatremia: Secondary | ICD-10-CM | POA: Diagnosis not present

## 2022-08-26 LAB — BASIC METABOLIC PANEL
Anion gap: 15 (ref 5–15)
Anion gap: 18 — ABNORMAL HIGH (ref 5–15)
Anion gap: 15 (ref 5–15)
Anion gap: 16 — ABNORMAL HIGH (ref 5–15)
BUN: 24 mg/dL — ABNORMAL HIGH (ref 8–23)
BUN: 22 mg/dL (ref 8–23)
BUN: 21 mg/dL (ref 8–23)
BUN: 20 mg/dL (ref 8–23)
CO2: 18 mmol/L — ABNORMAL LOW (ref 22–32)
CO2: 16 mmol/L — ABNORMAL LOW (ref 22–32)
CO2: 17 mmol/L — ABNORMAL LOW (ref 22–32)
CO2: 17 mmol/L — ABNORMAL LOW (ref 22–32)
Calcium: 7.7 mg/dL — ABNORMAL LOW (ref 8.9–10.3)
Calcium: 7.8 mg/dL — ABNORMAL LOW (ref 8.9–10.3)
Calcium: 7.7 mg/dL — ABNORMAL LOW (ref 8.9–10.3)
Calcium: 7.7 mg/dL — ABNORMAL LOW (ref 8.9–10.3)
Chloride: 87 mmol/L — ABNORMAL LOW (ref 98–111)
Chloride: 90 mmol/L — ABNORMAL LOW (ref 98–111)
Chloride: 88 mmol/L — ABNORMAL LOW (ref 98–111)
Chloride: 88 mmol/L — ABNORMAL LOW (ref 98–111)
Creatinine, Ser: 0.81 mg/dL (ref 0.44–1.00)
Creatinine, Ser: 0.84 mg/dL (ref 0.44–1.00)
Creatinine, Ser: 0.9 mg/dL (ref 0.44–1.00)
Creatinine, Ser: 0.78 mg/dL (ref 0.44–1.00)
GFR, Estimated: >60 mL/min (ref >60)
GFR, Estimated: >60 mL/min (ref >60)
GFR, Estimated: >60 mL/min (ref >60)
GFR, Estimated: >60 mL/min (ref >60)
Glucose, Bld: 89 mg/dL (ref 70–99)
Glucose, Bld: 86 mg/dL (ref 70–99)
Glucose, Bld: 127 mg/dL — ABNORMAL HIGH (ref 70–99)
Glucose, Bld: 98 mg/dL (ref 70–99)
Potassium: 3.4 mmol/L — ABNORMAL LOW (ref 3.5–5.1)
Potassium: 3.6 mmol/L (ref 3.5–5.1)
Potassium: 3.4 mmol/L — ABNORMAL LOW (ref 3.5–5.1)
Potassium: 3.3 mmol/L — ABNORMAL LOW (ref 3.5–5.1)
Sodium: 120 mmol/L — ABNORMAL LOW (ref 135–145)
Sodium: 124 mmol/L — ABNORMAL LOW (ref 135–145)
Sodium: 120 mmol/L — ABNORMAL LOW (ref 135–145)
Sodium: 121 mmol/L — ABNORMAL LOW (ref 135–145)

## 2022-08-26 LAB — CBC WITH DIFFERENTIAL/PLATELET
Abs Immature Granulocytes: 0.09 10*3/uL — ABNORMAL HIGH (ref 0.00–0.07)
Basophils Absolute: 0 10*3/uL (ref 0.0–0.1)
Basophils Relative: 0 %
Eosinophils Absolute: 0 10*3/uL (ref 0.0–0.5)
Eosinophils Relative: 0 %
HCT: 29.3 % — ABNORMAL LOW (ref 36.0–46.0)
Hemoglobin: 10.5 g/dL — ABNORMAL LOW (ref 12.0–15.0)
Immature Granulocytes: 1 %
Lymphocytes Relative: 10 %
Lymphs Abs: 0.7 10*3/uL (ref 0.7–4.0)
MCH: 37.9 pg — ABNORMAL HIGH (ref 26.0–34.0)
MCHC: 35.8 g/dL (ref 30.0–36.0)
MCV: 105.8 fL — ABNORMAL HIGH (ref 80.0–100.0)
Monocytes Absolute: 1.2 10*3/uL — ABNORMAL HIGH (ref 0.1–1.0)
Monocytes Relative: 17 %
Neutro Abs: 5.1 10*3/uL (ref 1.7–7.7)
Neutrophils Relative %: 72 %
Platelets: 203 10*3/uL (ref 150–400)
RBC: 2.77 MIL/uL — ABNORMAL LOW (ref 3.87–5.11)
RDW: 11.9 % (ref 11.5–15.5)
WBC: 7.2 10*3/uL (ref 4.0–10.5)
nRBC: 0.3 % — ABNORMAL HIGH (ref 0.0–0.2)

## 2022-08-26 LAB — HEMOGLOBIN A1C
Hgb A1c MFr Bld: 4.3 % — ABNORMAL LOW (ref 4.8–5.6)
Mean Plasma Glucose: 76.71 mg/dL

## 2022-08-26 LAB — MRSA NEXT GEN BY PCR, NASAL: MRSA by PCR Next Gen: NOT DETECTED

## 2022-08-26 LAB — OSMOLALITY: Osmolality: 270 mOsm/kg — ABNORMAL LOW (ref 275–295)

## 2022-08-26 MED ORDER — MIDODRINE HCL 5 MG PO TABS: 5.0000 mg | ORAL_TABLET | Freq: Three times a day (TID) | ORAL | Status: DC

## 2022-08-26 MED ORDER — POTASSIUM CHLORIDE 20 MEQ PO PACK
40.0000 meq | PACK | Freq: Once | ORAL | Status: AC
  Administered 2022-08-26: 40 meq via ORAL
  Filled 2022-08-26: qty 2

## 2022-08-26 MED ORDER — ASPIRIN 300 MG RE SUPP: 300.0000 mg | Freq: Every day | RECTAL | Status: DC

## 2022-08-26 MED ORDER — FUROSEMIDE 10 MG/ML IJ SOLN
20.0000 mg | Freq: Once | INTRAMUSCULAR | Status: AC
  Administered 2022-08-26: 20 mg via INTRAVENOUS
  Filled 2022-08-26: qty 2

## 2022-08-26 MED ORDER — SODIUM CHLORIDE 0.9 % IV SOLN: INTRAVENOUS | Status: DC

## 2022-08-26 MED ORDER — MIDODRINE HCL 5 MG PO TABS
5.0000 mg | ORAL_TABLET | Freq: Three times a day (TID) | ORAL | Status: DC
  Administered 2022-08-26: 5 mg via ORAL
  Filled 2022-08-26 (×2): qty 1

## 2022-08-26 MED ORDER — STROKE: EARLY STAGES OF RECOVERY BOOK
Freq: Once | Status: DC
  Filled 2022-08-26: qty 1

## 2022-08-26 MED ORDER — ALBUMIN HUMAN 25 % IV SOLN
50.0000 g | Freq: Once | INTRAVENOUS | Status: AC
  Administered 2022-08-26: 50 g via INTRAVENOUS
  Filled 2022-08-26: qty 200

## 2022-08-26 MED ORDER — ASPIRIN 325 MG PO TABS
325.0000 mg | ORAL_TABLET | Freq: Every day | ORAL | Status: DC
  Administered 2022-08-26: 325 mg via ORAL
  Filled 2022-08-26: qty 1

## 2022-08-26 NOTE — Consult Note (Signed)
Neurology Consultation  CC: altered mental status  History is obtained from:patient and chart.   HPI: [name] is a 67 y.o. female with PMH significant for alcohol-associated liver cirrhosis, HTN who came to the ED after being found down by [employer] during a wellness check. Patient's PCP requested the wellness check after not being able to contact her. [employer] arrived to her house and her door was wide open.  She was found laying on the floor, cold to touch, with mottled extremities.  She was brought to the ED for further evaluation. Patient was admitted with severe hyponatremia and rhabdomyolysis.  Neurology was consulted today due to patient's continued altered mental status and left lean noted by physical therapy. On admission, patient was hypothermic, hyponatremic, Transaminitis (significantly higher than baseline), CK of 7591, LA of 4.1, WBC of 13.1, PTT of 16.6 with INR of 1.4. Negative UA, blood cultures negative preliminary.  LA improved to 1.9. Today, Na: 120, WBC 7.2.  On assessment, routine EEG was completing at bedside.  Patient lethargic but responded to name was oriented to self place month and year, follow commands.    ROS: Unable to obtain due to altered mental status.   Past Medical History:  Diagnosis Date   Anxiety    Cancer ([place]) 04/2014   squamous cell carcinoma L inner thigh   Depression    Hard of hearing    Hypertension    Osteopenia    Substance abuse ([place])    TIA (transient ischemic attack) 2016     Family History  Problem Relation Age of Onset   Hypertension Mother    Kidney disease Mother        dialysis   Alzheimer's disease Mother    Hyperlipidemia Father    Stroke Paternal Grandmother    Rheum arthritis Paternal Grandmother    Breast cancer Neg Hx    Colon cancer Neg Hx    Esophageal cancer Neg Hx    Rectal cancer Neg Hx    Stomach cancer Neg Hx    Social History:  reports that she has never smoked. She has never been exposed to tobacco smoke.  She has never used smokeless tobacco. She reports that she does not currently use alcohol after a past usage of about 4.0 - 6.0 standard drinks of alcohol per week. She reports that she does not use drugs.  Prior to Admission medications   Medication Sig Start Date End Date Taking? Authorizing Provider  lisinopril (ZESTRIL) 40 MG tablet TAKE 1 TABLET BY MOUTH EVERY DAY 07/28/22  Yes Lowne Chase, Yvonne R, DO  melatonin 5 MG TABS Take 1 tablet (5 mg total) by mouth at bedtime as needed. 04/08/21  Yes Antonieta Pert, MD  metoprolol succinate (TOPROL-XL) 100 MG 24 hr tablet Take 1 tablet (100 mg total) by mouth daily. Take with or immediately following a meal 06/26/22  Yes Roma Schanz R, DO    Current Facility-Administered Medications:    [START ON 09/14/22]  stroke: early stages of recovery book, , Does not apply, Once, Ghimire, Henreitta Leber, MD   0.9 %  sodium chloride infusion, , Intravenous, Continuous, Ghimire, Henreitta Leber, MD, Last Rate: 75 mL/hr at 08/26/22 1227, New Bag at 08/26/22 1227   aspirin suppository 300 mg, 300 mg, Rectal, Daily **OR** aspirin tablet 325 mg, 325 mg, Oral, Daily, Ghimire, Shanker M, MD, 325 mg at [id number]   folic acid (FOLVITE) tablet 1 mg, 1 mg, Oral, Daily, Lenore Cordia, MD, 1  mg at 08/26/22 0914   heparin injection 5,000 Units, 5,000 Units, Subcutaneous, Q8H, Lenore Cordia, MD, 5,000 Units at 08/26/22 1404   LORazepam (ATIVAN) tablet 1-4 mg, 1-4 mg, Oral, Q1H PRN, Lenore Cordia, MD, 1 mg at 08/25/22 2342   multivitamin with minerals tablet 1 tablet, 1 tablet, Oral, Daily, Lenore Cordia, MD, 1 tablet at 08/26/22 0914   ondansetron (ZOFRAN) tablet 4 mg, 4 mg, Oral, Q6H PRN **OR** ondansetron (ZOFRAN) injection 4 mg, 4 mg, Intravenous, Q6H PRN, Posey Pronto, Vishal R, MD   senna-docusate (Senokot-S) tablet 1 tablet, 1 tablet, Oral, QHS PRN, Posey Pronto, Vishal R, MD   sodium chloride flush (NS) 0.9 % injection 3 mL, 3 mL, Intravenous, Q12H, Zada Finders R, MD, 3 mL at  08/26/22 0914   thiamine (VITAMIN B1) tablet 100 mg, 100 mg, Oral, Daily, 100 mg at 08/26/22 0914 **OR** thiamine (VITAMIN B1) injection 100 mg, 100 mg, Intravenous, Daily, Lenore Cordia, MD   Exam: Current vital signs: BP 116/72 (BP Location: Left Arm)   Pulse (!) 106   Temp 97.9 F (36.6 C) (Oral)   Resp 20   Ht 5' (1.524 m)   Wt 48.8 kg   SpO2 92%   BMI 21.01 kg/m    Physical Exam  Constitutional: Appears poorly nourished. Psych: Affect appropriate to situation Eyes: No scleral injection HENT: No OP obstrucion Head: Normocephalic.  Cardiovascular: Sinus tachycardia on monitor.  Respiratory: 5 L nasal cannula supplemental oxygen needed, rhonchi  GI: Soft.  No distension. There is no tenderness.  Skin: WDI  Neuro: Mental Status: Patient is lethargic. Oriented to person, place, month, year, not to situation.Patient did not remember getting the MRI today or how she got into the hospital. Follows simple commands, often asking why she was being asked to complete the tasks.   Cranial Nerves: II: Visual Fields are full. Pupils are equal, round, and reactive to light.  2brisk.  III,IV, VI: EOMI with nystagmus present.  V: Facial sensation is symmetric to temperature VII: Facial movement is symmetric.  VIII: hearing is intact to voice X: Uvula elevates symmetrically XI: Shoulder shrug is symmetric. XII: tongue is midline without atrophy or fasciculations.  Motor: Tone is normal. Bulk is normal. 5/5 strength was present in all four extremities.  Sensory: Sensation is symmetric to light touch in the arms and legs.  Cerebellar: Ataxia present with FNF exam.   I have reviewed labs in epic and the pertinent results are:  Lab Results  Component Value Date/Time   CHOL 280 (H) 07/28/2022 10:55 AM    Results for orders placed or performed during the hospital encounter of 08/28/2022 (from the past 48 hour(s))  Lactic acid, plasma     Status: Abnormal   Collection Time:  09/05/2022  4:58 PM  Result Value Ref Range   Lactic Acid, Venous 7.2 (HH) 0.5 - 1.9 mmol/L    Comment: CRITICAL VALUE NOTED. VALUE IS CONSISTENT WITH PREVIOUSLY REPORTED/CALLED VALUE Performed at [facility], [place] [address], [place],  [address]   I-Stat venous blood gas, [facility] ED, [facility], [facility])     Status: Abnormal   Collection Time: 08/26/2022  6:07 PM  Result Value Ref Range   pH, Ven 7.346 7.25 - 7.43   pCO2, Ven 21.7 (L) 44 - 60 mmHg   pO2, Ven 38 32 - 45 mmHg   Bicarbonate 11.9 (L) 20.0 - 28.0 mmol/L   TCO2 13 (L) 22 - 32 mmol/L   O2 Saturation 71 %  Acid-base deficit 12.0 (H) 0.0 - 2.0 mmol/L   Sodium 123 (L) 135 - 145 mmol/L   Potassium 3.2 (L) 3.5 - 5.1 mmol/L   Calcium, Ion 0.90 (L) 1.15 - 1.40 mmol/L   HCT 24.0 (L) 36.0 - 46.0 %   Hemoglobin 8.2 (L) 12.0 - 15.0 g/dL   Sample type VENOUS    Comment NOTIFIED PHYSICIAN   Urinalysis, w/ Reflex to Culture (Infection Suspected) -Urine, Unspecified Source     Status: Abnormal   Collection Time: 08/14/2022  7:15 PM  Result Value Ref Range   Specimen Source URINE, UNSPE    Color, Urine YELLOW YELLOW   APPearance CLEAR CLEAR   Specific Gravity, Urine 1.011 1.005 - 1.030   pH 5.0 5.0 - 8.0   Glucose, UA NEGATIVE NEGATIVE mg/dL   Hgb urine dipstick MODERATE (A) NEGATIVE   Bilirubin Urine NEGATIVE NEGATIVE   Ketones, ur 5 (A) NEGATIVE mg/dL   Protein, ur NEGATIVE NEGATIVE mg/dL   Nitrite NEGATIVE NEGATIVE   Leukocytes,Ua NEGATIVE NEGATIVE   RBC / HPF 0-5 0 - 5 RBC/hpf   WBC, UA 0-5 0 - 5 WBC/hpf    Comment:        Reflex urine culture not performed if WBC <=10, OR if Squamous epithelial cells >5. If Squamous epithelial cells >5 suggest recollection.    Bacteria, UA NONE SEEN NONE SEEN   Squamous Epithelial / HPF 0-5 0 - 5 /HPF    Comment: Performed at [facility], [place] [address], [place], [place] [address]  Lactic acid, plasma     Status: Abnormal   Collection Time: 08/21/2022  7:15 PM  Result Value Ref Range    Lactic Acid, Venous 2.5 (HH) 0.5 - 1.9 mmol/L    Comment: CRITICAL VALUE NOTED. VALUE IS CONSISTENT WITH PREVIOUSLY REPORTED/CALLED VALUE Performed at [facility], [place] [address] [place], [place] [address]   Comprehensive metabolic panel     Status: Abnormal   Collection Time: 08/23/2022  7:15 PM  Result Value Ref Range   Sodium 119 (LL) 135 - 145 mmol/L    Comment: CRITICAL RESULT CALLED TO, READ BACK BY AND VERIFIED WITH Ledell Peoples, RN @ 2017 09/01/2022 BY SEKDAHL   Potassium 3.8 3.5 - 5.1 mmol/L   Chloride 87 (L) 98 - 111 mmol/L   CO2 14 (L) 22 - 32 mmol/L   Glucose, Bld 90 70 - 99 mg/dL    Comment: Glucose reference range applies only to samples taken after fasting for at least 8 hours.   BUN 46 (H) 8 - 23 mg/dL   Creatinine, Ser 1.10 (H) 0.44 - 1.00 mg/dL   Calcium 7.8 (L) 8.9 - 10.3 mg/dL   Total Protein 5.0 (L) 6.5 - 8.1 g/dL   Albumin 3.1 (L) 3.5 - 5.0 g/dL   AST 370 (H) 15 - 41 U/L   ALT 116 (H) 0 - 44 U/L   Alkaline Phosphatase 52 38 - 126 U/L   Total Bilirubin 1.5 (H) 0.3 - 1.2 mg/dL   GFR, Estimated 56 (L) >60 mL/min    Comment: (NOTE) Calculated using the CKD-EPI Creatinine Equation (2021)    Anion gap 18 (H) 5 - 15    Comment: Performed at [facility], [place] [address], [place], [place] [address]  Lactic acid, plasma     Status: None   Collection Time: 09/03/2022  9:49 PM  Result Value Ref Range   Lactic Acid, Venous 1.9 0.5 - 1.9 mmol/L  Comment: Performed at [facility], [place] [address] [place], [place] [address]  Osmolality     Status: Abnormal   Collection Time: 08/25/22  2:51 AM  Result Value Ref Range   Osmolality 270 (L) 275 - 295 mOsm/kg    Comment: Performed at [facility], [place] [address], [place], [place] [address]  Osmolality     Status: Abnormal   Collection Time: 08/25/22  2:52 AM  Result Value Ref Range   Osmolality 270 (L) 275 - 295 mOsm/kg    Comment: Performed at [facility], [place] [address], [place],  [place] [address]  HIV Antibody (routine testing w rflx)     Status: None   Collection Time: 08/25/22  2:52 AM  Result Value Ref Range   HIV Screen 4th Generation wRfx Non Reactive Non Reactive    Comment: Performed at [facility], [place] [address], [place], [place] [address]  CBC     Status: Abnormal   Collection Time: 08/25/22  2:52 AM  Result Value Ref Range   WBC 11.8 (H) 4.0 - 10.5 K/uL   RBC 2.66 (L) 3.87 - 5.11 MIL/uL   Hemoglobin 10.1 (L) 12.0 - 15.0 g/dL   HCT 27.8 (L) 36.0 - 46.0 %   MCV 104.5 (H) 80.0 - 100.0 fL   MCH 38.0 (H) 26.0 - 34.0 pg   MCHC 36.3 (H) 30.0 - 36.0 g/dL    Comment: CORRECTED FOR COLD AGGLUTININS   RDW 11.8 11.5 - 15.5 %   Platelets 269 150 - 400 K/uL   nRBC 1.1 (H) 0.0 - 0.2 %    Comment: Performed at [facility], [place] [address], [place], [place] [address]  Protime-INR     Status: Abnormal   Collection Time: 08/25/22  2:52 AM  Result Value Ref Range   Prothrombin Time 16.2 (H) 11.4 - 15.2 seconds   INR 1.3 (H) 0.8 - 1.2    Comment: (NOTE) INR goal varies based on device and disease states. Performed at [facility], [place] [address] [place], [place] [address]   CK     Status: Abnormal   Collection Time: 08/25/22  2:52 AM  Result Value Ref Range   Total CK 6,090 (H) 38 - 234 U/L    Comment: RESULT CONFIRMED BY MANUAL DILUTION Performed at [facility], [place] [address], [place], [place] [address]   Comprehensive metabolic panel     Status: Abnormal   Collection Time: 08/25/22  2:52 AM  Result Value Ref Range   Sodium 120 (L) 135 - 145 mmol/L   Potassium 4.1 3.5 - 5.1 mmol/L   Chloride 86 (L) 98 - 111 mmol/L   CO2 15 (L) 22 - 32 mmol/L   Glucose, Bld 81 70 - 99 mg/dL    Comment: Glucose reference range applies only to samples taken after fasting for at least 8 hours.   BUN 40 (H) 8 - 23 mg/dL   Creatinine, Ser 1.04 (H) 0.44 - 1.00 mg/dL   Calcium 8.1 (L) 8.9 - 10.3 mg/dL   Total Protein 5.2 (L) 6.5 - 8.1 g/dL   Albumin 3.3  (L) 3.5 - 5.0 g/dL   AST 386 (H) 15 - 41 U/L   ALT 118 (H) 0 - 44 U/L   Alkaline Phosphatase 56 38 - 126 U/L   Total Bilirubin 1.7 (H) 0.3 - 1.2 mg/dL   GFR, Estimated 60 (L) >60 mL/min  Comment: (NOTE) Calculated using the CKD-EPI Creatinine Equation (2021)    Anion gap 19 (H) 5 - 15    Comment: Performed at [facility], [place] [address], [place], [place] [address]  Sodium     Status: Abnormal   Collection Time: 08/25/22  9:22 AM  Result Value Ref Range   Sodium 124 (L) 135 - 145 mmol/L    Comment: Performed at [facility] [address], [place], [place] [id number]  Basic metabolic panel     Status: Abnormal   Collection Time: 08/25/22  5:11 PM  Result Value Ref Range   Sodium 120 (L) 135 - 145 mmol/L   Potassium 3.6 3.5 - 5.1 mmol/L   Chloride 89 (L) 98 - 111 mmol/L   CO2 17 (L) 22 - 32 mmol/L   Glucose, Bld 85 70 - 99 mg/dL    Comment: Glucose reference range applies only to samples taken after fasting for at least 8 hours.   BUN 32 (H) 8 - 23 mg/dL   Creatinine, Ser 0.94 0.44 - 1.00 mg/dL   Calcium 7.8 (L) 8.9 - 10.3 mg/dL   GFR, Estimated >60 >60 mL/min    Comment: (NOTE) Calculated using the CKD-EPI Creatinine Equation (2021)    Anion gap 14 5 - 15    Comment: Performed at [facility] [address] [place], [place] [id number]  Basic metabolic panel     Status: Abnormal   Collection Time: 08/26/22 12:48 AM  Result Value Ref Range   Sodium 120 (L) 135 - 145 mmol/L   Potassium 3.4 (L) 3.5 - 5.1 mmol/L   Chloride 87 (L) 98 - 111 mmol/L   CO2 18 (L) 22 - 32 mmol/L   Glucose, Bld 89 70 - 99 mg/dL    Comment: Glucose reference range applies only to samples taken after fasting for at least 8 hours.   BUN 24 (H) 8 - 23 mg/dL   Creatinine, Ser 0.81 0.44 - 1.00 mg/dL   Calcium 7.7 (L) 8.9 - 10.3 mg/dL   GFR, Estimated >60 >60 mL/min    Comment: (NOTE) Calculated using the CKD-EPI Creatinine Equation (2021)    Anion gap 15 5 - 15    Comment:  Performed at [facility] [address], [place], [place] [id number]  Basic metabolic panel     Status: Abnormal   Collection Time: 08/26/22  4:44 AM  Result Value Ref Range   Sodium 124 (L) 135 - 145 mmol/L   Potassium 3.6 3.5 - 5.1 mmol/L   Chloride 90 (L) 98 - 111 mmol/L   CO2 16 (L) 22 - 32 mmol/L   Glucose, Bld 86 70 - 99 mg/dL    Comment: Glucose reference range applies only to samples taken after fasting for at least 8 hours.   BUN 22 8 - 23 mg/dL   Creatinine, Ser 0.84 0.44 - 1.00 mg/dL   Calcium 7.8 (L) 8.9 - 10.3 mg/dL   GFR, Estimated >60 >60 mL/min    Comment: (NOTE) Calculated using the CKD-EPI Creatinine Equation (2021)    Anion gap 18 (H) 5 - 15    Comment: Performed at [facility] [address], [place], [place] [address]  CBC with Differential/Platelet     Status: Abnormal   Collection Time: 08/26/22  4:44 AM  Result Value Ref Range   WBC 7.2 4.0 - 10.5 K/uL   RBC 2.77 (L) 3.87 - 5.11 MIL/uL  Hemoglobin 10.5 (L) 12.0 - 15.0 g/dL   HCT 29.3 (L) 36.0 - 46.0 %   MCV 105.8 (H) 80.0 - 100.0 fL   MCH 37.9 (H) 26.0 - 34.0 pg   MCHC 35.8 30.0 - 36.0 g/dL   RDW 11.9 11.5 - 15.5 %   Platelets 203 150 - 400 K/uL   nRBC 0.3 (H) 0.0 - 0.2 %   Neutrophils Relative % 72 %   Neutro Abs 5.1 1.7 - 7.7 K/uL   Lymphocytes Relative 10 %   Lymphs Abs 0.7 0.7 - 4.0 K/uL   Monocytes Relative 17 %   Monocytes Absolute 1.2 (H) 0.1 - 1.0 K/uL   Eosinophils Relative 0 %   Eosinophils Absolute 0.0 0.0 - 0.5 K/uL   Basophils Relative 0 %   Basophils Absolute 0.0 0.0 - 0.1 K/uL   Immature Granulocytes 1 %   Abs Immature Granulocytes 0.09 (H) 0.00 - 0.07 K/uL    Comment: Performed at [facility], [address], [place], [place] [address]  MRSA Next Gen by PCR, Nasal     Status: None   Collection Time: 08/26/22  5:46 AM   Specimen: Nasal Mucosa; Nasal Swab  Result Value Ref Range   MRSA by PCR Next Gen NOT DETECTED NOT DETECTED    Comment: (NOTE) The  GeneXpert MRSA Assay (FDA approved for NASAL specimens only), is one component of a comprehensive MRSA colonization surveillance program. It is not intended to diagnose MRSA infection nor to guide or monitor treatment for MRSA infections. Test performance is not FDA approved in patients less than 80 years old. Performed at [facility], [place] [address] [place], [place] [id number]   Basic metabolic panel     Status: Abnormal   Collection Time: 08/26/22 10:48 AM  Result Value Ref Range   Sodium 120 (L) 135 - 145 mmol/L   Potassium 3.4 (L) 3.5 - 5.1 mmol/L   Chloride 88 (L) 98 - 111 mmol/L   CO2 17 (L) 22 - 32 mmol/L   Glucose, Bld 127 (H) 70 - 99 mg/dL    Comment: Glucose reference range applies only to samples taken after fasting for at least 8 hours.   BUN 21 8 - 23 mg/dL   Creatinine, Ser 0.90 0.44 - 1.00 mg/dL   Calcium 7.7 (L) 8.9 - 10.3 mg/dL   GFR, Estimated >60 >60 mL/min    Comment: (NOTE) Calculated using the CKD-EPI Creatinine Equation (2021)    Anion gap 15 5 - 15    Comment: Performed at [facility] [address] [place], [place] [address]    MR BRAIN WO CONTRAST  Result Date: 08/26/2022 CLINICAL DATA:  Provided history: Mental status change, unknown cause. EXAM: MRI HEAD WITHOUT CONTRAST TECHNIQUE: Multiplanar, multiecho pulse sequences of the brain and surrounding structures were obtained without intravenous contrast. COMPARISON:  Head CT 08/15/2022.  Brain MRI 03/23/2021. FINDINGS: Brain: The patient was unable to tolerate the full examination. As result, only axial and coronal diffusion-weighted sequences, and an axial T2 FLAIR sequence could be obtained. The axial T2 FLAIR sequence is severely motion degraded. Moderate to advanced cerebral atrophy with prominence of the ventricles and sulci. 7 mm acute infarct within the right thalamus. Incompletely assessed chronic small vessel ischemic changes within cerebral white matter, which appears at least  moderate. Vascular: Flow voids are poorly assessed in the absence of an axial or coronal T2-weighted sequence. Skull and upper cervical spine: No focal suspicious marrow  signal abnormality on the acquired sequences. Sinuses/Orbits: The orbits and paranasal sinuses are poorly assessed due to absence of multiple sequences and due to motion degradation. Impression #2 will be called to the ordering clinician or representative by the Radiologist Assistant, and communication documented in the PACS or [employer]. IMPRESSION: The patient could not tolerate the full examination. Only axial and coronal diffusion-weighted imaging, and an axial T2 FLAIR sequence could be obtained. The axial T2 FLAIR sequence is severely motion degraded. 7 mm acute infarct within the right thalamus. Parenchymal atrophy and chronic small vessel ischemic disease, as described. Electronically Signed   By: Kellie Simmering D.O.   On: 08/26/2022 14:08   US Abdomen Limited RUQ (LIVER/GB)  Result Date: 08/22/2022 CLINICAL DATA:  Elevated LFT EXAM: ULTRASOUND ABDOMEN LIMITED RIGHT UPPER QUADRANT COMPARISON:  MRI 08/01/2022 FINDINGS: Gallbladder: No gallstones or wall thickening visualized. No sonographic Murphy sign noted by sonographer. Common bile duct: Diameter: 7.6 mm Liver: Echogenic liver parenchyma. No focal hepatic abnormality. Portal vein is patent on color Doppler imaging with normal direction of blood flow towards the liver. Other: Small volume perihepatic ascites IMPRESSION: 1. Echogenic liver parenchyma consistent with hepatic steatosis and or hepatocellular disease. 2. Upper normal to slightly enlarged common bile duct but stable to slightly decreased compared to MRI from January Electronically Signed   By: Donavan Foil M.D.   On: 08/25/2022 23:20   CT Head Wo Contrast  Result Date: 09/03/2022 CLINICAL DATA:  Mental status change EXAM: CT HEAD WITHOUT CONTRAST TECHNIQUE: Contiguous axial images were obtained from the base of the  skull through the vertex without intravenous contrast. RADIATION DOSE REDUCTION: This exam was performed according to the departmental dose-optimization program which includes automated exposure control, adjustment of the mA and/or kV according to patient size and/or use of iterative reconstruction technique. COMPARISON:  Head CT 02/21/2021 FINDINGS: Brain: No evidence of acute infarction, hemorrhage, hydrocephalus, extra-axial collection or mass lesion/mass effect. Again seen is moderate diffuse atrophy with compensatory dilatation of the ventricular system. This is unchanged from prior. There is stable mild periventricular white matter hypodensity, likely chronic small vessel ischemic change. Vascular: Atherosclerotic calcifications are present within the cavernous internal carotid arteries. Skull: Normal. Negative for fracture or focal lesion. Sinuses/Orbits: No acute finding. Other: None. IMPRESSION: 1. No acute intracranial process. 2. Stable moderate diffuse atrophy with compensatory dilatation of the ventricular system. 3. Stable mild periventricular white matter hypodensity, likely chronic small vessel ischemic change. Electronically Signed   By: Ronney Asters M.D.   On: 08/21/2022 18:52   CT Cervical Spine Wo Contrast  Result Date: 09/01/2022 CLINICAL DATA:  Neck trauma.  Patient was found on the floor. EXAM: CT CERVICAL SPINE WITHOUT CONTRAST TECHNIQUE: Multidetector CT imaging of the cervical spine was performed without intravenous contrast. Multiplanar CT image reconstructions were also generated. RADIATION DOSE REDUCTION: This exam was performed according to the departmental dose-optimization program which includes automated exposure control, adjustment of the mA and/or kV according to patient size and/or use of iterative reconstruction technique. COMPARISON:  None Available. FINDINGS: Alignment: Normal alignment. Skull base and vertebrae: Skull base appears intact. No vertebral compression  deformities. No focal bone lesion or bone destruction. Soft tissues and spinal canal: No prevertebral soft tissue swelling. No abnormal paraspinal soft tissue mass or infiltration. Disc levels: Degenerative changes throughout with disc space narrowing and endplate osteophyte formation. Degenerative changes in the posterior facet joints. Uncovertebral spurring and facet joint hypertrophy cause bone encroachment on multiple neural foramina bilaterally. Upper chest:  Moderate bilateral pleural effusions are demonstrated. Other: Degenerative changes in the right shoulder with heterotopic ossification. IMPRESSION: Normal alignment. Severe diffuse degenerative changes. No acute displaced fractures are identified. Electronically Signed   By: Lucienne Capers M.D.   On: 08/13/2022 18:48     I have reviewed the images obtained:  CT Head 2/15: No acute process.  Stable moderate diffuse atrophy.  Stable white matter hypodensity, likely chronic small vessel ischemic change.  MRI Brain 2/17: 7 mm acute right thalamic infarct.  Parenchymal atrophy and chronic small vessel ischemic disease  Primary Diagnosis:  Acute Right Thalamic Ischemic Stroke likely secondary to chronic small vessel disease  Secondary Diagnosis: Essential (primary) hypertension Acute kidney injury Hyponatremia Rhabdo myelolysis Alcoholic cirrhosis Alcohol abuse Hypothermia on admission  Impression:  [name] is a 67 y.o. female with PMH significant for alcohol-associated liver cirrhosis, HTN who came to the ED after being found down by [employer] during a wellness check.  Her imaging was positive for acute infarct.  Recommend stroke workup. With confusion and ataxia seen, probable Warnicke's encephalopathy.  Recommend changing to high-dose thiamine.  Recommendations: - Increase Thiamine to 538m TID  - Frequent Neuro checks per stroke unit protocol - Vascular imaging - CT Angio - TTE (ordered) - Lipid panel - Statin: Lisinopril  474mat home. Consider increasing/changing to another statin if LDL is >70.  - A1C: Completed, 4.3 - Antithrombotic - recommend changing Aspirin 32522mo Aspirin 74m33md Plavix x 3 weeks, then aspirin alone.  - DVT ppx - ok to continue Heparin SQ - SBP goal - <220, PRN labetalol if HR>60 and PRN Hydralazine if HR<60 - Telemetry monitoring for arrhythmia - 72h - Possible loop recorder on discharge  - Swallow screen - will be performed prior to PO intake - Stroke education - will be given - PT/OT/SLP - Alcohol Abuse Cessation Education - Continue CIWA   Stroke team will follow.    Pt seen by Neuro NP/APP and later by MD. Note/plan to be edited by MD as needed.    ErinOtelia SanteeP, AGACNP-BC [facility] Please use AMION for pager and EPIC for messaging   NEUROHOSPITALIST ADDENDUM Performed a face to face diagnostic evaluation.   I have reviewed the contents of history and physical exam as documented by PA/ARNP/Resident and agree with above documentation.  I have discussed and formulated the above plan as documented. Edits to the note have been made as needed.  Impression/Key exam findings/Plan: appears lethargic and exhausted but able to follow commands with encouragement. Hand grip is weaker on the left than right but able to still squueze with her left hand. Wiggles toes to commands moves ankles but would not lift her legs for me, suspect this is likely due to exhaustion and lethargy.  Aspirin and plavix on hold 2/2 bleeding from mouth. Stroke workup as above.   SalmDonnetta Simpers [facility] [id number] 7pm to 7am, please call on call as listed on AMION.

## 2022-08-26 NOTE — Progress Notes (Signed)
EEG complete - results pending 

## 2022-08-26 NOTE — Progress Notes (Signed)
MB arrived to room for EEG and Pt was rolling out the door headed to MRI. Nurse will inform us when she is back in room or maybe check back within an hour if time permits.

## 2022-08-26 NOTE — Progress Notes (Signed)
   08/26/22 0012  Assess: MEWS Score  Temp 97.8 F (36.6 C)  BP (!) 143/112  MAP (mmHg) 124  Pulse Rate (!) 102  ECG Heart Rate (!) 102  Resp (!) 22  Level of Consciousness Alert  SpO2 99 %  O2 Device Room Air  Assess: if the MEWS score is Yellow or Red  Were vital signs taken at a resting state? Yes  Focused Assessment No change from prior assessment  Does the patient meet 2 or more of the SIRS criteria? No  MEWS guidelines implemented  Yes, yellow  Treat  MEWS Interventions Considered administering scheduled or prn medications/treatments as ordered  Take Vital Signs  Increase Vital Sign Frequency  Yellow: Q2hr x1, continue Q4hrs until patient remains green for 12hrs  Escalate  MEWS: Escalate Yellow: Discuss with charge nurse and consider notifying provider and/or RRT  Notify: Charge Nurse/RN  Name of Charge Nurse/RN Notified Janie  Assess: SIRS CRITERIA  SIRS Temperature  0  SIRS Pulse 1  SIRS Respirations  1  SIRS WBC 0  SIRS Score Sum  2   Will continue to monitor the patient's progress.

## 2022-08-26 NOTE — Progress Notes (Deleted)
Patient had a cough and small amount of coffee ground vomiting. Spo2 dropped down to 80s Informed RT and RT put her on 8 L on HFNC salter. Spo2 maintained at 93% Notified MD. Chest Xray ordered.

## 2022-08-26 NOTE — Evaluation (Signed)
Physical Therapy Evaluation Patient Details Name: [name] MRN: [id number] DOB: April 05, 1956 Today's Date: 08/26/2022  History of Present Illness  IVA BJORKMAN is a 67 y.o. female with medical history significant for alcohol associated liver cirrhosis, pancreatic cysts, HTN who admitted after she was found down at home after a wellness check.  Positive for rhabdomyolysis and hyponatremia and hypothermic on admission.  Clinical Impression  Patient presents with decreased mobility due to deficits listed in PT problem list.  Currently max A for EOB and sit to stand with RW and great difficulty taking steps along side of bed.  She was living alone in two level condo and reports falls at home.  She will need STSNF level rehab at d/c.  PT will continue to follow in acute setting.      Recommendations for follow up therapy are one component of a multi-disciplinary discharge planning process, led by the attending physician.  Recommendations may be updated based on patient status, additional functional criteria and insurance authorization.  Follow Up Recommendations Skilled nursing-short term rehab (<3 hours/day) Can patient physically be transported by private vehicle: No    Assistance Recommended at Discharge Frequent or constant Supervision/Assistance  Patient can return home with the following  Two people to help with walking and/or transfers;Assistance with cooking/housework;A lot of help with bathing/dressing/bathroom;Help with stairs or ramp for entrance;Direct supervision/assist for medications management;Assist for transportation    Equipment Recommendations None recommended by PT  Recommendations for Other Services       Functional Status Assessment Patient has had a recent decline in their functional status and demonstrates the ability to make significant improvements in function in a reasonable and predictable amount of time.     Precautions / Restrictions  Precautions Precautions: Fall Precaution Comments: CIWA protocol      Mobility  Bed Mobility Overal bed mobility: Needs Assistance Bed Mobility: Rolling, Sidelying to Sit, Sit to Supine Rolling: Max assist Sidelying to sit: Mod assist, HOB elevated   Sit to supine: Max assist   General bed mobility comments: cues and increased time to initiate rolling, assist for legs off bed and to lift trunk to sit, assist for legs and trunk to supine    Transfers Overall transfer level: Needs assistance Equipment used: Rolling walker (2 wheels) Transfers: Sit to/from Stand Sit to Stand: Max assist           General transfer comment: lifting help to stand and increased time with L lateral lean and forward flexion    Ambulation/Gait Ambulation/Gait assistance: Max assist Gait Distance (Feet): 1 Feet Assistive device: Rolling walker (2 wheels) Gait Pattern/deviations: Step-to pattern, Wide base of support, Trunk flexed       General Gait Details: flexed posture, attmepting side steps toward HOB, but pt with great difficulty moving feet despite cues and mod to max A for balance and lateral weight shift took 2 small side steps  Stairs            Wheelchair Mobility    Modified Rankin (Stroke Patients Only) Modified Rankin (Stroke Patients Only) Pre-Morbid Rankin Score: Slight disability Modified Rankin: Severe disability     Balance Overall balance assessment: Needs assistance Sitting-balance support: Bilateral upper extremity supported Sitting balance-Leahy Scale: Poor Sitting balance - Comments: mod support in sitting initially due to L lateral lean, eventually able to sit briefly with S but imbalance obvious with minguard for safety   Standing balance support: Bilateral upper extremity supported Standing balance-Leahy Scale: Zero Standing balance comment: UE  support and mod to max A for balance in standing with flexed posture and L lateral lean                              Pertinent Vitals/Pain Pain Assessment Pain Assessment: Faces Faces Pain Scale: Hurts even more Pain Location: generalized due to falls Pain Descriptors / Indicators: Discomfort, Aching Pain Intervention(s): Monitored during session, Repositioned, Limited activity within patient's tolerance    Home Living Family/patient expects to be discharged to:: Private residence Living Arrangements: Alone Available Help at Discharge: Available PRN/intermittently;Friend(s) Type of Home: Other(Comment) (condo) Home Access: Stairs to enter Entrance Stairs-Rails: Right;Left Entrance Stairs-Number of Steps: 4 Alternate Level Stairs-Number of Steps: flight Home Layout: Two level;Bed/bath upstairs Home Equipment: Conservation officer, nature (2 wheels);Cane - single point Additional Comments: states gets groceries delivered and has friends to take to medical appointments, sponge bathes    Prior Function Prior Level of Function : History of Falls (last six months)             Mobility Comments: reports multiple falls, can usually get up on her own, states she trips       Hand Dominance   Dominant Hand: Left    Extremity/Trunk Assessment   Upper Extremity Assessment Upper Extremity Assessment: RUE deficits/detail;LUE deficits/detail RUE Deficits / Details: limited shoulder elevation and painful coupled with internal rotation; noted hands with limited grip L worse than R RUE Coordination: decreased gross motor;decreased fine motor LUE Deficits / Details: AROM shoulder WFL, decreased grip and pt holding hand in supination and flexion with great difficulty holding utensils or food or cups LUE Coordination: decreased gross motor;decreased fine motor         Cervical / Trunk Assessment Cervical / Trunk Assessment: Kyphotic;Other exceptions Cervical / Trunk Exceptions: forward head  Communication   Communication: HOH  Cognition Arousal/Alertness: Awake/alert Behavior During Therapy:  Flat affect Overall Cognitive Status: No family/caregiver present to determine baseline cognitive functioning Area of Impairment: Orientation, Memory, Following commands, Safety/judgement, Attention                 Orientation Level: Disoriented to, Time Current Attention Level: Sustained Memory: Decreased short-term memory Following Commands: Follows one step commands with increased time, Follows one step commands consistently Safety/Judgement: Decreased awareness of safety, Decreased awareness of deficits     General Comments: knew month and year, but not day of the week or date        General Comments General comments (skin integrity, edema, etc.): HR 120's in sitting, RN in the room to deliver meds and pt unable to swallow so she crushed and delivered in apple sauce, delayed swallow with liquids as well despite sitting upright on EOB.  Set up breakfast tray and pt with difficulty using fork in L hand cues for using finger for fruit, but still with difficulty pinch grip.  MD made aware    Exercises     Assessment/Plan    PT Assessment Patient needs continued PT services  PT Problem List Decreased strength;Decreased balance;Decreased mobility;Decreased activity tolerance;Decreased coordination;Decreased safety awareness;Decreased knowledge of precautions       PT Treatment Interventions DME instruction;Balance training;Functional mobility training;Therapeutic activities;Gait training;Stair training;Cognitive remediation;Therapeutic exercise;Neuromuscular re-education;Patient/family education    PT Goals (Current goals can be found in the Care Plan section)  Acute Rehab PT Goals Patient Stated Goal: agreeable to rehab PT Goal Formulation: With patient Time For Goal Achievement: 09/08/22 Potential to Achieve Goals: Fair  Frequency Min 3X/week     Co-evaluation               AM-PAC PT "6 Clicks" Mobility  Outcome Measure Help needed turning from your back to  your side while in a flat bed without using bedrails?: A Lot Help needed moving from lying on your back to sitting on the side of a flat bed without using bedrails?: Total Help needed moving to and from a bed to a chair (including a wheelchair)?: Total Help needed standing up from a chair using your arms (e.g., wheelchair or bedside chair)?: Total Help needed to walk in hospital room?: Total Help needed climbing 3-5 steps with a railing? : Total 6 Click Score: 7    End of Session Equipment Utilized During Treatment: Gait belt Activity Tolerance: Patient limited by fatigue Patient left: in bed;with bed alarm set;with call bell/phone within reach   PT Visit Diagnosis: Other abnormalities of gait and mobility (R26.89);Muscle weakness (generalized) (M62.81);Repeated falls (R29.6)    Time: [id number] PT Time Calculation (min) (ACUTE ONLY): 41 min   Charges:   PT Evaluation $PT Eval Moderate Complexity: 1 Mod PT Treatments $Therapeutic Activity: 23-37 mins        Magda Kiel, PT Acute Rehabilitation Services [phone] 08/26/2022   Reginia Naas 08/26/2022, 10:08 AM

## 2022-08-26 NOTE — Progress Notes (Addendum)
PROGRESS NOTE        PATIENT DETAILS Name: [name] Age: 67 y.o. Sex: female Date of Birth: 1956/01/09 Admit Date: 09/02/2022 Admitting Physician Lenore Cordia, MD [id number] Koren Shiver, DO  Brief Summary: Patient is a 67 y.o.  female with history of alcoholic liver cirrhosis, pancreatic cyst, HTN-who was found down at home by EMS after a wellness check-she was found to have hyponatremia, rhabdomyolysis and subsequently admitted to the hospitalist service.  Significant events: 2/15>> admit to [facility].  Significant studies: 2/15>> CXR: No PNA 2/15>> x-ray pelvis: No displaced fracture/dislocation 2/15>> CT head: No acute intracranial process 2/15>> CT C-spine: No fracture 2/15>> RUQ ultrasound: Liver parenchyma consistent with hepatic steatosis/hepatocellular disease.  Significant microbiology data: 2/15>> blood culture: No growth  Procedures: None  Consults: None  Subjective: Answering some questions appropriately-needs some gentle redirection.  Tremulous.  Does not wear how she got to the hospital-thinks "they found her in the hospital unconscious".  Claims she has a daughter in [place] and a son in [place].  Objective: Vitals: Blood pressure 114/82, pulse (!) 108, temperature 98.4 F (36.9 C), temperature source Oral, resp. rate 20, height 5' (1.524 m), weight 48.8 kg, SpO2 92 %.   Exam: Gen Exam:not in any distress HEENT:atraumatic, normocephalic Chest: B/L clear to auscultation anteriorly CVS:S1S2 regular Abdomen:soft non tender, non distended Extremities:no edema Neurology: difficult exam-but seems to move all 4 ext-confused. Per PT left lean when ambulated Skin: no rash  Pertinent Labs/Radiology:    Latest Ref Rng & Units 08/26/2022    4:44 AM 08/25/2022    2:52 AM 08/23/2022    6:07 PM  CBC  WBC 4.0 - 10.5 K/uL 7.2  11.8    Hemoglobin 12.0 - 15.0 g/dL 10.5  10.1  8.2   Hematocrit 36.0 - 46.0 % 29.3  27.8  24.0    Platelets 150 - 400 K/uL 203  269      Lab Results  Component Value Date   NA 120 (L) 08/26/2022   K 3.4 (L) 08/26/2022   CL 88 (L) 08/26/2022   CO2 17 (L) 08/26/2022      Assessment/Plan: Acute metabolic encephalopathy Found altered/down at home Unclear if this is related to hyponatremia/EtOH use/seizures Although CT imaging of C-spine/head was negative-Per PT today-she has a left lean while sitting and standing-check MRI brain to rule out CVA-check EEG.    Hyponatremia Appears to have chronic hyponatremia in the setting of liver cirrhosis at baseline Worsening hyponatremia thought to be due to hypovolemia-sodium had improved with supportive care, but this morning back down to 120.  Oral intake remains poor.  Volume status although stable Will restart IVF and follow sodium levels Recheck serum osmolality/urine osmolality/urine sodium  Rhabdomyolysis Mild Downtrending Check CK with a.m. labs  Transaminitis ? 2/2 rhabdo Repeat LFTs with a.m. labs  AKI Hemodynamically mediated/or from rhabdomyolysis Improved with supportive care  EtOH withdrawal Some tremors Although a poor historian-acknowledges that she drank couple of beers a few days back. Apparently patient had not drank in several months-as she was in a "program" Ativan per CIWA protocol  HTN BP  soft Continue to hold metoprolol/lisinopril  Alcoholic liver cirrhosis Volume status stable-some confusion probably due to EtOH withdrawal Check ammonia levels with a.m. labs  ETOH use Per history obtained-she was abstinent from alcohol until recently Will need to try  to collaborate history from friends/family  BMI: Estimated body mass index is 21.01 kg/m as calculated from the following:   Height as of this encounter: 5' (1.524 m).   Weight as of this encounter: 48.8 kg.   Code status:   Code Status: Full Code   DVT Prophylaxis: heparin injection 5,000 Units Start: 09/03/2022 2215   Family Communication:  None at [place] VM for [place] [phone] on 2/17  Disposition Plan: Status is: Inpatient Remains inpatient appropriate because: Severity of illness   Planned Discharge Destination:Home vs SNF   Diet: Diet Order             Diet Heart Room service appropriate? Yes; Fluid consistency: Thin  Diet effective now                     Antimicrobial agents: Anti-infectives (From admission, onward)    None        MEDICATIONS: Scheduled Meds:  folic acid  1 mg Oral Daily   heparin  5,000 Units Subcutaneous Q8H   multivitamin with minerals  1 tablet Oral Daily   sodium chloride flush  3 mL Intravenous Q12H   thiamine  100 mg Oral Daily   Or   thiamine  100 mg Intravenous Daily   Continuous Infusions:  sodium chloride     PRN Meds:.LORazepam, ondansetron **OR** ondansetron (ZOFRAN) IV, senna-docusate   I have personally reviewed following labs and imaging studies  LABORATORY DATA: CBC: Recent Labs  Lab 08/11/2022 1525 08/26/2022 1807 08/25/22 0252 08/26/22 0444  WBC 13.1*  --  11.8* 7.2  NEUTROABS 10.0*  --   --  5.1  HGB 10.9* 8.2* 10.1* 10.5*  HCT 29.2* 24.0* 27.8* 29.3*  MCV 102.5*  --  104.5* 105.8*  PLT 247  --  269 [id number]    Basic Metabolic Panel: Recent Labs  Lab 08/25/22 0252 08/25/22 0922 08/25/22 1711 08/26/22 0048 08/26/22 0444 08/26/22 1048  NA 120* 124* 120* 120* 124* 120*  K 4.1  --  3.6 3.4* 3.6 3.4*  CL 86*  --  89* 87* 90* 88*  CO2 15*  --  17* 18* 16* 17*  GLUCOSE 81  --  85 89 86 127*  BUN 40*  --  32* 24* 22 21  CREATININE 1.04*  --  0.94 0.81 0.84 0.90  CALCIUM 8.1*  --  7.8* 7.7* 7.8* 7.7*    GFR: Estimated Creatinine Clearance: 44.8 mL/min (by C-G formula based on SCr of 0.9 mg/dL).  Liver Function Tests: Recent Labs  Lab 09/04/2022 1525 09/05/2022 1915 08/25/22 0252  AST 363* 370* 386*  ALT 142* 116* 118*  ALKPHOS 62 52 56  BILITOT 2.0* 1.5* 1.7*  PROT 6.1* 5.0* 5.2*  ALBUMIN 3.8 3.1* 3.3*   Recent  Labs  Lab 08/30/2022 1525  LIPASE 57*   Recent Labs  Lab 08/11/2022 1426  AMMONIA 15    Coagulation Profile: Recent Labs  Lab 08/28/2022 1525 08/25/22 0252  INR 1.4* 1.3*    Cardiac Enzymes: Recent Labs  Lab 08/31/2022 1525 08/25/22 0252  CKTOTAL 7,591* 6,090*    BNP (last 3 results) No results for input(s): "PROBNP" in the last 8760 hours.  Lipid Profile: No results for input(s): "CHOL", "HDL", "LDLCALC", "TRIG", "CHOLHDL", "LDLDIRECT" in the last 72 hours.  Thyroid Function Tests: No results for input(s): "TSH", "T4TOTAL", "FREET4", "T3FREE", "THYROIDAB" in the last 72 hours.  Anemia Panel: No results for input(s): "VITAMINB12", "FOLATE", "FERRITIN", "TIBC", "IRON", "RETICCTPCT" in the last  72 hours.  Urine analysis:    Component Value Date/Time   COLORURINE YELLOW 08/13/2022 1915   APPEARANCEUR CLEAR 08/30/2022 1915   LABSPEC 1.011 08/12/2022 1915   PHURINE 5.0 08/23/2022 1915   GLUCOSEU NEGATIVE 08/29/2022 1915   HGBUR MODERATE (A) 08/28/2022 1915   HGBUR large 10/22/2009 [place] 09/04/2022 1915   BILIRUBINUR Negative 06/28/2020 1412   KETONESUR 5 (A) 08/12/2022 1915   PROTEINUR NEGATIVE 08/10/2022 1915   UROBILINOGEN 0.2 06/28/2020 1412   UROBILINOGEN 0.2 10/22/2009 1550   NITRITE NEGATIVE 08/16/2022 1915   LEUKOCYTESUR NEGATIVE 09/04/2022 1915    Sepsis Labs: Lactic Acid, Venous    Component Value Date/Time   LATICACIDVEN 1.9 09/06/2022 2149    MICROBIOLOGY: Recent Results (from the past 240 hour(s))  Culture, blood (routine x 2)     Status: None (Preliminary result)   Collection Time: 08/23/2022  3:00 PM   Specimen: BLOOD  Result Value Ref Range Status   Specimen Description BLOOD RIGHT ANTECUBITAL  Final   Special Requests   Final    BOTTLES DRAWN AEROBIC ONLY Blood Culture results may not be optimal due to an inadequate volume of blood received in culture bottles   Culture   Final    NO GROWTH 2 DAYS Performed at [facility], [place] [address], [place], [place] [address]    Report Status PENDING  Incomplete  Culture, blood (routine x 2)     Status: None (Preliminary result)   Collection Time: 08/30/2022  3:25 PM   Specimen: BLOOD  Result Value Ref Range Status   Specimen Description BLOOD SITE NOT SPECIFIED  Final   Special Requests   Final    BOTTLES DRAWN AEROBIC AND ANAEROBIC Blood Culture results may not be optimal due to an inadequate volume of blood received in culture bottles   Culture   Final    NO GROWTH 2 DAYS Performed at [facility], [place] [address], [place], [place] [address]    Report Status PENDING  Incomplete  MRSA Next Gen by PCR, Nasal     Status: None   Collection Time: 08/26/22  5:46 AM   Specimen: Nasal Mucosa; Nasal Swab  Result Value Ref Range Status   MRSA by PCR Next Gen NOT DETECTED NOT DETECTED Final    Comment: (NOTE) The GeneXpert MRSA Assay (FDA approved for NASAL specimens only), is one component of a comprehensive MRSA colonization surveillance program. It is not intended to diagnose MRSA infection nor to guide or monitor treatment for MRSA infections. Test performance is not FDA approved in patients less than 3 years old. Performed at [facility], [place] [address] [place], [place] [address]     RADIOLOGY STUDIES/RESULTS: US Abdomen Limited RUQ (LIVER/GB)  Result Date: 09/02/2022 CLINICAL DATA:  Elevated LFT EXAM: ULTRASOUND ABDOMEN LIMITED RIGHT UPPER QUADRANT COMPARISON:  MRI 08/01/2022 FINDINGS: Gallbladder: No gallstones or wall thickening visualized. No sonographic Murphy sign noted by sonographer. Common bile duct: Diameter: 7.6 mm Liver: Echogenic liver parenchyma. No focal hepatic abnormality. Portal vein is patent on color Doppler imaging with normal direction of blood flow towards the liver. Other: Small volume perihepatic ascites IMPRESSION: 1. Echogenic liver parenchyma consistent with hepatic steatosis and or hepatocellular disease. 2.  Upper normal to slightly enlarged common bile duct but stable to slightly decreased compared to MRI from January Electronically Signed   By: Donavan Foil M.D.   On: 08/29/2022 23:20   CT Head Wo Contrast  Result Date: 09/05/2022 CLINICAL DATA:  Mental status change EXAM: CT HEAD WITHOUT CONTRAST TECHNIQUE: Contiguous axial images were obtained from the base of the skull through the vertex without intravenous contrast. RADIATION DOSE REDUCTION: This exam was performed according to the departmental dose-optimization program which includes automated exposure control, adjustment of the mA and/or kV according to patient size and/or use of iterative reconstruction technique. COMPARISON:  Head CT 02/21/2021 FINDINGS: Brain: No evidence of acute infarction, hemorrhage, hydrocephalus, extra-axial collection or mass lesion/mass effect. Again seen is moderate diffuse atrophy with compensatory dilatation of the ventricular system. This is unchanged from prior. There is stable mild periventricular white matter hypodensity, likely chronic small vessel ischemic change. Vascular: Atherosclerotic calcifications are present within the cavernous internal carotid arteries. Skull: Normal. Negative for fracture or focal lesion. Sinuses/Orbits: No acute finding. Other: None. IMPRESSION: 1. No acute intracranial process. 2. Stable moderate diffuse atrophy with compensatory dilatation of the ventricular system. 3. Stable mild periventricular white matter hypodensity, likely chronic small vessel ischemic change. Electronically Signed   By: Ronney Asters M.D.   On: 08/12/2022 18:52   CT Cervical Spine Wo Contrast  Result Date: 08/22/2022 CLINICAL DATA:  Neck trauma.  Patient was found on the floor. EXAM: CT CERVICAL SPINE WITHOUT CONTRAST TECHNIQUE: Multidetector CT imaging of the cervical spine was performed without intravenous contrast. Multiplanar CT image reconstructions were also generated. RADIATION DOSE REDUCTION: This exam  was performed according to the departmental dose-optimization program which includes automated exposure control, adjustment of the mA and/or kV according to patient size and/or use of iterative reconstruction technique. COMPARISON:  None Available. FINDINGS: Alignment: Normal alignment. Skull base and vertebrae: Skull base appears intact. No vertebral compression deformities. No focal bone lesion or bone destruction. Soft tissues and spinal canal: No prevertebral soft tissue swelling. No abnormal paraspinal soft tissue mass or infiltration. Disc levels: Degenerative changes throughout with disc space narrowing and endplate osteophyte formation. Degenerative changes in the posterior facet joints. Uncovertebral spurring and facet joint hypertrophy cause bone encroachment on multiple neural foramina bilaterally. Upper chest: Moderate bilateral pleural effusions are demonstrated. Other: Degenerative changes in the right shoulder with heterotopic ossification. IMPRESSION: Normal alignment. Severe diffuse degenerative changes. No acute displaced fractures are identified. Electronically Signed   By: Lucienne Capers M.D.   On: 08/13/2022 18:48   DG Pelvis Portable  Addendum Date: 08/12/2022   ADDENDUM REPORT: 08/19/2022 16:38 ADDENDUM: Addendum is made to correct voice recognition error in the initially issued report. Impression should read "No displaced fracture or dislocation of the pelvis or bilateral proximal femurs seen in single frontal view." Electronically Signed   By: Delanna Ahmadi M.D.   On: 08/18/2022 16:38   Result Date: 08/29/2022 CLINICAL DATA:  Altered mental status, fall EXAM: PORTABLE PELVIS 1-2 VIEWS COMPARISON:  None Available. FINDINGS: There is no evidence of displaced pelvic fracture or diastasis. No pelvic bone lesions are seen. IMPRESSION: Displaced fracture or dislocation of the pelvis or bilateral proximal femurs seen in single frontal view. Electronically Signed: By: Delanna Ahmadi M.D. On:  08/18/2022 15:35   DG Chest Portable 1 View  Result Date: 08/23/2022 CLINICAL DATA:  Altered mental status.  Fall. EXAM: PORTABLE CHEST 1 VIEW COMPARISON:  Chest radiograph dated June 13, 2015 FINDINGS: The heart size and mediastinal contours are within normal limits. Both lungs are clear. The visualized skeletal structures are unremarkable. Soft tissue densities projecting over the bilateral hemithorax suggesting breast implants IMPRESSION: No active disease. Electronically Signed   By: Wyatt Mage  Ahmed D.O.   On: 09/03/2022 15:30     LOS: 2 days   Oren Binet, MD  [facility]    To contact the attending provider between 7A-7P or the covering provider during after hours 7P-7A, please log into the web site [email or web address] and access using universal  password for that web site. If you do not have the password, please call the hospital operator.  08/26/2022, 11:22 AM

## 2022-08-26 NOTE — Progress Notes (Incomplete)
Patient had a cough and small amount of coffee ground vomiting.Spo2 dropped down to 80s Informed RT and

## 2022-08-26 NOTE — Progress Notes (Signed)
Informed by RN-she is somewhat lethargic Easily arousable-to loud verbal stimuli. Appears a bit more lethargic than this am Moving all 4 ext when asked Follows simple commands, speech slow but clear, conversing with me  Lungs: some bibasilar rales Abd: soft Non tender CVS:S1S2 Tachy  BP 95/62 Pulse 127-sinus tach  CXR: read as pul edema  Plan Hold IVF Await BMET Will try lasix w Albumin, aling with midodrine Follow closely

## 2022-08-27 ENCOUNTER — Encounter (HOSPITAL_COMMUNITY): Admission: EM | Disposition: E | Payer: Self-pay | Source: Home / Self Care | Attending: Internal Medicine

## 2022-08-27 ENCOUNTER — Inpatient Hospital Stay (HOSPITAL_COMMUNITY): Payer: Medicare Other

## 2022-08-27 DIAGNOSIS — I639 Cerebral infarction, unspecified: Secondary | ICD-10-CM

## 2022-08-27 DIAGNOSIS — J9601 Acute respiratory failure with hypoxia: Secondary | ICD-10-CM

## 2022-08-27 DIAGNOSIS — E871 Hypo-osmolality and hyponatremia: Secondary | ICD-10-CM | POA: Diagnosis not present

## 2022-08-27 DIAGNOSIS — R7401 Elevation of levels of liver transaminase levels: Secondary | ICD-10-CM

## 2022-08-27 DIAGNOSIS — K2210 Ulcer of esophagus without bleeding: Secondary | ICD-10-CM

## 2022-08-27 DIAGNOSIS — K922 Gastrointestinal hemorrhage, unspecified: Secondary | ICD-10-CM

## 2022-08-27 DIAGNOSIS — T68XXXA Hypothermia, initial encounter: Secondary | ICD-10-CM

## 2022-08-27 DIAGNOSIS — R4182 Altered mental status, unspecified: Secondary | ICD-10-CM | POA: Diagnosis not present

## 2022-08-27 DIAGNOSIS — I6389 Other cerebral infarction: Secondary | ICD-10-CM | POA: Diagnosis not present

## 2022-08-27 DIAGNOSIS — K2289 Other specified disease of esophagus: Secondary | ICD-10-CM

## 2022-08-27 DIAGNOSIS — J9602 Acute respiratory failure with hypercapnia: Secondary | ICD-10-CM

## 2022-08-27 DIAGNOSIS — K920 Hematemesis: Secondary | ICD-10-CM

## 2022-08-27 DIAGNOSIS — M6282 Rhabdomyolysis: Secondary | ICD-10-CM | POA: Diagnosis not present

## 2022-08-27 DIAGNOSIS — R571 Hypovolemic shock: Secondary | ICD-10-CM

## 2022-08-27 HISTORY — PX: ESOPHAGOGASTRODUODENOSCOPY (EGD) WITH PROPOFOL: SHX5813

## 2022-08-27 HISTORY — PX: HEMOSTASIS CONTROL: SHX6838

## 2022-08-27 HISTORY — PX: SUBMUCOSAL INJECTION: SHX5543

## 2022-08-27 LAB — CBC WITH DIFFERENTIAL/PLATELET
Abs Immature Granulocytes: 0 10*3/uL (ref 0.00–0.07)
Basophils Absolute: 0 10*3/uL (ref 0.0–0.1)
Basophils Relative: 0 %
Eosinophils Absolute: 0 10*3/uL (ref 0.0–0.5)
Eosinophils Relative: 0 %
HCT: 16.5 % — ABNORMAL LOW (ref 36.0–46.0)
Hemoglobin: 6 g/dL — CL (ref 12.0–15.0)
Lymphocytes Relative: 10 %
Lymphs Abs: 0.8 10*3/uL (ref 0.7–4.0)
MCH: 38.2 pg — ABNORMAL HIGH (ref 26.0–34.0)
MCHC: 36.4 g/dL — ABNORMAL HIGH (ref 30.0–36.0)
MCV: 105.1 fL — ABNORMAL HIGH (ref 80.0–100.0)
Monocytes Absolute: 0.2 10*3/uL (ref 0.1–1.0)
Monocytes Relative: 2 %
Neutro Abs: 7 10*3/uL (ref 1.7–7.7)
Neutrophils Relative %: 88 %
Platelets: 156 10*3/uL (ref 150–400)
RBC: 1.57 MIL/uL — ABNORMAL LOW (ref 3.87–5.11)
RDW: 12.2 % (ref 11.5–15.5)
WBC: 8 10*3/uL (ref 4.0–10.5)
nRBC: 0 /100 WBC
nRBC: 0.5 % — ABNORMAL HIGH (ref 0.0–0.2)

## 2022-08-27 LAB — PHOSPHORUS: Phosphorus: 1.3 mg/dL — ABNORMAL LOW (ref 2.5–4.6)

## 2022-08-27 LAB — ECHOCARDIOGRAM COMPLETE
AR max vel: 1.67 cm2
AV Area VTI: 1.66 cm2
AV Area mean vel: 1.58 cm2
AV Mean grad: 18 mmHg
AV Peak grad: 25.8 mmHg
Ao pk vel: 2.54 m/s
Area-P 1/2: 3.46 cm2
Est EF: >75
Height: 60 in
MV M vel: 7.02 m/s
MV Peak grad: 197.1 mmHg
S' Lateral: 1.6 cm
Weight: 1721.35 oz

## 2022-08-27 LAB — CBC
HCT: 21.9 % — ABNORMAL LOW (ref 36.0–46.0)
Hemoglobin: 7.9 g/dL — ABNORMAL LOW (ref 12.0–15.0)
MCH: 37.8 pg — ABNORMAL HIGH (ref 26.0–34.0)
MCHC: 36.1 g/dL — ABNORMAL HIGH (ref 30.0–36.0)
MCV: 104.8 fL — ABNORMAL HIGH (ref 80.0–100.0)
Platelets: 197 10*3/uL (ref 150–400)
RBC: 2.09 MIL/uL — ABNORMAL LOW (ref 3.87–5.11)
RDW: 11.8 % (ref 11.5–15.5)
WBC: 10.2 10*3/uL (ref 4.0–10.5)
nRBC: 0.4 % — ABNORMAL HIGH (ref 0.0–0.2)

## 2022-08-27 LAB — COMPREHENSIVE METABOLIC PANEL
ALT: 57 U/L — ABNORMAL HIGH (ref 0–44)
AST: 157 U/L — ABNORMAL HIGH (ref 15–41)
Albumin: 3.9 g/dL (ref 3.5–5.0)
Alkaline Phosphatase: 37 U/L — ABNORMAL LOW (ref 38–126)
Anion gap: 15 (ref 5–15)
BUN: 20 mg/dL (ref 8–23)
CO2: 19 mmol/L — ABNORMAL LOW (ref 22–32)
Calcium: 7.9 mg/dL — ABNORMAL LOW (ref 8.9–10.3)
Chloride: 90 mmol/L — ABNORMAL LOW (ref 98–111)
Creatinine, Ser: 0.83 mg/dL (ref 0.44–1.00)
GFR, Estimated: >60 mL/min (ref >60)
Glucose, Bld: 104 mg/dL — ABNORMAL HIGH (ref 70–99)
Potassium: 3.6 mmol/L (ref 3.5–5.1)
Sodium: 124 mmol/L — ABNORMAL LOW (ref 135–145)
Total Bilirubin: 2 mg/dL — ABNORMAL HIGH (ref 0.3–1.2)
Total Protein: 5.4 g/dL — ABNORMAL LOW (ref 6.5–8.1)

## 2022-08-27 LAB — AMMONIA: Ammonia: 15 umol/L (ref 9–35)

## 2022-08-27 LAB — POCT I-STAT 7, (LYTES, BLD GAS, ICA,H+H)
Acid-base deficit: 6 mmol/L — ABNORMAL HIGH (ref 0.0–2.0)
Bicarbonate: 18.5 mmol/L — ABNORMAL LOW (ref 20.0–28.0)
Calcium, Ion: 1.12 mmol/L — ABNORMAL LOW (ref 1.15–1.40)
HCT: 17 % — ABNORMAL LOW (ref 36.0–46.0)
Hemoglobin: 5.8 g/dL — CL (ref 12.0–15.0)
O2 Saturation: 98 %
Potassium: 3.8 mmol/L (ref 3.5–5.1)
Sodium: 127 mmol/L — ABNORMAL LOW (ref 135–145)
TCO2: 19 mmol/L — ABNORMAL LOW (ref 22–32)
pCO2 arterial: 28.4 mmHg — ABNORMAL LOW (ref 32–48)
pH, Arterial: 7.42 (ref 7.35–7.45)
pO2, Arterial: 100 mmHg (ref 83–108)

## 2022-08-27 LAB — BLOOD GAS, ARTERIAL
Acid-base deficit: 4.1 mmol/L — ABNORMAL HIGH (ref 0.0–2.0)
Bicarbonate: 20 mmol/L (ref 20.0–28.0)
O2 Saturation: 59.2 %
Patient temperature: 37
pCO2 arterial: 33 mmHg (ref 32–48)
pH, Arterial: 7.39 (ref 7.35–7.45)
pO2, Arterial: 35 mmHg — CL (ref 83–108)

## 2022-08-27 LAB — LIPID PANEL
Cholesterol: 77 mg/dL (ref 0–200)
HDL: 30 mg/dL — ABNORMAL LOW (ref >40)
LDL Cholesterol: 38 mg/dL (ref 0–99)
Total CHOL/HDL Ratio: 2.6 RATIO
Triglycerides: 46 mg/dL (ref <150)
VLDL: 9 mg/dL (ref 0–40)

## 2022-08-27 LAB — PROTIME-INR
INR: 1.5 — ABNORMAL HIGH (ref 0.8–1.2)
Prothrombin Time: 17.8 seconds — ABNORMAL HIGH (ref 11.4–15.2)

## 2022-08-27 LAB — RESP PANEL BY RT-PCR (RSV, FLU A&B, COVID)  RVPGX2
Influenza A by PCR: NEGATIVE
Influenza B by PCR: NEGATIVE
Resp Syncytial Virus by PCR: NEGATIVE
SARS Coronavirus 2 by RT PCR: POSITIVE — AB

## 2022-08-27 LAB — CK: Total CK: 1798 U/L — ABNORMAL HIGH (ref 38–234)

## 2022-08-27 LAB — TROPONIN I (HIGH SENSITIVITY)
Troponin I (High Sensitivity): 68 ng/L — ABNORMAL HIGH (ref <18)
Troponin I (High Sensitivity): 67 ng/L — ABNORMAL HIGH (ref <18)

## 2022-08-27 LAB — LIPASE, BLOOD: Lipase: 308 U/L — ABNORMAL HIGH (ref 11–51)

## 2022-08-27 LAB — FIBRINOGEN: Fibrinogen: 314 mg/dL (ref 210–475)

## 2022-08-27 LAB — ABO/RH: ABO/RH(D): A NEG

## 2022-08-27 LAB — LACTIC ACID, PLASMA: Lactic Acid, Venous: 1.9 mmol/L (ref 0.5–1.9)

## 2022-08-27 LAB — BRAIN NATRIURETIC PEPTIDE: B Natriuretic Peptide: 171.4 pg/mL — ABNORMAL HIGH (ref 0.0–100.0)

## 2022-08-27 LAB — GLUCOSE, CAPILLARY
Glucose-Capillary: 73 mg/dL (ref 70–99)
Glucose-Capillary: 165 mg/dL — ABNORMAL HIGH (ref 70–99)

## 2022-08-27 LAB — HEMOGLOBIN AND HEMATOCRIT, BLOOD
HCT: 20.9 % — ABNORMAL LOW (ref 36.0–46.0)
Hemoglobin: 7.7 g/dL — ABNORMAL LOW (ref 12.0–15.0)

## 2022-08-27 LAB — MRSA NEXT GEN BY PCR, NASAL: MRSA by PCR Next Gen: NOT DETECTED

## 2022-08-27 LAB — MAGNESIUM: Magnesium: 2.1 mg/dL (ref 1.7–2.4)

## 2022-08-27 LAB — PREPARE RBC (CROSSMATCH)

## 2022-08-27 LAB — PROCALCITONIN: Procalcitonin: 1.98 ng/mL

## 2022-08-27 SURGERY — ESOPHAGOGASTRODUODENOSCOPY (EGD) WITH PROPOFOL
Anesthesia: Moderate Sedation

## 2022-08-27 MED ORDER — POLYETHYLENE GLYCOL 3350 17 G PO PACK: 17.0000 g | PACK | Freq: Every day | ORAL | Status: DC

## 2022-08-27 MED ORDER — PROPOFOL 1000 MG/100ML IV EMUL
0.0000 ug/kg/min | INTRAVENOUS | Status: DC
  Administered 2022-08-27: 15 ug/kg/min via INTRAVENOUS

## 2022-08-27 MED ORDER — SODIUM CHLORIDE 0.9 % IV SOLN: INTRAVENOUS | Status: DC | PRN

## 2022-08-27 MED ORDER — POTASSIUM PHOSPHATES 15 MMOLE/5ML IV SOLN
15.0000 mmol | Freq: Once | INTRAVENOUS | Status: AC
  Administered 2022-08-27: 15 mmol via INTRAVENOUS
  Filled 2022-08-27: qty 5

## 2022-08-27 MED ORDER — GLYCOPYRROLATE 0.2 MG/ML IJ SOLN: 0.2000 mg | INTRAMUSCULAR | Status: DC | PRN

## 2022-08-27 MED ORDER — FENTANYL CITRATE PF 50 MCG/ML IJ SOSY: 25.0000 ug | PREFILLED_SYRINGE | Freq: Once | INTRAMUSCULAR | Status: DC

## 2022-08-27 MED ORDER — ONDANSETRON HCL 4 MG/2ML IJ SOLN: 4.0000 mg | Freq: Four times a day (QID) | INTRAMUSCULAR | Status: DC | PRN

## 2022-08-27 MED ORDER — FENTANYL BOLUS VIA INFUSION: 25.0000 ug | INTRAVENOUS | Status: DC | PRN

## 2022-08-27 MED ORDER — ONDANSETRON HCL 4 MG PO TABS: 4.0000 mg | ORAL_TABLET | Freq: Four times a day (QID) | ORAL | Status: DC | PRN

## 2022-08-27 MED ORDER — MIDAZOLAM HCL 2 MG/2ML IJ SOLN: 1.0000 mg | INTRAMUSCULAR | Status: DC | PRN

## 2022-08-27 MED ORDER — SODIUM CHLORIDE 0.9 % IV SOLN: INTRAVENOUS | Status: DC

## 2022-08-27 MED ORDER — FUROSEMIDE 10 MG/ML IJ SOLN
40.0000 mg | Freq: Once | INTRAMUSCULAR | Status: DC
  Filled 2022-08-27: qty 4

## 2022-08-27 MED ORDER — SODIUM CHLORIDE 0.9 % IV SOLN
3.0000 g | Freq: Four times a day (QID) | INTRAVENOUS | Status: DC
  Administered 2022-08-27 (×2): 3 g via INTRAVENOUS
  Filled 2022-08-27 (×2): qty 8

## 2022-08-27 MED ORDER — SODIUM CHLORIDE (PF) 0.9 % IJ SOLN
PREFILLED_SYRINGE | INTRAMUSCULAR | Status: DC | PRN
  Administered 2022-08-27: 4 mL

## 2022-08-27 MED ORDER — NOREPINEPHRINE 4 MG/250ML-% IV SOLN
INTRAVENOUS | Status: AC
  Administered 2022-08-27: 4 mg
  Filled 2022-08-27: qty 250

## 2022-08-27 MED ORDER — GLYCOPYRROLATE 1 MG PO TABS: 1.0000 mg | ORAL_TABLET | ORAL | Status: DC | PRN

## 2022-08-27 MED ORDER — ETOMIDATE 2 MG/ML IV SOLN
INTRAVENOUS | Status: AC
  Administered 2022-08-27: 20 mg
  Filled 2022-08-27: qty 20

## 2022-08-27 MED ORDER — FENTANYL 2500MCG IN NS 250ML (10MCG/ML) PREMIX INFUSION: 0.0000 ug/h | INTRAVENOUS | Status: DC

## 2022-08-27 MED ORDER — NOREPINEPHRINE 4 MG/250ML-% IV SOLN
0.0000 ug/min | INTRAVENOUS | Status: DC
  Administered 2022-08-27: 2 ug/min via INTRAVENOUS

## 2022-08-27 MED ORDER — SODIUM CHLORIDE 0.9% IV SOLUTION: Freq: Once | INTRAVENOUS | Status: DC

## 2022-08-27 MED ORDER — THIAMINE HCL 100 MG/ML IJ SOLN
500.0000 mg | Freq: Three times a day (TID) | INTRAVENOUS | Status: DC
  Administered 2022-08-27: 500 mg via INTRAVENOUS
  Filled 2022-08-27 (×5): qty 5

## 2022-08-27 MED ORDER — ACETAMINOPHEN 650 MG RE SUPP: 650.0000 mg | Freq: Four times a day (QID) | RECTAL | Status: DC | PRN

## 2022-08-27 MED ORDER — PHENYLEPHRINE 80 MCG/ML (10ML) SYRINGE FOR IV PUSH (FOR BLOOD PRESSURE SUPPORT)
PREFILLED_SYRINGE | INTRAVENOUS | Status: AC
  Filled 2022-08-27: qty 20

## 2022-08-27 MED ORDER — SODIUM CHLORIDE 0.9 % IV SOLN
2.0000 g | INTRAVENOUS | Status: DC
  Administered 2022-08-27: 2 g via INTRAVENOUS
  Filled 2022-08-27: qty 20

## 2022-08-27 MED ORDER — FENTANYL BOLUS VIA INFUSION: 100.0000 ug | INTRAVENOUS | Status: DC | PRN

## 2022-08-27 MED ORDER — PANTOPRAZOLE SODIUM 40 MG IV SOLR: 40.0000 mg | Freq: Two times a day (BID) | INTRAVENOUS | Status: DC

## 2022-08-27 MED ORDER — MIDAZOLAM BOLUS VIA INFUSION (WITHDRAWAL LIFE SUSTAINING TX): 2.0000 mg | INTRAVENOUS | Status: DC | PRN

## 2022-08-27 MED ORDER — SPOT INK MARKER SYRINGE KIT
PACK | SUBMUCOSAL | Status: AC
  Filled 2022-08-27: qty 5

## 2022-08-27 MED ORDER — FOLIC ACID 1 MG PO TABS: 1.0000 mg | ORAL_TABLET | Freq: Every day | ORAL | Status: DC

## 2022-08-27 MED ORDER — SODIUM CHLORIDE 0.9 % IV SOLN
50.0000 ug/h | INTRAVENOUS | Status: DC
  Administered 2022-08-27: 50 ug/h via INTRAVENOUS
  Filled 2022-08-27 (×2): qty 1

## 2022-08-27 MED ORDER — POLYVINYL ALCOHOL 1.4 % OP SOLN: 1.0000 [drp] | Freq: Four times a day (QID) | OPHTHALMIC | Status: DC | PRN

## 2022-08-27 MED ORDER — PANTOPRAZOLE 80MG IVPB - SIMPLE MED
80.0000 mg | Freq: Once | INTRAVENOUS | Status: AC
  Administered 2022-08-27: 80 mg via INTRAVENOUS
  Filled 2022-08-27: qty 100

## 2022-08-27 MED ORDER — FENTANYL 2500MCG IN NS 250ML (10MCG/ML) PREMIX INFUSION
25.0000 ug/h | INTRAVENOUS | Status: DC
  Administered 2022-08-27: 100 ug/h via INTRAVENOUS

## 2022-08-27 MED ORDER — ROCURONIUM BROMIDE 10 MG/ML (PF) SYRINGE
PREFILLED_SYRINGE | INTRAVENOUS | Status: AC
  Administered 2022-08-27: 100 mg
  Filled 2022-08-27: qty 10

## 2022-08-27 MED ORDER — ORAL CARE MOUTH RINSE: 15.0000 mL | OROMUCOSAL | Status: DC

## 2022-08-27 MED ORDER — PROPOFOL 1000 MG/100ML IV EMUL
INTRAVENOUS | Status: AC
  Filled 2022-08-27: qty 100

## 2022-08-27 MED ORDER — PANTOPRAZOLE INFUSION (NEW) - SIMPLE MED
8.0000 mg/h | INTRAVENOUS | Status: DC
  Administered 2022-08-27: 8 mg/h via INTRAVENOUS
  Filled 2022-08-27 (×3): qty 100

## 2022-08-27 MED ORDER — EPINEPHRINE 1 MG/10ML IJ SOSY
PREFILLED_SYRINGE | INTRAMUSCULAR | Status: AC
  Filled 2022-08-27: qty 20

## 2022-08-27 MED ORDER — PERFLUTREN LIPID MICROSPHERE
1.0000 mL | INTRAVENOUS | Status: AC | PRN
  Administered 2022-08-27: 2 mL via INTRAVENOUS

## 2022-08-27 MED ORDER — FENTANYL 2500MCG IN NS 250ML (10MCG/ML) PREMIX INFUSION
INTRAVENOUS | Status: AC
  Administered 2022-08-27: 100 ug/h
  Filled 2022-08-27: qty 250

## 2022-08-27 MED ORDER — ALBUMIN HUMAN 25 % IV SOLN
12.5000 g | Freq: Once | INTRAVENOUS | Status: AC
  Administered 2022-08-27: 12.5 g via INTRAVENOUS
  Filled 2022-08-27: qty 50

## 2022-08-27 MED ORDER — SODIUM CHLORIDE 0.9% IV SOLUTION: Freq: Once | INTRAVENOUS | Status: AC

## 2022-08-27 MED ORDER — MIDAZOLAM-SODIUM CHLORIDE 100-0.9 MG/100ML-% IV SOLN
0.0000 mg/h | INTRAVENOUS | Status: DC
  Administered 2022-08-27: 5 mg/h via INTRAVENOUS
  Filled 2022-08-27: qty 100

## 2022-08-27 MED ORDER — DOCUSATE SODIUM 50 MG/5ML PO LIQD: 100.0000 mg | Freq: Two times a day (BID) | ORAL | Status: DC

## 2022-08-27 MED ORDER — FUROSEMIDE 10 MG/ML IJ SOLN: 40.0000 mg | Freq: Once | INTRAMUSCULAR | Status: DC

## 2022-08-27 MED ORDER — CHLORHEXIDINE GLUCONATE CLOTH 2 % EX PADS
6.0000 | MEDICATED_PAD | Freq: Every day | CUTANEOUS | Status: DC
  Administered 2022-08-27: 6 via TOPICAL

## 2022-08-27 MED ORDER — ORAL CARE MOUTH RINSE: 15.0000 mL | OROMUCOSAL | Status: DC | PRN

## 2022-08-27 MED ORDER — ACETAMINOPHEN 325 MG PO TABS: 650.0000 mg | ORAL_TABLET | Freq: Four times a day (QID) | ORAL | Status: DC | PRN

## 2022-08-27 MED ORDER — SENNOSIDES-DOCUSATE SODIUM 8.6-50 MG PO TABS: 1.0000 | ORAL_TABLET | Freq: Every evening | ORAL | Status: DC | PRN

## 2022-08-27 MED ORDER — FENTANYL CITRATE PF 50 MCG/ML IJ SOSY
PREFILLED_SYRINGE | INTRAMUSCULAR | Status: AC
  Administered 2022-08-27: 50 ug
  Filled 2022-08-27: qty 2

## 2022-08-27 SURGICAL SUPPLY — 15 items

## 2022-08-28 LAB — PREPARE RBC (CROSSMATCH)

## 2022-08-28 LAB — GLUCOSE, CAPILLARY: Glucose-Capillary: 112 mg/dL — ABNORMAL HIGH (ref 70–99)

## 2022-08-29 ENCOUNTER — Encounter (HOSPITAL_COMMUNITY): Payer: Self-pay | Admitting: Gastroenterology

## 2022-08-29 LAB — BPAM RBC
Blood Product Expiration Date: 202403042359
Blood Product Expiration Date: 202403042359
Blood Product Expiration Date: 202403062359
ISSUE DATE / TIME: 202402181135
ISSUE DATE / TIME: 202402181616
Unit Type and Rh: 600
Unit Type and Rh: 600
Unit Type and Rh: 600

## 2022-08-29 LAB — CULTURE, BLOOD (ROUTINE X 2)
Culture: NO GROWTH
Culture: NO GROWTH

## 2022-08-29 LAB — CULTURE, RESPIRATORY W GRAM STAIN

## 2022-08-29 LAB — TYPE AND SCREEN
ABO/RH(D): A NEG
Antibody Screen: NEGATIVE
Unit division: 0
Unit division: 0
Unit division: 0

## 2022-09-08 NOTE — Progress Notes (Signed)
Echocardiogram 2D Echocardiogram has been performed.  Oneal Deputy Tanav Orsak RDCS 09/22/22, 2:52 PM

## 2022-09-08 NOTE — Progress Notes (Signed)
   08/13/2022 0506  Assess: MEWS Score  Temp 98.1 F (36.7 C)  BP 104/67  MAP (mmHg) 78  Pulse Rate (!) 118  ECG Heart Rate (!) 121  Resp (!) 28  Level of Consciousness Responds to Voice  SpO2 90 %  O2 Device HFNC  O2 Flow Rate (L/min) 15 L/min  Assess: if the MEWS score is Yellow or Red  Were vital signs taken at a resting state? Yes  Focused Assessment Change from prior assessment (see assessment flowsheet)  Does the patient meet 2 or more of the SIRS criteria? No  MEWS guidelines implemented  Yes, red  Treat  MEWS Interventions Considered administering scheduled or prn medications/treatments as ordered  Take Vital Signs  Increase Vital Sign Frequency  Red: Q1hr x2, continue Q4hrs until patient remains green for 12hrs  Escalate  MEWS: Escalate Red: Discuss with charge nurse and notify provider. Consider notifying RRT. If remains red for 2 hours consider need for higher level of care  Notify: Charge Nurse/RN  Name of Charge Nurse/RN Notified Lexine Baton, RN  Provider Notification  Provider Name/Title Ninetta Lights  Date Provider Notified 08/30/2022  Method of Notification Page  Notification Reason Other (Comment)  Assess: SIRS CRITERIA  SIRS Temperature  0  SIRS Pulse 1  SIRS Respirations  1  SIRS WBC 0  SIRS Score Sum  2

## 2022-09-08 NOTE — Op Note (Signed)
[facility] Patient Name: [name] Procedure Date : 09-09-22 MRN: [id number] Attending MD: Gladstone Pih. Candis Schatz , MD, [id number] Date of Birth: 1955-09-04 CSN: [id number] Age: 67 Admit Type: Inpatient Procedure:                Upper GI endoscopy Indications:              Hematemesis Providers:                Nicki Reaper E. Candis Schatz, MD, Carlyn Reichert, RN, Brien Mates, Technician Referring MD:              Medicines:                Intubated, sedated patient on propofol and fentanyl                            drip in ICU. Extra propofol bolus given by ICU                            nurse prior to procedure start Complications:            No immediate complications. Estimated Blood Loss:     Estimated blood loss was minimal. Procedure:                Pre-Anesthesia Assessment:                           - Prior to the procedure, a History and Physical                            was performed, and patient medications and                            allergies were reviewed. The patient's tolerance of                            previous anesthesia was also reviewed. The risks                            and benefits of the procedure and the sedation                            options and risks were discussed with the patient.                            All questions were answered, and informed consent                            was obtained. Prior Anticoagulants: The patient has                            taken no anticoagulant or antiplatelet agents. ASA  Grade Assessment: E - Emergency. After reviewing                            the risks and benefits, the patient was deemed in                            satisfactory condition to undergo the procedure.                           After obtaining informed consent, the endoscope was                            passed under direct vision. Throughout the                             procedure, the patient's blood pressure, pulse, and                            oxygen saturations were monitored continuously. The                            [id number] Olympus endoscope was introduced                            through the mouth, and advanced to the second part                            of duodenum. The upper GI endoscopy was                            accomplished without difficulty. The patient                            tolerated the procedure well. Scope In: Scope Out: Findings:      Diffuse severe mucosal changes characterized by black/purple       discoloration, friability with mucosal sloughing were found in the mid       esophagus and in the distal esophagus. There was a linear tear near the       GEJ that was actively bleeding. This was successfully injected with 4 mL       of a 0.1 mg/mL solution of epinephrine for hemostasis. Estimated blood       loss was minimal. Because of severe, diffuse mucosal fragility and       contact oozing, the tear was not felt to be amenable to hemoclip       placement. To prevent further bleeding, hemostatic gel was applied to       the lesion.      Extensive blood (red, hematin, clot) was found in the gastric fundus and       in the gastric body. Complete visualization of the entire gastric mucosa       was not acheived.      The exam of the stomach was otherwise normal.      The examined duodenum was normal. Impression:               -  Discolored, friable mucosa with sloughing in the                            esophagus with focal mucosal tear with active                            bleeding. Injected. Hemostatic gel applied.                            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                           - Extensive blood in the gastric fundus and in the                            gastric body.                           - Normal examined  duodenum.                           - No specimens collected. Moderate Sedation:      N/A Recommendation:           - Return patient to ICU for ongoing care.                           - NPO.                           - Although no varices, continued octreotide drip                            may help reduce bleeding                           - Continue IV PPI gtt today; consider decrease to                            BID tomorrow                           - Would be cautious about introducing NG/OG tube                            given compromised mucosal integrity.                           - Doubtful if repeat endoscopic evaluation or                            treatment would be helpful if patient continues to  rebleed                           - Patient's prognosis very poor.                           - GI will sign off for now, but please reconsult if                            the patient is bleeding again Procedure Code(s):        --- Professional ---                           [phone], Esophagogastroduodenoscopy, flexible,                            transoral; with control of bleeding, any method Diagnosis Code(s):        --- Professional ---                           K22.89, Other specified disease of esophagus                           K22.10, Ulcer of esophagus without bleeding                           K92.2, Gastrointestinal hemorrhage, unspecified                           K92.0, Hematemesis CPT copyright 2022 American [employer] Association. All rights reserved. The codes documented in this report are preliminary and upon coder review may  be revised to meet current compliance requirements. Zienna Ahlin E. Candis Schatz, MD Sep 11, 2022 2:55:21 PM This report has been signed electronically. Number of Addenda: 0

## 2022-09-08 NOTE — Procedures (Signed)
Central Venous Catheter Insertion Procedure Note  [name]  [id number]  Nov 26, 1955  Date:10-Sep-2022  Time:9:54 AM   Provider Performing:Rykin Route   Procedure: Insertion of Non-tunneled Central Venous Catheter(36556) with US guidance [id number])   Indication(s) Difficult access  Consent Unable to obtain consent due to emergent nature of procedure.  Anesthesia Topical only with 1% lidocaine   Timeout Verified patient identification, verified procedure, site/side was marked, verified correct patient position, special equipment/implants available, medications/allergies/relevant history reviewed, required imaging and test results available.  Sterile Technique Maximal sterile technique including full sterile barrier drape, hand hygiene, sterile gown, sterile gloves, mask, hair covering, sterile ultrasound probe cover (if used).  Procedure Description Area of catheter insertion was cleaned with chlorhexidine and draped in sterile fashion.  With real-time ultrasound guidance a central venous catheter was placed into the right internal jugular vein. Nonpulsatile blood flow and easy flushing noted in all ports.  The catheter was sutured in place and sterile dressing applied.  Complications/Tolerance None; patient tolerated the procedure well. Chest X-ray is ordered to verify placement for internal jugular or subclavian cannulation.   Chest x-ray is not ordered for femoral cannulation.  EBL Minimal  Specimen(s) None  Marshell Garfinkel MD [facility] See Amion for pager  If no response to pager , please call [phone] until 7pm After 7:00 pm call Elink  [phone] 09/10/22, 9:55 AM

## 2022-09-08 NOTE — Progress Notes (Addendum)
STROKE TEAM PROGRESS NOTE   INTERVAL HISTORY Patient is seen in her room with a friend at the bedside.  Yesterday, she presented to the ED after being found down at home during a wellness check.  She was noted to be hypothermic with hyponatremia and rhabdomyolysis.  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  This morning, she had several episodes of bloody emesis and was intubated for airway protection.  Prior to intubation, she was reportedly oriented to person and place and was able to communicate well.  Vitals:   09-05-2022 1133 09/05/2022 1145 09-05-2022 1148 05-Sep-2022 1200  BP: (!) 128/99 (!) 128/99 103/81 104/78  Pulse: (!) 116  (!) 114 (!) 115  Resp: (!) 25 (!) 21 (!) 28 18  Temp: 98.1 F (36.7 C)  98 F (36.7 C)   TempSrc: Oral     SpO2: 100%   100%  Weight:      Height:       CBC:  Recent Labs  Lab 08/23/2022 1525 08/21/2022 1807 08/26/22 0444 Sep 05, 2022 0200 09-05-2022 0439 2022/09/05 1041  WBC 13.1*   < > 7.2 10.2  --   --   NEUTROABS 10.0*  --  5.1  --   --   --   HGB 10.9*   < > 10.5* 7.9* 7.7* 5.8*  HCT 29.2*   < > 29.3* 21.9* 20.9* 17.0*  MCV 102.5*   < > 105.8* 104.8*  --   --   PLT 247   < > 203 197  --   --    < > = values in this interval not displayed.   Basic Metabolic Panel:  Recent Labs  Lab 08/26/22 1756 2022/09/05 0200 09-05-2022 1041  NA 121* 124* 127*  K 3.3* 3.6 3.8  CL 88* 90*  --   CO2 17* 19*  --   GLUCOSE 98 104*  --   BUN 20 20  --   CREATININE 0.78 0.83  --   CALCIUM 7.7* 7.9*  --   MG  --  2.1  --   PHOS  --  1.3*  --    Lipid Panel:  Recent Labs  Lab 09-05-2022 0200  CHOL 77  TRIG 46  HDL 30*  CHOLHDL 2.6  VLDL 9  LDLCALC 38   HgbA1c:  Recent Labs  Lab 08/26/22 1416  HGBA1C 4.3*   Urine Drug Screen: No results for input(s): "LABOPIA", "COCAINSCRNUR", "LABBENZ", "AMPHETMU", "THCU", "LABBARB" in the last 168 hours.  Alcohol Level  Recent Labs  Lab 08/31/2022 1525  ETH <10    IMAGING past 24 hours DG CHEST PORT 1  VIEW  Result Date: 2022/09/05 CLINICAL DATA:  CVC placement, intubation EXAM: PORTABLE CHEST 1 VIEW COMPARISON:  08/26/2022, 11:48 p.m. FINDINGS: Medial bibasilar alveolar consolidation consistent with volume loss or pneumonia. Normal pulmonary vasculature. No pneumothorax. Small pleural effusion on the right. Endotracheal tube tip just below thoracic inlet. Right IJ CVC tip distal SVC. IMPRESSION: Bibasilar consolidation or volume loss. Electronically Signed   By: Sammie Bench M.D.   On: 09-05-22 09:58   DG CHEST PORT 1 VIEW  Result Date: 08/26/2022 CLINICAL DATA:  Short of breath, vomiting EXAM: PORTABLE CHEST 1 VIEW COMPARISON:  08/26/2022 at 5:57 p.m. FINDINGS: Single frontal view of the chest demonstrates a stable cardiac silhouette. Persistent bibasilar opacities, right greater than left. No effusion or pneumothorax. No acute bony abnormalities. IMPRESSION: 1. Stable bibasilar airspace disease, right greater than left. Findings could reflect  asymmetric edema, infection, or aspiration. Electronically Signed   By: Randa Ngo M.D.   On: 08/26/2022 23:57   DG Chest Port 1V same Day  Result Date: 08/26/2022 CLINICAL DATA:  Short of breath EXAM: PORTABLE CHEST 1 VIEW COMPARISON:  08/21/2022 FINDINGS: Normal cardiac silhouette. Bilateral breast implants. Interval increase in bibasilar opacities. Upper lungs clear. No pneumothorax IMPRESSION: Findings suggestive of bilateral pulmonary edema. Pulmonary infection not excluded. New findings from 2 days prior Electronically Signed   By: Suzy Bouchard M.D.   On: 08/26/2022 18:06   MR BRAIN WO CONTRAST  Result Date: 08/26/2022 CLINICAL DATA:  Provided history: Mental status change, unknown cause. EXAM: MRI HEAD WITHOUT CONTRAST TECHNIQUE: Multiplanar, multiecho pulse sequences of the brain and surrounding structures were obtained without intravenous contrast. COMPARISON:  Head CT 08/21/2022.  Brain MRI 03/23/2021. FINDINGS: Brain: The patient was  unable to tolerate the full examination. As result, only axial and coronal diffusion-weighted sequences, and an axial T2 FLAIR sequence could be obtained. The axial T2 FLAIR sequence is severely motion degraded. Moderate to advanced cerebral atrophy with prominence of the ventricles and sulci. 7 mm acute infarct within the right thalamus. Incompletely assessed chronic small vessel ischemic changes within cerebral white matter, which appears at least moderate. Vascular: Flow voids are poorly assessed in the absence of an axial or coronal T2-weighted sequence. Skull and upper cervical spine: No focal suspicious marrow signal abnormality on the acquired sequences. Sinuses/Orbits: The orbits and paranasal sinuses are poorly assessed due to absence of multiple sequences and due to motion degradation. Impression #2 will be called to the ordering clinician or representative by the Radiologist Assistant, and communication documented in the PACS or [employer]. IMPRESSION: The patient could not tolerate the full examination. Only axial and coronal diffusion-weighted imaging, and an axial T2 FLAIR sequence could be obtained. The axial T2 FLAIR sequence is severely motion degraded. 7 mm acute infarct within the right thalamus. Parenchymal atrophy and chronic small vessel ischemic disease, as described. Electronically Signed   By: Kellie Simmering D.O.   On: 08/26/2022 14:08    PHYSICAL EXAM General: Intubated, frail-appearing elderly patient in no acute distress Respiratory: Respirations synchronous with ventilator Neurological: (Sedated with propofol and fentanyl and with recent rocuronium administration) pupils 2 mm, nonreactive to light, doll's eyes reflex absent, no cough and gag, no response to noxious stimuli  ASSESSMENT/PLAN [name] is a 67 y.o. female with history of alcohol associated cirrhosis and hypertension presenting after being found down at home during a wellness check.  She was noted to  be hypothermic with hyponatremia and rhabdomyolysis.  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  This morning, she had several episodes of bloody emesis and was intubated for airway protection.  Prior to intubation, she was reportedly oriented to person and place and was able to communicate well.  Metabolic encephalopathy  Multifactorial due to respiratory failure, severe hyponatremia, rhabdomyolysis, hypothermia, leukocytosis, severe anemia, GI bleeding, transaminitis, AKI, aspiration pneumonia and COVID infection EEG no seizure Management per primary team  Stroke, more likely incidental finding - right thalamic infarct, etiology likely small vessel disease in the setting of hypotension, AKI, dehydration CT head No acute abnormality. Small vessel disease. Atrophy.  CTA head & neck pending MRI 75m acute right thalamic infarct 2D Echo hyperdynamic, EF more than 75% LDL 38 HgbA1c 4.3 UDS pending VTE prophylaxis -SCDs No antithrombotic prior to admission, now on No antithrombotic given severe anemia with GIB. Recommend  to consider ASA once stable from GIB and anemia standpoint.  Therapy recommendations: Pending Disposition: Pending  Respiratory failure Aspiration pneumonia  COVID positive Patient intubated for airway protection Ventilator management per CCM Airborne isolation  History of hypertension now hypotensive Home meds: Lisinopril 40 mg daily, metoprolol 100 mg daily Unstable, requiring norepinephrine Keep MAP greater than 65, still in permissive hypertension window Long-term BP goal normotensive  Lipid management Home meds: None LDL 38, goal < 70 statin not indicated as LDL below goal and transaminitis  GI bleed Patient had 3 episodes of emesis with bright red blood Hemoglobin 5.8, transfusion per primary team Endoscopy to be performed at bedside  Rhabdomyolysis Patient found down at home with unknown downtime CK 7591 on admission, now  1798 Transaminitis AST/ALT 370/116, now 157/57 Management per primary team  Hyponatremia Sodium 116 on admission to now 127 Recommend slow correction of sodium to prevent osmotic demyelination syndrome EEG no seizure  Other Stroke Risk Factors Advanced Age >/= 70  Alcohol abuse - limitation education will be needed later  Other Active Problems Alcohol related cirrhosis-treatment per primary team  Hospital day # [place] , MSN, AGACNP-BC [facility] See Amion for schedule and pager information 09-07-22 12:27 PM  ATTENDING NOTE: I reviewed above note and agree with the assessment and plan. Pt was seen and examined.   67 year old female with history of alcohol abuse, cirrhosis and hypertension admitted for # home, severe hyponatremia, rhabdomyolysis, hypothermia, AKI, leukocytosis, transaminitis.  CT no acute abnormality.  Patient also found to have left leaning which triggered MRI done showed right thalamic small infarct.  Ventriculomegaly is stable.  EF more than 75%, LDL 38, A1c 4.3, CT head and neck pending, EEG no seizure.  UDS pending.  On exam, son at the bedside, pt is intubated on sedation, eyes half way open,  following commands. With forced eye opening, eyes in mid position, not blinking to visual threat, doll's eyes absent, not tracking, pupils equal 2.72m, sluggish to light. Corneal reflex absent, gag and cough weakly present. Breathing over the vent.  Facial symmetry not able to test due to ET tube.  Tongue protrusion not cooperative. On pain stimulation, LUE and BLEs mild withdraw to pain. Sensation, coordination and gait not tested.  Etiology for patient's stroke likely due to small vessel disease in the setting of AKI, dehydration, hypotension, complication from patient's overall medical condition.  No aspirin for now given GI bleeding and severe anemia.  May consider aspirin once stable from GI and anemia standpoint.  No statin needed given  transaminitis and low LDL level.   Primary team also treating patient for transaminitis, severe anemia needing blood transfusion, GI bleeding, respiratory failure needing intubation, leukocytosis, COVID infection, aspiration pneumonia, tachycardia, hypotension, severe hyponatremia, hypothermia. Will follow.  For detailed assessment and plan, please refer to above/below as I have made changes wherever appropriate.   JRosalin Hawking MD PhD Stroke Neurology [id number] PM  This patient is critically ill due to severe metabolic encephalopathy, AKI, respiratory failure, rhabdomyolysis, hyponatremia, hypothermia, GI bleeding, severe anemia, stroke and at significant risk of neurological worsening, death form sepsis, renal failure, recurrent stroke, shock. This patient's care requires constant monitoring of vital signs, hemodynamics, respiratory and cardiac monitoring, review of multiple databases, neurological assessment, discussion with family, other specialists and medical decision making of high complexity. I spent 40 minutes of neurocritical care time in the care of this patient. I had long discussion with patient's son at bedside,  updated pt current condition, treatment plan and potential prognosis, and answered all the questions.  He expressed understanding and appreciation.      To contact Stroke Continuity provider, please refer to [email or web address] After hours, contact General Neurology

## 2022-09-08 NOTE — Consult Note (Signed)
Referring Provider: Dr. Oren Binet Primary Care Physician:  Carollee Herter, Alferd Apa, DO Primary Gastroenterologist:  Dr. Owens Loffler  Reason for Consultation: Hematemesis, upper GI bleed  HPI: [name] is a 67 y.o. female with a past medical history of anxiety, depression, hypertension, TIA, osteopenia and alcohol associated cirrhosis.  She presented to the ED 08/11/2022 with altered mental status. Her PCP called for a welfare check as there was concern the patient was unable to safely perform activities of daily living.  She was found down on the floor by the arriving police officer who reported she was cold with mottled lower extremities. She was hypothermic and hypotensive and was placed on a Bair hugger and received aggressive IV fluid resuscitation the ED.   Labs in the ED showed a WBC count of 13.1.  Hemoglobin 10.9 (Hg 13.2 on 07/28/2022).  Hematocrit 29.2.  MCV 102.5.  Platelet 247.  Sodium 116.  Potassium 4.9.  Chloride 77.  CO2 17.  Glucose 106.  Creatinine 1.32.  BUN 52.  Total bili 2.0.  Alk phos 62.  AST 363.  ALT 142. Lactic acid 4.1 -> 7.2 -> 2.5 -> 1.9. Ammonia 15.  INR 1.4.  CK 7,591.  Lipase 57.  HIV nonreactive.  Ethyl Alcohol < 10. Blood cultures x 2 collected.  Moderate hemoglobin level.  Head CT negative for acute intracranial process.  CT cervical spine negative for acute fracture. Chest x-ray was negative.  RUQ sonogram showed hepatic steatosis with upper normal to slightly enlarged CBD considered stable when compared to prior MRI.  Yesterday, she became more lethargic, tachycardic and hypotensive with evidence of pulmonary edema.  She received Lasix, albumin and midodrine. Brain MRI showed a 7 mm acute infarct within the right thalamus and parenchymal atrophy and chronic vessel ischemic disease. During the night, she developed bright red hematemesis x 2 to 3 episodes of hypotension and she was transferred to the ICU.  GI consult was requested for further  evaluation regarding UGI bleed.  RN reported she vomited bright red blood x 3 this morning.  Rectal bleeding or melena.  Octreotide and PPI infusion was initiated.  She is in acute distress and the critical care team is preparing for intubation. Type and cross.she has not received a blood transfusion at this point.  No family at the bedside.  It is unclear when she last consumed any alcohol.  Labs today: WBC 10.2.  Hemoglobin 7.9 -> 7.7.  MCV 104.8.  Platelet 197.  Sodium 124.  Creatinine 0.83.  Total bili 2.0.  Alk phos 37.  AST 157.  ALT 57.  CK 1798.  Troponin 68 -> 67.  Sars coronavirus 2 positive.  Influenza A & B negative.  And are pending.  She was last seen in our outpatient GI office for cirrhosis follow-up 03/30/2022.  At that time, her MELD score was 6 and she was drinking approximately 2 beers weekly.  Judithann Graves, her clinical status was stable at that time with recommendations for follow-up in 6 months.  PAST GI PROCEDURES:  ERCP 07/28/2021 EtOH cirrhosis with dilated biliary tree MRI: Dilated proximal extrahepatic biliary tree, this is likely from small amount of sludge which was removed by biliary sphincterotomy and balloon sweeping but she may have had a hypertrophic biliary sphincter  EGD 07/28/2020: Mild nonspecific gastritis biopsied to check for H. pylori Examination was otherwise normal No signs of portal hypertension Surgical [P], gastric antrum and gastric body - REACTIVE GASTROPATHY. Hinton Dyer IS NEGATIVE FOR HELICOBACTER  PYLORI. - NO INTESTINAL METAPLASIA, DYSPLASIA, OR Christine  Colonoscopy 04/26/2018: Diverticulosis in the left colon The examination was otherwise normal on direct and retroflexion views No polyps or cancers Repeat colonoscopy 10 years  Past Medical History:  Diagnosis Date   Anxiety    Cancer ([place]) 04/2014   squamous cell carcinoma L inner thigh   Depression    Hard of hearing    Hypertension    Osteopenia    Substance abuse ([place])    TIA  (transient ischemic attack) 2016    Past Surgical History:  Procedure Laterality Date   AUGMENTATION MAMMAPLASTY Bilateral 1988   BREAST ENHANCEMENT SURGERY     BUNIONECTOMY     ENDOSCOPIC RETROGRADE CHOLANGIOPANCREATOGRAPHY (ERCP) WITH PROPOFOL N/A 07/28/2021   Procedure: ENDOSCOPIC RETROGRADE CHOLANGIOPANCREATOGRAPHY (ERCP) WITH PROPOFOL;  Surgeon: Milus Banister, MD;  Location: [facility] END[facility]COPY;  Service: Endoscopy;  Laterality: N/A;   GUM SURGERY  11/2017   NOSE SURGERY     Epistaxis   REMOVAL OF STONES  07/28/2021   Procedure: REMOVAL OF STONES;  Surgeon: Milus Banister, MD;  Location: [facility] ENDOS[facility];  Service: Endoscopy;;   SPHINCTEROTOMY  07/28/2021   Procedure: Joan Mayans;  Surgeon: Milus Banister, MD;  Location: [facility];  Service: Endoscopy;;   TONSILLECTOMY      Prior to Admission medications   Medication Sig Start Date End Date Taking? Authorizing Provider  lisinopril (ZESTRIL) 40 MG tablet TAKE 1 TABLET BY MOUTH EVERY DAY 07/28/22  Yes Lowne Chase, Yvonne R, DO  melatonin 5 MG TABS Take 1 tablet (5 mg total) by mouth at bedtime as needed. 04/08/21  Yes Antonieta Pert, MD  metoprolol succinate (TOPROL-XL) 100 MG 24 hr tablet Take 1 tablet (100 mg total) by mouth daily. Take with or immediately following a meal 06/26/22  Yes Roma Schanz R, DO    Current Facility-Administered Medications  Medication Dose Route Frequency Provider Last Rate Last Admin    stroke: early stages of recovery book   Does not apply Once Jonetta Osgood, MD       0.9 %  sodium chloride infusion (Manually program via Guardrails IV Fluids)   Intravenous Once Collier Bullock, MD       0.9 %  sodium chloride infusion   Intravenous Continuous Jonetta Osgood, MD 75 mL/hr at 08/26/22 1227 New Bag at 08/26/22 1227   albumin human 25 % solution 12.5 g  12.5 g Intravenous Once Collier Bullock, MD       Ampicillin-Sulbactam (UNASYN) 3 g in sodium chloride 0.9 % 100 mL IVPB  3 g Intravenous Q6H  Bryk, Veronda P, RPH 200 mL/hr at 08-30-2022 0147 3 g at 08-30-2022 0147   Chlorhexidine Gluconate Cloth 2 % PADS 6 each  6 each Topical Daily Mannam, Praveen, MD       folic acid (FOLVITE) tablet 1 mg  1 mg Oral Daily Zada Finders R, MD   1 mg at 08/26/22 0914   furosemide (LASIX) injection 40 mg  40 mg Intravenous Once Collier Bullock, MD       LORazepam (ATIVAN) tablet 1-4 mg  1-4 mg Oral Q1H PRN Zada Finders R, MD   1 mg at 08/25/22 2342   midodrine (PROAMATINE) tablet 5 mg  5 mg Oral TID WC Jonetta Osgood, MD   5 mg at 08/26/22 1850   multivitamin with minerals tablet 1 tablet  1 tablet Oral Daily Lenore Cordia, MD   1 tablet at 08/26/22 [phone]  octreotide (SANDOSTATIN) 500 mcg in sodium chloride 0.9 % 250 mL (2 mcg/mL) infusion  50 mcg/hr Intravenous Continuous Collier Bullock, MD 25 mL/hr at 09/26/2022 0543 50 mcg/hr at 09-26-2022 0543   ondansetron (ZOFRAN) tablet 4 mg  4 mg Oral Q6H PRN Lenore Cordia, MD       Or   ondansetron [facility]) injection 4 mg  4 mg Intravenous Q6H PRN Lenore Cordia, MD       Derrill Memo ON 08/30/2022] pantoprazole (PROTONIX) injection 40 mg  40 mg Intravenous Q12H Shela Leff, MD       pantoprozole (PROTONIX) 80 mg /NS 100 mL infusion  8 mg/hr Intravenous Continuous Shela Leff, MD 10 mL/hr at 09/26/2022 0233 8 mg/hr at September 26, 2022 0233   senna-docusate (Senokot-S) tablet 1 tablet  1 tablet Oral QHS PRN Lenore Cordia, MD       sodium chloride flush (NS) 0.9 % injection 3 mL  3 mL Intravenous Q12H Zada Finders R, MD   3 mL at 08/26/22 2103   thiamine (VITAMIN B1) tablet 100 mg  100 mg Oral Daily Zada Finders R, MD   100 mg at 08/26/22 [id number]   Or   thiamine (VITAMIN B1) injection 100 mg  100 mg Intravenous Daily Lenore Cordia, MD        Allergies as of 08/17/2022 - Review Complete 08/29/2022  Allergen Reaction Noted   Codeine Other (See Comments) 10/22/2007    Family History  Problem Relation Age of Onset   Hypertension Mother    Kidney disease  Mother        dialysis   Alzheimer's disease Mother    Hyperlipidemia Father    Stroke Paternal Grandmother    Rheum arthritis Paternal Grandmother    Breast cancer Neg Hx    Colon cancer Neg Hx    Esophageal cancer Neg Hx    Rectal cancer Neg Hx    Stomach cancer Neg Hx     Social History   Socioeconomic History   Marital status: Divorced    Spouse name: Not on file   Number of children: 0   Years of education: 12   Highest education level: 12th grade  Occupational History   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  Tobacco Use   Smoking status: Never    Passive exposure: Never   Smokeless tobacco: Never  Vaping Use   Vaping Use: Never used  Substance and Sexual Activity   Alcohol use: Not Currently    Alcohol/week: 4.0 - 6.0 standard drinks of alcohol    Types: 4 - 6 Cans of beer per week    Comment: 3 times a week, states hx of heavy use 15 years ago   Drug use: No   Sexual activity: Not Currently    Partners: Male  Other Topics Concern   Not on file  Social History Narrative   Exercise--- gym 6x a week   Social Determinants of Health   Financial Resource Strain: Low Risk  (05/16/2021)   Overall Financial Resource Strain (CARDIA)    Difficulty of Paying Living Expenses: Not hard at all  Food Insecurity: No Food Insecurity (05/16/2021)   Hunger Vital Sign    Worried About Running Out of Food in the Last Year: Never true    Ran Out of Food in the Last Year: Never true  Transportation Needs: No Transportation Needs (05/16/2021)   PRAPARE - Transportation    Lack of Transportation (Medical): No    Lack of  Transportation (Non-Medical): No  Physical Activity: Sufficiently Active (05/16/2021)   Exercise Vital Sign    Days of Exercise per Week: 6 days    Minutes of Exercise per Session: 50 min  Stress: Stress Concern Present (05/16/2021)   [place]    Feeling of Stress : To some extent  Social  Connections: Moderately Integrated (05/16/2021)   Social Connection and Isolation Panel [NHANES]    Frequency of Communication with Friends and Family: More than three times a week    Frequency of Social Gatherings with Friends and Family: More than three times a week    Attends Religious Services: More than 4 times per year    Active Member of [employer] or Organizations: Yes    Attends Archivist Meetings: 1 to 4 times per year    Marital Status: Divorced  Human resources officer Violence: Not At Risk (05/16/2021)   Humiliation, Afraid, Rape, and Kick questionnaire    Fear of Current or Ex-Partner: No    Emotionally Abused: No    Physically Abused: No    Sexually Abused: No    Review of Systems: Unable to complete review of systems as patient critically ill in process of being intubated.  Physical Exam: Vital signs in last 24 hours: Temp:  [97.6 F (36.4 C)-99.3 F (37.4 C)] 97.6 F (36.4 C) 09-19-22 0641) Pulse Rate:  [106-126] 118 09-19-2022 0650) Resp:  [20-35] 30 2022-09-19 0650) BP: (87-116)/(64-82) 87/64 09/19/22 0645) SpO2:  [67 %-100 %] 94 % 09/19/22 0650) FiO2 (%):  [100 %] 100 % 09-19-2022 0630)   General: Critically ill 67 year old female. Head:  Normocephalic and atraumatic. Eyes:  No scleral icterus. Conjunctiva pink. Ears:  Normal auditory acuity. Nose:  No deformity, discharge or lesions. Mouth: No ulcers or lesions.  Neck:  Supple. No lymphadenopathy or thyromegaly.  Lungs: Sounds clear to coarse throughout.  No wheezes or rhonchi. Heart: Tachycardic, no murmurs. Abdomen: Soft.  Nondistended.  No obvious signs of tenderness.  Overt rebound or guarding.  Hypoactive bowel sounds to all 4 quadrants.  No ascites. Rectal: Deferred. Musculoskeletal:  Symmetrical without gross deformities.  Pulses:  Normal pulses noted. Extremities:  Without clubbing or edema. Neurologic: Patient opens eyes name called.  Squeezes hands bilaterally command.  Nonconversant. Skin:  Intact without  significant lesions or rashes.  Intake/Output from previous day: 02/17 0701 - 09-19-2022 0700 In: 104.5 [I.V.:3; IV Piggyback:101.5] Out: 280 [Urine:280] Intake/Output this shift: No intake/output data recorded.  Lab Results: Recent Labs    08/25/22 0252 08/26/22 0444 September 19, 2022 0200 2022/09/19 0439  WBC 11.8* 7.2 10.2  --   HGB 10.1* 10.5* 7.9* 7.7*  HCT 27.8* 29.3* 21.9* 20.9*  PLT 269 203 197  --    BMET Recent Labs    08/26/22 1048 08/26/22 1756 September 19, 2022 0200  NA 120* 121* 124*  K 3.4* 3.3* 3.6  CL 88* 88* 90*  CO2 17* 17* 19*  GLUCOSE 127* 98 104*  BUN 21 20 20  $ CREATININE 0.90 0.78 0.83  CALCIUM 7.7* 7.7* 7.9*   LFT Recent Labs    19-Sep-2022 0200  PROT 5.4*  ALBUMIN 3.9  AST 157*  ALT 57*  ALKPHOS 37*  BILITOT 2.0*   PT/INR Recent Labs    08/19/2022 1525 08/25/22 0252  LABPROT 16.6* 16.2*  INR 1.4* 1.3*   Hepatitis Panel No results for input(s): "HEPBSAG", "HCVAB", "HEPAIGM", "HEPBIGM" in the last 72 hours.    Studies/Results: DG CHEST PORT 1 VIEW  Result Date: 08/26/2022 CLINICAL DATA:  Short of breath, vomiting EXAM: PORTABLE CHEST 1 VIEW COMPARISON:  08/26/2022 at 5:57 p.m. FINDINGS: Single frontal view of the chest demonstrates a stable cardiac silhouette. Persistent bibasilar opacities, right greater than left. No effusion or pneumothorax. No acute bony abnormalities. IMPRESSION: 1. Stable bibasilar airspace disease, right greater than left. Findings could reflect asymmetric edema, infection, or aspiration. Electronically Signed   By: Randa Ngo M.D.   On: 08/26/2022 23:57   DG Chest Port 1V same Day  Result Date: 08/26/2022 CLINICAL DATA:  Short of breath EXAM: PORTABLE CHEST 1 VIEW COMPARISON:  08/31/2022 FINDINGS: Normal cardiac silhouette. Bilateral breast implants. Interval increase in bibasilar opacities. Upper lungs clear. No pneumothorax IMPRESSION: Findings suggestive of bilateral pulmonary edema. Pulmonary infection not excluded. New  findings from 2 days prior Electronically Signed   By: Suzy Bouchard M.D.   On: 08/26/2022 18:06   MR BRAIN WO CONTRAST  Result Date: 08/26/2022 CLINICAL DATA:  Provided history: Mental status change, unknown cause. EXAM: MRI HEAD WITHOUT CONTRAST TECHNIQUE: Multiplanar, multiecho pulse sequences of the brain and surrounding structures were obtained without intravenous contrast. COMPARISON:  Head CT 09/06/2022.  Brain MRI 03/23/2021. FINDINGS: Brain: The patient was unable to tolerate the full examination. As result, only axial and coronal diffusion-weighted sequences, and an axial T2 FLAIR sequence could be obtained. The axial T2 FLAIR sequence is severely motion degraded. Moderate to advanced cerebral atrophy with prominence of the ventricles and sulci. 7 mm acute infarct within the right thalamus. Incompletely assessed chronic small vessel ischemic changes within cerebral white matter, which appears at least moderate. Vascular: Flow voids are poorly assessed in the absence of an axial or coronal T2-weighted sequence. Skull and upper cervical spine: No focal suspicious marrow signal abnormality on the acquired sequences. Sinuses/Orbits: The orbits and paranasal sinuses are poorly assessed due to absence of multiple sequences and due to motion degradation. Impression #2 will be called to the ordering clinician or representative by the Radiologist Assistant, and communication documented in the PACS or [employer]. IMPRESSION: The patient could not tolerate the full examination. Only axial and coronal diffusion-weighted imaging, and an axial T2 FLAIR sequence could be obtained. The axial T2 FLAIR sequence is severely motion degraded. 7 mm acute infarct within the right thalamus. Parenchymal atrophy and chronic small vessel ischemic disease, as described. Electronically Signed   By: Kellie Simmering D.O.   On: 08/26/2022 14:08    IMPRESSION/PLAN:  67 year old female admitted to the hospital 08/25/2022 after  PCP called wellness check and she was found down on the floor.  Hypotensive and hypothermic.  Lactic acidosis and hyponatremia. -Management per the medical critical care service  Hematemesis/UGI bleed with anemia. Hg 10.9 -> 8.2 -> 10.1 -> 10.5 -> today Hg 7.9 -> 7.7. -NPO -Continue PPI infusion -Continue octreotide infusion -Bedside EGD by Dr. Candis Schatz today, after patient intubated -Transfuse for hemoglobin less than 7 and as needed for hemorrhagic shock  Alcohol associated cirrhosis. Elevated LFTS, downtrending.   Small acute infarct within the right thalamus and parenchymal atrophy and chronic vessel ischemic disease.   SARS coronavirus positive.  Negative chest x-ray.   Patrecia Pour Kennedy-Smith  2022/09/18, 8:40AM

## 2022-09-08 NOTE — Procedures (Signed)
Intubation Procedure Note  [name]  [id number]  08-26-1955  Date:09-20-22  Time:9:25 AM   Provider Performing:Catrice Zuleta    Procedure: Intubation (31500)  Indication(s) Respiratory Failure  Consent Risks of the procedure as well as the alternatives and risks of each were explained to the patient and/or caregiver.  Consent for the procedure was obtained and is signed in the bedside chart   Anesthesia Etomidate, Fentanyl, and Rocuronium   Time Out Verified patient identification, verified procedure, site/side was marked, verified correct patient position, special equipment/implants available, medications/allergies/relevant history reviewed, required imaging and test results available.   Sterile Technique Usual hand hygeine, masks, and gloves were used   Procedure Description Patient positioned in bed supine.  Sedation given as noted above.  Patient was intubated with endotracheal tube using Glidescope.  View was Grade 1 full glottis .  Number of attempts was 1.  Colorimetric CO2 detector was consistent with tracheal placement.   Complications/Tolerance None; patient tolerated the procedure well. Chest X-ray is ordered to verify placement.   EBL Minimal   Specimen(s) None  Marshell Garfinkel MD [facility] See Amion for pager  If no response to pager , please call [phone] until 7pm After 7:00 pm call Elink  [id number] Sep 20, 2022, 9:25 AM

## 2022-09-08 NOTE — Procedures (Signed)
Routine EEG Report  [name] is a 67 y.o. female with a history of altered mental status who is undergoing an EEG to evaluate for seizures.  Report: This EEG was acquired with electrodes placed according to the International 10-20 electrode system (including Fp1, Fp2, F3, F4, C3, C4, P3, P4, O1, O2, T3, T4, T5, T6, A1, A2, Fz, Cz, Pz). The following electrodes were missing or displaced: none.  The occipital dominant rhythm was 6-7 Hz. This activity is reactive to stimulation. Drowsiness was manifested by background fragmentation; deeper stages of sleep were identified by K complexes and sleep spindles. There was no focal slowing. There were no interictal epileptiform discharges. There were no electrographic seizures identified. There was no abnormal response to photic stimulation. Hyperventilation was not performed.   Impression and clinical correlation: This EEG was obtained while awake and asleep and is abnormal due to mild diffuse slowing indicative of global cerebral dysfunction. Epileptiform abnormalities were not seen during this recording.  Su Monks, MD [facility] [phone]  If 7pm- 7am, please page neurology on call as listed in [place].

## 2022-09-08 NOTE — Procedures (Signed)
Bronchoscopy Procedure Note  [name]  [id number]  [birth year]  Date:08/29/2022  Time:9:49 AM   Provider Performing:Alcee Sipos   Procedure(s):  Flexible bronchoscopy with bronchial alveolar lavage [id number])  Indication(s) Aspiration  Consent Unable to obtain consent due to emergent nature of procedure.  Anesthesia Etomidate   Time Out Verified patient identification, verified procedure, site/side was marked, verified correct patient position, special equipment/implants available, medications/allergies/relevant history reviewed, required imaging and test results available.   Sterile Technique Usual hand hygiene, masks, gowns, and gloves were used   Procedure Description Bronchoscope advanced through endotracheal tube and into airway.  Airways were examined down to subsegmental level with findings noted below.   Following diagnostic evaluation, BAL(s) performed in RML with normal saline and return of bloody fluid  Findings: Blood clots in the trachea and lower lobes which were cleared   Complications/Tolerance None; patient tolerated the procedure well. Chest X-ray is needed post procedure.   EBL Minimal   Specimen(s) Bronchoalveolar lavage  Marshell Garfinkel MD [place] [facility] [facility] See Amion for pager  If no response to pager , please call [phone] until 7pm After 7:00 pm call Elink  [id number] 08/29/22, 9:50 AM

## 2022-09-08 NOTE — Progress Notes (Addendum)
Overnight event  Notified by RN that patient was coughing and then had an episode of vomiting after which her oxygen saturation dropped to the 80s and she was placed on nonrebreather.  Now satting in the 90s on 8 L [place].  Temperature 98 F, heart rate 106, respiratory rate 22, blood pressure 94/73.  Chart reviewed.  Briefly, 67 year old female with history of alcoholic liver cirrhosis, pancreatic cyst, hypertension admitted for acute metabolic encephalopathy, hyponatremia, rhabdomyolysis, AKI.  She was started on IV fluids.  This afternoon patient was lethargic and day physician had ordered a chest x-ray which was concerning for pulmonary edema. Previous echo done in 2016 showing EF 55 to [id number], grade 2 diastolic dysfunction.   IV fluids were held and patient was given IV Lasix 20 mg along with albumin and midodrine due to low blood pressure.  Repeat echocardiogram ordered.  Repeat chest x-ray done tonight showing stable bibasilar airspace disease, right greater than left.  Findings could reflect asymmetric edema, infection, or aspiration.  Will start Unasyn due to concern for aspiration pneumonia.  Keep n.p.o. at this time, aspiration precautions, SLP eval.  Check BNP and procalcitonin.  Test for COVID/flu/RSV.  Continue supplemental oxygen, wean as tolerated.  Monitor closely.  Addendum 2/80/2024 at 1:11 [id number] had another episode of vomiting (large-volume coffee-ground emesis).  Tachycardic to the 110s.  Blood pressure slightly improved at 116/88.  Satting above 90% on 10 L [place], respiratory rate in the 20s.  EGD done January 2022 showing mild gastritis and no signs of portal hypertension.  -Hold aspirin and subcutaneous heparin -Urgent CBC -IV Protonix bolus and infusion -Critical care consulted

## 2022-09-08 NOTE — Procedures (Signed)
Arterial Catheter Insertion Procedure Note  CLAIRE WINEBRENNER  [id number]  [birth year]  Date:29-Aug-2022  Time:4:04 PM    Provider Performing: Marshell Garfinkel    Procedure: Insertion of Arterial Line [phone]) with US guidance [id number])   Indication(s) Blood pressure monitoring and/or need for frequent ABGs  Consent Risks of the procedure as well as the alternatives and risks of each were explained to the patient and/or caregiver.  Consent for the procedure was obtained and is signed in the bedside chart  Anesthesia None   Time Out Verified patient identification, verified procedure, site/side was marked, verified correct patient position, special equipment/implants available, medications/allergies/relevant history reviewed, required imaging and test results available.   Sterile Technique Maximal sterile technique including full sterile barrier drape, hand hygiene, sterile gown, sterile gloves, mask, hair covering, sterile ultrasound probe cover (if used).   Procedure Description Area of catheter insertion was cleaned with chlorhexidine and draped in sterile fashion. With real-time ultrasound guidance an arterial catheter was placed into the left  axillary  artery.  Appropriate arterial tracings confirmed on monitor.     Complications/Tolerance None; patient tolerated the procedure well.   EBL Minimal   Specimen(s) None   Marshell Garfinkel MD [facility] See Amion for pager  If no response to pager , please call [phone] until 7pm After 7:00 pm call Elink  [id number] 2022/08/29, 4:04 PM

## 2022-09-08 NOTE — Progress Notes (Signed)
Pharmacy Antibiotic Note  [name] is a 67 y.o. female admitted on 09/01/2022 after being found down at home, now with concern for aspiration pneumonia.  Pharmacy has been consulted for Unasyn dosing.  Plan: Unasyn 3g IV Q6H.  Height: 5' (152.4 cm) Weight: 48.8 kg (107 lb 9.4 oz) IBW/kg (Calculated) : 45.5  Temp (24hrs), Avg:98.5 F (36.9 C), Min:97.9 F (36.6 C), Max:99.3 F (37.4 C)  Recent Labs  Lab 08/29/2022 1425 08/23/2022 1525 08/23/2022 1525 09/01/2022 1658 08/30/2022 1915 08/26/2022 2149 08/25/22 0252 08/25/22 1711 08/26/22 0048 08/26/22 0444 08/26/22 1048 08/26/22 1756  WBC  --  13.1*  --   --   --   --  11.8*  --   --  7.2  --   --   CREATININE  --  1.32*   < >  --  1.10*  --  1.04* 0.94 0.81 0.84 0.90 0.78  LATICACIDVEN 4.1*  --   --  7.2* 2.5* 1.9  --   --   --   --   --   --    < > = values in this interval not displayed.    Estimated Creatinine Clearance: 50.4 mL/min (by C-G formula based on SCr of 0.78 mg/dL).    Allergies  Allergen Reactions   Codeine Other (See Comments)    REACTION: insomnia and anxious    Thank you for allowing pharmacy to be a part of this patient's care.  Wynona Neat, PharmD, BCPS  2022-09-26 12:54 AM

## 2022-09-08 NOTE — Procedures (Signed)
Extubation Procedure Note  Patient Details:   Name: [name] DOB: 27-Nov-1955 MRN: [id number]   Airway Documentation:    Vent end date: September 21, 2022 Vent end time: [id number]   Pt extubated per Withdrawal of Life Protocol  Jesse Sans 09-21-2022, 5:48 PM

## 2022-09-08 NOTE — IPAL (Signed)
  Interdisciplinary Goals of Care Family Meeting   Date carried out: 08/26/2022  Location of the meeting: Bedside  Member's involved: Physician, Bedside Registered Nurse, and Family Member or next of kin  Durable Power of Attorney or acting medical decision maker: kids (friend on phone has deferred to children)    Discussion: We discussed goals of care for [name] .   Discussed profound decline over past 24h, obvious suffering and what [name] would have wanted.  All agree she would like a peaceful passing off life support.  Code status:   Code Status: DNR   Disposition: In-patient comfort care  Time spent for the meeting: N/A    Candee Furbish, MD  08/16/2022, 4:40 PM

## 2022-09-08 NOTE — Progress Notes (Addendum)
NAME:  [name], MRN:  [id number], DOB:  August 11, 1955, LOS: 3 ADMISSION DATE:  08/28/2022, CONSULTATION DATE:  08/26/22  REFERRING MD:  Marlowe Sax , CHIEF COMPLAINT:  vomiting, coffee ground emesis   History of Present Illness:   [name] is a 67 yo woman with a hx of Depression, anxiety, etoh use, cirrhosis, HTN,  found on floor of her home with door open during welfare check, possibly ams vs A and O x 2, cold with mottled extremities.  In ED, experiencing back pain and some abdominal pain.   Hypotension and hypothermia noted.  Found to have lactic acidosis, and hyponatremia of 116.  Drank beer on 2/14, possible fall but patient does not remember.   Mental status and bp improved with fluids and warming.  Found to have rhabdomyolysis, elevated LFTs.  Mild AKI. Home health notes from 08/23/22 - patient unable to mobilize/unable to care for self.   Pertinent  Medical History  Anxiety/Depression Squamous cell carcinoma L inner thigh  HTN  Hx TIA  Etoh use  Cirrhosis  Pancreatic cysts   Home meds:  Lisinopril  Melatonin Metoprolol  Doxcycycline Folate, mag, MVI   Significant Hospital Events: Including procedures, antibiotic start and stop dates in addition to other pertinent events   2/17 Admit. Noticed L lean while sitting and standing, MRI ordered. EEG. Later in the day became slightly more lethargic.  More tachy, mild hypotension, pulm edema. Fluids held.  Lasix + albumin, midodrine.  September 24, 2022 developed coffee-ground emesis.  Transferred to ICU  Interim History / Subjective:   Developed significant bloody emesis with altered mental status, inability to protect airway due to aspiration.  GI consult called and patient intubated, central line placed  Objective   Blood pressure (!) 86/61, pulse (!) 120, temperature 97.6 F (36.4 C), temperature source Oral, resp. rate (!) 31, height 5' (1.524 m), weight 48.8 kg, SpO2 100 %.    FiO2 (%):  [100 %] 100 %   Intake/Output  Summary (Last 24 hours) at 09-24-2022 0930 Last data filed at September 24, 2022 0400 Gross per 24 hour  Intake 101.53 ml  Output 280 ml  Net -178.47 ml   Filed Weights   08/26/2022 1531 08/26/22 0450  Weight: 52.2 kg 48.8 kg    Examination: Blood pressure (!) 86/61, pulse (!) 120, temperature 97.6 F (36.4 C), temperature source Oral, resp. rate (!) 31, height 5' (1.524 m), weight 48.8 kg, SpO2 100 %. Gen:      No acute distress HEENT:  EOMI, sclera anicteric Neck:     No masses; no thyromegaly Lungs:    Clear to auscultation bilaterally; normal respiratory effort CV:         Regular rate and rhythm; no murmurs Abd:      + bowel sounds; soft, non-tender; no palpable masses, no distension Ext:    No edema; adequate peripheral perfusion Skin:      Warm and dry; no rash Neuro: Sedated, unresponsive  Labs/imaging reviewed Significant for sodium 124, BUN/creatinine 20/0.83, AST 157, ALT 57, BNP 171 WBC 10.2, hemoglobin 7.9, platelets 197  MRI brain on 2/17 reviewed with 7 mm right thalamic infarct.  Resolved Hospital Problem list     Assessment & Plan:  GI bleed, aspiration EtOH cirrhosis and elevated LFTs GI consulted Continue Protonix and octreotide drips.  Monitor CBC. On Levophed which we will try to wean off.  Continue midodrine Hold aspirin, Plavix  Acute hypoxic respiratory failure in the setting of GI bleed COVID-positive.  May  be incidental finding as chest x-ray on admission was clear Intubated for airway protection.  Bronchoscopy performed with removal of blood clots from trachea and lower lobes Continue Unasyn for aspiration.  Follow BAL cultures  Acute metabolic encephalopathy 7 mm thalamic infarct Neurology is on board stroke workup is underway Will start lactulose after EGD High-dose thiamine for possible Warnicke's encephalopathy  Hyponatremia, rhabdomyolysis Continue IV fluid resuscitation.  Follow labs  AKI Monitor urine output and creatinine  Goals of  care Discussed with friend friend Susie [legal POA] and [name] [healthcare POA].  She has 2 sons who are estranged and they cannot get in touch with them.  There is no advanced directive on record.  She will be full code for now.  Best Practice (right click and "Reselect all SmartList Selections" daily)   Diet/type: NPO DVT prophylaxis: SCD GI prophylaxis: PPI Lines: Central line Foley:  N/A Code Status:  full code Last date of multidisciplinary goals of care discussion 09/25/22. See above]  Critical care time:    The patient is critically ill with multiple organ system failure and requires high complexity decision making for assessment and support, frequent evaluation and titration of therapies, advanced monitoring, review of radiographic studies and interpretation of complex data.   Critical Care Time devoted to patient care services, exclusive of separately billable procedures, described in this note is 35  minutes.   Marshell Garfinkel MD [facility] See Amion for pager  If no response to pager , please call [phone] until 7pm After 7:00 pm call Elink  [phone] 09/25/2022, 9:31 AM    .pccm

## 2022-09-08 NOTE — Progress Notes (Signed)
Patient had a cough and small amount of coffee ground vomiting. Spo2 dropped down to 80s Informed RT and RT put her on 8 L on HFNC salter. Spo2 maintained at 93% Notified MD. Chest Xray ordered.

## 2022-09-08 NOTE — Progress Notes (Signed)
EGD performed at bedside, patient intubated and sedated. ICU RN at bedside throughout. Sedation and vital signs monitored by ICU RN.

## 2022-09-08 NOTE — Consult Note (Addendum)
NAME:  [name], MRN:  [id number], DOB:  [birth year], LOS: 3 ADMISSION DATE:  09/05/2022, CONSULTATION DATE:  08/26/22  REFERRING MD:  [name] , CHIEF COMPLAINT:  vomiting, coffee ground emesis   History of Present Illness:  Ms. [name] is a 67 yo woman with a hx of Depression, anxiety, etoh use, cirrhosis, HTN,  found on floor of her home with door open during welfare check, possibly ams vs A and O x 2, cold with mottled extremities.  In ED, experiencing back pain and some abdominal pain.   Hypotension and hypothermia noted.  Found to have lactic acidosis, and hyponatremia of 116.  Drank beer on 2/14, possible fall but patient does not remember.   Mental status and bp improved with fluids and warming.  Found to have rhabdomyolysis, elevated LFTs.  Mild AKI.   Home health notes from 08/23/22 - patient unable to mobilize/unable to care for self.   2/17 - dr. Othelia Skinner L lean while sitting and standing, MRI ordered. EEG.  Later in the day became slightly more lethargic.  More tachy, mild hypotension, pulm edema. Fluids held.  Lasix + albumin, midodrine.  Tonight had cough with coffee ground emesis.  Sat temporarily dropped.  Sat improved with 8L and salter.  Started on Unasyn.   Additional episode of coffee ground emesis.    Old egd done 1/22 showed only mild gastritis.   Protonix started.   Review vitals: has been HR 100s-110s since 7am 2/17. Satting 90s throughout day, requiring [place] o2 since [id number].  BP 0000000 systolic map [id number] since [id number] 2/17.   I/O -377 since am shift.  Doesn't look like IV maint fluids recorded on 2/16 day, 2.1 L UOP.   2 L given in ED on arrival 2/15.  Asa 325 given 2/17, heparin PPX given, both stopped now.  Midodrine started this afternoon, albumin 25 % .   Na 121 K 3.3 (40Meq K given) Co2 17 Ast/ALT mild elevated on admission  CPK 6090 on admission  No uds  CT neck : patulous esophagus  CXR: IMPRESSION: 1. Stable bibasilar airspace disease, right greater  than left. Findings could reflect asymmetric edema, infection, or aspiration.  (Improved from earlier cxr this afternoon 2/17)  MRI 2/17   7 mm acute infarct within the right thalamus.   Parenchymal atrophy and chronic small vessel ischemic disease, as described.  08/2013 Abd [place] MPRESSION: 1. Echogenic liver parenchyma consistent with hepatic steatosis and or hepatocellular disease. 2. Upper normal to slightly enlarged common bile duct but stable to slightly decreased compared to MRI from January  Echo 12/16 The cavity size was normal. Wall thickness was    increased in a pattern of mild LVH. Systolic function was normal.    The estimated ejection fraction was in the range of 55% to 60%.    Wall motion was normal; there were no regional wall motion    abnormalities. Features are consistent with a pseudonormal left    ventricular filling pattern, with concomitant abnormal relaxation    and increased filling pressure (grade 2 diastolic dysfunction).  Pertinent  Medical History  Anxiety/Depression Squamous cell carcinoma L inner thigh  HTN  Hx TIA  Etoh use  Cirrhosis  Pancreatic cysts   Home meds:  Lisinopril  Melatonin Metoprolol  Doxcycycline Folate, mag, MVI   Significant Hospital Events: Including procedures, antibiotic start and stop dates in addition to other pertinent events     Interim History / Subjective:    Objective  Blood pressure 94/73, pulse (!) 106, temperature 98 F (36.7 C), temperature source Oral, resp. rate (!) 22, height 5' (1.524 m), weight 48.8 kg, SpO2 93 %.        Intake/Output Summary (Last 24 hours) at 08/31/22 0128 Last data filed at 08/26/2022 1206 Gross per 24 hour  Intake 3 ml  Output 280 ml  Net -277 ml   Filed Weights   08/13/2022 1531 08/26/22 0450  Weight: 52.2 kg 48.8 kg    Examination: General: NAD, pleasant  HENT: ncat  Lungs: ctab  Cardiovascular: tachycardia sinus (110s)  Abdomen: NT, ND, NBS  Extremities:  warm,  Neuro: arousable, slightly drowsy,  GU:   Resolved Hospital Problem list     Assessment & Plan:  Tachycardia, mild hypoxemia:  Some evidence of pulm edema vs PNA on exam, possibly both.   CHF (diastolic) on prior echo.  Possible aspiration this evening.   Multiple labs pending (Cmp, cbc, cpk, lipase, procal, lactic, covid).  EKG pending.  Agree with unasyn.   No acute ICU needs now, but will monitor closely and will depend on labs.    GIB: cbc pending Will need GI consult.   Consider calling overnight if develops another episode.  Will order octreotide now.   Best Practice (right click and "Reselect all SmartList Selections" daily)    Labs   CBC: Recent Labs  Lab 09/01/2022 1525 08/21/2022 1807 08/25/22 0252 08/26/22 0444  WBC 13.1*  --  11.8* 7.2  NEUTROABS 10.0*  --   --  5.1  HGB 10.9* 8.2* 10.1* 10.5*  HCT 29.2* 24.0* 27.8* 29.3*  MCV 102.5*  --  104.5* 105.8*  PLT 247  --  269 [id number]    Basic Metabolic Panel: Recent Labs  Lab 08/25/22 1711 08/26/22 0048 08/26/22 0444 08/26/22 1048 08/26/22 1756  NA 120* 120* 124* 120* 121*  K 3.6 3.4* 3.6 3.4* 3.3*  CL 89* 87* 90* 88* 88*  CO2 17* 18* 16* 17* 17*  GLUCOSE 85 89 86 127* 98  BUN 32* 24* 22 21 20  $ CREATININE 0.94 0.81 0.84 0.90 0.78  CALCIUM 7.8* 7.7* 7.8* 7.7* 7.7*   GFR: Estimated Creatinine Clearance: 50.4 mL/min (by C-G formula based on SCr of 0.78 mg/dL). Recent Labs  Lab 08/11/2022 1425 08/26/2022 1525 08/21/2022 1658 08/28/2022 1915 08/21/2022 2149 08/25/22 0252 08/26/22 0444  WBC  --  13.1*  --   --   --  11.8* 7.2  LATICACIDVEN 4.1*  --  7.2* 2.5* 1.9  --   --     Liver Function Tests: Recent Labs  Lab 08/19/2022 1525 08/18/2022 1915 08/25/22 0252  AST 363* 370* 386*  ALT 142* 116* 118*  ALKPHOS 62 52 56  BILITOT 2.0* 1.5* 1.7*  PROT 6.1* 5.0* 5.2*  ALBUMIN 3.8 3.1* 3.3*   Recent Labs  Lab 08/12/2022 1525  LIPASE 57*   Recent Labs  Lab 08/20/2022 1426  AMMONIA 15    ABG     Component Value Date/Time   HCO3 11.9 (L) 08/13/2022 1807   TCO2 13 (L) 08/23/2022 1807   ACIDBASEDEF 12.0 (H) 08/15/2022 1807   O2SAT 71 09/06/2022 1807     Coagulation Profile: Recent Labs  Lab 08/17/2022 1525 08/25/22 0252  INR 1.4* 1.3*    Cardiac Enzymes: Recent Labs  Lab 08/19/2022 1525 08/25/22 0252  CKTOTAL 7,591* 6,090*    HbA1C: Hgb A1c MFr Bld  Date/Time Value Ref Range Status  08/26/2022 02:16 PM 4.3 (L) 4.8 - 5.6 %  Final    Comment:    (NOTE) Pre diabetes:          5.7%-6.4%  Diabetes:              >6.4%  Glycemic control for   <7.0% adults with diabetes   06/14/2015 03:20 AM 5.2 4.8 - 5.6 % Final    Comment:    (NOTE)         Pre-diabetes: 5.7 - 6.4         Diabetes: >6.4         Glycemic control for adults with diabetes: <7.0     CBG: No results for input(s): "GLUCAP" in the last 168 hours.  Review of Systems:     Past Medical History:  She,  has a past medical history of Anxiety, Cancer ([place]) (04/2014), Depression, Hard of hearing, Hypertension, Osteopenia, Substance abuse ([place]), and TIA (transient ischemic attack) (2016).   Surgical History:   Past Surgical History:  Procedure Laterality Date   AUGMENTATION MAMMAPLASTY Bilateral 1988   BREAST ENHANCEMENT SURGERY     BUNIONECTOMY     ENDOSCOPIC RETROGRADE CHOLANGIOPANCREATOGRAPHY (ERCP) WITH PROPOFOL N/A 07/28/2021   Procedure: ENDOSCOPIC RETROGRADE CHOLANGIOPANCREATOGRAPHY (ERCP) WITH PROPOFOL;  Surgeon: Milus Banister, MD;  Location: [facility] END[facility]COPY;  Service: Endoscopy;  Laterality: N/A;   GUM SURGERY  11/2017   NOSE SURGERY     Epistaxis   REMOVAL OF STONES  07/28/2021   Procedure: REMOVAL OF STONES;  Surgeon: Milus Banister, MD;  Location: [facility];  Service: Endoscopy;;   SPHINCTEROTOMY  07/28/2021   Procedure: Joan Mayans;  Surgeon: Milus Banister, MD;  Location: [facility];  Service: Endoscopy;;   TONSILLECTOMY       Social History:   reports that she has never  smoked. She has never been exposed to tobacco smoke. She has never used smokeless tobacco. She reports that she does not currently use alcohol after a past usage of about 4.0 - 6.0 standard drinks of alcohol per week. She reports that she does not use drugs.   Family History:  Her family history includes Alzheimer's disease in her mother; Hyperlipidemia in her father; Hypertension in her mother; Kidney disease in her mother; Rheum arthritis in her paternal grandmother; Stroke in her paternal grandmother. There is no history of Breast cancer, Colon cancer, Esophageal cancer, Rectal cancer, or Stomach cancer.   Allergies Allergies  Allergen Reactions   Codeine Other (See Comments)    REACTION: insomnia and anxious     Home Medications  Prior to Admission medications   Medication Sig Start Date End Date Taking? Authorizing Provider  lisinopril (ZESTRIL) 40 MG tablet TAKE 1 TABLET BY MOUTH EVERY DAY 07/28/22  Yes Lowne Chase, Yvonne R, DO  melatonin 5 MG TABS Take 1 tablet (5 mg total) by mouth at bedtime as needed. 04/08/21  Yes Antonieta Pert, MD  metoprolol succinate (TOPROL-XL) 100 MG 24 hr tablet Take 1 tablet (100 mg total) by mouth daily. Take with or immediately following a meal 06/26/22  Yes Ann Held, DO     Critical care time: 40 min.     Patient had additional episode of coffee ground emesis.  Remains tachypneic.  Intermittently low sat, needing more O2 supplementation.  Has not yet received PRBC or lasix.  Covid positive.   I will go ahead and tx to icu for closer monitoring.  May need intubation. Calling GI for urgent consult.

## 2022-09-08 NOTE — Death Summary Note (Signed)
  DEATH SUMMARY   Patient Details  Name: [name] MRN: [id number] DOB: December 24, 1955  Admission/Discharge Information   Admit Date:  2022/09/05  Date of Death: Date of Death: 09-08-2022  Time of Death: Time of Death: 68  Length of Stay: 3  Referring Physician: Carollee Herter, Alferd Apa, DO   Reason(s) for Hospitalization   Altered mental status Acute metabolic encephalopathy  Diagnoses  Preliminary cause of death:  GI bleed Hemorrhagic shock EtOH cirrhosis Liver failure Acute hypoxic respiratory failure due to GI bleed Acute metabolic encephalopathy Acute CVA, thalamic infarct Acute kidney injury DNR status, comfort care  Secondary Diagnoses (including complications and co-morbidities):  Principal Problem:   Hyponatremia Active Problems:   Essential hypertension   Alcoholic cirrhosis (HCC)   Alcohol abuse   Rhabdomyolysis   Elevated LFTs   AKI (acute kidney injury) ([place])   Hypothermia   Hematemesis   Esophageal necrosis   Brief Hospital Course (including significant findings, care, treatment, and services provided and events leading to death)  67 y.o.  female with history of alcoholic liver cirrhosis, pancreatic cyst, HTN-who was found down at home by EMS after a wellness check-she was found to have lactic acidosis, hyponatremia, rhabdomyolysis, elevated LFTs and subsequently admitted to the hospitalist service.  MRI was ordered and found to have a thalamic infarct.  Neurology consulted  On 2022-09-08 she developed coffee-ground emesis and was transferred to ICU.  She continued to have bloody emesis with altered mental status, inability to protect airway.  She was intubated and central line placed.  Started on pressors and GI consulted.  EGD showed findings of necrotic esophagus due to ischemic injury with active bleeding and hemostatic gel was applied.  She continued to deteriorate with ongoing shock, bleeding We had discussion with daughter and son who are at bedside and  they requested transition to comfort measures.  Signature:   Marshell Garfinkel MD [facility] See Amion for pager  If no response to pager , please call [phone] until 7pm After 7:00 pm call Elink  [phone] 08/31/2022, 3:47 PM

## 2022-09-08 NOTE — Progress Notes (Signed)
PCCM note  Discussed results of upper endoscopy with Dr. Candis Schatz.  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  Will transfuse 2 additional PRBCs Patient has increasing shock requiring up titration of Levophed Replace A-line  Discussed with daughter and son at bedside who have arrived to the ICU.  Per them there is no formal healthcare power of attorney papers designating her friend.  The children have agreed to DNR status.  If no improvement in 24 hours then consider comfort measures.  Additional CC time- 40 mins Marshell Garfinkel MD [facility] See Amion for pager  If no response to pager , please call [phone] until 7pm After 7:00 pm call Elink  [phone] 09/14/22, 4:02 PM

## 2022-09-08 NOTE — Progress Notes (Signed)
SLP Cancellation Note  Patient Details Name: TOCCORA LYNDAKER MRN: [id number] DOB: 1956-05-02   Cancelled treatment:       Reason Eval/Treat Not Completed: Medical issues which prohibited therapy (Patient now intubated. Signing off. Please re-consult when extubated and appropriate for po intake.)  Gabriel Rainwater MA, CCC-SLP  Quantavius Humm Meryl 09/12/22, 9:46 AM

## 2022-09-08 DEATH — deceased

## 2022-10-03 ENCOUNTER — Ambulatory Visit: Payer: Medicare Other | Admitting: Nurse Practitioner
